# Patient Record
Sex: Female | Born: 1945 | Race: Black or African American | Hispanic: No | Smoking: Former smoker
Health system: Southern US, Community
[De-identification: ages and names within clinical notes are randomized; demographics above are authoritative.]

## PROBLEM LIST (undated history)

## (undated) DIAGNOSIS — I10 Essential (primary) hypertension: Secondary | ICD-10-CM

## (undated) DIAGNOSIS — N189 Chronic kidney disease, unspecified: Secondary | ICD-10-CM

## (undated) DIAGNOSIS — D374 Neoplasm of uncertain behavior of colon: Secondary | ICD-10-CM

## (undated) DIAGNOSIS — E876 Hypokalemia: Secondary | ICD-10-CM

## (undated) DIAGNOSIS — M25512 Pain in left shoulder: Secondary | ICD-10-CM

## (undated) DIAGNOSIS — R239 Unspecified skin changes: Secondary | ICD-10-CM

## (undated) DIAGNOSIS — J302 Other seasonal allergic rhinitis: Secondary | ICD-10-CM

## (undated) DIAGNOSIS — K069 Disorder of gingiva and edentulous alveolar ridge, unspecified: Secondary | ICD-10-CM

## (undated) DIAGNOSIS — E785 Hyperlipidemia, unspecified: Secondary | ICD-10-CM

## (undated) DIAGNOSIS — I517 Cardiomegaly: Secondary | ICD-10-CM

## (undated) DIAGNOSIS — K439 Ventral hernia without obstruction or gangrene: Secondary | ICD-10-CM

## (undated) HISTORY — DX: Ventral hernia without obstruction or gangrene: K43.9

## (undated) HISTORY — DX: Other seasonal allergic rhinitis: J30.2

## (undated) HISTORY — DX: Cardiomegaly: I51.7

## (undated) HISTORY — DX: Hyperlipidemia, unspecified: E78.5

## (undated) HISTORY — PX: HEMORRHOID SURGERY: SHX153

## (undated) HISTORY — DX: Hypokalemia: E87.6

## (undated) HISTORY — PX: TONSILLECTOMY: SUR1361

## (undated) HISTORY — DX: Unspecified skin changes: R23.9

## (undated) HISTORY — PX: APPENDECTOMY: SHX54

## (undated) HISTORY — PX: TUBAL LIGATION: SHX77

## (undated) HISTORY — DX: Chronic kidney disease, unspecified: N18.9

## (undated) HISTORY — DX: Neoplasm of uncertain behavior of colon: D37.4

## (undated) HISTORY — DX: Essential (primary) hypertension: I10

---
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
## 1999-09-08 ENCOUNTER — Other Ambulatory Visit: Admission: RE | Admit: 1999-09-08 | Discharge: 1999-09-08 | Payer: Self-pay | Admitting: Internal Medicine

## 1999-10-27 ENCOUNTER — Ambulatory Visit (HOSPITAL_COMMUNITY): Admission: RE | Admit: 1999-10-27 | Discharge: 1999-10-27 | Payer: Self-pay | Admitting: Internal Medicine

## 1999-10-27 ENCOUNTER — Encounter: Payer: Self-pay | Admitting: Internal Medicine

## 2000-05-27 ENCOUNTER — Encounter: Payer: Self-pay | Admitting: Emergency Medicine

## 2000-05-27 ENCOUNTER — Emergency Department (HOSPITAL_COMMUNITY): Admission: EM | Admit: 2000-05-27 | Discharge: 2000-05-27 | Payer: Self-pay | Admitting: Emergency Medicine

## 2001-04-26 ENCOUNTER — Ambulatory Visit (HOSPITAL_COMMUNITY): Admission: RE | Admit: 2001-04-26 | Discharge: 2001-04-26 | Payer: Self-pay | Admitting: Internal Medicine

## 2001-04-26 ENCOUNTER — Encounter: Payer: Self-pay | Admitting: Internal Medicine

## 2001-05-03 ENCOUNTER — Encounter: Payer: Self-pay | Admitting: Internal Medicine

## 2001-05-03 ENCOUNTER — Encounter: Admission: RE | Admit: 2001-05-03 | Discharge: 2001-05-03 | Payer: Self-pay | Admitting: Internal Medicine

## 2001-06-01 ENCOUNTER — Ambulatory Visit (HOSPITAL_COMMUNITY): Admission: RE | Admit: 2001-06-01 | Discharge: 2001-06-01 | Payer: Self-pay | Admitting: Gastroenterology

## 2001-11-02 ENCOUNTER — Encounter: Admission: RE | Admit: 2001-11-02 | Discharge: 2001-11-02 | Payer: Self-pay | Admitting: Internal Medicine

## 2001-11-02 ENCOUNTER — Encounter: Payer: Self-pay | Admitting: Internal Medicine

## 2002-05-16 ENCOUNTER — Encounter: Admission: RE | Admit: 2002-05-16 | Discharge: 2002-05-16 | Payer: Self-pay | Admitting: Internal Medicine

## 2002-05-16 ENCOUNTER — Encounter: Payer: Self-pay | Admitting: Internal Medicine

## 2003-07-03 ENCOUNTER — Encounter: Admission: RE | Admit: 2003-07-03 | Discharge: 2003-07-03 | Payer: Self-pay | Admitting: Internal Medicine

## 2006-05-20 DIAGNOSIS — I517 Cardiomegaly: Secondary | ICD-10-CM | POA: Insufficient documentation

## 2006-05-20 HISTORY — DX: Cardiomegaly: I51.7

## 2006-05-28 ENCOUNTER — Ambulatory Visit: Payer: Self-pay | Admitting: Internal Medicine

## 2006-05-28 ENCOUNTER — Encounter (INDEPENDENT_AMBULATORY_CARE_PROVIDER_SITE_OTHER): Payer: Self-pay | Admitting: *Deleted

## 2006-05-28 ENCOUNTER — Ambulatory Visit (HOSPITAL_COMMUNITY): Admission: RE | Admit: 2006-05-28 | Discharge: 2006-05-28 | Payer: Self-pay | Admitting: Internal Medicine

## 2006-05-28 LAB — CONVERTED CEMR LAB
ALT: 15 units/L (ref 0–35)
BUN: 10 mg/dL (ref 6–23)
Free T4: 1.18 ng/dL (ref 0.89–1.80)
Glucose, Bld: 99 mg/dL (ref 70–99)
Hgb urine dipstick: NEGATIVE
Ketones, ur: 15 mg/dL — AB
Leukocyte count, blood: 4.7 10*9/L (ref 3.7–10.0)
MCV: 85.9 fL (ref 78.8–100.0)
Nitrite: NEGATIVE
Potassium: 3.7 meq/L (ref 3.5–5.3)
RBC: 4.76 M/uL (ref 3.79–4.96)
RDW: 14.4 % (ref 11.5–15.3)
TSH: 0.831 microintl units/mL (ref 0.350–5.50)
Total Protein: 6.9 g/dL (ref 6.0–8.3)
Urine Glucose: NEGATIVE mg/dL
Urobilinogen, UA: 0.2 (ref 0.0–1.0)

## 2006-06-02 ENCOUNTER — Encounter (INDEPENDENT_AMBULATORY_CARE_PROVIDER_SITE_OTHER): Payer: Self-pay | Admitting: *Deleted

## 2006-06-02 ENCOUNTER — Ambulatory Visit: Payer: Self-pay | Admitting: Hospitalist

## 2006-06-02 LAB — CONVERTED CEMR LAB
Total CHOL/HDL Ratio: 3.2
VLDL: 11 mg/dL (ref 0–40)

## 2006-06-04 DIAGNOSIS — I517 Cardiomegaly: Secondary | ICD-10-CM | POA: Insufficient documentation

## 2006-06-04 DIAGNOSIS — J301 Allergic rhinitis due to pollen: Secondary | ICD-10-CM | POA: Insufficient documentation

## 2006-06-04 DIAGNOSIS — I10 Essential (primary) hypertension: Secondary | ICD-10-CM | POA: Insufficient documentation

## 2006-06-14 ENCOUNTER — Ambulatory Visit: Payer: Self-pay | Admitting: Hospitalist

## 2006-06-14 ENCOUNTER — Encounter (INDEPENDENT_AMBULATORY_CARE_PROVIDER_SITE_OTHER): Payer: Self-pay | Admitting: *Deleted

## 2006-06-14 LAB — CONVERTED CEMR LAB
BUN: 12 mg/dL (ref 6–23)
Calcium: 9 mg/dL (ref 8.4–10.5)
Chloride: 103 meq/L (ref 96–112)
Creatinine, Ser: 1.1 mg/dL (ref 0.40–1.20)
Glucose, Bld: 112 mg/dL — ABNORMAL HIGH (ref 70–99)

## 2006-06-21 ENCOUNTER — Encounter (INDEPENDENT_AMBULATORY_CARE_PROVIDER_SITE_OTHER): Payer: Self-pay | Admitting: *Deleted

## 2006-06-21 ENCOUNTER — Ambulatory Visit: Payer: Self-pay | Admitting: Internal Medicine

## 2006-06-21 LAB — CONVERTED CEMR LAB
BUN: 16 mg/dL (ref 6–23)
Calcium: 8.8 mg/dL (ref 8.4–10.5)
Creatinine, Ser: 1.1 mg/dL (ref 0.40–1.20)
Glucose, Bld: 102 mg/dL — ABNORMAL HIGH (ref 70–99)
Sodium: 142 meq/L (ref 135–145)

## 2006-06-24 ENCOUNTER — Encounter: Admission: RE | Admit: 2006-06-24 | Discharge: 2006-06-24 | Payer: Self-pay | Admitting: *Deleted

## 2006-07-07 ENCOUNTER — Encounter (INDEPENDENT_AMBULATORY_CARE_PROVIDER_SITE_OTHER): Payer: Self-pay | Admitting: *Deleted

## 2006-07-07 ENCOUNTER — Ambulatory Visit: Payer: Self-pay | Admitting: *Deleted

## 2006-07-07 LAB — CONVERTED CEMR LAB: CO2: 32 meq/L (ref 19–32)

## 2006-07-26 ENCOUNTER — Encounter: Admission: RE | Admit: 2006-07-26 | Discharge: 2006-07-26 | Payer: Self-pay | Admitting: Gastroenterology

## 2006-08-03 ENCOUNTER — Telehealth: Payer: Self-pay | Admitting: *Deleted

## 2006-08-09 ENCOUNTER — Encounter: Payer: Self-pay | Admitting: *Deleted

## 2006-08-09 ENCOUNTER — Encounter (INDEPENDENT_AMBULATORY_CARE_PROVIDER_SITE_OTHER): Payer: Self-pay | Admitting: *Deleted

## 2006-08-09 ENCOUNTER — Ambulatory Visit: Payer: Self-pay | Admitting: Internal Medicine

## 2006-08-09 LAB — CONVERTED CEMR LAB
BUN: 11 mg/dL (ref 6–23)
Calcium: 9.1 mg/dL (ref 8.4–10.5)
Creatinine, Ser: 0.94 mg/dL (ref 0.40–1.20)
Glucose, Bld: 84 mg/dL (ref 70–99)
Potassium: 3.8 meq/L (ref 3.5–5.3)

## 2006-08-10 ENCOUNTER — Telehealth (INDEPENDENT_AMBULATORY_CARE_PROVIDER_SITE_OTHER): Payer: Self-pay | Admitting: *Deleted

## 2006-08-20 HISTORY — PX: RIGHT COLECTOMY: SHX853

## 2006-08-20 HISTORY — PX: COLON SURGERY: SHX602

## 2006-09-08 ENCOUNTER — Ambulatory Visit: Payer: Self-pay | Admitting: Internal Medicine

## 2006-09-09 ENCOUNTER — Encounter (INDEPENDENT_AMBULATORY_CARE_PROVIDER_SITE_OTHER): Payer: Self-pay | Admitting: Specialist

## 2006-09-09 ENCOUNTER — Inpatient Hospital Stay (HOSPITAL_COMMUNITY): Admission: RE | Admit: 2006-09-09 | Discharge: 2006-09-14 | Payer: Self-pay | Admitting: General Surgery

## 2006-09-09 DIAGNOSIS — Z860100 Personal history of colon polyps, unspecified: Secondary | ICD-10-CM | POA: Insufficient documentation

## 2006-09-09 DIAGNOSIS — Z9889 Other specified postprocedural states: Secondary | ICD-10-CM | POA: Insufficient documentation

## 2006-09-17 ENCOUNTER — Telehealth: Payer: Self-pay | Admitting: *Deleted

## 2006-09-27 ENCOUNTER — Encounter (INDEPENDENT_AMBULATORY_CARE_PROVIDER_SITE_OTHER): Payer: Self-pay | Admitting: *Deleted

## 2006-10-25 ENCOUNTER — Ambulatory Visit: Payer: Self-pay | Admitting: Internal Medicine

## 2006-10-25 ENCOUNTER — Encounter (INDEPENDENT_AMBULATORY_CARE_PROVIDER_SITE_OTHER): Payer: Self-pay | Admitting: *Deleted

## 2007-01-24 ENCOUNTER — Encounter (INDEPENDENT_AMBULATORY_CARE_PROVIDER_SITE_OTHER): Payer: Self-pay | Admitting: *Deleted

## 2007-01-24 ENCOUNTER — Ambulatory Visit: Payer: Self-pay | Admitting: Hospitalist

## 2007-01-24 LAB — CONVERTED CEMR LAB
CO2: 31 meq/L (ref 19–32)
Chloride: 99 meq/L (ref 96–112)
Creatinine, Ser: 0.95 mg/dL (ref 0.40–1.20)
Glucose, Bld: 107 mg/dL — ABNORMAL HIGH (ref 70–99)
Potassium: 3.5 meq/L (ref 3.5–5.3)

## 2007-06-24 ENCOUNTER — Encounter (INDEPENDENT_AMBULATORY_CARE_PROVIDER_SITE_OTHER): Payer: Self-pay | Admitting: *Deleted

## 2007-06-24 ENCOUNTER — Ambulatory Visit: Payer: Self-pay | Admitting: Hospitalist

## 2007-06-24 DIAGNOSIS — E785 Hyperlipidemia, unspecified: Secondary | ICD-10-CM

## 2007-06-24 DIAGNOSIS — E1169 Type 2 diabetes mellitus with other specified complication: Secondary | ICD-10-CM | POA: Insufficient documentation

## 2007-06-24 LAB — CONVERTED CEMR LAB
BUN: 22 mg/dL
CO2: 28 meq/L
Calcium: 9.2 mg/dL
Chloride: 103 meq/L
Creatinine, Ser: 1.08 mg/dL
Glucose, Bld: 89 mg/dL
Potassium: 3.8 meq/L
Sodium: 143 meq/L

## 2007-07-15 ENCOUNTER — Encounter: Admission: RE | Admit: 2007-07-15 | Discharge: 2007-07-15 | Payer: Self-pay | Admitting: Internal Medicine

## 2008-02-02 ENCOUNTER — Telehealth (INDEPENDENT_AMBULATORY_CARE_PROVIDER_SITE_OTHER): Payer: Self-pay | Admitting: *Deleted

## 2008-06-07 ENCOUNTER — Encounter (INDEPENDENT_AMBULATORY_CARE_PROVIDER_SITE_OTHER): Payer: Self-pay | Admitting: *Deleted

## 2008-06-07 ENCOUNTER — Ambulatory Visit: Payer: Self-pay | Admitting: Internal Medicine

## 2008-06-08 LAB — CONVERTED CEMR LAB
ALT: 12 units/L (ref 0–35)
Albumin: 4.4 g/dL (ref 3.5–5.2)
Alkaline Phosphatase: 70 units/L (ref 39–117)
CO2: 29 meq/L (ref 19–32)
Cholesterol: 204 mg/dL — ABNORMAL HIGH (ref 0–200)
HDL: 58 mg/dL (ref >39)
LDL Cholesterol: 127 mg/dL — ABNORMAL HIGH (ref 0–99)
Potassium: 4 meq/L (ref 3.5–5.3)
Triglycerides: 95 mg/dL (ref <150)

## 2008-06-28 ENCOUNTER — Encounter (INDEPENDENT_AMBULATORY_CARE_PROVIDER_SITE_OTHER): Payer: Self-pay | Admitting: *Deleted

## 2008-07-09 ENCOUNTER — Telehealth: Payer: Self-pay | Admitting: Infectious Diseases

## 2008-07-16 ENCOUNTER — Encounter: Admission: RE | Admit: 2008-07-16 | Discharge: 2008-07-16 | Payer: Self-pay | Admitting: *Deleted

## 2008-07-18 ENCOUNTER — Telehealth (INDEPENDENT_AMBULATORY_CARE_PROVIDER_SITE_OTHER): Payer: Self-pay | Admitting: *Deleted

## 2008-08-20 HISTORY — PX: VENTRAL HERNIA REPAIR: SHX424

## 2008-08-31 ENCOUNTER — Inpatient Hospital Stay (HOSPITAL_COMMUNITY): Admission: RE | Admit: 2008-08-31 | Discharge: 2008-09-03 | Payer: Self-pay | Admitting: General Surgery

## 2008-09-18 ENCOUNTER — Encounter (INDEPENDENT_AMBULATORY_CARE_PROVIDER_SITE_OTHER): Payer: Self-pay | Admitting: *Deleted

## 2008-09-26 DIAGNOSIS — K432 Incisional hernia without obstruction or gangrene: Secondary | ICD-10-CM | POA: Insufficient documentation

## 2008-10-16 ENCOUNTER — Encounter (INDEPENDENT_AMBULATORY_CARE_PROVIDER_SITE_OTHER): Payer: Self-pay | Admitting: *Deleted

## 2009-02-21 ENCOUNTER — Telehealth: Payer: Self-pay | Admitting: *Deleted

## 2009-03-27 ENCOUNTER — Telehealth (INDEPENDENT_AMBULATORY_CARE_PROVIDER_SITE_OTHER): Payer: Self-pay | Admitting: *Deleted

## 2009-04-10 ENCOUNTER — Ambulatory Visit: Payer: Self-pay | Admitting: Internal Medicine

## 2009-04-13 DIAGNOSIS — R944 Abnormal results of kidney function studies: Secondary | ICD-10-CM | POA: Insufficient documentation

## 2009-04-13 LAB — CONVERTED CEMR LAB
BUN: 20 mg/dL (ref 6–23)
CO2: 30 meq/L (ref 19–32)
Calcium: 10 mg/dL (ref 8.4–10.5)
Chloride: 101 meq/L (ref 96–112)
Cholesterol: 205 mg/dL — ABNORMAL HIGH (ref 0–200)
Creatinine, Ser: 1.34 mg/dL — ABNORMAL HIGH (ref 0.40–1.20)
Glucose, Bld: 167 mg/dL — ABNORMAL HIGH (ref 70–99)
Sodium: 143 meq/L (ref 135–145)
Triglycerides: 114 mg/dL (ref <150)
VLDL: 23 mg/dL (ref 0–40)

## 2009-04-18 ENCOUNTER — Ambulatory Visit: Payer: Self-pay | Admitting: Internal Medicine

## 2009-04-18 DIAGNOSIS — E119 Type 2 diabetes mellitus without complications: Secondary | ICD-10-CM | POA: Insufficient documentation

## 2009-04-18 LAB — CONVERTED CEMR LAB
CO2: 28 meq/L (ref 19–32)
Calcium: 8.9 mg/dL (ref 8.4–10.5)
Chloride: 101 meq/L (ref 96–112)
Creatinine, Ser: 1.13 mg/dL (ref 0.40–1.20)
Glucose, Bld: 201 mg/dL — ABNORMAL HIGH (ref 70–99)

## 2009-04-19 ENCOUNTER — Ambulatory Visit: Payer: Self-pay | Admitting: Internal Medicine

## 2009-04-19 LAB — CONVERTED CEMR LAB
Creatinine, Urine: 162.4 mg/dL
Microalb Creat Ratio: 58.7 mg/g — ABNORMAL HIGH (ref 0.0–30.0)
Microalb, Ur: 9.54 mg/dL — ABNORMAL HIGH (ref 0.00–1.89)

## 2009-05-10 ENCOUNTER — Ambulatory Visit: Payer: Self-pay | Admitting: Internal Medicine

## 2009-06-11 ENCOUNTER — Ambulatory Visit: Payer: Self-pay | Admitting: Infectious Disease

## 2009-06-11 LAB — CONVERTED CEMR LAB: Hgb A1c MFr Bld: 7.8 %

## 2009-06-12 LAB — CONVERTED CEMR LAB
AST: 15 units/L (ref 0–37)
Alkaline Phosphatase: 62 units/L (ref 39–117)
BUN: 18 mg/dL (ref 6–23)
CO2: 27 meq/L (ref 19–32)
Calcium: 9.4 mg/dL (ref 8.4–10.5)
Creatinine, Ser: 1.17 mg/dL (ref 0.40–1.20)
Glucose, Bld: 148 mg/dL — ABNORMAL HIGH (ref 70–99)
Potassium: 3.5 meq/L (ref 3.5–5.3)
Total Bilirubin: 0.3 mg/dL (ref 0.3–1.2)

## 2009-06-18 ENCOUNTER — Encounter (INDEPENDENT_AMBULATORY_CARE_PROVIDER_SITE_OTHER): Payer: Self-pay | Admitting: Dermatology

## 2009-06-19 ENCOUNTER — Encounter (INDEPENDENT_AMBULATORY_CARE_PROVIDER_SITE_OTHER): Payer: Self-pay | Admitting: Dermatology

## 2009-07-17 ENCOUNTER — Encounter: Admission: RE | Admit: 2009-07-17 | Discharge: 2009-07-17 | Payer: Self-pay | Admitting: Dermatology

## 2009-07-24 ENCOUNTER — Encounter: Admission: RE | Admit: 2009-07-24 | Discharge: 2009-07-24 | Payer: Self-pay | Admitting: Dermatology

## 2009-07-24 ENCOUNTER — Encounter (INDEPENDENT_AMBULATORY_CARE_PROVIDER_SITE_OTHER): Payer: Self-pay | Admitting: Dermatology

## 2009-08-01 ENCOUNTER — Encounter (INDEPENDENT_AMBULATORY_CARE_PROVIDER_SITE_OTHER): Payer: Self-pay | Admitting: Dermatology

## 2009-08-15 LAB — HM DIABETES EYE EXAM

## 2009-08-16 ENCOUNTER — Ambulatory Visit: Payer: Self-pay | Admitting: Internal Medicine

## 2009-08-16 LAB — CONVERTED CEMR LAB
ALT: 13 units/L (ref 0–35)
Alkaline Phosphatase: 76 units/L (ref 39–117)
Blood Glucose, Fingerstick: 115
CO2: 27 meq/L (ref 19–32)
Chloride: 97 meq/L (ref 96–112)
Creatinine, Ser: 1.14 mg/dL (ref 0.40–1.20)
Glucose, Bld: 127 mg/dL — ABNORMAL HIGH (ref 70–99)
Total Protein: 8 g/dL (ref 6.0–8.3)

## 2009-08-26 ENCOUNTER — Encounter (INDEPENDENT_AMBULATORY_CARE_PROVIDER_SITE_OTHER): Payer: Self-pay | Admitting: Dermatology

## 2009-08-26 ENCOUNTER — Ambulatory Visit: Payer: Self-pay | Admitting: Internal Medicine

## 2009-08-26 LAB — CONVERTED CEMR LAB: Blood Glucose, Home Monitor: 1 mg/dL

## 2009-08-27 LAB — CONVERTED CEMR LAB
Calcium: 9.7 mg/dL (ref 8.4–10.5)
Chloride: 100 meq/L (ref 96–112)
Sodium: 143 meq/L (ref 135–145)

## 2009-08-29 ENCOUNTER — Encounter (INDEPENDENT_AMBULATORY_CARE_PROVIDER_SITE_OTHER): Payer: Self-pay | Admitting: Dermatology

## 2009-09-02 ENCOUNTER — Encounter (INDEPENDENT_AMBULATORY_CARE_PROVIDER_SITE_OTHER): Payer: Self-pay | Admitting: Dermatology

## 2009-09-02 ENCOUNTER — Ambulatory Visit: Payer: Self-pay | Admitting: Internal Medicine

## 2009-09-02 LAB — CONVERTED CEMR LAB
CO2: 27 meq/L (ref 19–32)
Glucose, Bld: 153 mg/dL — ABNORMAL HIGH (ref 70–99)
Potassium: 3.9 meq/L (ref 3.5–5.3)

## 2009-09-11 ENCOUNTER — Ambulatory Visit: Payer: Self-pay | Admitting: Internal Medicine

## 2009-09-11 ENCOUNTER — Encounter (INDEPENDENT_AMBULATORY_CARE_PROVIDER_SITE_OTHER): Payer: Self-pay | Admitting: Dermatology

## 2009-09-13 LAB — CONVERTED CEMR LAB
Chloride: 102 meq/L (ref 96–112)
Creatinine, Ser: 1.15 mg/dL (ref 0.40–1.20)
Sodium: 143 meq/L (ref 135–145)

## 2009-09-24 ENCOUNTER — Encounter (INDEPENDENT_AMBULATORY_CARE_PROVIDER_SITE_OTHER): Payer: Self-pay | Admitting: Dermatology

## 2009-09-25 ENCOUNTER — Ambulatory Visit: Payer: Self-pay | Admitting: Internal Medicine

## 2009-10-16 ENCOUNTER — Encounter (INDEPENDENT_AMBULATORY_CARE_PROVIDER_SITE_OTHER): Payer: Self-pay | Admitting: Dermatology

## 2009-10-16 ENCOUNTER — Telehealth (INDEPENDENT_AMBULATORY_CARE_PROVIDER_SITE_OTHER): Payer: Self-pay | Admitting: *Deleted

## 2009-10-17 ENCOUNTER — Encounter (INDEPENDENT_AMBULATORY_CARE_PROVIDER_SITE_OTHER): Payer: Self-pay | Admitting: Dermatology

## 2009-11-04 ENCOUNTER — Encounter (INDEPENDENT_AMBULATORY_CARE_PROVIDER_SITE_OTHER): Payer: Self-pay | Admitting: Dermatology

## 2009-11-15 ENCOUNTER — Ambulatory Visit: Payer: Self-pay | Admitting: Internal Medicine

## 2009-11-15 LAB — CONVERTED CEMR LAB
Blood Glucose, Fingerstick: 78
Hgb A1c MFr Bld: 6.6 %

## 2009-12-24 ENCOUNTER — Ambulatory Visit: Payer: Self-pay | Admitting: Internal Medicine

## 2010-01-21 ENCOUNTER — Ambulatory Visit: Payer: Self-pay | Admitting: Internal Medicine

## 2010-01-23 ENCOUNTER — Ambulatory Visit: Payer: Self-pay | Admitting: Internal Medicine

## 2010-01-23 ENCOUNTER — Encounter: Payer: Self-pay | Admitting: Internal Medicine

## 2010-01-23 LAB — CONVERTED CEMR LAB
ALT: 15 units/L (ref 0–35)
CO2: 29 meq/L (ref 19–32)
Cholesterol: 134 mg/dL (ref 0–200)
Creatinine, Ser: 1.11 mg/dL (ref 0.40–1.20)
Glucose, Bld: 125 mg/dL — ABNORMAL HIGH (ref 70–99)
Potassium: 3.7 meq/L (ref 3.5–5.3)
Total CHOL/HDL Ratio: 2.3
Total Protein: 7.1 g/dL (ref 6.0–8.3)

## 2010-02-10 ENCOUNTER — Encounter: Payer: Self-pay | Admitting: Internal Medicine

## 2010-05-01 ENCOUNTER — Telehealth: Payer: Self-pay | Admitting: Internal Medicine

## 2010-05-01 ENCOUNTER — Ambulatory Visit: Payer: Self-pay | Admitting: Internal Medicine

## 2010-06-03 ENCOUNTER — Ambulatory Visit: Payer: Self-pay | Admitting: Internal Medicine

## 2010-06-03 LAB — HM DIABETES FOOT EXAM

## 2010-06-03 LAB — CONVERTED CEMR LAB: Blood Glucose, Fingerstick: 96

## 2010-06-06 ENCOUNTER — Encounter: Payer: Self-pay | Admitting: Internal Medicine

## 2010-07-18 ENCOUNTER — Encounter
Admission: RE | Admit: 2010-07-18 | Discharge: 2010-07-18 | Payer: Self-pay | Source: Home / Self Care | Attending: Internal Medicine | Admitting: Internal Medicine

## 2010-07-18 LAB — HM MAMMOGRAPHY

## 2010-08-06 LAB — HM DIABETES EYE EXAM

## 2010-08-21 NOTE — Letter (Signed)
Summary: BLOOD SUGAR   BLOOD SUGAR   Imported By: Garlan Fillers 12/25/2009 11:47:48  _____________________________________________________________________  External Attachment:    Type:   Image     Comment:   External Document

## 2010-08-21 NOTE — Miscellaneous (Signed)
Summary: [facility]: Diabetes Testing Supplies  [facility]: Diabetes Testing Supplies   Imported By: Bonner Puna 08/29/2009 16:03:20  _____________________________________________________________________  External Attachment:    Type:   Image     Comment:   External Document

## 2010-08-21 NOTE — Consult Note (Signed)
Summary: [facility]: Diabetic Eye Exam  [facility]: Diabetic Eye Exam   Imported By: Bonner Puna 08/15/2009 12:03:29  _____________________________________________________________________  External Attachment:    Type:   Image     Comment:   External Document  Appended Document: [facility]: Diabetic Eye Exam    Clinical Lists Changes  Observations: Added new observation of DIAB EYE EX: No diabetic retinopathy.   Visual acuity OD (best corrected):     20/25 Visual acuity OS (best corrected):     20/20 Intraocular pressure OD:     16 Intraocular pressure OS:     14  (08/15/2009 13:31)    Diabetic Eye Exam  Procedure date:  08/15/2009  Findings:      No diabetic retinopathy.   Visual acuity OD (best corrected):     20/25 Visual acuity OS (best corrected):     20/20 Intraocular pressure OD:     16 Intraocular pressure OS:     14

## 2010-08-21 NOTE — Assessment & Plan Note (Signed)
Summary: RA/NEEDS ROUTINE CHECKUP/CH   Vital Signs:  Patient profile:   65 year old female Height:      64 inches (162.56 cm) Weight:      188.2 pounds (85.55 kg) BMI:     32.42 Temp:     98.1 degrees F (36.72 degrees C) oral Pulse rate:   81 / minute BP sitting:   130 / 90  (right arm)  Vitals Entered By: Mateo Flow Deborra Medina) (January 21, 2010 1:55 PM) CC: 33mth f/u Is Patient Diabetic? Yes Did you bring your meter with you today? Yes Research Study Name: pt no longer checking regularly Pain Assessment Patient in pain? no      Nutritional Status BMI of > 30 = obese  Have you ever been in a relationship where you felt threatened, hurt or afraid?No   Does patient need assistance? Functional Status Self care Ambulation Normal   Diabetic Foot Exam Foot Inspection Is there a history of a foot ulcer?              No Is there a foot ulcer now?              No Can the patient see the bottom of their feet?          Yes Are the shoes appropriate in style and fit?          No Is there swelling or an abnormal foot shape?          No Are the toenails long?                No Are the toenails thick?                Yes Are the toenails ingrown?              No Is there heavy callous build-up?              No  Diabetic Foot Care Education Patient educated on appropriate care of diabetic feet.   High Risk Feet? No   10-g (5.07) Semmes-Weinstein Monofilament Test Performed by: Adline Peals          Right Foot          Left Foot Site 1         normal         normal Site 2         normal         normal Site 3         normal         normal Site 4         normal         normal Site 5         normal         normal Site 6         normal         normal  Impression      normal         normal   Visit Type:  ofc  Primary Care Provider:  Sheral Apley MD  CC:  74mth f/u.  History of Present Illness: Pt is a 66yo AAF with DM, HTN, and HL here for a routine 3 mo f/u. She was last  seen in clinic on 10/26/09 by Dr. Albin Fischer.          Preventive Screening-Counseling & Management  Alcohol-Tobacco     Alcohol drinks/day: 0  Smoking Status: quit     Year Started: 2001     Year Quit: 2001  Allergies: 1)  ! * Pineapple 2)  ! * White Gold  Physical Exam  General:  alert, well-developed, well-nourished, and well-hydrated.   Head:  normocephalic and atraumatic.   Eyes:  vision grossly intact, pupils equal, pupils round, and pupils reactive to light.   Ears:  no external deformities.   Nose:  no external deformity.   Neck:  no cervical lymphadenopathy.   Lungs:  normal respiratory effort, normal breath sounds, no crackles, and no wheezes.   Heart:  normal rate, regular rhythm, no murmur, no gallop, and no rub.   Abdomen:  soft, non-tender, normal bowel sounds, no distention, no guarding, and no rigidity.   Msk:  No deformity or scoliosis noted of thoracic or lumbar spine.   Pulses:  R and L carotid,radial,femoral,dorsalis pedis and posterior tibial pulses are full and equal bilaterally Extremities:  No clubbing, cyanosis, edema, or deformity noted with normal full range of motion of all joints.   Neurologic:  No cranial nerve deficits noted. Station and gait are normal. Plantar reflexes are down-going bilaterally. DTRs are symmetrical throughout. Sensory, motor and coordinative functions appear intact. Psych:  Cognition and judgment appear intact. Alert and cooperative with normal attention span and concentration. No apparent delusions, illusions, hallucinations  Diabetes Management Exam:    Foot Exam (with socks and/or shoes not present):       Sensory-Monofilament:          Left foot: normal          Right foot: normal   Impression & Recommendations:  Problem # 1:  DM (ICD-250.00)  DM: She is not on any medications currently and has opted instead to try lifestyle modifications. She is making significant progress with her diet and exercise regimen.  She  has been checking her blood glucose levels once per week usually in the mornings after exercise and getting values between 100-130.    Her updated medication list for this problem includes:    Lisinopril 40 Mg Tabs (Lisinopril) .Marland Kitchen... Take 1 tablet by mouth once a day    Aspirin 81 Mg Tbec (Aspirin) .Marland Kitchen... Take 1 tablet by mouth once a day  Future Orders: T-Comprehensive Metabolic Panel ([id number]) ... 01/23/2010  Labs Reviewed: Creat: 1.15 (09/11/2009)     Last Eye Exam: No diabetic retinopathy.   Visual acuity OD (best corrected):     20/25 Visual acuity OS (best corrected):     20/20 Intraocular pressure OD:     16 Intraocular pressure OS:     14  (08/15/2009) Reviewed HgBA1c results: 6.6 (11/15/2009)  7.8 (08/16/2009)  Problem # 2:  HYPERTENSION (ICD-401.9)  HTN: This is well controlled on HCTZ. She continues to be compliant with her meds with no complaints of blurry vision or LE swelling.  Her updated medication list for this problem includes:    Hydrochlorothiazide 25 Mg Tabs (Hydrochlorothiazide) .Marland Kitchen... Take 1 tablet by mouth once a day    Lisinopril 40 Mg Tabs (Lisinopril) .Marland Kitchen... Take 1 tablet by mouth once a day    Norvasc 10 Mg Tabs (Amlodipine besylate) .Marland Kitchen... Take 1 tablet by mouth once a day  BP today: 130/90 Prior BP: 128/81 (11/15/2009)  Prior 10 Yr Risk Heart Disease: 9 % (09/08/2006)  Labs Reviewed: K+: 3.6 (09/11/2009) Creat: : 1.15 (09/11/2009)   Chol: 205 (04/10/2009)   HDL: 49 (04/10/2009)   LDL: 133 (04/10/2009)   TG: 114 (  04/10/2009)  Problem # 3:  HYPERLIPIDEMIA (ICD-272.2)  HL: Well controlled on Simvastatin.  Her updated medication list for this problem includes:    Simvastatin 20 Mg Tabs (Simvastatin) .Marland Kitchen... Please take one tablet by mouth each evening.  Future Orders: T-Lipid Profile [phone]) ... 01/29/2010  Labs Reviewed: SGOT: 14 (08/16/2009)   SGPT: 13 (08/16/2009)  Prior 10 Yr Risk Heart Disease: 9 % (09/08/2006)   HDL:49  (04/10/2009), 58 (06/07/2008)  LDL:133 (04/10/2009), 127 (06/07/2008)  Chol:205 (04/10/2009), 204 (06/07/2008)  Trig:114 (04/10/2009), 95 (06/07/2008)  Problem # 4:  NONSPECIFIC ABNORM RESULTS KIDNEY FUNCTION STUDY (ICD-794.4) Renal Insufficiency: per records, patient seemed to have had occassional creatinine elevations from baseline.  Complete Medication List: 1)  Hydrochlorothiazide 25 Mg Tabs (Hydrochlorothiazide) .... Take 1 tablet by mouth once a day 2)  Lisinopril 40 Mg Tabs (Lisinopril) .... Take 1 tablet by mouth once a day 3)  Norvasc 10 Mg Tabs (Amlodipine besylate) .... Take 1 tablet by mouth once a day 4)  Align Caps (Misc intestinal flora regulat) .... Take 1 tablet by mouth once a day 5)  Debrox 6.5 % Soln (Carbamide peroxide) .... Use in ears as needed for earwax buildup. 6)  Fexofenadine Hcl 180 Mg Tabs (Fexofenadine hcl) .... Take 1 tablet by mouth once a day 7)  Simvastatin 20 Mg Tabs (Simvastatin) .... Please take one tablet by mouth each evening. 8)  Estate manager/land agent Strp (Glucose blood) .... Use to check blood sugar as needed to learn how foods and activity affect blood sugar. 9)  Lancets 30g Misc (Lancets) .... Use to check blood sugar as needed. 10)  Aspirin 81 Mg Tbec (Aspirin) .... Take 1 tablet by mouth once a day  Patient Instructions: 1)  Please continue with your diet and exercise regimen. Pls make an appointment to check your fasting labs. Also pls make a followup clinic appointment in 3 months. 2)  Call with any questions. Process Orders Check Orders Results:     [facility]: Check successful Tests Sent for requisitioning (January 28, 2010 10:20 AM):     01/23/2010: [facility] -- T-Comprehensive Metabolic Panel [id number] (signed)     01/29/2010: [facility] -- T-Lipid Profile [phone] (signed)    Prevention & Chronic Care Immunizations   Influenza vaccine: Fluvax Non-MCR  (04/10/2009)    Tetanus  booster: Not documented   Td booster deferral: Deferred  (08/16/2009)    Pneumococcal vaccine: Pneumovax  (06/07/2008)    H. zoster vaccine: Not documented   H. zoster vaccine deferral: Deferred  (04/18/2009)  Colorectal Screening   Hemoccult: Not documented   Hemoccult action/deferral: Deferred  (04/18/2009)    Colonoscopy: Not documented   Colonoscopy action/deferral: Deferred  (08/16/2009)  Other Screening   Pap smear: Not documented   Pap smear action/deferral: Deferred  (08/16/2009)    Mammogram: Repeat of Right breast: The mammographic nodule corresponds to a simple cyst.  No     mammographic or sonographic evidence of malignancy.  Yearly     screening mammography is suggested.  (07/24/2009)   Mammogram action/deferral: Ordered  (04/10/2009)   Mammogram due: 07/16/2009    DXA bone density scan: Not documented   DXA bone density action/deferral: Deferred  (04/18/2009)   Smoking status: quit  (01/21/2010)  Diabetes Mellitus   HgbA1C: 6.6  (11/15/2009)    Eye exam: No diabetic retinopathy.   Visual acuity OD (best corrected):     20/25 Visual acuity OS (best corrected):     20/20 Intraocular  pressure OD:     16 Intraocular pressure OS:     14   (08/15/2009)   Eye exam due: 08/15/2010    Foot exam: yes  (01/21/2010)   Foot exam action/deferral: Do today   High risk foot: No  (01/21/2010)   Foot care education: Done  (01/21/2010)    Urine microalbumin/creatinine ratio: 58.7  (04/18/2009)   Urine microalbumin action/deferral: Ordered    Diabetes flowsheet reviewed?: Yes   Progress toward A1C goal: At goal  Lipids   Total Cholesterol: 205  (04/10/2009)   Lipid panel action/deferral: Deferred   LDL: 133  (04/10/2009)   LDL Direct: Not documented   HDL: 49  (04/10/2009)   Triglycerides: 114  (04/10/2009)    SGOT (AST): 14  (08/16/2009)   SGPT (ALT): 13  (08/16/2009) CMP ordered    Alkaline phosphatase: 76  (08/16/2009)   Total bilirubin: 0.4   (08/16/2009)  Hypertension   Last Blood Pressure: 130 / 90  (01/21/2010)   Serum creatinine: 1.15  (09/11/2009)   Serum potassium 3.6  (09/11/2009) CMP ordered   Self-Management Support :   Personal Goals (by the next clinic visit) :     Personal A1C goal: 7  (04/18/2009)     Personal blood pressure goal: 130/80  (04/18/2009)     Personal LDL goal: 100  (04/18/2009)    Patient will work on the following items until the next clinic visit to reach self-care goals:     Medications and monitoring: take my medicines every day  (01/21/2010)     Eating: eat foods that are low in salt, eat baked foods instead of fried foods  (01/21/2010)     Activity: take a 30 minute walk every day  (11/15/2009)     Other: continue with your walking program  (01/21/2010)    Diabetes self-management support: Copy of home glucose meter record, Written self-care plan, Pre-printed educational material, Resources for patients handout  (01/21/2010)   Diabetes care plan printed   Last diabetes self-management training by diabetes educator: 12/24/2009    Hypertension self-management support: Pre-printed educational material, Resources for patients handout, Written self-care plan  (01/21/2010)   Hypertension self-care plan printed.    Lipid self-management support: Pre-printed educational material, Resources for patients handout, Written self-care plan  (01/21/2010)   Lipid self-care plan printed.      Resource handout printed.   Nursing Instructions: Diabetic foot exam today

## 2010-08-21 NOTE — Progress Notes (Signed)
Summary: med refill/gp  Phone Note Refill Request Message from:  Patient on May 01, 2010 1:51 PM  Refills Requested: Medication #1:  HYDROCHLOROTHIAZIDE 25 MG TABS Take 1 tablet by mouth once a day  Medication #2:  LISINOPRIL 40 MG TABS Take 1 tablet by mouth once a day  Medication #3:  NORVASC 10 MG  TABS Take 1 tablet by mouth once a day Last appt 7/5  w/labs 7/7.  Next appt. 11/15.   Method Requested: Telephone to Pharmacy Initial call taken by: Morrison Old RN,  May 01, 2010 1:51 PM  Follow-up for Phone Call        Has F/U appt scheduled. BP mod control Follow-up by: Larey Dresser MD,  May 01, 2010 1:59 PM    Prescriptions: NORVASC 10 MG  TABS (AMLODIPINE BESYLATE) Take 1 tablet by mouth once a day  #30 x 5   Entered and Authorized by:   Larey Dresser MD   Signed by:   Larey Dresser MD on 05/01/2010   Method used:   Faxed to ...       [facility] (mail-order)             , [place]         Ph:        Fax: [phone]   [id number] LISINOPRIL 40 MG TABS (LISINOPRIL) Take 1 tablet by mouth once a day  #30 x 5   Entered and Authorized by:   Larey Dresser MD   Signed by:   Larey Dresser MD on 05/01/2010   Method used:   Faxed to ...       [facility] (mail-order)             , [place]         Ph:        Fax: [phone]   [id number]:[id number] HYDROCHLOROTHIAZIDE 25 MG TABS (HYDROCHLOROTHIAZIDE) Take 1 tablet by mouth once a day  #31 x 5   Entered and Authorized by:   Larey Dresser MD   Signed by:   Larey Dresser MD on 05/01/2010   Method used:   Faxed to ...       [facility] (mail-order)             , [place]         Ph:        Fax: [phone]   RxID:   [id number]

## 2010-08-21 NOTE — Letter (Signed)
Summary: [facility] BLOOD GLU[facility]SE LOG  [facility]   Imported By: Garlan Fillers 11/27/2009 15:28:46  _____________________________________________________________________  External Attachment:    Type:   Image     Comment:   External Document

## 2010-08-21 NOTE — Assessment & Plan Note (Signed)
Summary: DM TRAINING/VS   Allergies: 1)  ! * Pineapple 2)  ! * White Gold   Complete Medication List: 1)  Hydrochlorothiazide 25 Mg Tabs (Hydrochlorothiazide) .... Take 1 tablet by mouth once a day 2)  Lisinopril 40 Mg Tabs (Lisinopril) .... Take 1 tablet by mouth once a day 3)  Norvasc 10 Mg Tabs (Amlodipine besylate) .... Take 1 tablet by mouth once a day 4)  Align Caps (Misc intestinal flora regulat) .... Take 1 tablet by mouth once a day 5)  Debrox 6.5 % Soln (Carbamide peroxide) .... Use in ears as needed for earwax buildup. 6)  Fexofenadine Hcl 180 Mg Tabs (Fexofenadine hcl) .... Take 1 tablet by mouth once a day 7)  Simvastatin 20 Mg Tabs (Simvastatin) .... Please take one tablet by mouth each evening. 8)  Estate manager/land agent Strp (Glucose blood) .... Use to check blood sugar as needed to learn how foods and activity affect blood sugar. 9)  Lancets 30g Misc (Lancets) .... Use to check blood sugar as needed. 10)  Aspirin 81 Mg Tbec (Aspirin) .... Take 1 tablet by mouth once a day  Other Orders: MNT/Reassessment and Intervention, Individual, 15 Minutes [phone])  Patient Instructions: 1)  you said your goal weight is 140-145# Long term 2)  5 pounds  every 3 months= 20 pounds a year!!! 3)  You are doing a great job caring for yourself nad your diabetes! 4)  Next A1C is due late -after July 29th, 2011. 5)  Please make a follow up for your diabetes self management training for September 2011.   Diabetes Self Management Training  PCP: Harriett Sine MD Referring MD: Cathren Laine Date diagnosed with diabetes: 04/18/2009 Diabetes Type: Type 2 non-insulin Other persons present: yes Current smoking Status: quit  Diabetes Medications:  Comments: meter downloaded- see scanned report. CBGs continued to be in target range off medication. CBG 100 today in office.     Monitoring Self monitoring blood glucose 1 time a day Name of Meter  usb conbtour self test  Other Assessment Had an  amputation due to diabetes? No Ever had a foot ulcer or infection?           No Performs daily self-foot exams: Yes   Carrys Food for Low Blood sugar Yes Can you tell if your blood sugar is low? Yes   Diabetes Disease Process  Discussed today Define diabetes in simple terms: Demonstrates competency    Medications  Nutritional Management Identify what foods most often affect blood glucose: Demonstrates competency Monitoring State purpose and frequency of monitoring BG-ketones-HgbA1C  : Demonstrates competencyPerform glucose monitoring/ketone testing and record results correctly: Demonstrates competencyState target blood glucose and HgbA1C goals: Demonstrates competency Complications State the causes-signs and symptoms and prevention of Hyperglycemia: Demonstrates competency   Explain proper treatment of hyperglycemia: Demonstrates competency   State the causes- signs and symptoms and prevention of hypoglycemia: Not applicable   Explain proper treatment of hypoglycemia: Not applicable   Glucose control and the development/prevention of long-term complications: Demonstrates competencyState benefits-risks-and options for improving blood sugar control: Demonstrates competencyB/P and lipid control in the prevention/control of cardiovascular disease: Demonstrates competencyState the principles of skin-dental and foot care: Demonstrates competencyDescribe symptoms of skin and foot problems and describe foot exam: Demonstrates competencyState when to seek medical advice and treatment: Demonstrates competency Exercise  Lifestyle changes:Goal setting and Problem solving State benefits of making appropriate lifestyle changes: Demonstrates competencyIdentify lifestyle behaviors that need to change: Demonstrates competencyVerbalize need for and frequency of health care  follow-up: Demonstrates competency Psychosocial Adjustment Name two ways of obtaining support from family/friends: Demonstrates  competencyDiabetes Management Education Done: 12/24/2009        MEDICAL NUTRITION THERAPY  Assessment: patient target weight is 140-145#. she verbalizes knowledge and confidence in her ability to continue weight loss.Thinking about joining an exercise club/program for group classes.   [id number] is continued downward  Medications:aspirin, simvastatin Exercise: 5-6 times a week for 30 minutes or more Labs:A1C 6.6%   Diagnosis:  NB 2.1- physical inactivity resolved. NB 53.3 - inappropriate intake of types of carbohydrate resolved NB 1.4 self monitoring deifict significanlty improved as evidenced by meter download- see scanned report. Duran 2.2- altered nutrition related laboratory values as related to increased risk of heart diseas and stroke as evidenced by 03/2009 LDL value of 133.    Intervention:  1- Discussed using weight and A1C with periodic self monitoring as needed for symptons as means of self monitoring.  2- Counseled on causes, sign and symptoms of hyperglycemia and how to prevent and treat. 3- meter download, food records and goals reveiwed. 4- Information on Silver Sneakers provided 5- follow-up on repeat lipids in fall.  Monitoring:verbalizing good understanding of how to eat low fat and lower carb, weight improved Evaluation:A1C at goal, weight continues to fall at good rate: 1/2-1 pound/week  Diabetes Self Management Support: family ,CDE and clinic appointments Follow-up:3 months for yearly, then eveyr 6 -12 months   Appended Document: DM TRAINING/VS     Vital Signs:  Patient profile:   65 year old female Weight:      182.9 pounds BMI:     31.51  Allergies: 1)  ! * Pineapple 2)  ! * White Gold

## 2010-08-21 NOTE — Progress Notes (Signed)
Summary: diabetes support & testing /dmr  Phone Note Outgoing Call   Call placed by: Barnabas Harries RD,CDE,  October 16, 2009 11:30 AM Summary of Call: called patient to confirm number of times she is testing to fax mail order form for her to get supplies. she has been out of strips due to increased testing frequency. says she test 2x/day to see how her food affects her blood sugar. requests list of carb content of begetables and meats- Tired of [place] and chicken and eating a lot of vegetables. Also reveiwed number of carbs./day she should aim for  ~150grams- 50 grams per meaL. information mailed ot her about carbs.    New/Updated Medications: BAYER CONTOUR TEST  STRP (GLUCOSE BLOOD) use to check blood sugar 2  times daily before and 2 hours after meals LANCETS 30G  MISC (LANCETS) use to check blood sugar 2  times daily

## 2010-08-21 NOTE — Letter (Signed)
Summary: CONTOUR METER DOWNLOAD  CONTOUR METER DOWNLOAD   Imported By: Enedina Finner 02/10/2010 15:41:18  _____________________________________________________________________  External Attachment:    Type:   Image     Comment:   External Document

## 2010-08-21 NOTE — Assessment & Plan Note (Signed)
Summary: lab visit   Process Orders Check Orders Results:     [facility]: Check successful Tests Sent for requisitioning (September 03, 2009 11:30 AM):     09/02/2009: [facility] -- T-Basic Metabolic Panel 0000000 (signed)     Impression & Recommendations:  Problem # 1:  NONSPECIFIC ABNORM RESULTS KIDNEY FUNCTION STUDY (ICD-794.4) Renal function returned to baseline. Will have her restart HCTZ and Lisinopril and return for Bmet in 1-2 weeks to make sure Creatinine still at baseline.   Orders: T-Basic Metabolic Panel ([id number])  Problem # 2:  DM (ICD-250.00) At last visit I had discussed starting this patient on metformin. Now that her Cr has hit 1.3 twice now in the past few months, I am reluctant to use metformin and will start low dose glipizide instead.   The following medications were removed from the medication list:    Metformin Hcl 500 Mg Tabs (Metformin hcl) .Marland Kitchen... Take 1 tablet by mouth twice a day Her updated medication list for this problem includes:    Lisinopril 40 Mg Tabs (Lisinopril) .Marland Kitchen... Take 1 tablet by mouth once a day    Glipizide 5 Mg Tabs (Glipizide) .Marland Kitchen... Take 1 tablet by mouth once a day with a meal. make sure you take it with a meal.  Complete Medication List: 1)  Hydrochlorothiazide 25 Mg Tabs (Hydrochlorothiazide) .... Take 1 tablet by mouth once a day 2)  Lisinopril 40 Mg Tabs (Lisinopril) .... Take 1 tablet by mouth once a day 3)  Norvasc 10 Mg Tabs (Amlodipine besylate) .... Take 1 tablet by mouth once a day 4)  Align Caps (Misc intestinal flora regulat) .... Take 1 tablet by mouth once a day 5)  Debrox 6.5 % Soln (Carbamide peroxide) .... Use in ears as needed for earwax buildup. 6)  Fexofenadine Hcl 180 Mg Tabs (Fexofenadine hcl) .... Take 1 tablet by mouth once a day 7)  Simvastatin 20 Mg Tabs (Simvastatin) .... Please take one tablet by mouth each evening. 8)  Glipizide 5 Mg Tabs (Glipizide) .... Take 1  tablet by mouth once a day with a meal. make sure you take it with a meal.  Allergies: 1)  ! * Pineapple 2)  ! * White Gold  Prescriptions: GLIPIZIDE 5 MG TABS (GLIPIZIDE) Take 1 tablet by mouth once a day with a meal. Make sure you take it with a meal.  #31 x 3   Entered by:   Harriett Sine MD   Authorized by:   Gevena Cotton RN   Signed by:   Harriett Sine MD on 09/03/2009   Method used:   Faxed to ...       [facility] (mail-order)             , [place]         Ph:        Fax: [phone]   [id number]   Appended Document: lab visit    Clinical Lists Changes  Medications: Rx of GLIPIZIDE 5 MG TABS (GLIPIZIDE) Take 1 tablet by mouth once a day with a meal. Make sure you take it with a meal.;  #31 x 3;  Signed;  Entered by: Harriett Sine MD;  Authorized by: Harriett Sine MD;  Method used: Faxed to [place], , , [place]  , Ph: , Fax: [phone]    Prescriptions: GLIPIZIDE 5 MG TABS (GLIPIZIDE) Take 1 tablet by mouth once a day with a meal. Make sure you take it with a  meal.  #31 x 3   Entered and Authorized by:   Harriett Sine MD   Signed by:   Harriett Sine MD on 09/03/2009   Method used:   Faxed to ...       [facility] (mail-order)             , [place]         Ph:        Fax: [phone]   RxID:   [id number]

## 2010-08-21 NOTE — Assessment & Plan Note (Signed)
Summary: diabetes teaching/cfb   Allergies: 1)  ! * Pineapple 2)  ! * White Gold   Complete Medication List: 1)  Hydrochlorothiazide 25 Mg Tabs (Hydrochlorothiazide) .... Take 1 tablet by mouth once a day 2)  Lisinopril 40 Mg Tabs (Lisinopril) .... Take 1 tablet by mouth once a day 3)  Norvasc 10 Mg Tabs (Amlodipine besylate) .... Take 1 tablet by mouth once a day 4)  Align Caps (Misc intestinal flora regulat) .... Take 1 tablet by mouth once a day 5)  Debrox 6.5 % Soln (Carbamide peroxide) .... Use in ears as needed for earwax buildup. 6)  Fexofenadine Hcl 180 Mg Tabs (Fexofenadine hcl) .... Take 1 tablet by mouth once a day 7)  Simvastatin 20 Mg Tabs (Simvastatin) .... Please take one tablet by mouth each evening.  Other Orders: DSMT(Medicare) Individual, 30 Minutes (G0108)  Diabetes Self Management Training  PCP: Harriett Sine MD Referring MD: Cathren Laine Date diagnosed with diabetes: 04/18/2009 Diabetes Type: Type 2 non-insulin Other persons present: no Current smoking Status: quit  Diabetes Medications:  Comments: discussed self monitoirng with patient. she was hesitant. disucssed that supplies should be coverred at 100% and she could collect infomration that might help her make decisions about what foods to eat, see how exercise affects CBgs. patietn chose Contour USB meter adn will get supplies through [facility]. referral faxed today. asked her to test 6-7 times for 3 days then call me with results    Monitoring Self monitoring blood glucose 1 time a day Name of Meter  usb conbtour self test  Time of Testing  Before meals  After meals  Recent Episodes of: Requiring Help from another person  Hyperglycemia : Yes Hypoglycemia: No Severe Hypoglycemia : No     Diabetes Disease Process  Discussed today  Medications  Nutritional Management  Monitoring State purpose and frequency of monitoring BG-ketones-HgbA1C  : Needs review/assistance   Perform glucose  monitoring/ketone testing and record results correctly: Needs review/assistance    State target blood glucose and HgbA1C goals: Needs review/assistance   Diabetes Management Education Done: 08/26/2009    BEHAVIORAL GOALS INITIAL Monitoring blood glucose levels daily: test CBg before and 2 hours afte meals for 3 days and then call office        MEDICAL NUTRITION THERAPY  Assessment: has decreased to 1% milk, met her goal 100% of not eating poultry skin, 25% onusing tub margarine instead of stick  butter, weight has decreased another pound in the past 2 months. Not waking every day.  [id number] is continued downward  Medications:none for diabetes, simvastatin Blood sugars:A1C still hihg despite additional dietary and continued modest weight change 24 hour recall: cheerios with 1% milk @ 5:30 am, 1 egg nad wheat toast @ 9:30 am, 1 Pm- sadwich or baked fish, brown rice, cabbage, honey in her tea x 2 tsp, dinner 5:30 Pm- roast beef, broccoli,mashed potatoes and tea again with honey as sweetner. Exercise:says because of snow and ice didn;t get out as much as she'd like- 3- 5 times a week for 30 minutes Labs:note continued high A1C   Diagnosis:  NB 2.1- physical activity improving. need to reassess patient desire to increase her physical activty to meet her goal of not using medications to control her blood sugars  NB 53.3 - improvong, but may need further detailed records to determine if further change neededor desired  NB1.4 self monitroing deifict as related to lack of awareness of problem that is preventing her from reachin gher goals as  evidenced by her high A1C today and continueing to state she wants improved control and not to take medications.    Intervention:  1-Recommended self monitoring of blood sugars at home to detect when sugar is higher than target 2- Counseled on self monitoring 3- Counseled on sweetners to assit wiht lowering carbs  Monitoring:understanding of how to  eat low fat and lower carb, weight Evaluation:A1C, weight trend  Diabetes Self Management Support: family ,CDE and clinic appointments Follow-up:had recommended 3 months for monitoirng of MNT however in view of continued elevated A1C receommend return sooner for DSMT for self monitoring.

## 2010-08-21 NOTE — Assessment & Plan Note (Signed)
Summary: DM TRAINING/VS   Vital Signs:  Patient profile:   65 year old female Weight:      188.3 pounds BMI:     32.44 Is Patient Diabetic? Yes Did you bring your meter with you today? Yes   Allergies: 1)  ! * Pineapple 2)  ! * White Gold   Complete Medication List: 1)  Hydrochlorothiazide 25 Mg Tabs (Hydrochlorothiazide) .... Take 1 tablet by mouth once a day 2)  Lisinopril 40 Mg Tabs (Lisinopril) .... Take 1 tablet by mouth once a day 3)  Norvasc 10 Mg Tabs (Amlodipine besylate) .... Take 1 tablet by mouth once a day 4)  Align Caps (Misc intestinal flora regulat) .... Take 1 tablet by mouth once a day 5)  Debrox 6.5 % Soln (Carbamide peroxide) .... Use in ears as needed for earwax buildup. 6)  Fexofenadine Hcl 180 Mg Tabs (Fexofenadine hcl) .... Take 1 tablet by mouth once a day 7)  Simvastatin 20 Mg Tabs (Simvastatin) .... Please take one tablet by mouth each evening. 8)  Glipizide 5 Mg Tabs (Glipizide) .... Take 1 tablet by mouth once a day with a meal. make sure you take it with a meal. 9)  Bayer Contour Test Strp (Glucose blood) .... Use to check blood sugar 3-4 times daily before and 2 hours after meals 10)  Lancets 30g Misc (Lancets) .... Use to check blood sugar 3-4 times daily  Other Orders: DSMT(Medicare) Individual, 30 Minutes (G0108) MNT/Reassessment and Intervention, Individual, 15 Minutes [phone])  Patient Instructions: 1)  follow up in 3 months with [name] 2)  Watch for symptoms of low blood sugar 3)  If more than 2-3 low blood sugar ( < 0) in a week and you don't know what caused them, please give [place] a call- your diabetes medication dose may need to be adjusted 4)  [id number]  Diabetes Self Management Training  PCP: Harriett Sine MD Referring MD: Cathren Laine Date diagnosed with diabetes: 04/18/2009 Diabetes Type: Type 2 non-insulin Other persons present: no Current smoking Status: quit  Vital Signs Todays Weight: 188.3lb  in BMI 32.44in-lbs    Assessment Daily activities: active with five grandchildren since retired from [employer] in [place] for 30 years) last spring after abdominal surgery. Stopped all other activitiesd because doctors told her to start back slow.  Sources of Support: family  Diabetes Medications:  Comments: brought meter and food and blood sugar records- is testing more often now to see how food and actiivty affect blood sugar, desires post meal bloodsugar to be less than 140 mg/dl.over the past month  minimum blood sugar was 67- maximun has been 228testing 3.3 times a daybefore meal average is 104 and after meal, 90-95% in target range. weight continues to fall.      Diabetes Management Education Done: 09/25/2009    BEHAVIORAL GOAL FOLLOW UP  Goal attained  Goal attained  Goal attained Preventing,detecting and treating complications: 0000000 the time  Goal attained      MEDICAL NUTRITION THERAPY  Assessment:   [id number] is continued downward  Medications:glipizide 5 mg qam starte don 09/16/09, simvastatin Blood sugars:excellent with improvemtn after increase in self monitoirng and prior to her starting Glipizide 24 hour recall:  tea  with splenda or equal as sweetner. Exercise: 3- 5 times a week for 30 minutes Labs:none new since last visit   Diagnosis:  NB 2.1- physical activity improved as evidenced by patient report and being maintained. NB 53.3 - improved as evidenced by food  record and self monitoring NB1.4 self monitroing deifict significanlty improved as evidenced by meter download- see scanned report.    Intervention:  1- provided support nad encouragement 2- Counseled on causes, sign and symptoms of hypoglycemia and how to prevent and treat. 3- meter download, food records and goals reveiwed. 4- Recommend either a decrease or change in medication ot prevent hypoglycemia and impedence of her excellent progress with lifestyle change.    Monitoring:verbalizing good understanding of how to eat low fat and lower carb, weight improved Evaluation:A1C, weight trend is continued downward- will need to monitor need for decrease in medication ot contue support of healthier lifestyle could consider Januvia since no side effect of hypoglycemia or weight gain.  Diabetes Self Management Support: family ,CDE and clinic appointments Follow-up:had recommended 3 months for monitoirng of MNT however in view of continued elevated A1C receommend return sooner for DSMT for self monitoring.  Appended Document: DM TRAINING/VS Based on the recommendations above and the patient's progress with lifestyle modification, I will call and have this patient hold glipizide for now to see if she can reach goal without medication as the patient desires to do this without meds and has made progress prior to even starting the meds based on her glucose log data.   Appended Document: DM TRAINING/VS

## 2010-08-21 NOTE — Letter (Signed)
Summary: [facility]  [facility]   Imported By: Garlan Fillers 10/22/2009 11:38:40  _____________________________________________________________________  External Attachment:    Type:   Image     Comment:   External Document

## 2010-08-21 NOTE — Letter (Signed)
Summary: [facility]   Imported By: Bonner Puna 10/01/2009 09:49:43  _____________________________________________________________________  External Attachment:    Type:   Image     Comment:   External Document

## 2010-08-21 NOTE — Letter (Signed)
Summary: [facility]  [facility]   Imported By: Garlan Fillers 10/18/2009 10:41:29  _____________________________________________________________________  External Attachment:    Type:   Image     Comment:   External Document

## 2010-08-21 NOTE — Assessment & Plan Note (Signed)
Summary: 3 month appt/cfb   Vital Signs:  Patient profile:   65 year old female Weight:      195.7 pounds BMI:     33.71 CBG Result 115 CBG Device ID usb conbtour self test Comments 5 hours after eating breakfast this am at 5:30 am  Allergies: 1)  ! * Pineapple 2)  ! * White Gold   Complete Medication List: 1)  Hydrochlorothiazide 25 Mg Tabs (Hydrochlorothiazide) .... Take 1 tablet by mouth once a day 2)  Lisinopril 40 Mg Tabs (Lisinopril) .... Take 1 tablet by mouth once a day 3)  Norvasc 10 Mg Tabs (Amlodipine besylate) .... Take 1 tablet by mouth once a day 4)  Align Caps (Misc intestinal flora regulat) .... Take 1 tablet by mouth once a day 5)  Debrox 6.5 % Soln (Carbamide peroxide) .... Use in ears as needed for earwax buildup. 6)  Fexofenadine Hcl 180 Mg Tabs (Fexofenadine hcl) .... Take 1 tablet by mouth once a day 7)  Simvastatin 20 Mg Tabs (Simvastatin) .... Please take one tablet by mouth each evening.  Other Orders: DSMT(Medicare) Individual, 30 Minutes (G0108)  Diabetes Self Management Training  PCP: Harriett Sine MD Referring MD: Cathren Laine Date diagnosed with diabetes: 04/18/2009 Diabetes Type: Type 2 non-insulin Other persons present: no Current smoking Status: quit  Vital Signs Todays Weight: 195.7lb  in BMI 33.71in-lbs   Diabetes Medications:  Comments: no meds not monitoring- self tested again today with Contour meter to "practice"    Monitoring Self monitoring blood glucose Never Name of Meter  usb conbtour self test     Diabetes Disease Process  Discussed today  Medications  Nutritional Management State changes planned for home meals/snacks: Demonstrates competency    Monitoring State purpose and frequency of monitoring BG-ketones-HgbA1C  : Needs review/assistance   Perform glucose monitoring/ketone testing and record results correctly: Needs review/assistance    State target blood glucose and HgbA1C goals: Needs review/assistance      Complications Explain proper treatment of hyperglycemia: Needs review/assistance    Exercise States effect of exercise on blood glucose: Demonstrates competency   Diabetes Management Education Done: 08/16/2009    BEHAVIORAL GOAL FOLLOW UP Incorporating appropriate nutritional management: Most of the time      MEDICAL NUTRITION THERAPY  Assessment: has decreased to 1% milk, met her goal 100% of not eating poultry skin, 25% onusing tub margarine instead of stick  butter, weight has decreased another pound in the past 2 months. Not waking every day.  [id number] is continued downward  Medications:none for diabetes, simvastatin Blood sugars:A1C still hihg despite additional dietary and continued modest weight change 24 hour recall: cheerios with 1% milk @ 5:30 am, 1 egg nad wheat toast @ 9:30 am, 1 Pm- sadwich or baked fish, brown rice, cabbage, honey in her tea x 2 tsp, dinner 5:30 Pm- roast beef, broccoli,mashed potatoes and tea again with honey as sweetner. Exercise:says because of snow and ice didn;t get out as much as she'd like- 3- 5 times a week for 30 minutes Labs:note continued high A1C   Diagnosis:  NB 2.1- physical activity improving. need to reassess patient desire to increase her physical activty to meet her goal of not using medications to control her blood sugars  NB 53.3 - improvong, but may need further detailed records to determine if further change neededor desired  NB1.4 self monitroing deifict as related to lack of awareness of problem that is preventing her from reachin gher goals as evidenced by her  high A1C today and continueing to state she wants improved control and not to take medications.    Intervention:  1-Recommended self monitoring of blood sugars at home to detect when sugar is higher than target 2- Counseled on self monitoring 3- Counseled on sweetners to assit wiht lowering carbs  Monitoring:understanding of how to eat low fat and lower carb,  weight Evaluation:A1C, weight trend  Diabetes Self Management Support: family ,CDE and clinic appointments Follow-up:had recommended 3 months for monitoirng of MNT however in view of continued elevated A1C receommend return sooner for DSMT for self monitoring.

## 2010-08-21 NOTE — Assessment & Plan Note (Signed)
Summary: DM TEACHING/CH   Vital Signs:  Patient profile:   65 year old female Weight:      187.7 pounds BMI:     32.34 Is Patient Diabetic? Yes Did you bring your meter with you today? Yes   Allergies: 1)  ! * Pineapple 2)  ! * White Gold   Complete Medication List: 1)  Hydrochlorothiazide 25 Mg Tabs (Hydrochlorothiazide) .... Take 1 tablet by mouth once a day 2)  Lisinopril 40 Mg Tabs (Lisinopril) .... Take 1 tablet by mouth once a day 3)  Norvasc 10 Mg Tabs (Amlodipine besylate) .... Take 1 tablet by mouth once a day 4)  Align Caps (Misc intestinal flora regulat) .... Take 1 tablet by mouth once a day 5)  Debrox 6.5 % Soln (Carbamide peroxide) .... Use in ears as needed for earwax buildup. 6)  Fexofenadine Hcl 180 Mg Tabs (Fexofenadine hcl) .... Take 1 tablet by mouth once a day 7)  Simvastatin 20 Mg Tabs (Simvastatin) .... Please take one tablet by mouth each evening. 8)  Estate manager/land agent Strp (Glucose blood) .... Use to check blood sugar as needed up to 1 time a day. 9)  Lancets 30g Misc (Lancets) .... Use to check blood sugar as needed. 10)  Aspirin 81 Mg Tbec (Aspirin) .... Take 1 tablet by mouth once a day  Other Orders: MNT/Reassessment and Intervention, Individual, 15 Minutes [phone])   Orders Added: 1)  MNT/Reassessment and Intervention, Individual, 15 Minutes [id number]    Diabetes Self Management Training  PCP: Sheral Apley MD Referring MD: Cathren Laine Date diagnosed with diabetes: 04/18/2009 Diabetes Type: Type 2 non-insulin Other persons present: no Current smoking Status: quit  Vital Signs Todays Weight: 187.7lb  in BMI 32.34in-lbs   Diabetes Medications:  Comments: no meds, occassional check has been  145mg /     Diabetes Disease Process  Discussed today State diabetes is treated by meal plan-exercise-medication-monitoring-education: Demonstrates competency Medications  Nutritional Management Identify what foods most often affect blood glucose:  Needs review/assistance     Monitoring  Lifestyle changes:Goal setting and Problem solving List at least two appropriate community resources: Demonstrates competency          MEDICAL NUTRITION THERAPY  Assessment: Patient verbalizes haing a rough time with deaths in relatives and friends. Eating about the same, unable to be as active  Medications:aspirin, simvastatin, lisinopril Exercise: 3-5 times a week for 30 minutes or more Labs:A1C 6.9% today   Diagnosis:  NB 53.3 - inappropriate intake of types of carbohydrate resolved Murray 2.2- altered nutrition related laboratory values resolved with LDL at target   Intervention:  1- Reviewed what foods are carbs and why htis is important ot diabetes per patient request  2- Provided socailsupport, encouragemtn for continued weight loss efforts with decreased portions and physical activity  Monitoring:verbalizing good understanding of how to eat low fat and lower carb, weight improved Evaluation:A1C at goal, weight stable  Diabetes Self Management Support: family ,CDE and clinic appointments Follow-up: every 3 months for next year

## 2010-08-21 NOTE — Assessment & Plan Note (Signed)
Summary: FLU SHOT/DS  Nurse Visit   Allergies: 1)  ! * Pineapple 2)  ! * White Gold  Immunizations Administered:  Influenza Vaccine # 1:    Vaccine Type: Fluvax MCR    Site: left deltoid    Mfr: GlaxoSmithKline    Dose: 0.5 ml    Route: IM    Given by: Morrison Old RN    Exp. Date: 01/17/2011    Lot #: [id number]    VIS given: 02/11/10 version given May 01, 2010.  Flu Vaccine Consent Questions:    Do you have a history of severe allergic reactions to this vaccine? no    Any prior history of allergic reactions to egg and/or gelatin? no    Do you have a sensitivity to the preservative Thimersol? no    Do you have a past history of Guillan-Barre Syndrome? no    Do you currently have an acute febrile illness? no    Have you ever had a severe reaction to latex? no    Vaccine information given and explained to patient? yes    Are you currently pregnant? no  Orders Added: 1)  Influenza Vaccine MCR [00025]

## 2010-08-21 NOTE — Miscellaneous (Signed)
Summary: [facility]: Diabetes Supplies  [facility]: Diabetes Supplies   Imported By: Bonner Puna 08/27/2009 14:30:59  _____________________________________________________________________  External Attachment:    Type:   Image     Comment:   External Document

## 2010-08-21 NOTE — Assessment & Plan Note (Signed)
Summary: F/U/APPT/EST/VS   Vital Signs:  Patient profile:   65 year old female Height:      64 inches (162.56 cm) Weight:      188.3 pounds (85.59 kg) BMI:     32.44 Temp:     98.8 degrees F (37.11 degrees C) oral Pulse rate:   81 / minute BP sitting:   128 / 81  (right arm) Cuff size:   large  Vitals Entered By: Mateo Flow Deborra Medina) (November 15, 2009 2:09 PM) CC: routine f/u Is Patient Diabetic? Yes Did you bring your meter with you today? Yes-unable to download Research Study Name: computer download not in service Pain Assessment Patient in pain? no      Nutritional Status BMI of > 30 = obese CBG Result 78  Have you ever been in a relationship where you felt threatened, hurt or afraid?No   Does patient need assistance? Functional Status Self care Ambulation Normal   Primary Care Provider:  Harriett Sine MD  CC:  routine f/u.  History of Present Illness: 65 yo woman with PMH as listed below who presents for fu. I last saw her 08-26-2009.    DM: This patient had a new diagnosis of DMII in 03-2009 and elected to try lifestyle modification. At 05-2009 visit her A1c had decreased a small amount from 8 to 7.8, and we agreed that she could try lifestyle modification for a few more months to see if she could get at goal without adding medications. She did not quite meet goal with lifestyle modications, and I had her take glipizide for a short period of time. However, Barnabas Harries and the patient felt that the patient was making such good progress with lifestyle modication and self-monitoring of blood glucose that she was instructed to hold her glipizide until this appointment to see if she was at goal A1c.  She says that she has been walking a lot and feels that she is going down in clothes sizes and thinks that she is doing quite well.   Renal Insuff: This patient's baseline Cr seems to be  ~1.15 based on several measurements, but it has gone as high as 1.34 in the last  year.  Hyperlipidemia: LDL 133 in 03/2009 and simvastatin started 05/2009. LFTs WNL 05/2009 and 07/2009.  Preventive Health: Mammogram up to date. Uptodate on flu, pneumovax. Colonoscopies managed by Dr. Collene Mares and is uptodate per patient.   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 Take 1 Tablet By Mouth Once A Day 2)  Lisinopril 40 Mg Tabs (Lisinopril) .... Take 1 Tablet By Mouth Once A Day 3)  Norvasc 10 Mg  Tabs (Amlodipine Besylate) .... Take 1 Tablet By Mouth Once A Day 4)  Align  Caps (Misc Intestinal Flora Regulat) .... Take 1 Tablet By  Mouth Once A Day 5)  Debrox 6.5 % Soln (Carbamide Peroxide) .... Use in Ears As Needed For Earwax Buildup. 6)  Fexofenadine Hcl 180 Mg Tabs (Fexofenadine Hcl) .... Take 1 Tablet By Mouth Once A Day 7)  Simvastatin 20 Mg Tabs (Simvastatin) .... Please Take One Tablet By Mouth Each Evening. 8)  Bayer Contour Test  Strp (Glucose Blood) .... Use To Check Blood Sugar 2  Times Daily Before and 2 Hours After Meals 9)  Lancets 30g  Misc (Lancets) .... Use To Check Blood Sugar 2  Times Daily  Allergies: 1)  ! * Pineapple 2)  ! * White Gold  Past History:  Past Medical History: Last updated: 06/24/2007 Hypertension Left ventricular hypertrophy dx'd by ECG in  05/2006 Seasonal allergies Hypokalemia, recurrent  phone # at work: [employer])  Review of Systems  The patient denies anorexia, fever, weight gain, vision loss, decreased hearing, hoarseness, chest pain, syncope, dyspnea on exertion, peripheral edema, prolonged cough, headaches, hemoptysis, abdominal pain, melena, hematochezia, severe indigestion/heartburn, hematuria, incontinence, genital sores, muscle weakness, suspicious skin lesions, transient blindness, difficulty walking, depression, unusual weight change, abnormal bleeding, enlarged lymph nodes, angioedema, and breast masses.    Physical Exam  General:  alert, well-developed, well-nourished, and well-hydrated.   Head:  normocephalic and atraumatic.   Eyes:  vision grossly intact, pupils equal, pupils round, and pupils reactive to light.   Ears:  no external deformities.   Nose:  no external deformity.   Lungs:  normal respiratory effort, normal breath sounds, no crackles, and no wheezes.   Heart:  normal rate, regular rhythm, no murmur, no gallop, and no rub.   Abdomen:  soft, non-tender, normal bowel sounds, no distention, no guarding, and no rigidity.   Neurologic:  alert & oriented X3, cranial nerves II-XII intact, and strength normal in all extremities.   Psych:  Oriented X3, memory intact for recent and remote, normally interactive, good eye contact, not anxious appearing, not depressed appearing, not agitated, not suicidal, and not homicidal.     Impression & Recommendations:  Problem # 1:  DM (ICD-250.00) A1c decreased to 6.6 with lifestyle changes. This is an amazing result, and I have congratulated her on this. She will keep up her lifestyle changes/weight reduction program and will RTC in 3 months. I have informed her that she can stop checking her blood glucose at home.  She is up to date on eye exam, foot check, microalb/Cr.  I have added an 81MG  aspirin to her regimen as this was overlooked  before.   Her updated medication list for this problem includes:    Lisinopril 40 Mg Tabs (Lisinopril) .Marland Kitchen... Take 1 tablet by mouth once a day    Aspirin 81 Mg Tbec (Aspirin) .Marland Kitchen... Take 1 tablet by mouth once a day  Orders: T-Hgb A1C (in-house) [id number]) T- Capillary Blood Glucose [id number])  Problem # 2:  HYPERTENSION (ICD-401.9) At goal. Continue current regimen.  Her updated medication list for this problem includes:    Hydrochlorothiazide 25 Mg Tabs (Hydrochlorothiazide) .Marland Kitchen... Take 1 tablet by mouth once a day    Lisinopril 40 Mg Tabs (Lisinopril) .Marland Kitchen... Take 1 tablet by mouth once a day    Norvasc 10 Mg Tabs (Amlodipine besylate) .Marland Kitchen... Take 1 tablet by mouth once a day  Problem # 3:  Preventive Health Care (ICD-V70.0) Mammogram up to date. Uptodate on flu, pneumovax. Colonoscopies managed by Dr. Collene Mares and is uptodate per patient.  Complete Medication List: 1)  Hydrochlorothiazide 25  Mg Tabs (Hydrochlorothiazide) .... Take 1 tablet by mouth once a day 2)  Lisinopril 40 Mg Tabs (Lisinopril) .... Take 1 tablet by mouth once a day 3)  Norvasc 10 Mg Tabs (Amlodipine besylate) .... Take 1 tablet by mouth once a day 4)  Align Caps (Misc intestinal flora regulat) .... Take 1 tablet by mouth once a day 5)  Debrox 6.5 % Soln (Carbamide peroxide) .... Use in ears as needed for earwax buildup. 6)  Fexofenadine Hcl 180 Mg Tabs (Fexofenadine hcl) .... Take 1 tablet by mouth once a day 7)  Simvastatin 20 Mg Tabs (Simvastatin) .... Please take one tablet by mouth each evening. 8)  Estate manager/land agent Strp (Glucose blood) .... Use to check blood sugar 2  times daily before and 2 hours after meals 9)  Lancets 30g Misc (Lancets) .... Use to check blood sugar 2  times daily 10)  Aspirin 81 Mg Tbec (Aspirin) .... Take 1 tablet by mouth once a day  Patient Instructions: 1)  Please return to clinic in 3 months. 2)  You can stop checking your blood glucose at home. 3)  Notice that I have added an 81MG   aspirin to your medication regimen.  Prevention & Chronic Care Immunizations   Influenza vaccine: Fluvax Non-MCR  (04/10/2009)    Tetanus booster: Not documented   Td booster deferral: Deferred  (08/16/2009)    Pneumococcal vaccine: Pneumovax  (06/07/2008)    H. zoster vaccine: Not documented   H. zoster vaccine deferral: Deferred  (04/18/2009)  Colorectal Screening   Hemoccult: Not documented   Hemoccult action/deferral: Deferred  (04/18/2009)    Colonoscopy: Not documented   Colonoscopy action/deferral: Deferred  (08/16/2009)  Other Screening   Pap smear: Not documented   Pap smear action/deferral: Deferred  (08/16/2009)    Mammogram: Repeat of Right breast: The mammographic nodule corresponds to a simple cyst.  No     mammographic or sonographic evidence of malignancy.  Yearly     screening mammography is suggested.  (07/24/2009)   Mammogram action/deferral: Ordered  (04/10/2009)   Mammogram due: 07/16/2009    DXA bone density scan: Not documented   DXA bone density action/deferral: Deferred  (04/18/2009)   Smoking status: quit  (11/15/2009)  Diabetes Mellitus   HgbA1C: 6.6  (11/15/2009)    Eye exam: No diabetic retinopathy.   Visual acuity OD (best corrected):     20/25 Visual acuity OS (best corrected):     20/20 Intraocular pressure OD:     16 Intraocular pressure OS:     14   (08/15/2009)    Foot exam: yes  (06/11/2009)   Foot exam action/deferral: Do today   High risk foot: Not documented   Foot care education: Not documented    Urine microalbumin/creatinine ratio: 58.7  (04/18/2009)   Urine microalbumin action/deferral: Ordered    Diabetes flowsheet reviewed?: Yes   Progress toward A1C goal: At goal  Lipids   Total Cholesterol: 205  (04/10/2009)   Lipid panel action/deferral: Deferred   LDL: 133  (04/10/2009)   LDL Direct: Not documented   HDL: 49  (04/10/2009)   Triglycerides: 114  (04/10/2009)    SGOT (AST): 14  (08/16/2009)   SGPT (ALT):  13  (08/16/2009)   Alkaline phosphatase: 76  (08/16/2009)   Total bilirubin: 0.4  (08/16/2009)    Lipid flowsheet reviewed?: Yes   Progress toward LDL goal: Unchanged  Hypertension   Last Blood Pressure: 128 / 81  (11/15/2009)   Serum creatinine: 1.15  (  09/11/2009)   Serum potassium 3.6  (09/11/2009)    Hypertension flowsheet reviewed?: Yes   Progress toward BP goal: At goal  Self-Management Support :   Personal Goals (by the next clinic visit) :     Personal A1C goal: 7  (04/18/2009)     Personal blood pressure goal: 130/80  (04/18/2009)     Personal LDL goal: 100  (04/18/2009)    Patient will work on the following items until the next clinic visit to reach self-care goals:     Medications and monitoring: take my medicines every day, check my blood sugar  (11/15/2009)     Eating: drink diet soda or water instead of juice or soda, eat foods that are low in salt, eat baked foods instead of fried foods  (11/15/2009)     Activity: take a 30 minute walk every day  (11/15/2009)    Diabetes self-management support: Pre-printed educational material, Resources for patients handout  (11/15/2009)   Last diabetes self-management training by diabetes educator: 09/25/2009    Hypertension self-management support: Pre-printed educational material, Resources for patients handout  (11/15/2009)    Lipid self-management support: Pre-printed educational material, Resources for patients handout  (11/15/2009)        Resource handout printed.   Laboratory Results   Blood Tests   Date/Time Received: November 15, 2009 2:17 PM Date/Time Reported: Maryan Rued  November 15, 2009 2:17 PM   HGBA1C: 6.6%   (Normal Range: Non-Diabetic - 3-6%   Control Diabetic - 6-8%) CBG Random:: 78mg /dL

## 2010-08-21 NOTE — Assessment & Plan Note (Signed)
Summary: EST-CK/FU/MEDS/CFB   Vital Signs:  Patient profile:   65 year old female Height:      64 inches Weight:      195.7 pounds BMI:     33.71 Temp:     97.2 degrees F oral Pulse rate:   84 / minute BP sitting:   138 / 94  (right arm)  Vitals Entered By: Silverio Decamp NT II (August 16, 2009 10:54 AM) CC: FOLLOW-UP APPOINTMENT Is Patient Diabetic? Yes Did you bring your meter with you today? No Pain Assessment Patient in pain? no      Nutritional Status BMI of > 30 = obese CBG Result 115  Does patient need assistance? Functional Status Self care Ambulation Normal   Primary Care Provider:  Harriett Sine MD  CC:  FOLLOW-UP APPOINTMENT.  History of Present Illness: 65 yo woman with PMH as listed below who presents for regular fu. I last saw her 06-11-2009.  Hyperlipidemia: Started statin last visit. No problems.  DM: This patient had a new diagnosis of DMII in 03-2009 and elected to try lifestyle modification. At her last visit her A1c had decreased a small amount from 8  to 7.8, and we agreed that she could try lifestyle modification for a few more months to see if she could get at goal without adding medications. Today she returns for a recheck of her A1c. She is still walking, although less because of the weather. She is eating healthier and even keeps a food diary. She met with Barnabas Harries today. She is still motivated to try lifestyle changes rather than starting any medications for diabetes.   Preventive Health: Mammogram up to date. Uptodate on flu, pneumovax. Colonoscopies managed by Dr. Collene Mares and is uptodate per patient.  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 Take 1 Tablet By Mouth Once A Day 2)  Lisinopril 40 Mg Tabs (Lisinopril) .... Take 1 Tablet By Mouth Once A Day 3)  Norvasc 10 Mg  Tabs (Amlodipine Besylate) .... Take 1 Tablet By Mouth Once A Day 4)  Align  Caps (Misc Intestinal Flora Regulat) .... Take 1 Tablet By Mouth Once A Day 5)  Debrox 6.5 % Soln (Carbamide Peroxide) .... Use in Ears As Needed For Earwax Buildup. 6)  Fexofenadine Hcl 180 Mg Tabs (Fexofenadine Hcl) .... Take 1 Tablet By Mouth Once A Day 7)  Simvastatin 20 Mg Tabs (Simvastatin) .... Please Take One Tablet By Mouth Each Evening.  Allergies (verified): 1)  ! * Pineapple 2)  ! * White Gold  Review of Systems  The patient denies anorexia, fever, weight gain, vision loss, decreased hearing, hoarseness, chest pain, syncope, dyspnea on exertion, peripheral edema, prolonged cough, headaches, hemoptysis, abdominal pain, melena,  hematochezia, severe indigestion/heartburn, hematuria, incontinence, genital sores, muscle weakness, suspicious skin lesions, transient blindness, difficulty walking, depression, unusual weight change, abnormal bleeding, enlarged lymph nodes, and angioedema.    Physical Exam  General:  alert, well-developed, well-nourished, and well-hydrated.   Head:  normocephalic and atraumatic.   Eyes:  vision grossly intact, pupils equal, pupils round, and pupils reactive to light.   Ears:  no external deformities.   Nose:  no external deformity.   Lungs:  normal respiratory effort, no accessory muscle use, normal breath sounds, no  crackles, and no wheezes.   Heart:  normal rate, regular rhythm, no murmur, no gallop, and no rub.   Abdomen:  soft, non-tender, and normal bowel sounds.   Neurologic:  alert & oriented X3, cranial nerves II-XII intact, strength normal in all extremities, and sensation intact to light touch.   Psych:  Oriented X3, memory intact for recent and remote, normally interactive, good eye contact, not anxious appearing, and not depressed appearing.   Additional Exam:  Manual BP in room left upper arm: 120/85.    Impression & Recommendations:  Problem # 1:  DM (ICD-250.00) Patient has lost another pound since last visit. She is walking less due to the weather but seems very motivated. She is eating healthier and even keeps a food diary. She is doing all the right things in an effort to avoid taking medications for her diabetes.  A1c today is 7.8. This means that she is still not at goal after  ~4 months. This could be due in part to the fact that the weather has slowed down her walking routine some, but I would still elect to start the patient on metformin.   **I spoke with the patient on the phone and she is reluctant to start metformin at this time. I have asked that she come in  ~1 week to meet with Barnabas Harries and to have another regular clinic visit to discuss her options as she was not ready to start metformin based on our phone discussion but did agree to come into clinic to discuss this further.   Her updated medication list for this problem includes:    Lisinopril 40 Mg Tabs (Lisinopril) .Marland Kitchen... Take 1 tablet by mouth once a day  Orders: T-Hgb A1C (in-house) [id number]) T-Capillary Blood Glucose [id number]) T-Comprehensive Metabolic Panel ([id number])  Problem # 2:  HYPERLIPIDEMIA (ICD-272.2) Started statin last visit. Checking LFTs today. Patient should have lipid panel at a future visit in a few months.   Her updated medication list for this problem includes:    Simvastatin 20 Mg Tabs  (Simvastatin) .Marland Kitchen... Please take one tablet by mouth each evening.  Orders: T-Comprehensive Metabolic Panel ([id number])  Problem # 3:  HYPERTENSION (ICD-401.9) My recheck in room 120/85. At goal. Continue current therapy.  Her updated medication list for this problem includes:    Hydrochlorothiazide 25 Mg Tabs (Hydrochlorothiazide) .Marland Kitchen... Take 1 tablet by mouth once a day    Lisinopril 40 Mg Tabs (Lisinopril) .Marland Kitchen... Take 1 tablet by mouth once a day    Norvasc 10 Mg Tabs (Amlodipine besylate) .Marland Kitchen... Take 1 tablet by mouth once a day  Orders: T-Comprehensive Metabolic Panel ([id number])  Problem # 4:  HYPOKALEMIA (ICD-276.8) Labs from this office visit show K+ 3.4. Labs over last few checks have been borderline low. I am asking that she come in for an appt in  ~1 week to further discuss starting metformin, and  a Bmet can be checked at that time. If  she is still low K+ supplementation can be started at that time.   Complete Medication List: 1)  Hydrochlorothiazide 25 Mg Tabs (Hydrochlorothiazide) .... Take 1 tablet by mouth once a day 2)  Lisinopril 40 Mg Tabs (Lisinopril) .... Take 1 tablet by mouth once a day 3)  Norvasc 10 Mg Tabs (Amlodipine besylate) .... Take 1 tablet by mouth once a day 4)  Align Caps (Misc intestinal flora regulat) .... Take 1 tablet by mouth once a day 5)  Debrox 6.5 % Soln (Carbamide peroxide) .... Use in ears as needed for earwax buildup. 6)  Fexofenadine Hcl 180 Mg Tabs (Fexofenadine hcl) .... Take 1 tablet by mouth once a day 7)  Simvastatin 20 Mg Tabs (Simvastatin) .... Please take one tablet by mouth each evening.  Patient Instructions: 1)  Please schedule a regular followup appointment in 3 months. 2)  Please also make an appointment to see Barnabas Harries in 3 months. 3)  I will call you with your lab results.  Prescriptions: SIMVASTATIN 20 MG TABS (SIMVASTATIN) Please take one tablet by mouth each evening.  #31 x 5   Entered and Authorized by:   Harriett Sine MD   Signed by:   Harriett Sine MD on 08/16/2009   Method used:   Faxed to ...       [facility] (mail-order)             , [place]         Ph:        Fax: [phone]   [id number] FEXOFENADINE HCL 180 MG TABS (FEXOFENADINE HCL) Take 1 tablet by mouth once a day  #31 x 5   Entered and Authorized by:   Harriett Sine MD   Signed by:   Harriett Sine MD on 08/16/2009   Method used:   Faxed to ...       [employer])             , [place]         Ph:        Fax: [phone]   RxID:   [id number]   Prevention & Chronic Care Immunizations   Influenza vaccine: Fluvax Non-MCR  (04/10/2009)    Tetanus booster: Not documented   Td booster deferral: Deferred  (08/16/2009)    Pneumococcal vaccine: Pneumovax  (06/07/2008)    H. zoster vaccine: Not documented   H. zoster vaccine deferral: Deferred  (04/18/2009)  Colorectal Screening   Hemoccult: Not documented   Hemoccult action/deferral: Deferred  (04/18/2009)    Colonoscopy: Not documented   Colonoscopy action/deferral: Deferred  (08/16/2009)  Other Screening   Pap smear: Not documented   Pap smear action/deferral: Deferred  (08/16/2009)    Mammogram: Repeat of Right breast: The mammographic nodule corresponds to a simple cyst.  No     mammographic or sonographic evidence of malignancy.  Yearly     screening mammography is suggested.  (07/24/2009)   Mammogram action/deferral: Ordered  (04/10/2009)   Mammogram due: 07/16/2009    DXA bone density scan: Not documented   DXA bone density action/deferral: Deferred  (04/18/2009)   Smoking status: quit  (08/16/2009)  Diabetes Mellitus   HgbA1C: 7.8  (08/16/2009)    Eye exam: No diabetic retinopathy.   Visual acuity OD (best corrected):     20/25 Visual acuity OS (best corrected):     20/20 Intraocular pressure OD:     16 Intraocular pressure OS:     14   (  08/15/2009)    Foot exam: yes  (06/11/2009)   Foot exam action/deferral: Do  today   High risk foot: Not documented   Foot care education: Not documented    Urine microalbumin/creatinine ratio: 58.7  (04/18/2009)   Urine microalbumin action/deferral: Ordered    Diabetes flowsheet reviewed?: Yes  Lipids   Total Cholesterol: 205  (04/10/2009)   Lipid panel action/deferral: Deferred   LDL: 133  (04/10/2009)   LDL Direct: Not documented   HDL: 49  (04/10/2009)   Triglycerides: 114  (04/10/2009)    SGOT (AST): 15  (06/11/2009)   SGPT (ALT): 13  (06/11/2009) CMP ordered    Alkaline phosphatase: 62  (06/11/2009)   Total bilirubin: 0.3  (06/11/2009)    Lipid flowsheet reviewed?: Yes   Progress toward LDL goal: Unchanged  Hypertension   Last Blood Pressure: 138 / 94  (08/16/2009)   Serum creatinine: 1.17  (06/11/2009)   Serum potassium 3.5  (06/11/2009) CMP ordered     Hypertension flowsheet reviewed?: Yes   Progress toward BP goal: At goal   Hypertension comments: manual recheck in room 120/85  Self-Management Support :   Personal Goals (by the next clinic visit) :     Personal A1C goal: 7  (04/18/2009)     Personal blood pressure goal: 130/80  (04/18/2009)     Personal LDL goal: 100  (04/18/2009)    Patient will work on the following items until the next clinic visit to reach self-care goals:     Medications and monitoring: take my medicines every day, check my blood sugar, examine my feet every day  (08/16/2009)     Eating: drink diet soda or water instead of juice or soda, use fresh or frozen vegetables, eat foods that are low in salt, eat baked foods instead of fried foods, eat fruit for snacks and desserts  (08/16/2009)     Activity: take a 30 minute walk every day  (08/16/2009)    Diabetes self-management support: Copy of home glucose meter record, Written self-care plan  (08/16/2009)   Diabetes care plan printed   Last diabetes self-management training by diabetes educator: 06/11/2009    Hypertension self-management support: Written self-care  plan  (08/16/2009)   Hypertension self-care plan printed.    Lipid self-management support: Written self-care plan  (08/16/2009)   Lipid self-care plan printed.  Process Orders Check Orders Results:     [facility]: Order checked:      -- T-Capillary Blood Glucose -- No CPT codes found (CPT: )      -- T-Capillary Blood Glucose --  NO TEST CODE (CPT: ) Tests Sent for requisitioning (August 19, 2009 9:45 AM):     08/16/2009: [facility] -- T-Capillary Blood Glucose [[id number]] (signed)     08/16/2009: [facility] -- T-Comprehensive Metabolic Panel [id number] (signed)   Laboratory Results   Blood Tests   Date/Time Received: August 16, 2009 11:41 AM Date/Time Reported: Lenoria Farrier  August 16, 2009 11:41 AM  HGBA1C: 7.8%   (Normal Range: Non-Diabetic - 3-6%   Control Diabetic - 6-8%) CBG Random:: 115mg /dL  Comments: CBG repeated during lab vistit.  [place] [facility] [place]  August 16, 2009 11:41 AM

## 2010-08-21 NOTE — Assessment & Plan Note (Signed)
Summary: EST-CK/FU/MEDS/CFB   Vital Signs:  Patient profile:   65 year old female Height:      64 inches (162.56 cm) Weight:      191.7 pounds (87.14 kg) BMI:     33.02 Temp:     97.4 degrees F (36.33 degrees C) oral Pulse rate:   64 / minute BP sitting:   120 / 77  (right arm) Cuff size:   regular  Vitals Entered By: Lucky Rathke NT II (August 26, 2009 2:50 PM) CC: FOLLOW UP APPT  / SEEN D. RILEY    Is Patient Diabetic? No Pain Assessment Patient in pain? no      Nutritional Status BMI of > 30 = obese  Have you ever been in a relationship where you felt threatened, hurt or afraid?No   Does patient need assistance? Functional Status Self care Ambulation Normal Comments FOLLOW UP APPT /    Primary Care Provider:  Harriett Sine MD  CC:  FOLLOW UP APPT  / SEEN D. RILEY   .  History of Present Illness: 65 yo woman with PMH as listed below who presents for fu of her DM and hypokalemia. I last saw her 08-16-2009.     DM: This patient had a new diagnosis of DMII in 03-2009 and elected to try lifestyle modification. At 05-2009 visit her A1c had decreased a small amount from 8 to 7.8, and we agreed that she could try lifestyle modification for a few more months to see if she could get at goal without adding medications. The patient returned to clinic 08-16-2009. She was still walking, although less because of the weather. She is eating healthier and even keeps a food diary. She met with Barnabas Harries last visit. At last visit she was still motivated to try lifestyle changes rather than taking any medications. However, her A1c remained a 7.8, which is the same as it was at her 05-2009 visit. She was reluctant to start metformin after that visit, so I gave her some time to think about it and brought her back today to discuss starting metformin.         Hypokalemia: The patient had K+ 3.4 last visit. Planned to recheck today,  Preventive Health: Mammogram up to date. Uptodate  on flu, pneumovax. Colonoscopies managed by Dr. Collene Mares and is uptodate per patient.   Depression History:      The patient denies a depressed mood most of the day and a diminished interest in her usual daily activities.          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 * Pineapple 2)  ! * White Gold  Review of Systems  The patient denies anorexia, fever, weight loss, weight  gain, vision loss, decreased hearing, hoarseness, chest pain, syncope, dyspnea on exertion, peripheral edema, prolonged cough, headaches, hemoptysis, abdominal pain, melena, hematochezia, severe indigestion/heartburn, hematuria, incontinence, genital sores, muscle weakness, suspicious skin lesions, transient blindness, difficulty walking, depression, unusual weight change, and abnormal bleeding.    Physical Exam  General:  alert, well-developed, well-nourished, and well-hydrated.   Head:  normocephalic and atraumatic.   Eyes:  vision grossly intact, pupils equal, pupils round, and pupils reactive to light.   Ears:  no external deformities.   Nose:  no external deformity.   Neck:  no cervical lymphadenopathy.   Lungs:  normal respiratory effort, no accessory muscle use, normal breath sounds, no crackles, and no wheezes.   Heart:   normal rate, regular rhythm, no murmur, no gallop, and no rub.   Abdomen:  soft, non-tender, and normal bowel sounds.   Extremities:    Neurologic:  alert & oriented X3, cranial nerves II-XII intact, strength normal in all extremities, and sensation intact to light touch.   Psych:  Oriented X3, memory intact for recent and remote, normally interactive, good eye contact, not anxious appearing, and not depressed appearing.     Impression & Recommendations:  Problem # 1:  DM (ICD-250.00) Patient agrees to start metformin today. I have counseled about the possibilities for GI side effects. I have also asked that she keep up the good lifestyle changes with walking, good diet, etc., and have emphasized that the lifestyle changes are just as important even though we are adding a medication.  Her updated medication list for this problem includes:    Lisinopril 40 Mg Tabs (Lisinopril) .Marland Kitchen... Take 1 tablet by mouth once a day    Metformin Hcl 500 Mg Tabs (Metformin hcl) .Marland Kitchen... Take 1 tablet by mouth twice a day  Problem # 2:  HYPOKALEMIA (ICD-276.8) Checking Bmet.  Orders: T-Basic Metabolic Panel ([id number])  Problem # 3:  Preventive Health Care (ICD-V70.0) Preventive Health: Mammogram up to date. Uptodate on flu, pneumovax. Colonoscopies managed by Dr. Collene Mares and is uptodate per patient.  Complete Medication List: 1)  Hydrochlorothiazide 25 Mg Tabs (Hydrochlorothiazide) .... Take 1 tablet by mouth once a day 2)  Lisinopril 40 Mg Tabs (Lisinopril) .... Take 1 tablet by mouth once a day 3)  Norvasc 10 Mg Tabs (Amlodipine besylate) .... Take 1 tablet by mouth once a day 4)  Align Caps (Misc intestinal flora regulat) .... Take 1 tablet by mouth once a day 5)  Debrox 6.5 % Soln (Carbamide peroxide) .... Use in ears as needed for earwax buildup. 6)  Fexofenadine Hcl 180 Mg Tabs (Fexofenadine hcl) .... Take 1 tablet by mouth once a day 7)  Simvastatin 20 Mg Tabs (Simvastatin) .... Please take one  tablet by mouth each evening. 8)  Metformin Hcl 500 Mg Tabs (Metformin hcl) .... Take 1 tablet by mouth twice a day  Patient Instructions: 1)  Please make a followup appointment in 3 months or sooner as needed to resume your healthcare.  Prescriptions: METFORMIN HCL 500 MG TABS (METFORMIN HCL) Take 1 tablet by mouth twice a day  #62 x 2   Entered and Authorized by:   Harriett Sine MD   Signed by:   Harriett Sine MD on 08/26/2009   Method used:   Print then Give to Patient   RxID:   [id number]  Process Orders Check Orders Results:     [facility]: Check successful Tests Sent for requisitioning (August 27, 2009 10:31 AM):  08/26/2009: [facility] -- T-Basic Metabolic Panel 0000000 (signed)    Prevention & Chronic Care Immunizations   Influenza vaccine: Fluvax Non-MCR  (04/10/2009)    Tetanus booster: Not documented   Td booster deferral: Deferred  (08/16/2009)    Pneumococcal vaccine: Pneumovax  (06/07/2008)    H. zoster vaccine: Not documented   H. zoster vaccine deferral: Deferred  (04/18/2009)  Colorectal Screening   Hemoccult: Not documented   Hemoccult action/deferral: Deferred  (04/18/2009)    Colonoscopy: Not documented   Colonoscopy action/deferral: Deferred  (08/16/2009)  Other Screening   Pap smear: Not documented   Pap smear action/deferral: Deferred  (08/16/2009)    Mammogram: Repeat of Right breast: The mammographic nodule corresponds to a simple cyst.  No     mammographic or sonographic evidence of malignancy.  Yearly     screening mammography is suggested.  (07/24/2009)   Mammogram action/deferral: Ordered  (04/10/2009)   Mammogram due: 07/16/2009    DXA bone density scan: Not documented   DXA bone density action/deferral: Deferred  (04/18/2009)   Smoking status: quit  (08/26/2009)  Diabetes Mellitus   HgbA1C: 7.8  (08/16/2009)    Eye exam: No diabetic retinopathy.   Visual acuity OD (best  corrected):     20/25 Visual acuity OS (best corrected):     20/20 Intraocular pressure OD:     16 Intraocular pressure OS:     14   (08/15/2009)    Foot exam: yes  (06/11/2009)   Foot exam action/deferral: Do today   High risk foot: Not documented   Foot care education: Not documented    Urine microalbumin/creatinine ratio: 58.7  (04/18/2009)   Urine microalbumin action/deferral: Ordered  Lipids   Total Cholesterol: 205  (04/10/2009)   Lipid panel action/deferral: Deferred   LDL: 133  (04/10/2009)   LDL Direct: Not documented   HDL: 49  (04/10/2009)   Triglycerides: 114  (04/10/2009)    SGOT (AST): 14  (08/16/2009)   SGPT (ALT): 13  (08/16/2009)   Alkaline phosphatase: 76  (08/16/2009)   Total bilirubin: 0.4  (08/16/2009)  Hypertension   Last Blood Pressure: 120 / 77  (08/26/2009)   Serum creatinine: 1.14  (08/16/2009)   Serum potassium 3.4  (08/16/2009)  Self-Management Support :   Personal Goals (by the next clinic visit) :     Personal A1C goal: 7  (04/18/2009)     Personal blood pressure goal: 130/80  (04/18/2009)     Personal LDL goal: 100  (04/18/2009)    Patient will work on the following items until the next clinic visit to reach self-care goals:     Medications and monitoring: take my medicines every day, check my blood sugar, bring all of my medications to every visit, examine my feet every day  (08/26/2009)     Eating: drink diet soda or water instead of juice or soda, eat more vegetables, use fresh or frozen vegetables, eat foods that are low in salt, eat baked foods instead of fried foods, eat fruit for snacks and desserts, limit or avoid alcohol  (08/26/2009)     Activity: take a 30 minute walk every day  (08/26/2009)    Diabetes self-management support: Copy of home glucose meter record, Written self-care plan  (08/16/2009)   Last diabetes self-management training by diabetes educator: 08/16/2009    Hypertension self-management support: Written self-care  plan  (08/16/2009)    Lipid self-management support: Written self-care plan  (08/16/2009)

## 2010-08-21 NOTE — Assessment & Plan Note (Signed)
Summary: referral/dmr  needs referral for Diabetes Self managment and Medical Nutrtion Therpay  Diabetes Self Management Training Referral Patient Name: Gina Bailey Date Of Birth: [birth year] MRN: [id number] Current Diagnosis:  NEED PROPH VAC W/COMB DIPHTH-TETANUS-PERTUSS VAC (ICD-V06.1) NONSPECIFIC ABNORM RESULTS KIDNEY FUNCTION STUDY (ICD-794.4) DM (ICD-250.00) VENTRAL HERNIA, INCISIONAL (ICD-553.21) HYPERLIPIDEMIA (ICD-272.2) VILLOUS ADENOMA, COLON, HX OF (ICD-V12.72) Hosp for COLECTOMY, PARTIAL, WITH ANASTOMOSIS, HX OF (ICD-V15.2) ALLERGIC RHINITIS, SEASONAL (ICD-477.0) VENTRICULAR HYPERTROPHY, LEFT (ICD-429.3) HYPERTENSION (ICD-401.9)     Management Training Needs:   Follow-up DSMT(2 hours/year)  Annual Follow-up MNT(2 hours/year)  Complicating Conditions:

## 2010-08-21 NOTE — Letter (Signed)
[facility]   Imported By: Garlan Fillers 11/12/2009 12:23:53  _____________________________________________________________________  External Attachment:    Type:   Image     Comment:   External Document

## 2010-08-21 NOTE — Assessment & Plan Note (Signed)
Summary: FU VISIT/DS   Vital Signs:  Patient profile:   65 year old female Height:      64 inches Weight:      187.0 pounds BMI:     32.21 Temp:     98.2 degrees F oral Pulse rate:   89 / minute BP sitting:   128 / 86  (right arm)  Vitals Entered By: Silverio Decamp NT II (June 03, 2010 2:18 PM) CC: FOLLOWUP VISIT  Is Patient Diabetic? Yes Did you bring your meter with you today? Yes Pain Assessment Patient in pain? no      Nutritional Status BMI of > 30 = obese CBG Result 96  Have you ever been in a relationship where you felt threatened, hurt or afraid?No   Does patient need assistance? Functional Status Self care Ambulation Normal   Primary Care Provider:  Sheral Apley MD  CC:  FOLLOWUP VISIT .  History of Present Illness: Pt is a very pleasant 65 y/o woman with pmh outlined below here for a followup visit. No complaints today, no acute issues and has been doing great with lifestyle modification. She exercises 5 times per week by walking 30-37min at a time. She's also been strict with her diet. Her weight is also stable at around 188ibs.   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 Take 1 Tablet By Mouth Once  A Day 2)  Lisinopril 40 Mg Tabs (Lisinopril) .... Take 1 Tablet By Mouth Once A Day 3)  Norvasc 10 Mg  Tabs (Amlodipine Besylate) .... Take 1 Tablet By Mouth Once A Day 4)  Align  Caps (Misc Intestinal Flora Regulat) .... Take 1 Tablet By Mouth Once A Day 5)  Debrox 6.5 % Soln (Carbamide Peroxide) .... Use in Ears As Needed For Earwax Buildup. 6)  Fexofenadine Hcl 180 Mg Tabs (Fexofenadine Hcl) .... Take 1 Tablet By Mouth Once A Day 7)  Simvastatin 20 Mg Tabs (Simvastatin) .... Please Take One Tablet By Mouth Each Evening. 8)  Bayer Contour Test  Strp (Glucose Blood) .... Use To Check Blood Sugar As Needed To Learn How Foods and Activity Affect Blood Sugar. 9)  Lancets 30g  Misc (Lancets) .... Use To Check Blood Sugar As Needed. 10)  Aspirin 81 Mg Tbec (Aspirin) .... Take 1 Tablet By Mouth Once A Day  Current Medications (verified): 1)  Hydrochlorothiazide 25 Mg Tabs (Hydrochlorothiazide) .... Take 1 Tablet By Mouth Once A Day 2)  Lisinopril 40 Mg Tabs (Lisinopril) .... Take 1 Tablet By Mouth Once A Day 3)  Norvasc 10 Mg  Tabs (Amlodipine Besylate) .... Take 1 Tablet By Mouth Once A Day 4)  Align  Caps (Misc Intestinal Flora Regulat) .Marland KitchenMarland KitchenMarland Kitchen  Take 1 Tablet By Mouth Once A Day 5)  Debrox 6.5 % Soln (Carbamide Peroxide) .... Use in Ears As Needed For Earwax Buildup. 6)  Fexofenadine Hcl 180 Mg Tabs (Fexofenadine Hcl) .... Take 1 Tablet By Mouth Once A Day 7)  Simvastatin 20 Mg Tabs (Simvastatin) .... Please Take One Tablet By Mouth Each Evening. 8)  Bayer Contour Test  Strp (Glucose Blood) .... Use To Check Blood Sugar As Needed Up To 1 Time A Day. 9)  Lancets 30g  Misc (Lancets) .... Use To Check Blood Sugar As Needed. 10)  Aspirin 81 Mg Tbec (Aspirin) .... Take 1 Tablet By Mouth Once A Day  Allergies (verified): 1)  ! * Pineapple 2)  ! * White Gold  Review of Systems  The patient denies fever, weight gain, vision loss, chest pain, dyspnea on exertion, hematochezia, and depression.     Physical Exam  General:  Well-developed,well-nourished,in no acute distress; alert,appropriate and cooperative throughout examination Head:  normocephalic and atraumatic.  normocephalic and atraumatic.   Eyes:  vision grossly intact, pupils equal, pupils round, and pupils reactive to light.  vision grossly intact, pupils equal, pupils round, and pupils reactive to light.   Neck:  supple and full ROM.  supple and full ROM.   Lungs:  Normal respiratory effort, chest expands symmetrically. Lungs are clear to auscultation, no crackles or wheezes. Heart:  Normal rate and regular rhythm. S1 and S2 normal without gallop, murmur, click, rub or other extra sounds. Abdomen:  Bowel sounds positive,abdomen soft and non-tender without masses, organomegaly or hernias noted.  Diabetes Management Exam:    Foot Exam (with socks and/or shoes not present):       Sensory-Pinprick/Light touch:          Left medial foot (L-4): normal          Left dorsal foot (L-5): normal          Left lateral foot (S-1): normal          Right medial foot (L-4): normal          Right dorsal foot (L-5): normal          Right lateral foot (S-1): normal       Sensory-Monofilament:          Left foot: normal          Right foot: normal       Inspection:          Left foot: normal          Right foot: normal       Nails:          Left foot: normal          Right foot: normal    Eye Exam:       Eye Exam not due   Impression & Recommendations:  Problem # 1:  HYPERTENSION (ICD-401.9)  Stable today, on current regimen as listed below. CMP wnl per below. Encouraged to continue with her life style modifications as well. No med changes today.  Her updated medication list for this problem includes:    Hydrochlorothiazide 25 Mg Tabs (Hydrochlorothiazide) .Marland Kitchen... Take 1 tablet by mouth once a day    Lisinopril 40 Mg Tabs (Lisinopril) .Marland Kitchen... Take 1 tablet by mouth once a day    Norvasc 10 Mg Tabs (Amlodipine besylate) .Marland Kitchen...  Take 1 tablet by mouth once a day  BP today: 128/86 Prior BP: 130/90 (01/21/2010)  Prior 10 Yr Risk Heart  Disease: 9 % (09/08/2006)  Labs Reviewed: K+: 3.7 (01/23/2010) Creat: : 1.11 (01/23/2010)   Chol: 134 (01/23/2010)   HDL: 58 (01/23/2010)   LDL: 65 (01/23/2010)   TG: 54 (01/23/2010)  Her updated medication list for this problem includes:    Hydrochlorothiazide 25 Mg Tabs (Hydrochlorothiazide) .Marland Kitchen... Take 1 tablet by mouth once a day    Lisinopril 40 Mg Tabs (Lisinopril) .Marland Kitchen... Take 1 tablet by mouth once a day    Norvasc 10 Mg Tabs (Amlodipine besylate) .Marland Kitchen... Take 1 tablet by mouth once a day  Problem # 2:  DM (ICD-250.00)  Well controlled without any antiglycemics. Again, encouraged to continue life style modifications. Random blood glucose is 96 today and her AIC is 6.9. Although her last microalbumin was elevated at last check, she's already on an ACEI. Foot exam and eye exam are also wnl.   Her updated medication list for this problem includes:    Lisinopril 40 Mg Tabs (Lisinopril) .Marland Kitchen... Take 1 tablet by mouth once a day    Aspirin 81 Mg Tbec (Aspirin) .Marland Kitchen... Take 1 tablet by mouth once a day  Labs Reviewed: Creat: 1.11 (01/23/2010)     Last Eye Exam: No diabetic retinopathy.   Visual acuity OD (best corrected):     20/25 Visual acuity OS (best corrected):     20/20 Intraocular pressure OD:     16 Intraocular pressure OS:     14  (08/15/2009) Reviewed HgBA1c results: 6.9 (06/03/2010)  6.6 (11/15/2009)  Her updated medication list for this problem includes:    Lisinopril 40 Mg Tabs (Lisinopril) .Marland Kitchen... Take 1 tablet by mouth once a day    Aspirin 81 Mg Tbec (Aspirin) .Marland Kitchen... Take 1 tablet by mouth once a day  Orders: T- Capillary Blood Glucose [id number]) T-Hgb A1C (in-house) [id number])  Problem # 3:  Preventive Health Care (ICD-V70.0) Tetanus today.  Had a partial right colectomy in Feb 2008 for a 4cm villious adenoma. Now stable. Will obtain notes from Dr. Lorie Apley office,  re: colonoscopy. Next mammogram due Jan 2012.  Problem # 4:  HYPERLIPIDEMIA ([id number])  Stable on current regimen. No changes today.   Her updated medication list for this problem includes:    Simvastatin 20 Mg Tabs (Simvastatin) .Marland Kitchen... Please take one tablet by mouth each evening.  Labs Reviewed: SGOT: 15 (01/23/2010)   SGPT: 15 (01/23/2010)  Prior 10 Yr Risk Heart Disease: 9 % (09/08/2006)   HDL:58 (01/23/2010), 49 (04/10/2009)  LDL:65 (01/23/2010), 133 (04/10/2009)  Chol:134 (01/23/2010), 205 (04/10/2009)  Trig:54 (01/23/2010), 114 (04/10/2009)  Her updated medication list for this problem includes:    Simvastatin 20 Mg Tabs (Simvastatin) .Marland Kitchen... Please take one tablet by mouth each evening.  Complete Medication List: 1)  Hydrochlorothiazide 25 Mg Tabs (Hydrochlorothiazide) .... Take 1 tablet by mouth once a day 2)  Lisinopril 40 Mg Tabs (Lisinopril) .... Take 1 tablet by mouth once a day 3)  Norvasc 10 Mg Tabs (Amlodipine besylate) .... Take 1 tablet by mouth once a day 4)  Align Caps (Misc intestinal flora regulat) .... Take 1 tablet by mouth once a day 5)  Debrox 6.5 % Soln (Carbamide peroxide) .... Use in ears as needed for earwax buildup. 6)  Fexofenadine Hcl 180 Mg Tabs (Fexofenadine hcl) .... Take 1 tablet by mouth once a day 7)  Simvastatin 20 Mg Tabs (Simvastatin) .... Please take one tablet by mouth each evening. 8)  Estate manager/land agent Strp (Glucose blood) .... Use to check blood sugar as needed up  to 1 time a day. 9)  Lancets 30g Misc (Lancets) .... Use to check blood sugar as needed. 10)  Aspirin 81 Mg Tbec (Aspirin) .... Take 1 tablet by mouth once a day  Patient Instructions: 1)  Keep up with your diet and exercise regimen. You are doing a great job. 2)  Pls call the clinic if you have any questions or concerns. 3)  Please schedule a follow-up appointment in 3 months.   Orders Added: 1)  T- Capillary Blood Glucose [82948] 2)  T-Hgb A1C (in-house) [[id number]] 3)   Est. Patient Level III [id number]    Laboratory Results   Blood Tests   Date/Time Received: June 03, 2010 1:43 PM  Date/Time Reported: Lenoria Farrier  June 03, 2010 1:43 PM   HGBA1C: 6.9%   (Normal Range: Non-Diabetic - 3-6%   Control Diabetic - 6-8%) CBG Random:: 96mg /dL     Prevention & Chronic Care Immunizations   Influenza vaccine: Fluvax MCR  (05/01/2010)   Influenza vaccine due: 03/21/2011    Tetanus booster: Not documented   Td booster deferral: Deferred  (08/16/2009)    Pneumococcal vaccine: Pneumovax  (06/07/2008)    H. zoster vaccine: Not documented   H. zoster vaccine deferral: Deferred  (04/18/2009)  Colorectal Screening   Hemoccult: Not documented   Hemoccult action/deferral: Deferred  (04/18/2009)    Colonoscopy: Not documented   Colonoscopy action/deferral: Deferred  (08/16/2009)  Other Screening   Pap smear: Not documented   Pap smear action/deferral: Deferred  (08/16/2009)    Mammogram: Repeat of Right breast: The mammographic nodule corresponds to a simple cyst.  No     mammographic or sonographic evidence of malignancy.  Yearly     screening mammography is suggested.  (07/24/2009)   Mammogram action/deferral: Ordered  (04/10/2009)   Mammogram due: 07/24/2010    DXA bone density scan: Not documented   DXA bone density action/deferral: Deferred  (04/18/2009)  Reports requested:   Last colonoscopy report requested.  Smoking status: quit  (06/03/2010)  Diabetes Mellitus   HgbA1C: 6.9  (06/03/2010)    Eye exam: No diabetic retinopathy.   Visual acuity OD (best corrected):     20/25 Visual acuity OS (best corrected):     20/20 Intraocular pressure OD:     16 Intraocular pressure OS:     14   (08/15/2009)   Eye exam due: 08/15/2010    Foot exam: yes  (06/03/2010)   Foot exam action/deferral: Do today   High risk foot: Yes  (06/03/2010)   Foot care education: Done  (01/21/2010)    Urine microalbumin/creatinine ratio: 58.7   (04/18/2009)   Urine microalbumin action/deferral: Ordered    Diabetes flowsheet reviewed?: Yes   Progress toward A1C goal: Unchanged  Lipids   Total Cholesterol: 134  (01/23/2010)   Lipid panel action/deferral: Deferred   LDL: 65  (01/23/2010)   LDL Direct: Not documented   HDL: 58  (01/23/2010)   Triglycerides: 54  (01/23/2010)    SGOT (AST): 15  (01/23/2010)   SGPT (ALT): 15  (01/23/2010)   Alkaline phosphatase: 59  (01/23/2010)   Total bilirubin: 0.5  (01/23/2010)    Lipid flowsheet reviewed?: Yes   Progress toward LDL goal: At goal  Hypertension   Last Blood Pressure: 128 / 86  (06/03/2010)   Serum creatinine: 1.11  (01/23/2010)   Serum potassium 3.7  (01/23/2010)    Hypertension flowsheet reviewed?: Yes   Progress toward BP goal: At goal  Self-Management Support :  Personal Goals (by the next clinic visit) :     Personal A1C goal: 7  (04/18/2009)     Personal blood pressure goal: 130/80  (04/18/2009)     Personal LDL goal: 100  (04/18/2009)    Patient will work on the following items until the next clinic visit to reach self-care goals:     Medications and monitoring: take my medicines every day, check my blood sugar, examine my feet every day  (06/03/2010)     Eating: drink diet soda or water instead of juice or soda, eat more vegetables, use fresh or frozen vegetables, eat foods that are low in salt, eat baked foods instead of fried foods, eat fruit for snacks and desserts  (06/03/2010)     Activity: take a 30 minute walk every day  (11/15/2009)     Other: continue with your walking program  (01/21/2010)    Diabetes self-management support: Education handout, Resources for patients handout  (06/03/2010)   Diabetes education handout printed   Last diabetes self-management training by diabetes educator: 12/24/2009    Hypertension self-management support: Education handout, Resources for patients handout  (06/03/2010)   Hypertension education handout printed     Lipid self-management support: Education handout, Resources for patients handout  (06/03/2010)     Lipid education handout printed      Resource handout printed.   Nursing Instructions: Diabetic foot exam today Give tetanus booster today Request report of last colonoscopy    Diabetic Foot Exam Comments: TOENAILS THICK, ON BOTH FEET LOOKS LIKE  A CYST ON TOE High Risk Feet? Yes   10-g (5.07) Semmes-Weinstein Monofilament Test Performed by: Silverio Decamp NT II          Right Foot          Left Foot Visual Inspection     normal           normal Site 1         normal         normal Site 2         normal         normal Site 3         normal         normal Site 4         normal         normal Site 5         normal         normal Site 6         normal         normal Site 7         normal         normal Site 8         normal         normal Site 9         normal         normal Site 10         normal         normal  Impression      normal         normal   Appended Document: FU VISIT/DS   Immunizations Administered:  Tetanus Vaccine:    Vaccine Type: Tdap    Site: right deltoid    Mfr: Gl[employer]oSmithK[employer]    Dose: 0.5 ml    Route: IM    Given by: Sander Nephew RN    Exp. Date: 05/16/2012  Lot #: [id number]    VIS given: 06/06/08 version given June 03, 2010.

## 2010-09-04 ENCOUNTER — Ambulatory Visit (INDEPENDENT_AMBULATORY_CARE_PROVIDER_SITE_OTHER): Payer: Medicare Other | Admitting: Dietician

## 2010-09-04 ENCOUNTER — Encounter: Payer: Self-pay | Admitting: Internal Medicine

## 2010-09-04 ENCOUNTER — Ambulatory Visit (INDEPENDENT_AMBULATORY_CARE_PROVIDER_SITE_OTHER): Payer: Medicare Other | Admitting: Internal Medicine

## 2010-09-04 VITALS — BP 158/89 | HR 82 | Temp 97.3°F | Ht 64.0 in | Wt 191.8 lb

## 2010-09-04 DIAGNOSIS — E119 Type 2 diabetes mellitus without complications: Secondary | ICD-10-CM

## 2010-09-04 DIAGNOSIS — E782 Mixed hyperlipidemia: Secondary | ICD-10-CM

## 2010-09-04 DIAGNOSIS — Z0000 Encounter for general adult medical examination without abnormal findings: Secondary | ICD-10-CM

## 2010-09-04 DIAGNOSIS — I10 Essential (primary) hypertension: Secondary | ICD-10-CM

## 2010-09-04 LAB — POCT GLYCOSYLATED HEMOGLOBIN (HGB A1C): Hemoglobin A1C: 7

## 2010-09-04 LAB — GLUCOSE, CAPILLARY: Glucose-Capillary: 156 mg/dL — ABNORMAL HIGH (ref 70–99)

## 2010-09-04 NOTE — Patient Instructions (Addendum)
Great job on staying healthy by regularly exercising and watching your diet.  Take your blood pressure medicines as prescribed. Please let us know if you have any questions or concerns.

## 2010-09-04 NOTE — Progress Notes (Signed)
  Subjective:    Patient ID: Gina Bailey, female    DOB: 1946/02/20, 65 y.o.   MRN: [id number]  HPI  Patient is a very pleasant  65 year old  woman with past medical history of hypertension, hyperlipidemia and type 2 diabetes diet-controlled who is here on regular follow up.  Since her last visit in November she has no complaints today and she's been doing great at home with lifestyle modification. She continues to exercise 5 times per week by walking 30-45 minutes at a time.   Since her last visit in June 03, 2010 appears she's gained 2 ibs.  She is also been watching her diet and her diet includes low salt low sodium diet.   She's been checking her daily CBGs and have ranged in the mid 100s. She denies any interval history of hospital admissions fever or chills chest pain trouble breathing abdominal pain abnormal weight changes dysuria hematuria or any other systemic symptoms.  She is up-to-date on her eye exam,  on colonoscopy, on exam and her mammogram.  Will need to request theses study reports.   Eye last January [facility] [id number] Colonoscopy -09 dr. Collene Mares Foot exam - up to date mammo - December 2011 Pap - today   Review of Systems  per history of present illness above.    Objective:   Physical Exam  Constitutional: She is oriented to person, place, and time. She appears well-developed and well-nourished. No distress.  Cardiovascular: Normal rate, regular rhythm and normal heart sounds.  Exam reveals no gallop and no friction rub.   No murmur heard. Pulmonary/Chest: Effort normal and breath sounds normal. No respiratory distress. She has no wheezes. She has no rales.  Abdominal: Soft. Bowel sounds are normal. There is no tenderness.  Genitourinary: Vagina normal. No vaginal discharge found.  Musculoskeletal: Normal range of motion.  Neurological: She is alert and oriented to person, place, and time.  Psychiatric: She has a normal mood and affect.           Assessment & Plan:

## 2010-09-04 NOTE — Assessment & Plan Note (Addendum)
Relatively well controlled on lifestyle changes only. Daily CBGs have been ranging in the mid 100s. Hemoglobin A1c stable at 7. Urine microalbumin checked over a year ago was abnormal however patient is currently on an ACE inhibitor.  Reports  having done her diabetic eye exam earlier in January of this year, so will have to require request the report from [facility].  Foot exam up to date and was within normal limits.  For today encouraged continued lifestyle modification and has appointment to followup with Barnabas Harries for diabetes education today. If AIC is persistently elevated and not at goal, or if she is failing life style modification, then may have to consider tighter control to prevent diabetic related complications of retinopathy, neuropathy,or nephropathy.

## 2010-09-04 NOTE — Assessment & Plan Note (Signed)
Cholesterol levels very well controlled on lifestyle modifications. The patient has a prescription for Zocor on her medication list although she states that she has not been taking her statin. Again she was encouraged to continue her lifestyle modification changes and her lipid panel will be rechecked at her next appointment to determine whether or not she needs to be restarted on her statin.

## 2010-09-04 NOTE — Assessment & Plan Note (Signed)
Systolic blood pressure a bit elevated today in the 150s. Patient notes that she did not take her medication earlier this morning and had a bit of trouble in the parking lot and so thinks she's a little bit anxious currently. She is on a regimen of Norvasc, hydrochlorothiazide and lisinopril.  Her blood pressures have been very well controlled on this regimen and so will not make any changes to her meds today. Again she was encouraged to take all her medicines exactly as prescribed  especially given her comorbidities.

## 2010-09-04 NOTE — Progress Notes (Signed)
Medical Nutrition Therapy:  Appt start time: 1000 end time:  1030.  Assessment:  Primary concerns today: Weight management and Blood sugar control  Usual eating pattern includes Meal 3 and 0+ snacks per day.      Avoided foods include: cookies and candy, soda, sweetened cereal and ice cream and desserts.     Usual physical activity includes walking 30 minutes a day 2 days per week  Diagnosis and Intervention:  Progress Towards Goal(s):  Some progress   Nutritional Diagnosis:  NB-2.1 Physical inactivity As related to needing more activity to acheive stated goals.  As evidenced by increased weight and blood sugars.  Interventions: 1- Discussed current A1C and weight in relation to previous values 2- Used Goal setting to assist patient to increase her physical activty back to the level where it was when she was acheiving her stated goals. 3-Education re using a pedometer to measure activity on non exercise days vs exercise days.  4- will follow up by phone in 2 months    Monitoring/Evaluation:  Dietary intake/ blood sugars/ exercise history  in 6 month(s)

## 2010-09-04 NOTE — Assessment & Plan Note (Signed)
Pap smear today. Colonoscopy, mammogram, and eye exam up-to-date per patient and will request the reports from respective physicians' offices.

## 2010-09-26 ENCOUNTER — Telehealth: Payer: Self-pay | Admitting: *Deleted

## 2010-09-26 ENCOUNTER — Emergency Department (HOSPITAL_COMMUNITY): Payer: Medicare Other

## 2010-09-26 ENCOUNTER — Emergency Department (HOSPITAL_COMMUNITY)
Admission: EM | Admit: 2010-09-26 | Discharge: 2010-09-26 | Disposition: A | Discharge status: Home / Self Care | Payer: Medicare Other | Attending: Emergency Medicine | Admitting: Emergency Medicine

## 2010-09-26 DIAGNOSIS — E119 Type 2 diabetes mellitus without complications: Secondary | ICD-10-CM | POA: Insufficient documentation

## 2010-09-26 DIAGNOSIS — I10 Essential (primary) hypertension: Secondary | ICD-10-CM | POA: Insufficient documentation

## 2010-09-26 DIAGNOSIS — Z79899 Other long term (current) drug therapy: Secondary | ICD-10-CM | POA: Insufficient documentation

## 2010-09-26 DIAGNOSIS — M25519 Pain in unspecified shoulder: Secondary | ICD-10-CM | POA: Insufficient documentation

## 2010-09-26 DIAGNOSIS — Z7982 Long term (current) use of aspirin: Secondary | ICD-10-CM | POA: Insufficient documentation

## 2010-09-26 DIAGNOSIS — E785 Hyperlipidemia, unspecified: Secondary | ICD-10-CM | POA: Insufficient documentation

## 2010-09-26 LAB — POCT CARDIAC MARKERS
Myoglobin, poc: 99.5 ng/mL (ref 12–200)
Troponin i, poc: 0.07 ng/mL (ref 0.00–0.09)

## 2010-09-26 NOTE — Telephone Encounter (Signed)
Call from pt states she had pain under her right shoulder blade x 1 week.  Has now moved toward the middle of her right shoulder.  Problems moving her shoulder without pain.  Michela Pitcher it is about an 8 or a 67.  Has not taken anything other then Tylenol.  Ibuprofen is not working.  Very uncomfortable at present.  Has a lot of burping.  Feels better after burping.  Dull pain in her arm at present.  At first went to elbow and then up.  When moving she will burp and it eases off.  Thinks it may be cold.

## 2010-09-26 NOTE — Telephone Encounter (Signed)
Consulted with Dr. Hilma Favors.  Pt to go to the ER for evaluation of her shoulder pain.  Pt was informed and said that she will come over as soon as possible.

## 2010-09-26 NOTE — Telephone Encounter (Signed)
Patient has significant risk factors for CAD. Needs ED evaluation.

## 2010-09-29 ENCOUNTER — Telehealth: Payer: Self-pay | Admitting: *Deleted

## 2010-09-29 NOTE — Telephone Encounter (Signed)
Pt called stating she was seen in ED on Friday. She was seen for rt shoulder pain  Needs f/u Will see tomorrow

## 2010-09-30 ENCOUNTER — Ambulatory Visit (INDEPENDENT_AMBULATORY_CARE_PROVIDER_SITE_OTHER): Payer: Medicare Other | Admitting: Internal Medicine

## 2010-09-30 ENCOUNTER — Encounter: Payer: Self-pay | Admitting: Internal Medicine

## 2010-09-30 ENCOUNTER — Other Ambulatory Visit: Payer: Self-pay | Admitting: Internal Medicine

## 2010-09-30 VITALS — BP 137/88 | HR 88 | Temp 98.2°F | Ht 66.0 in | Wt 187.3 lb

## 2010-09-30 DIAGNOSIS — Z0000 Encounter for general adult medical examination without abnormal findings: Secondary | ICD-10-CM

## 2010-09-30 DIAGNOSIS — D374 Neoplasm of uncertain behavior of colon: Secondary | ICD-10-CM | POA: Insufficient documentation

## 2010-09-30 DIAGNOSIS — E782 Mixed hyperlipidemia: Secondary | ICD-10-CM

## 2010-09-30 DIAGNOSIS — E119 Type 2 diabetes mellitus without complications: Secondary | ICD-10-CM

## 2010-09-30 DIAGNOSIS — K439 Ventral hernia without obstruction or gangrene: Secondary | ICD-10-CM | POA: Insufficient documentation

## 2010-09-30 DIAGNOSIS — I10 Essential (primary) hypertension: Secondary | ICD-10-CM

## 2010-09-30 DIAGNOSIS — R1011 Right upper quadrant pain: Secondary | ICD-10-CM

## 2010-09-30 LAB — COMPREHENSIVE METABOLIC PANEL WITH GFR
ALT: 13 U/L (ref 0–35)
AST: 15 U/L (ref 0–37)
Albumin: 4.3 g/dL (ref 3.5–5.2)
Alkaline Phosphatase: 62 U/L (ref 39–117)
BUN: 13 mg/dL (ref 6–23)
CO2: 29 meq/L (ref 19–32)
Calcium: 9.4 mg/dL (ref 8.4–10.5)
Chloride: 102 meq/L (ref 96–112)
Creat: 1.18 mg/dL (ref 0.40–1.20)
Glucose, Bld: 122 mg/dL — ABNORMAL HIGH (ref 70–99)
Potassium: 3.3 meq/L — ABNORMAL LOW (ref 3.5–5.3)
Sodium: 144 meq/L (ref 135–145)
Total Bilirubin: 0.4 mg/dL (ref 0.3–1.2)
Total Protein: 7.5 g/dL (ref 6.0–8.3)

## 2010-09-30 LAB — GLUCOSE, CAPILLARY
Glucose-Capillary: 119 mg/dL — ABNORMAL HIGH (ref 70–99)
Glucose-Capillary: 96 mg/dL (ref 70–99)

## 2010-09-30 MED ORDER — AMLODIPINE BESYLATE 10 MG PO TABS: 10.0000 mg | ORAL_TABLET | Freq: Every day | ORAL | Status: DC

## 2010-09-30 MED ORDER — LISINOPRIL 40 MG PO TABS: 40.0000 mg | ORAL_TABLET | Freq: Every day | ORAL | Status: DC

## 2010-09-30 MED ORDER — SIMVASTATIN 20 MG PO TABS: 20.0000 mg | ORAL_TABLET | Freq: Every evening | ORAL | Status: DC

## 2010-09-30 MED ORDER — HYDROCHLOROTHIAZIDE 25 MG PO TABS: 25.0000 mg | ORAL_TABLET | Freq: Every day | ORAL | Status: DC

## 2010-09-30 NOTE — Assessment & Plan Note (Addendum)
Patient is currently off of her simvastatin therapy since her last office visit in November 2011. Patient misunderstood instructions given at that visit, specifically she thought that she was supposed to discontinue her simvastatin.  However during is no indication for the clinic note that the patient was instructed to do so. - Refill simvastatin. Prescription sent to [facility]. - Check comprehensive metabolic panel today.

## 2010-09-30 NOTE — Assessment & Plan Note (Addendum)
Cause of right shoulder pain is unclear, particularly in respect to alleviation of pain with burping. Pain was also difficult to localize. No known trauma to shoulder or increase of activity recently. Differential diagnosis includes musculoskeletal pain versus gallbladder disease versus gas pain. Patient remains without chest pain, palpitations, diaphoresis. - Will check Abd [place] to evaluate gallbladder, biliary system - Will check CMET - Will have close follow-up following [place].

## 2010-09-30 NOTE — Patient Instructions (Signed)
1) Please follow-up at the clinic in 3-4 weeks, at which time we will reevaluate your shoulder pain, diabetes, blood pressure. 2) Please bring all of your medications in a bag to your next visit. 3) Your medication refills have been sent to [facility].

## 2010-09-30 NOTE — Assessment & Plan Note (Signed)
Well controlled. Continue current regimen. - Will check microalb/creatinine ratio today - Will check CMET today. - UTD on eye exam, foot exam. - Continue with diet and exercise.

## 2010-09-30 NOTE — Assessment & Plan Note (Signed)
Well controlled. Continue current regimen. - Refills faxed to [facility] - Check CMET.

## 2010-09-30 NOTE — Assessment & Plan Note (Signed)
-   Will request colonoscopy report from Dr. Lorie Apley office, multiple references to this colonoscopy report, but no documentation found in centricity, echart, epic.

## 2010-09-30 NOTE — Progress Notes (Signed)
Subjective:    Patient ID: Gina Bailey, female    DOB: 1946-07-13, 65 y.o.   MRN: [id number]  HPI  patient is a 65 year old female with a past medical history of diabetes mellitus type 2, hyperlipidemia, hypertension , who presents to the clinic today for the following:  1) ER follow-up for right shoulder pain : Patient was seen at [facility] ER on 09/26/10 for evaluation of right shoulder pain, which was determined to be likely musculoskeletal in nature. CXR was negative for acute abnormalities. With history of DM, HTN, HLD, cardiac enzymes were cycled, first set showed mild increase in troponin to 0.11 with other CE negative, repeat 90 markers were entirely wnl. Therefore, pt was treated with Percocet, discharged home, and recommended close clinic follow-up.  Since hospital discharge, patient notes initial temporary improvement of pain. However, over last 3 days pain has recurred. In total pain has been ongoing x 1.5 weeks. Described as a sharp pain that started under her right shoulder blade, then moved superiorly.  Pain is intermittent since that time, occuring daily, unable to quantify # daily episodes. Episodes last approximately 15-20 minutes at a time. Pain continuous until patient is able to burp and or cough, then the pain subsides. Rated 8/10 in severity at worst, after burps will return to a 5/10, then it feels dull. No specific aggravating factors able to be identified.  Symptoms are not aggravated by specific foods including spicy foods, caffeine, or fatty foods.  Denies nausea, vomiting, diarrhea, fevers, chills, changes in stool character, blood in stools, changes in urinary character, chest pain, palpitations, tachycardia.  2) DM - Patient checking blood sugars 1 times daily, before breakfast. Reports blood sugars of 110 -140s mg/dL. No current medications, pt is diet controlled and exercise controlled. 0 hypoglycemic episodes since last visit. No chest pain, difficulty breathing, polydipsia,  polyuria, vision changes. No refills of testing supplies required today.  3) HTN - Patient does not check blood pressure regularly at home. Currently taking Amlodipine, Lisinopril, HCTZ without adverse reactions. Denies headaches, dizziness, lightheadedness, chest pain, shortness of breath. Requesting refills today.  4) HLD - Patient off of simvastatin since last clinic visit in 05/2011, because she thought she was instructed to stop taking this medication, however, no indication of this instruction in last clinic note. Supposed to be taking Simvastatin 20mg  daily.  Denies chest pain, difficulty breathing, palpitations, tachycardia, and muscle pains.    Review of Systems Per HPI.  Current Outpatient Prescriptions Medication Sig  . amLODipine (NORVASC) 10 MG tablet Take 1 tablet (10 mg total) by mouth daily.  Marland Kitchen aspirin 81 MG EC tablet Take 81 mg by mouth daily.    . bifidobacterium infantis (ALIGN) capsule Take 1 capsule by mouth daily.    . fexofenadine (ALLEGRA) 180 MG tablet Take 180 mg by mouth daily.    Marland Kitchen glucose blood (BAYER CONTOUR TEST) test strip Use to check blood sugar as needed up to 1 time a day   . hydrochlorothiazide 25 MG tablet Take 1 tablet (25 mg total) by mouth daily.  . Lancets 30G MISC Use to check blood sugar as needed   . lisinopril (PRINIVIL,ZESTRIL) 40 MG tablet Take 1 tablet (40 mg total) by mouth daily.   Allergies Pineapple  Past Medical History  Diagnosis Date  . Hypertension   . Ventricular hypertrophy 05/2006    LVH on ECG (05/2006) - Likely 2/2 HTN.  Marland Kitchen Hypokalemia     recurrent  . Seasonal allergies   .  Ventral hernia     Incisional ventral hernia. S/P repair by Dr. Hulen Skains  (08/2008)  . HLD (hyperlipidemia)   . Villous adenoma of colon     Hx of. S/P resection and partial colectomy with anastomosis by Dr. Hulen Skains. (08/2006)  . Diabetes mellitus     Diet-controlled. Not on antiglycemic medications.   Past Surgical History  Procedure Date  . Tubal  ligation   . Appendectomy   . Hemorrhoid surgery     hemorrhoidectomy  . Tonsillectomy   . Ventral hernia repair 08/2008    By Dr. Hulen Skains.  . Colon surgery 08/2006    Partial right colectomy and anastomosis with resection of villous adenoma.       Objective:   Physical Exam General: Vital signs reviewed and noted. Well-developed,well-nourished,in no acute distress; alert,appropriate and cooperative throughout examination. Head: normocephalic, atraumatic. Neck: No deformities, masses, or tenderness noted. Lungs: Normal respiratory effort. Clear to auscultation BL without crackles or wheezes.  Heart: RRR. S1 and S2 normal without gallop, murmur, or rubs.  Abdomen: BS normoactive. Soft, Nondistended, non-tender.  No masses or organomegaly. Extremities: No pretibial edema.  Right Upper Extremity: Tenderness of palpation over right rhomboids, although pain was difficult to localize. ROM equal and unrestricted bilateral UE. No joint tenderness. No inflammation, skin changes, or warmth of skin over right shoulder or rhomboid musculature.     Assessment & Plan:  Case and plan of care discussed with Dr. Lane Hacker.

## 2010-10-01 LAB — MICROALBUMIN / CREATININE URINE RATIO
Creatinine, Urine: 181.7 mg/dL
Microalb, Ur: 16.77 mg/dL — ABNORMAL HIGH (ref 0.00–1.89)

## 2010-10-02 ENCOUNTER — Ambulatory Visit (HOSPITAL_COMMUNITY)
Admission: RE | Admit: 2010-10-02 | Discharge: 2010-10-02 | Disposition: A | Discharge status: Home / Self Care | Payer: Medicare Other | Source: Ambulatory Visit | Attending: Internal Medicine | Admitting: Internal Medicine

## 2010-10-02 ENCOUNTER — Other Ambulatory Visit: Payer: Self-pay | Admitting: Internal Medicine

## 2010-10-02 DIAGNOSIS — Q619 Cystic kidney disease, unspecified: Secondary | ICD-10-CM | POA: Insufficient documentation

## 2010-10-02 DIAGNOSIS — R1011 Right upper quadrant pain: Secondary | ICD-10-CM | POA: Insufficient documentation

## 2010-10-05 LAB — GLUCOSE, CAPILLARY: Glucose-Capillary: 134 mg/dL — ABNORMAL HIGH (ref 70–99)

## 2010-10-22 ENCOUNTER — Encounter: Payer: Self-pay | Admitting: Internal Medicine

## 2010-10-22 ENCOUNTER — Ambulatory Visit (INDEPENDENT_AMBULATORY_CARE_PROVIDER_SITE_OTHER): Payer: Medicare Other | Admitting: Internal Medicine

## 2010-10-22 VITALS — BP 124/78 | HR 88 | Temp 98.5°F | Ht 66.0 in | Wt 189.8 lb

## 2010-10-22 DIAGNOSIS — E119 Type 2 diabetes mellitus without complications: Secondary | ICD-10-CM

## 2010-10-22 DIAGNOSIS — E782 Mixed hyperlipidemia: Secondary | ICD-10-CM

## 2010-10-22 DIAGNOSIS — I10 Essential (primary) hypertension: Secondary | ICD-10-CM

## 2010-10-22 LAB — GLUCOSE, CAPILLARY: Glucose-Capillary: 164 mg/dL — ABNORMAL HIGH (ref 70–99)

## 2010-10-22 MED ORDER — GLUCOSE BLOOD VI STRP: ORAL_STRIP | Status: DC

## 2010-10-22 NOTE — Assessment & Plan Note (Addendum)
Review her glucometer so for fasting sugars are between 120-140 however she does not appear to have severe hyperglycemia. Patient is clearing we will control with diet management for her diabetes. Her she might benefit from metformin as she appears to be prediabetic. I would defer this to her primary care provider with whom she has appointment scheduled coming May. She does have a microalbumin to creatinine pressure which is elevated but she is already on lisinopril.

## 2010-10-22 NOTE — Progress Notes (Signed)
  Subjective:    Patient ID: Gina Bailey, female    DOB: 13-Jun-1946, 65 y.o.   MRN: [id number]  HPI Gina Bailey is 25 rolled female with past medical history of diabetes, hypertension, hyperlipidemia, status post partial colectomy and an anastomosis.  Here for a three-week followup appreciated bowel shoulder pain. Patient does not appear to be having any difficulty with her shoulder at this time. She isn't sure what was ailing her. She had extensive workup including ultrasound which was normal except some renal cysts. At this time she does not have any significant complaints.   Review of Systems  All other systems reviewed and are negative.       Objective:   Physical Exam BP 124/78  Pulse 88  Temp(Src) 98.5 F (36.9 C) (Oral)  Ht 5\' 6"  (1.676 m)  Wt 189 lb 12.8 oz (86.093 kg)  BMI 30.63 kg/m2  LMP 10/22/1978  General Appearance:    Alert, cooperative, no distress, appears stated age  Head:    Normocephalic, without obvious abnormality, atraumatic  Eyes:    PERRL, conjunctiva/corneas clear, EOM's intact, fundi    benign, both eyes  Ears:    Normal TM's and external ear canals, both ears  Nose:   Nares normal, septum midline, mucosa normal, no drainage    or sinus tenderness  Throat:   Lips, mucosa, and tongue normal; teeth and gums normal  Neck:   Supple, symmetrical, trachea midline, no adenopathy;    thyroid:  no enlargement/tenderness/nodules; no carotid   bruit or JVD  Back:     Symmetric, no curvature, ROM normal, no CVA tenderness  Lungs:     Clear to auscultation bilaterally, respirations unlabored  Chest Wall:    No tenderness or deformity   Heart:    Regular rate and rhythm, S1 and S2 normal, no murmur, rub   or gallop     Abdomen:     Soft, non-tender, bowel sounds active all four quadrants,    no masses, no organomegaly        Extremities:   Extremities normal, atraumatic, no cyanosis or edema  Pulses:   2+ and symmetric all extremities  Skin:   Skin  color, texture, turgor normal, no rashes or lesions  Lymph nodes:   Cervical, supraclavicular, and axillary nodes normal  Neurologic:   CNII-XII intact, normal strength, sensation and reflexes    throughout          Assessment & Plan:

## 2010-10-22 NOTE — Assessment & Plan Note (Signed)
Harbor pressure is controlled. I will not change her medical management at this time. She is on the  Lisinopril.

## 2010-10-22 NOTE — Assessment & Plan Note (Signed)
She restarted taking her simvastatin. She does not report any muscular skeletal pain at this time. She might need repeat measurement of liver enzymes in 6 months.

## 2010-11-04 LAB — CBC
HCT: 30.1 % — ABNORMAL LOW (ref 36.0–46.0)
Hemoglobin: 10.3 g/dL — ABNORMAL LOW (ref 12.0–15.0)
Hemoglobin: 12.4 g/dL (ref 12.0–15.0)
MCHC: 33.7 g/dL (ref 30.0–36.0)
MCV: 85.2 fL (ref 78.0–100.0)
Platelets: 212 10*3/uL (ref 150–400)
RBC: 4.29 MIL/uL (ref 3.87–5.11)
RDW: 14.3 % (ref 11.5–15.5)
WBC: 8 10*3/uL (ref 4.0–10.5)

## 2010-11-04 LAB — DIFFERENTIAL
Basophils Absolute: 0 10*3/uL (ref 0.0–0.1)
Eosinophils Absolute: 0.2 10*3/uL (ref 0.0–0.7)
Eosinophils Relative: 3 % (ref 0–5)
Lymphocytes Relative: 21 % (ref 12–46)
Lymphocytes Relative: 22 % (ref 12–46)
Lymphs Abs: 1.7 10*3/uL (ref 0.7–4.0)
Monocytes Absolute: 0.7 10*3/uL (ref 0.1–1.0)
Monocytes Absolute: 0.7 10*3/uL (ref 0.1–1.0)
Monocytes Relative: 9 % (ref 3–12)
Neutro Abs: 5.4 10*3/uL (ref 1.7–7.7)
Neutrophils Relative %: 67 % (ref 43–77)

## 2010-11-04 LAB — BASIC METABOLIC PANEL
BUN: 18 mg/dL (ref 6–23)
CO2: 27 mEq/L (ref 19–32)
CO2: 28 mEq/L (ref 19–32)
Calcium: 9.5 mg/dL (ref 8.4–10.5)
Chloride: 101 mEq/L (ref 96–112)
Creatinine, Ser: 1.15 mg/dL (ref 0.4–1.2)
GFR calc Af Amer: 57 mL/min — ABNORMAL LOW (ref >60)
GFR calc non Af Amer: 50 mL/min — ABNORMAL LOW (ref >60)
Glucose, Bld: 118 mg/dL — ABNORMAL HIGH (ref 70–99)
Potassium: 3.5 mEq/L (ref 3.5–5.1)
Sodium: 135 mEq/L (ref 135–145)
Sodium: 142 mEq/L (ref 135–145)

## 2010-12-02 NOTE — Op Note (Signed)
Gina Bailey, Gina Bailey               ACCOUNT NO.:  [id number]   MEDICAL RECORD NO.:  [id number]          PATIENT TYPE:  AMB   LOCATION:  SDS                          FACILITY:  [facility]   PHYSICIAN:  Gwenyth Ober, M.D.    DATE OF BIRTH:  08/08/1945   DATE OF PROCEDURE:  DATE OF DISCHARGE:                               OPERATIVE REPORT   PREOPERATIVE DIAGNOSES:  Incisional ventral hernia.   POSTOPERATIVE DIAGNOSES:  Incisional ventral hernia with an 18 x 25 cm  defect.   PROCEDURE:  Laparoscopic ventral hernia repair with Proceed mesh.   SURGEON:  Kathryne Eriksson. Hulen Skains, M.D.   ASSISTANT:  Dr. Durward Fortes.   ANESTHESIA:  General endotracheal.   ESTIMATED BLOOD LOSS:  Less than a 100 mL.   COMPLICATIONS:  None.   CONDITION:  Stable.   FINDINGS:  A 20 x 30 cm peice of Proceed mesh was implanted.   INDICATIONS FOR OPERATION:  The patient is an 65 year old with a upper  incisional ventral hernia who comes in now for a repair.   OPERATION:  The patient was taken to the operating room and placed on  the table in the supine position.  After an adequate general  endotracheal anesthetic was administered, she was prepped and draped in  the usual sterile manner exposing the entire abdomen.   A Hasson cannula was placed in the right mid-to-lower abdominal wall  area by making a transverse incision dissecting down to the anterior  rectus sheath.  We isolated the sheath and made an incision into the  sheath transversely, then opened down to the posterior sheath which we  grabbed with a Kocher clamp.  We incised in the posterior sheath and  exposed the peritoneal cavity.  There was some bowel stuck up to the  wall where the Kocher clamp had pulled it up and we released that  grabbing the edges of the posterior sheath.  A pursestring suture of 0  Vicryl was passed around the anterior and the posterior sheath securing  an Hasson cannula which was then passed into the left lower quadrant.   We  insufflated carbon dioxide gas up to a maximal intraabdominal  pressure of 15 mmHg.  We then passed the left lower quadrant 5 mm  cannula directly into the peritoneal cavity.  It was through that a  dissector was used to remove the omentum which was actually incarcerated  and the ventral hernia defect which measured subsequently at 18 x 25 cm  in size.   We dissected free most of the omentum from the hernia defect to pass a  right lower quadrant 5 mm cannula and subsequently placed a right upper  quadrant 10 mm OptiView cannula under direct vision.  Once the entire  omentum was removed from the sac we saw that the defect did extend up to  the falciform ligament.  We dissected this down and on to the upper  surface of liver allowing adequate exposure for placement of a mesh.   After we completely dissected down the omentum and the falciform  ligament, we measured the size of  the defect and found that a 20 x 30 cm  piece of Proceed mesh was most appropriate.  We cut it to the proper  contour.  We marked it A, B, and X for the upper right lateral, and  inferior aspect.  The side on the left was close to the cannula on that  side and had to be placed after the cannula was removed.   Novafil 0 sutures were placed at the U stitch around the edges of the  mesh at the A, B, and X and the C side which was over on the  on the  left side but was not marked.  We also placed intervening stitches for  total of 8.  We then rolled up the mesh which had been soaked in  antibiotic solution and poured it down in to the peritoneal cavity  through the cannula site in the left mid lateral aspect of the abdominal  wall.  We got all sutures in place and replaced the cannula in place.  We then unfolded the mesh inside the peritoneal cavity leaving the rough  side of the mesh up towards the anterior abdominal wall.  We retrieved  the sutures using a suture retriever at 5 cm from the marked edges of  the hernia  defect.  These were pulled up towards anterior abdominal wall  and then tied down with 3 of the 8 stitches actually breaking in the  hole and having to be replaced subsequent to tacking down the mesh.   Once we had the mesh unfolded and tacked down with the sutures that we  placed, we used an spiral tacker, tacked down the mesh circumferentially  noting that some of the sutures that had been used to tack down before  were bundling the mesh too much and had to be replaced.  We actually did  replace several of these and tacked them down with tackers.  We used a  suture passer in order to go through the mesh and then bring it out to  the abdominal wall at the upper mid in the right and lateral left side.  Three such sutures were placed under direct vision.  There was no injury  to the bowel during this process.   We tacked the mesh circumferentially taking out the redundancy while  using a tacker.  Once we had adequately tacked it down and also replaced  the sutures this time with 0 Prolene suture.  We aspirated all fluid and  gas from the peritoneal cavity and allowed the gas to escape.  We  removed all cannulas.   The two 10-mm cannula sites were closed using running subcuticular  stitch of 4-0 Monocryl.  Marcaine was used at all sites. The other sites  were closed with Dermabond, Steri-Strips, and Tegaderm.  All needle  counts, sponges counts, and instruments counts were correct.      Gwenyth Ober, M.D.  Electronically Signed     JOW/MEDQ  D:  08/31/2008  T:  09/01/2008  Job:  [id number]

## 2010-12-05 NOTE — Op Note (Signed)
NAMEMILLEE, RYSAVY               ACCOUNT NO.:  [id number]   MEDICAL RECORD NO.:  [id number]          PATIENT TYPE:  INP   LOCATION:  2550                         FACILITY:  [facility]   PHYSICIAN:  Gwenyth Ober, M.D.    DATE OF BIRTH:  January 16, 1946   DATE OF PROCEDURE:  09/09/2006  DATE OF DISCHARGE:                               OPERATIVE REPORT   PREOPERATIVE DIAGNOSIS:  Villous adenoma of the right colon.   POSTOPERATIVE DIAGNOSIS:  Villous adenoma of the right colon.   PROCEDURE:  Partial right colectomy.   SURGEON:  Gwenyth Ober, M.D.   ASSISTANT:  Haywood Lasso, M.D.   ANESTHESIA:  General endotracheal.   ESTIMATED BLOOD LOSS:  Less than 50 mL.   COMPLICATIONS:  None.   CONDITION:  Stable.   FINDINGS:  The patient had a pedunculated polyp of the ascending colon  on a stalk without evidence of any spread.  There was no liver or  gallbladder involvement.  The small bowel was normal.   INDICATIONS FOR OPERATION:  The patient is a 65 year old female who had  a polyp noted on colonoscopy.  Biopsy was benign, and she comes in now  for colectomy.   OPERATION:  The patient was taken to the operating room and placed on  the table in the supine position.  After an endotracheal anesthetic was  administered, she was prepped and draped in the usual sterile manner,  exposing the midline of the abdomen.   A midline incision was made using #10 blade and taken down to the  midline fascia.  This was taken above and below the umbilicus and down  through the midline fascia.  With primarily a Richardson retractor in  place and some left-sided tilt, we took down the right colon from the  line of Toldt.  We could palpate the large polyp through the colonic  wall.  We dissected down the right colon up to the hepatic flexure and  taking down also the reflections tethering the cecum.  The patient had  had a previous appendectomy.   With the tumor being easily palpable through the  wall, we were able to  do a limited colectomy, sparing the ileocecal valve.  We placed a GIA 75  stapler proximal and distal to the tumor and then transected the colon  in those two places.  The mesentery between was taken down with a  LigaSure device.  This freed up the specimen which was sent out, and  then subsequently a functional end-to-end and side-to-side anastomosis  was made using a GIA 75 stapler, with the resulting enterotomy being  closed with a TA 60 stapler with blue 3.5-mm closure staples.  The  mesentery was reapproximated using interrupted 3-0 silk sutures.  Once  this was done, we irrigated with a small amount of saline.  We then  closed the abdomen using running #1 PDS suture.  All counts were  correct.  The skin was closed using stainless steel staples.      Gwenyth Ober, M.D.  Electronically Signed     JOW/MEDQ  D:  09/09/2006  T:  09/09/2006  Job:  [id number]

## 2010-12-05 NOTE — Discharge Summary (Signed)
[name], Gina Bailey               ACCOUNT NO.:  [id number]   MEDICAL RECORD NO.:  [id number]          PATIENT TYPE:  INP   LOCATION:  5702                         FACILITY:  [facility]   PHYSICIAN:  Gwenyth Ober, M.D.    DATE OF BIRTH:  08-09-1945   DATE OF ADMISSION:  09/09/2006  DATE OF DISCHARGE:  09/14/2006                               DISCHARGE SUMMARY   DISCHARGE DIAGNOSIS:  Right colon polyp 4 x 3 x 3 cm submucosal lipoma  with overlying hyperplastic adenomatous changes with some focal  ulceration.   PRINCIPAL PROCEDURE DURING THIS HOSPITALIZATION:  Partial right  colectomy by Dr. Hulen Skains; Dr. Margot Chimes was the assistant.   DISCHARGE MEDICATIONS:  Included all her preoperative medications  including and in addition to Vicodin take as needed for pain.   DIET ON DISCHARGE:  Regular.   CONDITION:  Stable.   She is to follow up to see Dr. Hulen Skains in 1 week.   BRIEF SUMMARY OF HOSPITAL COURSE:  The patient was admitted the day of  her surgery which was on September 09, 2006, at which time she underwent  a partial right colectomy.  The incision was a midline incision.  The  patient tolerated the procedure well, postoperatively did very well.  Postoperative day #1 her abdomen was distended, we decreased her IV  fluids.  She did not have an NG tube in place.  Postoperative day #2 she  started to take a small amount of clear liquids which was increased on  postoperative day #3.  By postoperative day #4 we were able to advance  her to a soft diet and she was able to be discharged on postoperative  day #5.  Her wound was healing well with no evidence of infection.  She  was to follow up see Dr. Hulen Skains in 1 week.      Gwenyth Ober, M.D.  Electronically Signed     JOW/MEDQ  D:  10/20/2006  T:  10/20/2006  Job:  [id number]

## 2010-12-05 NOTE — Procedures (Signed)
[place]  Patient:    Gina Bailey, Gina Bailey Visit Number: [id number] MRN: [id number]          Service Type: END Location: ENDO Attending Physician:  Juanita Craver Dictated by:   Nelwyn Salisbury, M.D. Proc. Date: 06/01/01 Admit Date:  06/01/2001   CC:         Hurshel Party, M.D.   Procedure Report  DATE OF BIRTH:  02/17/1946  PROCEDURE PERFORMED:  Screening flexible sigmoidoscopy up to 100 cm.  ENDOSCOPIST:  Nelwyn Salisbury, M.D.  INSTRUMENT USED:  Olympus video colonoscope.  INDICATION FOR PROCEDURE:  Screening flexible sigmoidoscopy being performed in a 65 year old African-American female.  Rule out colonic polyps, etc.  PREPROCEDURE PREPARATION:  Informed consent was procured from the patient. The patient was fasted for 8 hours prior to the procedure and prepped with Fleets Phospho-Soda the night prior to the procedure.  PREPROCEDURE PHYSICAL:  Patient has stable vital signs.  NECK: Supple.  CHEST:  Clear to auscultation. S1, S2 regular.  ABDOMEN:  Soft with normal bowel sounds.  DESCRIPTION OF PROCEDURE:  The patient was placed in the left lateral decubitus position and sedated with 50 mg of Demerol and 5 mg of Versed intravenously.  Once the patient was adequately sedated and maintained on low-flow oxygen and continuous cardiac monitoring, the Olympus video colonoscope was advanced from the rectum to 100 cm without difficulty.  The patient had a fairly good prep.  Except for an isolated diverticulum in the left colon, no other abnormalities were seen.  IMPRESSION:  Healthy-appearing colon up to 100 cm, except for an isolated diverticulum in the left colon.  RECOMMENDATIONS:  High-fiber diet has been recommended for the patient and repeat colorectal cancer screening was recommended in the next five years unless the patient were develop any abnormal symptoms in the interim. Dictated by:   Nelwyn Salisbury, M.D. Attending  Physician:  Juanita Craver DD:  06/01/01 TD:  06/01/01 Job: [id number]

## 2010-12-05 NOTE — Discharge Summary (Signed)
NAME:  [name], [name] NO.:  [id number]   MEDICAL RECORD NO.:  [id number]          PATIENT TYPE:  INP   LOCATION:  [id number]                         FACILITY:  [facility]   PHYSICIAN:  Gwenyth Ober, M.D.    DATE OF BIRTH:  30-May-1946   DATE OF ADMISSION:  08/31/2008  DATE OF DISCHARGE:  09/03/2008                               DISCHARGE SUMMARY   CHIEF COMPLAINT/REASON FOR ADMISSION:  [name] is a 65 year old  female patient who was admitted to the hospital for elective repair of  incisional ventral hernia.   On the date of admission, the patient was taken to the OR where she  underwent laparoscopic ventral hernia repair with Proceed mesh for an 18  x 25 cm defect.  The patient was subsequently sent to the PACU, then to  the General Floor for recover.   By postoperative day #2, the patient was passing flatus, tolerating a  solid diet but has had difficulty transitioning to appropriate p.o. pain  medications for adequate pain control.  By postop day #2, there was  consideration to send the patient home if we could transition her from  IV to p.o. pain medications.  In addition to IV PCA, she was on Toradol  to help with transition of pain, so these medications were changed to  Percocet and ibuprofen.  The patient did improve enough by postoperative  day #3 to discharge home.  On Dr. Richarda Blade exam, her abdomen was clean,  dry, and intact from an incisional standpoint.  She was tolerating a  solid diet and had bowel sounds appropriately and was passing flatus and  stools.   DISCHARGE DIAGNOSES:  Elective repair of ventral incisional hernia with  mesh.   DISCHARGE MEDICATIONS:  The patient will resume the following home  medications:  1. Lisinopril 40 mg daily.  2. Hydrochlorothiazide 25 mg daily.  3. Amlodipine 10 mg daily.  4. Align probiotic supplement daily.   New medications include:  1. Percocet 5/325 1-2 tabs every 4 hours as needed for pain.  2. Ibuprofen  600 mg 1 tab every 8 hours as needed for pain, may take      in addition of Percocet, always take with food or snack.   OTHER INSTRUCTIONS:  No restriction on her diet.   ACTIVITY:  Increase activity slowly.  May walk up steps.  No lifting  greater than 10 pounds for 6 weeks.  No driving for 1 week.   WOUND CARE:  Wear abdominal binder except for bath until seen by Dr.  Hulen Skains.   FOLLOWUP APPOINTMENTS:  She needs to call Dr. Richarda Blade office at [phone]  to be seen in 2 weeks for hospital followup.      [place] Lissa Merlin, N.P.      Gwenyth Ober, M.D.  Electronically Signed    ALE/MEDQ  D:  10/08/2008  T:  10/09/2008  Job:  [id number]

## 2011-02-05 ENCOUNTER — Telehealth: Payer: Self-pay | Admitting: Dietician

## 2011-02-05 NOTE — Telephone Encounter (Signed)
[employer] making her change to [employer] chek meter for them to pay for her supplies. Needs strips to get by until her 02/24/11 appointment. Will leave bottle of strips at front desk. Provide training on new accu-chek meter at 8/7 appointment.

## 2011-02-24 ENCOUNTER — Ambulatory Visit (INDEPENDENT_AMBULATORY_CARE_PROVIDER_SITE_OTHER): Payer: Medicare Other | Admitting: Dietician

## 2011-02-24 ENCOUNTER — Ambulatory Visit (INDEPENDENT_AMBULATORY_CARE_PROVIDER_SITE_OTHER): Payer: Medicare Other | Admitting: Internal Medicine

## 2011-02-24 ENCOUNTER — Encounter: Payer: Self-pay | Admitting: Internal Medicine

## 2011-02-24 VITALS — BP 143/93 | HR 91 | Temp 98.7°F | Ht 66.0 in | Wt 190.5 lb

## 2011-02-24 DIAGNOSIS — I10 Essential (primary) hypertension: Secondary | ICD-10-CM

## 2011-02-24 DIAGNOSIS — C189 Malignant neoplasm of colon, unspecified: Secondary | ICD-10-CM

## 2011-02-24 DIAGNOSIS — E782 Mixed hyperlipidemia: Secondary | ICD-10-CM

## 2011-02-24 DIAGNOSIS — D374 Neoplasm of uncertain behavior of colon: Secondary | ICD-10-CM

## 2011-02-24 DIAGNOSIS — E119 Type 2 diabetes mellitus without complications: Secondary | ICD-10-CM

## 2011-02-24 DIAGNOSIS — E785 Hyperlipidemia, unspecified: Secondary | ICD-10-CM

## 2011-02-24 LAB — POCT GLYCOSYLATED HEMOGLOBIN (HGB A1C): Hemoglobin A1C: 7.3

## 2011-02-24 MED ORDER — SIMVASTATIN 20 MG PO TABS: 20.0000 mg | ORAL_TABLET | Freq: Every evening | ORAL | Status: DC

## 2011-02-24 NOTE — Progress Notes (Signed)
  Subjective:    Patient ID: Gina Bailey, female    DOB: 1945/12/18, 65 y.o.   MRN: [id number]  HPI Patient is 65 yo female with PMH of diabetes, hypertension, hyperlipidemia, s/p of partial colectomy with anastomosis, who presents for a follow-up visit. She does not have any complaints. She has been taking her medications regularly and monitors her blood glucose level daily. She did not notice any side effects from her current medications. She denies any changes in her vision, any tingling sensations and numbness in her legs, any fluid retention in her ankles or any rashes in her body. She wants to have some medication be refilled   Review of Systems ROS:  General: no fevers, chills, changes in weight, changes in appetite Skin: no rash HEENT: no blurry vision, hearing changes, sore throat Pulm: no dyspnea, coughing, wheezing CV: no chest pain, palpitations, shortness of breath Abd: no abdominal pain, nausea/vomiting, diarrhea/constipation GU: no dysuria, hematuria, polyuria Ext: no arthralgias, myalgias Neuro: no weakness, numbness, or tingling     Objective:   Physical Exam  General: alert, well-developed, and cooperative to examination.  Head: normocephalic and atraumatic.  Eyes: vision grossly intact, pupils equal, pupils round, pupils reactive to light, no injection and anicteric.  Mouth: pharynx pink and moist, no erythema, and no exudates.  Neck: supple, full ROM, no thyromegaly, no JVD, and no carotid bruits.  Lungs: normal respiratory effort, no accessory muscle use, normal breath sounds, no crackles, and no wheezes. Heart: normal rate, regular rhythm, no murmur, no gallop, and no rub.  Abdomen: soft, non-tender, normal bowel sounds, no distention, no guarding, no rebound tenderness, no hepatomegaly, and no splenomegaly.  Msk: no joint swelling, no joint warmth, and no redness over joints.  Pulses: 2+ DP/PT pulses bilaterally Extremities: No cyanosis, clubbing,  edema Neurologic: alert & oriented X3, cranial nerves II-XII intact, strength normal in all extremities, sensation intact to light touch, and gait normal.  Skin: turgor normal and no rashes.  Psych: Oriented X3, memory intact for recent and remote, normally interactive, good eye contact, not anxious appearing, and not depressed appearing.        Assessment & Plan:

## 2011-02-24 NOTE — Assessment & Plan Note (Signed)
Patient is not on any medication. 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 Today her A1c is 7.3 and glucose is 132. Patient is encouraged to continue exercising. Will follow up.

## 2011-02-24 NOTE — Patient Instructions (Signed)
1. Please come back to do your lipid test tomorrow, do not eat food in the morning. 2. Please take your medications regularly and keep doing exercise.

## 2011-02-24 NOTE — Assessment & Plan Note (Signed)
Patient had partial colectomy with anastomosis due to a large adenomatour polyp on year 2008. Since then, she did not have colonoscopy. Patient does not have any symptoms, no blood in her stool. I would like to ask her surgeon's opinion whether she needs to repeat colonoscopy.

## 2011-02-24 NOTE — Assessment & Plan Note (Signed)
Patient is on Zocor. She did not notice any side effect from this medication. Lipid panel was ordered today. Will follow up.

## 2011-02-24 NOTE — Assessment & Plan Note (Addendum)
Patient is on amlodpin, HCTZ and lisinopril. Today, her first measurement of Bp was 143/93 mmHg. The second measurement after 20 min was 120/70 mmHg. Will continue her current regimen and follow up. BMP was ordered today.

## 2011-02-25 ENCOUNTER — Other Ambulatory Visit: Payer: Medicare Other

## 2011-02-25 DIAGNOSIS — I10 Essential (primary) hypertension: Secondary | ICD-10-CM

## 2011-02-25 DIAGNOSIS — E119 Type 2 diabetes mellitus without complications: Secondary | ICD-10-CM

## 2011-02-25 NOTE — Progress Notes (Signed)
Interviewed patient with Dr. Blaine Hamper.  Agree with plans and notes.

## 2011-02-26 LAB — LIPID PANEL
HDL: 53 mg/dL (ref >39)
LDL Cholesterol: 85 mg/dL (ref 0–99)
Total CHOL/HDL Ratio: 2.8 Ratio

## 2011-02-26 LAB — BASIC METABOLIC PANEL WITH GFR
BUN: 15 mg/dL (ref 6–23)
Chloride: 102 mEq/L (ref 96–112)
Creat: 1.09 mg/dL (ref 0.50–1.10)
GFR, Est African American: >60 mL/min (ref >60)
Glucose, Bld: 126 mg/dL — ABNORMAL HIGH (ref 70–99)
Potassium: 3.4 mEq/L — ABNORMAL LOW (ref 3.5–5.3)

## 2011-03-16 ENCOUNTER — Telehealth: Payer: Self-pay | Admitting: Dietician

## 2011-03-16 NOTE — Telephone Encounter (Signed)
Patient called to say she only got 10 complimentary strips from [employer]. Suggested she call them via the toll free number on the back of her meter. Patient said she had enough strips to get her through until next visit. She has refills at her pharmacy on test strips through 10/2011.

## 2011-03-26 NOTE — Progress Notes (Signed)
Medical Nutrition Therapy:  Appt start time: 1430 end time:  1500.  Assessment:  Primary concerns today: Weight management and Blood sugar control A1C increased. Weight stable ~ 188-190#. Patient reports her activity level has declined due to new grand baby.  Usual eating pattern without change: includes Meal 3 and 0+ snacks per day.    Avoided foods include: cookies and candy, soda, sweetened cereal and ice cream and desserts  Usual physical activity includes walking 30 minutes a day 2 - 5 days per week    Progress Towards Goal(s):  Some progress   Nutritional Diagnosis:  NB-2.1 Physical inactivity As related to needing more activity to acheive stated goals.  As evidenced by increased weight and blood sugars.  Interventions: 1- Discussed current A1C and weight in relation to previous values 2- Used Goal setting to assist patient to increase her physical activity back to the level where it was when she was achieving her stated goals. 3-Assisted patient with information about testing supplies.  4- will follow up by phone in 2 months    Monitoring/Evaluation:  Dietary intake/ blood sugars/ exercise history  in 6 month(s)

## 2011-03-30 ENCOUNTER — Ambulatory Visit (INDEPENDENT_AMBULATORY_CARE_PROVIDER_SITE_OTHER): Payer: Medicare Other | Admitting: Internal Medicine

## 2011-03-30 ENCOUNTER — Encounter: Payer: Self-pay | Admitting: Internal Medicine

## 2011-03-30 DIAGNOSIS — Z23 Encounter for immunization: Secondary | ICD-10-CM

## 2011-03-30 NOTE — Progress Notes (Signed)
Appointment scheduled in error.  Pt states she was here to get a flu vaccine only.  Will f/u chronic health issues in with pcp in 3mths. No active complaints today.Regenia Skeeter, Darlene Cassady9/10/20129:25 AM

## 2011-05-26 ENCOUNTER — Other Ambulatory Visit: Payer: Self-pay | Admitting: *Deleted

## 2011-05-26 DIAGNOSIS — E782 Mixed hyperlipidemia: Secondary | ICD-10-CM

## 2011-05-26 DIAGNOSIS — I10 Essential (primary) hypertension: Secondary | ICD-10-CM

## 2011-05-26 DIAGNOSIS — E119 Type 2 diabetes mellitus without complications: Secondary | ICD-10-CM

## 2011-05-26 MED ORDER — AMLODIPINE BESYLATE 10 MG PO TABS: 10.0000 mg | ORAL_TABLET | Freq: Every day | ORAL | Status: DC

## 2011-05-26 MED ORDER — HYDROCHLOROTHIAZIDE 25 MG PO TABS: 25.0000 mg | ORAL_TABLET | Freq: Every day | ORAL | Status: DC

## 2011-05-26 MED ORDER — LISINOPRIL 40 MG PO TABS: 40.0000 mg | ORAL_TABLET | Freq: Every day | ORAL | Status: DC

## 2011-05-26 MED ORDER — SIMVASTATIN 20 MG PO TABS: 20.0000 mg | ORAL_TABLET | Freq: Every evening | ORAL | Status: DC

## 2011-05-26 NOTE — Telephone Encounter (Signed)
?   pharmacy

## 2011-05-27 ENCOUNTER — Encounter (HOSPITAL_COMMUNITY): Payer: Self-pay | Admitting: *Deleted

## 2011-05-27 ENCOUNTER — Emergency Department (HOSPITAL_COMMUNITY): Payer: Medicare Other

## 2011-05-27 ENCOUNTER — Emergency Department (HOSPITAL_COMMUNITY)
Admission: EM | Admit: 2011-05-27 | Discharge: 2011-05-27 | Disposition: A | Discharge status: Home / Self Care | Payer: Medicare Other | Attending: Emergency Medicine | Admitting: Emergency Medicine

## 2011-05-27 DIAGNOSIS — E119 Type 2 diabetes mellitus without complications: Secondary | ICD-10-CM | POA: Insufficient documentation

## 2011-05-27 DIAGNOSIS — M546 Pain in thoracic spine: Secondary | ICD-10-CM | POA: Insufficient documentation

## 2011-05-27 DIAGNOSIS — Z9889 Other specified postprocedural states: Secondary | ICD-10-CM | POA: Insufficient documentation

## 2011-05-27 DIAGNOSIS — I10 Essential (primary) hypertension: Secondary | ICD-10-CM | POA: Insufficient documentation

## 2011-05-27 DIAGNOSIS — M25559 Pain in unspecified hip: Secondary | ICD-10-CM | POA: Insufficient documentation

## 2011-05-27 LAB — URINE MICROSCOPIC-ADD ON

## 2011-05-27 LAB — URINALYSIS, ROUTINE W REFLEX MICROSCOPIC
Hgb urine dipstick: NEGATIVE
Nitrite: NEGATIVE
Specific Gravity, Urine: 1.011 (ref 1.005–1.030)
Urobilinogen, UA: 0.2 mg/dL (ref 0.0–1.0)
pH: 7.5 (ref 5.0–8.0)

## 2011-05-27 MED ORDER — HYDROCODONE-ACETAMINOPHEN 5-325 MG PO TABS
1.0000 | ORAL_TABLET | Freq: Once | ORAL | Status: AC
  Administered 2011-05-27: 1 via ORAL
  Filled 2011-05-27: qty 1

## 2011-05-27 MED ORDER — HYDROCODONE-ACETAMINOPHEN 5-500 MG PO TABS: 1.0000 | ORAL_TABLET | Freq: Four times a day (QID) | ORAL | Status: DC | PRN

## 2011-05-27 MED ORDER — IBUPROFEN 800 MG PO TABS: 800.0000 mg | ORAL_TABLET | Freq: Three times a day (TID) | ORAL | Status: DC

## 2011-05-27 NOTE — Telephone Encounter (Signed)
Rxs faxed to [facility] per pt's request [phone]).
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
## 2011-05-27 NOTE — ED Notes (Signed)
Patient back from x-ray.  Patient currently resting quietly in bed; no respiratory or acute distress noted.  Family present at bedside.  Patient given warm blanket and updated on plan of care; informed patient that we are waiting for x-ray results to come back and be read by the physician.  Patient has no other questions, concerns, or requests at this time. Will continue to monitor.

## 2011-05-27 NOTE — ED Notes (Signed)
Pt discharged home. Had no further questions regarding discharge/prescriptions.

## 2011-05-27 NOTE — ED Notes (Signed)
Pt resting quietly at the time. Vital signs stable. Pt remains alert and oriented x4.
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
## 2011-05-28 NOTE — ED Provider Notes (Signed)
  I performed a history and physical examination of Gina Bailey and discussed her management with Dr.  Louie Bun.  I agree with the history, physical, assessment, and plan of care, with the following exceptions: None  I was present for the following procedures: None Time Spent in Critical Care of the patient: None Time spent in discussions with the patient and family:10  Joanny Dupree, Gaetano Hawthorne, MD 05/28/11 2333

## 2011-06-05 ENCOUNTER — Encounter: Payer: Self-pay | Admitting: Internal Medicine

## 2011-06-05 ENCOUNTER — Encounter: Payer: Medicare Other | Admitting: Internal Medicine

## 2011-06-05 ENCOUNTER — Ambulatory Visit (INDEPENDENT_AMBULATORY_CARE_PROVIDER_SITE_OTHER): Payer: Medicare Other | Admitting: Internal Medicine

## 2011-06-05 DIAGNOSIS — Z79899 Other long term (current) drug therapy: Secondary | ICD-10-CM

## 2011-06-05 DIAGNOSIS — E119 Type 2 diabetes mellitus without complications: Secondary | ICD-10-CM

## 2011-06-05 LAB — GLUCOSE, CAPILLARY: Glucose-Capillary: 101 mg/dL — ABNORMAL HIGH (ref 70–99)

## 2011-06-05 MED ORDER — HYDROCODONE-ACETAMINOPHEN 5-500 MG PO TABS: 1.0000 | ORAL_TABLET | Freq: Four times a day (QID) | ORAL | Status: AC | PRN

## 2011-06-05 MED ORDER — BACLOFEN 10 MG PO TABS: 10.0000 mg | ORAL_TABLET | Freq: Two times a day (BID) | ORAL | Status: DC
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
## 2011-06-05 NOTE — Assessment & Plan Note (Signed)
I will prescribe her hydrocodone-tylenol and baclofen for pain relief and muscle spasm respectively. Patient was also offered physical therapy sessions since she lives by herself and may go downhill if she does not continue to be physically active.  Patient is agreeable with the plan and will followup with the clinic as needed.

## 2011-06-22 ENCOUNTER — Other Ambulatory Visit: Payer: Self-pay | Admitting: Internal Medicine

## 2011-06-22 DIAGNOSIS — Z1231 Encounter for screening mammogram for malignant neoplasm of breast: Secondary | ICD-10-CM

## 2011-06-29 ENCOUNTER — Ambulatory Visit (INDEPENDENT_AMBULATORY_CARE_PROVIDER_SITE_OTHER): Payer: Medicare Other | Admitting: Internal Medicine

## 2011-06-29 ENCOUNTER — Encounter: Payer: Self-pay | Admitting: Internal Medicine

## 2011-06-29 DIAGNOSIS — M549 Dorsalgia, unspecified: Secondary | ICD-10-CM

## 2011-06-29 MED ORDER — IBUPROFEN 600 MG PO TABS: 600.0000 mg | ORAL_TABLET | Freq: Four times a day (QID) | ORAL | Status: AC | PRN

## 2011-06-29 MED ORDER — BACLOFEN 10 MG PO TABS: 10.0000 mg | ORAL_TABLET | Freq: Two times a day (BID) | ORAL | Status: DC
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
## 2011-06-29 NOTE — Assessment & Plan Note (Signed)
Patient is still complaining of aches and pains at multiple locations although they are better then when I saw her at last office visit. I will stop vicodin at this time. Give her ibuprofen 600 mg #30 as needed for pain control. Baclofen 10 mg twice a day for muscle pain. Drink a lot of fluids and follow up as necessary. She will start PT on 07/07/2011.
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
## 2011-07-07 ENCOUNTER — Ambulatory Visit: Payer: Medicare Other | Attending: Internal Medicine

## 2011-07-07 ENCOUNTER — Ambulatory Visit: Payer: Medicare Other

## 2011-07-07 DIAGNOSIS — R262 Difficulty in walking, not elsewhere classified: Secondary | ICD-10-CM | POA: Insufficient documentation

## 2011-07-07 DIAGNOSIS — IMO0001 Reserved for inherently not codable concepts without codable children: Secondary | ICD-10-CM | POA: Insufficient documentation

## 2011-07-07 DIAGNOSIS — M545 Low back pain, unspecified: Secondary | ICD-10-CM | POA: Insufficient documentation

## 2011-07-08 ENCOUNTER — Ambulatory Visit: Payer: Medicare Other | Admitting: Rehabilitation

## 2011-07-20 ENCOUNTER — Ambulatory Visit
Admission: RE | Admit: 2011-07-20 | Discharge: 2011-07-20 | Disposition: A | Discharge status: Home / Self Care | Payer: Medicare Other | Source: Ambulatory Visit | Attending: Family Medicine | Admitting: Family Medicine

## 2011-07-20 DIAGNOSIS — Z1231 Encounter for screening mammogram for malignant neoplasm of breast: Secondary | ICD-10-CM

## 2011-07-22 ENCOUNTER — Ambulatory Visit: Payer: Medicare Other | Attending: Internal Medicine | Admitting: Rehabilitation

## 2011-07-22 ENCOUNTER — Other Ambulatory Visit: Payer: Self-pay | Admitting: *Deleted

## 2011-07-22 DIAGNOSIS — M545 Low back pain, unspecified: Secondary | ICD-10-CM | POA: Insufficient documentation

## 2011-07-22 DIAGNOSIS — R262 Difficulty in walking, not elsewhere classified: Secondary | ICD-10-CM | POA: Insufficient documentation

## 2011-07-22 DIAGNOSIS — IMO0001 Reserved for inherently not codable concepts without codable children: Secondary | ICD-10-CM | POA: Insufficient documentation

## 2011-07-22 MED ORDER — LANCETS 30G MISC: 1.0000 | Freq: Three times a day (TID) | Status: DC

## 2011-07-22 MED ORDER — GLUCOSE BLOOD VI STRP: ORAL_STRIP | Status: DC

## 2011-07-22 NOTE — Telephone Encounter (Signed)
Pt needs test strips for the accu chek nano, please complete

## 2011-07-24 ENCOUNTER — Ambulatory Visit: Payer: Medicare Other

## 2011-07-27 ENCOUNTER — Ambulatory Visit: Payer: Medicare Other

## 2011-07-28 ENCOUNTER — Encounter: Payer: Self-pay | Admitting: Internal Medicine

## 2011-07-28 ENCOUNTER — Ambulatory Visit (INDEPENDENT_AMBULATORY_CARE_PROVIDER_SITE_OTHER): Payer: Medicare Other | Admitting: Internal Medicine

## 2011-07-28 VITALS — BP 144/92 | HR 85 | Temp 98.7°F | Ht 66.0 in | Wt 181.6 lb

## 2011-07-28 DIAGNOSIS — E119 Type 2 diabetes mellitus without complications: Secondary | ICD-10-CM

## 2011-07-28 DIAGNOSIS — I10 Essential (primary) hypertension: Secondary | ICD-10-CM

## 2011-07-28 MED ORDER — HYDROCHLOROTHIAZIDE 25 MG PO TABS: 25.0000 mg | ORAL_TABLET | Freq: Every day | ORAL | Status: DC

## 2011-07-28 NOTE — Patient Instructions (Addendum)
1. You did very good job in taking your health. I appreciate your effort. Please continue doing the same. I do not need to adjust your medications today. 2. Please take all medications as prescribed.  3. If you have worsening of your symptoms or new symptoms arise, please call the clinic [phone]), or go to the ER immediately if symptoms are severe.
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
## 2011-07-29 NOTE — Progress Notes (Signed)
I agree with Dr. Niu's assessment and plan. 

## 2011-08-03 ENCOUNTER — Ambulatory Visit: Payer: Medicare Other

## 2011-08-05 ENCOUNTER — Ambulatory Visit: Payer: Medicare Other | Admitting: Rehabilitation

## 2011-08-06 ENCOUNTER — Encounter: Payer: Medicare Other | Admitting: Rehabilitation

## 2011-08-11 ENCOUNTER — Ambulatory Visit: Payer: Medicare Other

## 2011-08-17 ENCOUNTER — Ambulatory Visit: Payer: Medicare Other

## 2011-08-18 ENCOUNTER — Encounter: Payer: Medicare Other | Admitting: Rehabilitation

## 2011-08-21 ENCOUNTER — Ambulatory Visit: Payer: Medicare Other | Attending: Internal Medicine

## 2011-08-21 DIAGNOSIS — M545 Low back pain, unspecified: Secondary | ICD-10-CM | POA: Insufficient documentation

## 2011-08-21 DIAGNOSIS — R262 Difficulty in walking, not elsewhere classified: Secondary | ICD-10-CM | POA: Insufficient documentation

## 2011-08-21 DIAGNOSIS — IMO0001 Reserved for inherently not codable concepts without codable children: Secondary | ICD-10-CM | POA: Insufficient documentation

## 2011-08-25 ENCOUNTER — Ambulatory Visit: Payer: Medicare Other

## 2011-08-27 ENCOUNTER — Ambulatory Visit: Payer: Medicare Other

## 2011-09-08 ENCOUNTER — Ambulatory Visit (HOSPITAL_COMMUNITY)
Admission: RE | Admit: 2011-09-08 | Discharge: 2011-09-08 | Disposition: A | Discharge status: Home / Self Care | Payer: Medicare Other | Source: Ambulatory Visit | Attending: Internal Medicine | Admitting: Internal Medicine

## 2011-09-08 ENCOUNTER — Encounter: Payer: Self-pay | Admitting: Internal Medicine

## 2011-09-08 ENCOUNTER — Ambulatory Visit (INDEPENDENT_AMBULATORY_CARE_PROVIDER_SITE_OTHER): Payer: Medicare Other | Admitting: Internal Medicine

## 2011-09-08 VITALS — BP 124/82 | HR 85 | Temp 97.9°F | Ht 66.0 in | Wt 182.1 lb

## 2011-09-08 DIAGNOSIS — M25552 Pain in left hip: Secondary | ICD-10-CM

## 2011-09-08 DIAGNOSIS — M25559 Pain in unspecified hip: Secondary | ICD-10-CM | POA: Insufficient documentation

## 2011-09-08 DIAGNOSIS — I10 Essential (primary) hypertension: Secondary | ICD-10-CM

## 2011-09-08 DIAGNOSIS — E119 Type 2 diabetes mellitus without complications: Secondary | ICD-10-CM

## 2011-09-08 DIAGNOSIS — G8929 Other chronic pain: Secondary | ICD-10-CM | POA: Insufficient documentation

## 2011-09-08 MED ORDER — ACETAMINOPHEN 325 MG PO TABS: 650.0000 mg | ORAL_TABLET | Freq: Three times a day (TID) | ORAL | Status: AC | PRN

## 2011-09-08 MED ORDER — BACLOFEN 10 MG PO TABS: 10.0000 mg | ORAL_TABLET | Freq: Two times a day (BID) | ORAL | Status: AC

## 2011-09-08 NOTE — Patient Instructions (Addendum)
1. Please take tylenol for your hip pain as prescribed. I will let you know the X-ray results and make next plan for your hip pain depending on the X-ray results. 2. Please take all other medications as prescribed.  3. If you have worsening of your symptoms or new symptoms arise, please call the clinic [phone]), or go to the ER immediately if symptoms are severe.
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
## 2011-09-15 ENCOUNTER — Other Ambulatory Visit: Payer: Self-pay | Admitting: *Deleted

## 2011-09-15 DIAGNOSIS — E782 Mixed hyperlipidemia: Secondary | ICD-10-CM

## 2011-09-15 MED ORDER — SIMVASTATIN 20 MG PO TABS: 20.0000 mg | ORAL_TABLET | Freq: Every evening | ORAL | Status: DC

## 2011-09-15 NOTE — Telephone Encounter (Signed)
Simvastatin refilled - rx request form faxed to [facility].

## 2011-09-15 NOTE — Telephone Encounter (Signed)
Refill approved - nurse to complete. 

## 2011-10-06 ENCOUNTER — Ambulatory Visit (INDEPENDENT_AMBULATORY_CARE_PROVIDER_SITE_OTHER): Payer: Medicare Other | Admitting: Internal Medicine

## 2011-10-06 ENCOUNTER — Encounter: Payer: Self-pay | Admitting: Internal Medicine

## 2011-10-06 VITALS — BP 117/79 | HR 89 | Temp 99.1°F | Ht 66.0 in | Wt 180.9 lb

## 2011-10-06 DIAGNOSIS — E119 Type 2 diabetes mellitus without complications: Secondary | ICD-10-CM

## 2011-10-06 DIAGNOSIS — E1142 Type 2 diabetes mellitus with diabetic polyneuropathy: Secondary | ICD-10-CM

## 2011-10-06 DIAGNOSIS — M25559 Pain in unspecified hip: Secondary | ICD-10-CM

## 2011-10-06 DIAGNOSIS — I10 Essential (primary) hypertension: Secondary | ICD-10-CM

## 2011-10-06 DIAGNOSIS — Z79899 Other long term (current) drug therapy: Secondary | ICD-10-CM

## 2011-10-06 DIAGNOSIS — G629 Polyneuropathy, unspecified: Secondary | ICD-10-CM | POA: Insufficient documentation

## 2011-10-06 DIAGNOSIS — E1149 Type 2 diabetes mellitus with other diabetic neurological complication: Secondary | ICD-10-CM
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
## 2011-10-06 NOTE — Patient Instructions (Addendum)
1. Your numbness and tingling sensation in your arms and legs are likely due to peripheral neuropathy. I will check your blood vitamin B12 level to make sure that you do no have Vitamin B12 deficiency. 2. You did good job in taking all your medications. Please continue to take all medications as prescribed.  3. You diabetes is well controlled. From now you can measure your blood sugar weekly.  4. If you have worsening of your symptoms or new symptoms arise, please call the clinic [phone]), or go to the ER immediately if symptoms are severe.

## 2011-10-07 LAB — VITAMIN B12: Vitamin B-12: 458 pg/mL (ref 211–911)

## 2011-11-03 ENCOUNTER — Other Ambulatory Visit: Payer: Self-pay | Admitting: *Deleted

## 2011-11-03 DIAGNOSIS — I10 Essential (primary) hypertension: Secondary | ICD-10-CM

## 2011-11-03 DIAGNOSIS — E119 Type 2 diabetes mellitus without complications: Secondary | ICD-10-CM

## 2011-11-03 MED ORDER — HYDROCHLOROTHIAZIDE 25 MG PO TABS: 25.0000 mg | ORAL_TABLET | Freq: Every day | ORAL | Status: DC

## 2011-11-03 MED ORDER — AMLODIPINE BESYLATE 10 MG PO TABS: 10.0000 mg | ORAL_TABLET | Freq: Every day | ORAL | Status: DC

## 2011-11-03 MED ORDER — LISINOPRIL 40 MG PO TABS: 40.0000 mg | ORAL_TABLET | Freq: Every day | ORAL | Status: DC

## 2011-11-03 NOTE — Telephone Encounter (Signed)
Rx faxed in.

## 2011-12-08 ENCOUNTER — Encounter: Payer: Self-pay | Admitting: Internal Medicine

## 2011-12-08 ENCOUNTER — Ambulatory Visit (INDEPENDENT_AMBULATORY_CARE_PROVIDER_SITE_OTHER): Payer: Medicare Other | Admitting: Dietician

## 2011-12-08 ENCOUNTER — Ambulatory Visit (INDEPENDENT_AMBULATORY_CARE_PROVIDER_SITE_OTHER): Payer: Medicare Other | Admitting: Internal Medicine

## 2011-12-08 VITALS — BP 122/86 | HR 99 | Temp 98.8°F | Ht 66.0 in | Wt 185.0 lb

## 2011-12-08 DIAGNOSIS — E119 Type 2 diabetes mellitus without complications: Secondary | ICD-10-CM

## 2011-12-08 DIAGNOSIS — M25559 Pain in unspecified hip: Secondary | ICD-10-CM

## 2011-12-08 MED ORDER — TRAMADOL HCL 50 MG PO TABS: 50.0000 mg | ORAL_TABLET | Freq: Four times a day (QID) | ORAL | Status: DC | PRN

## 2011-12-08 NOTE — Progress Notes (Signed)
Medical Nutrition Therapy:  Appt start time: 1430 end time:  1500.  Assessment:  Primary concerns today: Weight management and: Blood sugar control.  Patient forgot meter- described home blood sugars as at target. A1C stable off medicine. Weight stable. Patient reports her activity level has declined due to hip pain.  Usual eating pattern without change: healthy and includes dairy, fruit, vegetables and nuts. 3 meals and 2-3 snacks per day.    Avoided foods include: cookies and candy, soda, sweetened cereal and ice cream and desserts  Usual physical activity includes walking around house as  30 minutes a day 2 - 5 days per week    Progress Towards Goal(s):  Some progress   Nutritional Diagnosis:  NB-2.1 Physical inactivity As related to sedentary lifestyle/needing more activity  As evidenced by report of decreased activity without change and increased weight.  Interventions: 1- Discussed current A1C and weight in relation to previous values 2- Discussed ways to assist patient to increase her physical activity. - educated about Silver Sneakers classes. 4- Reviewed food intake which is reportedly at goal.     Monitoring/Evaluation:  Dietary intake/ blood sugars/ exercise history  in 3 month(s)

## 2011-12-08 NOTE — Patient Instructions (Signed)
1. You have done great job in taking all your medications. I appreciate it very much. Please follow-up in the clinic in about 2 months. 2. Please take all medications as prescribed. Please take the Tramadol when you have severe pain in the night. 3. If you have worsening of your symptoms or new symptoms arise, please call the clinic [phone]), or go to the ER immediately if symptoms are sever 4.
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
## 2011-12-16 ENCOUNTER — Other Ambulatory Visit: Payer: Self-pay | Admitting: *Deleted

## 2011-12-16 DIAGNOSIS — E782 Mixed hyperlipidemia: Secondary | ICD-10-CM

## 2011-12-16 MED ORDER — SIMVASTATIN 20 MG PO TABS: 20.0000 mg | ORAL_TABLET | Freq: Every evening | ORAL | Status: DC

## 2011-12-16 NOTE — Telephone Encounter (Signed)
Rx  Called to pharmacy.

## 2012-02-10 ENCOUNTER — Ambulatory Visit (INDEPENDENT_AMBULATORY_CARE_PROVIDER_SITE_OTHER): Payer: Medicare Other | Admitting: Internal Medicine

## 2012-02-10 ENCOUNTER — Encounter: Payer: Self-pay | Admitting: Internal Medicine

## 2012-02-10 VITALS — BP 134/89 | HR 98 | Temp 97.7°F | Resp 20 | Wt 183.6 lb

## 2012-02-10 DIAGNOSIS — M25559 Pain in unspecified hip: Secondary | ICD-10-CM

## 2012-02-10 DIAGNOSIS — I10 Essential (primary) hypertension: Secondary | ICD-10-CM

## 2012-02-10 DIAGNOSIS — E119 Type 2 diabetes mellitus without complications: Secondary | ICD-10-CM

## 2012-02-10 DIAGNOSIS — M25552 Pain in left hip: Secondary | ICD-10-CM

## 2012-02-10 LAB — LIPID PANEL
Cholesterol: 168 mg/dL (ref 0–200)
HDL: 61 mg/dL (ref >39)
Total CHOL/HDL Ratio: 2.8 Ratio
Triglycerides: 49 mg/dL (ref <150)

## 2012-02-10 LAB — POCT GLYCOSYLATED HEMOGLOBIN (HGB A1C): Hemoglobin A1C: 6.7

## 2012-02-10 MED ORDER — TRAMADOL HCL 50 MG PO TABS: 50.0000 mg | ORAL_TABLET | Freq: Four times a day (QID) | ORAL | Status: AC | PRN

## 2012-02-10 NOTE — Patient Instructions (Signed)
1. Please continue to take current medications. When you get MRI done, we will make further adjustment of your treatment.  2. Please take all medications as prescribed.  3. If you have worsening of your symptoms or new symptoms arise, please call the clinic [phone]), or go to the ER immediately if symptoms are severe.
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
## 2012-02-10 NOTE — Assessment & Plan Note (Signed)
It is diet controlled. Today A1c is 6.7. We'll continue diet control.

## 2012-02-10 NOTE — Assessment & Plan Note (Signed)
Blood pressure is well controlled. Today blood pressure is 148/89, repeated blood pressure is 134/89. We'll continue current regimen.

## 2012-02-10 NOTE — Progress Notes (Signed)
Patient ID: Gina Bailey, female   DOB: [birth year], 66 y.o.   MRN: [id number]  Subjective:   [name]   DOB: 05/22/1946 66 y.o.   MRN: [id number]  HPI: [name] is a 66 y.o.  Patient is a 66 year old lady with a past medical history as outlined below, who presents for a follow up visit.  Patient reports that she has been taking all her medications regularly. Her left hip pain is still bothering her. It is not getting any better. She is currently taking Tylenol, baclofen and tramadol. She said the medication helps her partially. Her hip pain disturbs her sleep. Currently her pain is 3-7/10 in severity and non-raditating. It is aggravated by walking.  Denies fever, chills, fatigue, headaches, cough, chest pain, SOB, abdominal pain,diarrhea, constipation, dysuria, urgency, frequency.       Past Medical History  Diagnosis Date  . Hypertension   . Ventricular hypertrophy 05/2006    LVH on ECG (05/2006) - Likely 2/2 HTN.  Marland Kitchen Hypokalemia     recurrent  . Seasonal allergies   . Ventral hernia     Incisional ventral hernia. S/P repair by Dr. Hulen Skains  (08/2008)  . HLD (hyperlipidemia)   . Villous adenoma of colon     Hx of. S/P resection and partial colectomy with anastomosis by Dr. Hulen Skains. (08/2006)  . Diabetes mellitus     Diet-controlled. Not on antiglycemic medications.   Current Outpatient Prescriptions  Medication Sig Dispense Refill  . acetaminophen (TYLENOL) 325 MG tablet Take 2 tablets (650 mg total) by mouth 3 (three) times daily as needed for pain.  100 tablet  2  . amLODipine (NORVASC) 10 MG tablet Take 1 tablet (10 mg total) by mouth daily.  30 tablet  5  . aspirin 81 MG EC tablet Take 81 mg by mouth daily.        . baclofen (LIORESAL) 10 MG tablet Take 1 tablet (10 mg total) by mouth 2 (two) times daily.  60 tablet  3  . bifidobacterium infantis (ALIGN) capsule Take 1 capsule by mouth daily.        . Blood Glucose Monitoring Suppl (ACCU-CHEK NANO  SMARTVIEW) W/DEVICE KIT by Does not apply route 1 day or 1 dose. Uses to check blood sugar once daily in the morning.       . fexofenadine (ALLEGRA) 180 MG tablet Take 180 mg by mouth daily.        Marland Kitchen glucose blood (ACCU-CHEK SMARTVIEW) test strip Use as instructed  100 each  12  . hydrochlorothiazide (HYDRODIURIL) 25 MG tablet Take 1 tablet (25 mg total) by mouth daily.  30 tablet  5  . Lancets 30G MISC 1 each by Other route 3 (three) times daily. Use to check blood sugar as needed  100 each  6  . lisinopril (PRINIVIL,ZESTRIL) 40 MG tablet Take 1 tablet (40 mg total) by mouth daily.  30 tablet  5  . simvastatin (ZOCOR) 20 MG tablet Take 1 tablet (20 mg total) by mouth every evening.  90 tablet  0  . traMADol (ULTRAM) 50 MG tablet Take 1 tablet (50 mg total) by mouth every 6 (six) hours as needed for pain.  30 tablet  0   Family History  Problem Relation Age of Onset  . Diabetes      1st degree reative in 3 of her siblings   History   Social History  . Marital Status: Widowed    Spouse Name:  N/A    Number of Children: N/A  . Years of Education: N/A   Occupational History  . [employer]  . Merchant [employer] officer   Social History Main Topics  . Smoking status: Former Smoker -- 0.0 packs/day for 6 years    Types: Cigarettes    Quit date: 09/05/1999  . Smokeless tobacco: Never Used  . Alcohol Use: No  . Drug Use: No  . Sexually Active: Not Currently   Other Topics Concern  . None   Social History Narrative   Retired from working as a [employer].Widowed. (since 1997). Not sexually active since.Volunteers at [employer].   Review of Systems:  General: no fevers, chills, no changes in body weight, no changes in appetite Skin: no rash HEENT: no blurry vision, hearing changes or sore throat Pulm: no dyspnea, coughing, wheezing CV: no chest pain, palpitations, shortness of breath Abd: no nausea/vomiting, abdominal pain,  diarrhea/constipation GU: no dysuria, hematuria, polyuria Ext: has left hip pain. Neuro: no weakness, numbness, or tingling   Objective:  Physical Exam: Filed Vitals:   02/10/12 0920 02/10/12 0930  BP: 148/98 134/89  Pulse: 98   Temp: 97.7 F (36.5 C)   TempSrc: Oral   Resp: 20   Weight: 183 lb 9.6 oz (83.28 kg)    General: resting in bed, not in acute distress   HEENT: PERRL, EOMI, no scleral icterus   Cardiac: S1/S2, RRR, No murmurs, gallops or rubs   Pulm: Good air movement bilaterally, Clear to auscultation bilaterally, No rales, wheezing, rhonchi or rubs.   Abd: Soft, nondistended, nontender, no rebound pain, no organomegaly, BS present   Ext: No rashes or edema, 2+DP/PT pulse bilaterally. Tender over left hip and lateral upper thigh, there is no motion limitation to all directions. There is no local swelling or redness.   Neuro: alert and oriented X3, cranial nerves II-XII grossly intact, muscle strength 5/5 in all extremeties, sensation to light touch intact.       Assessment & Plan:

## 2012-02-17 ENCOUNTER — Other Ambulatory Visit: Payer: Self-pay | Admitting: Internal Medicine

## 2012-02-17 ENCOUNTER — Telehealth: Payer: Self-pay | Admitting: *Deleted

## 2012-02-17 DIAGNOSIS — I10 Essential (primary) hypertension: Secondary | ICD-10-CM

## 2012-02-17 NOTE — Telephone Encounter (Signed)
Call from [facility] in MRI [phone]  ( or fax order to [phone] ) Pt scheduled for MRI tomorrow and they will need an order for labs, Creatinine.  Last one in chart is 2012 Please place the order.

## 2012-02-17 NOTE — Telephone Encounter (Signed)
Orders placed and MRI informed

## 2012-02-18 ENCOUNTER — Other Ambulatory Visit: Payer: Self-pay | Admitting: Internal Medicine

## 2012-02-18 ENCOUNTER — Ambulatory Visit (HOSPITAL_COMMUNITY)
Admission: RE | Admit: 2012-02-18 | Discharge: 2012-02-18 | Disposition: A | Discharge status: Home / Self Care | Payer: Medicare Other | Source: Ambulatory Visit | Attending: Internal Medicine | Admitting: Internal Medicine

## 2012-02-18 DIAGNOSIS — M899 Disorder of bone, unspecified: Secondary | ICD-10-CM | POA: Insufficient documentation

## 2012-02-18 DIAGNOSIS — M25559 Pain in unspecified hip: Secondary | ICD-10-CM | POA: Insufficient documentation

## 2012-02-18 DIAGNOSIS — D259 Leiomyoma of uterus, unspecified: Secondary | ICD-10-CM | POA: Insufficient documentation

## 2012-02-18 DIAGNOSIS — N3289 Other specified disorders of bladder: Secondary | ICD-10-CM | POA: Insufficient documentation

## 2012-02-18 DIAGNOSIS — M25552 Pain in left hip: Secondary | ICD-10-CM

## 2012-02-18 DIAGNOSIS — E876 Hypokalemia: Secondary | ICD-10-CM

## 2012-02-18 LAB — BASIC METABOLIC PANEL
Calcium: 9.3 mg/dL (ref 8.4–10.5)
Creatinine, Ser: 1.07 mg/dL (ref 0.50–1.10)
GFR calc non Af Amer: 52 mL/min — ABNORMAL LOW (ref >90)
Sodium: 143 mEq/L (ref 135–145)

## 2012-02-18 MED ORDER — POTASSIUM CHLORIDE ER 10 MEQ PO TBCR: 40.0000 meq | EXTENDED_RELEASE_TABLET | Freq: Two times a day (BID) | ORAL | Status: DC

## 2012-02-18 MED ORDER — GADOBENATE DIMEGLUMINE 529 MG/ML IV SOLN
17.0000 mL | Freq: Once | INTRAVENOUS | Status: AC
  Administered 2012-02-18: 17 mL via INTRAVENOUS

## 2012-02-19 ENCOUNTER — Other Ambulatory Visit: Payer: Self-pay | Admitting: Internal Medicine

## 2012-02-19 DIAGNOSIS — E876 Hypokalemia: Secondary | ICD-10-CM

## 2012-02-19 MED ORDER — POTASSIUM CHLORIDE ER 10 MEQ PO TBCR: 40.0000 meq | EXTENDED_RELEASE_TABLET | Freq: Once | ORAL | Status: DC

## 2012-02-19 NOTE — Progress Notes (Signed)
Kcl 40 meq called into local pharmacy.  Pt informed. Order to Mail order cancelled

## 2012-03-20 ENCOUNTER — Other Ambulatory Visit: Payer: Self-pay | Admitting: Internal Medicine

## 2012-03-20 DIAGNOSIS — E785 Hyperlipidemia, unspecified: Secondary | ICD-10-CM

## 2012-04-12 ENCOUNTER — Other Ambulatory Visit: Payer: Self-pay | Admitting: Internal Medicine

## 2012-04-19 ENCOUNTER — Ambulatory Visit (INDEPENDENT_AMBULATORY_CARE_PROVIDER_SITE_OTHER): Payer: Medicare Other | Admitting: Internal Medicine

## 2012-04-19 ENCOUNTER — Encounter: Payer: Self-pay | Admitting: Internal Medicine

## 2012-04-19 VITALS — BP 134/82 | HR 75 | Temp 98.4°F | Ht 66.0 in | Wt 185.3 lb

## 2012-04-19 DIAGNOSIS — Z0000 Encounter for general adult medical examination without abnormal findings: Secondary | ICD-10-CM

## 2012-04-19 DIAGNOSIS — E782 Mixed hyperlipidemia: Secondary | ICD-10-CM

## 2012-04-19 DIAGNOSIS — Z23 Encounter for immunization: Secondary | ICD-10-CM

## 2012-04-19 DIAGNOSIS — I10 Essential (primary) hypertension: Secondary | ICD-10-CM

## 2012-04-19 DIAGNOSIS — E119 Type 2 diabetes mellitus without complications: Secondary | ICD-10-CM

## 2012-04-19 NOTE — Assessment & Plan Note (Signed)
Patient is taking simvastatin 20 mg daily. Her last LDL was 97 at 02/10/12. Patient's AST and ALT were normal at 09/30/11. She does not have side effects, such as muscle pain. We continue current regimen

## 2012-04-19 NOTE — Assessment & Plan Note (Signed)
She is not taking any medications for her diabetes. Her previous A1c was 6.7 at 02/10/12. Patient is currently doing exercise and walks at least 30 minutes each day. We continued to followup.

## 2012-04-19 NOTE — Patient Instructions (Signed)
1. You have done great job in taking all your medications. I appreciate it very much. Please continue doing that. 2. Please take all medications as prescribed.  3. If you have worsening of your symptoms or new symptoms arise, please call the clinic ([phone]), or go to the ER immediately if symptoms are severe.     

## 2012-04-19 NOTE — Assessment & Plan Note (Signed)
Blood pressure is well controlled. Her blood pressure is 134/82 today. We'll continue current regimen.

## 2012-04-19 NOTE — Progress Notes (Signed)
Patient ID: Gina Bailey, female   DOB: 13-Jan-1946, 66 y.o.   MRN: [id number]  Subjective:   [name]   DOB: Feb 15, 1946 66 y.o.   MRN: [id number]  HPI:  Patient is a 66 year old lady with a past medical history as outlined below, who presents for a follow up visit.  Patient reports that she has been taking all her medications regularly. Patient does not have any new complaints today. She reports that her left hip pain is getting better and does not brother her anymore. She is currently taking Tylenol, baclofen and occasionally she takes the tramadol for hip pain. She reports that she is a doing exercise and walks at least 30 minutes each day.    Denies fever, chills, fatigue, headaches, cough, chest pain, SOB, abdominal pain,diarrhea, constipation, dysuria, urgency, frequency.      Past Medical History  Diagnosis Date  . Hypertension   . Ventricular hypertrophy 05/2006    LVH on ECG (05/2006) - Likely 2/2 HTN.  Marland Kitchen Hypokalemia     recurrent  . Seasonal allergies   . Ventral hernia     Incisional ventral hernia. S/P repair by Dr. Hulen Skains  (08/2008)  . HLD (hyperlipidemia)   . Villous adenoma of colon     Hx of. S/P resection and partial colectomy with anastomosis by Dr. Hulen Skains. (08/2006)  . Diabetes mellitus     Diet-controlled. Not on antiglycemic medications.   Current Outpatient Prescriptions  Medication Sig Dispense Refill  . acetaminophen (TYLENOL) 325 MG tablet Take 2 tablets (650 mg total) by mouth 3 (three) times daily as needed for pain.  100 tablet  2  . amLODipine (NORVASC) 10 MG tablet TAKE 1 TABLET DAILY  30 tablet  4  . aspirin 81 MG EC tablet Take 81 mg by mouth daily.        . baclofen (LIORESAL) 10 MG tablet Take 1 tablet (10 mg total) by mouth 2 (two) times daily.  60 tablet  3  . bifidobacterium infantis (ALIGN) capsule Take 1 capsule by mouth daily.        . fexofenadine (ALLEGRA) 180 MG tablet Take 180 mg by mouth daily.        Marland Kitchen glucose  blood (ACCU-CHEK SMARTVIEW) test strip Use as instructed  100 each  12  . hydrochlorothiazide (HYDRODIURIL) 25 MG tablet TAKE 1 TABLET DAILY  30 tablet  4  . Lancets 30G MISC 1 each by Other route 3 (three) times daily. Use to check blood sugar as needed  100 each  6  . lisinopril (PRINIVIL,ZESTRIL) 40 MG tablet TAKE 1 TABLET DAILY  30 tablet  4  . simvastatin (ZOCOR) 20 MG tablet TAKE 1 TABLET EVERY EVENING  90 tablet  3  . traMADol (ULTRAM) 50 MG tablet Take 1 tablet (50 mg total) by mouth every 6 (six) hours as needed for pain.  30 tablet  0  . Blood Glucose Monitoring Suppl (ACCU-CHEK NANO SMARTVIEW) W/DEVICE KIT by Does not apply route 1 day or 1 dose. Uses to check blood sugar once daily in the morning.       . potassium chloride (K-DUR) 10 MEQ tablet Take 4 tablets (40 mEq total) by mouth once.  4 tablet  0   Family History  Problem Relation Age of Onset  . Diabetes      1st degree reative in 3 of her siblings   History   Social History  . Marital Status: Widowed  Spouse Name: N/A    Number of Children: N/A  . Years of Education: N/A   Occupational History  . [employer]  . Merchant [employer] officer   Social History Main Topics  . Smoking status: Former Smoker -- 0.0 packs/day for 6 years    Types: Cigarettes    Quit date: 09/05/1999  . Smokeless tobacco: Never Used  . Alcohol Use: No  . Drug Use: No  . Sexually Active: Not Currently   Other Topics Concern  . None   Social History Narrative   Retired from working as a [employer].Widowed. (since 1997). Not sexually active since.Volunteers at [employer].   Review of Systems:  General: no fevers, chills, no changes in body weight, no changes in appetite Skin: no rash HEENT: no blurry vision, hearing changes or sore throat Pulm: no dyspnea, coughing, wheezing CV: no chest pain, palpitations, shortness of breath Abd: no nausea/vomiting, abdominal pain,  diarrhea/constipation GU: no dysuria, hematuria, polyuria Ext: mild hip pain. Neuro: no weakness, numbness, or tingling  Objective:  Physical Exam: Filed Vitals:   04/19/12 1324  BP: 134/82  Pulse: 75  Temp: 98.4 F (36.9 C)  TempSrc: Oral  Height: 5\' 6"  (1.676 m)  Weight: 185 lb 4.8 oz (84.052 kg)  SpO2: 97%   General: resting in bed, not in acute distress   HEENT: PERRL, EOMI, no scleral icterus   Cardiac: S1/S2, RRR, No murmurs, gallops or rubs   Pulm: Good air movement bilaterally, Clear to auscultation bilaterally, No rales, wheezing, rhonchi or rubs.   Abd: Soft, nondistended, nontender, no rebound pain, no organomegaly, BS present   Ext: No rashes or edema, 2+DP/PT pulse bilaterally. No tender over left hip and lateral upper thigh, there is no motion limitation to all directions. There is no local swelling or redness.   Neuro: alert and oriented X3, cranial nerves II-XII grossly intact, muscle strength 5/5 in all extremeties, sensation to light touch intact.  .   Assessment & Plan:

## 2012-04-19 NOTE — Assessment & Plan Note (Signed)
-  Will give her flu shot today -Will give her prescription for Zostavax today.

## 2012-04-22 ENCOUNTER — Other Ambulatory Visit: Payer: Self-pay | Admitting: Internal Medicine

## 2012-06-06 ENCOUNTER — Other Ambulatory Visit: Payer: Self-pay | Admitting: Family Medicine

## 2012-06-06 DIAGNOSIS — Z1231 Encounter for screening mammogram for malignant neoplasm of breast: Secondary | ICD-10-CM

## 2012-06-21 ENCOUNTER — Other Ambulatory Visit: Payer: Self-pay | Admitting: Internal Medicine

## 2012-07-21 ENCOUNTER — Ambulatory Visit
Admission: RE | Admit: 2012-07-21 | Discharge: 2012-07-21 | Disposition: A | Discharge status: Home / Self Care | Payer: Medicare PPO | Source: Ambulatory Visit | Attending: Family Medicine | Admitting: Family Medicine

## 2012-07-21 DIAGNOSIS — Z1231 Encounter for screening mammogram for malignant neoplasm of breast: Secondary | ICD-10-CM

## 2012-07-26 ENCOUNTER — Ambulatory Visit (INDEPENDENT_AMBULATORY_CARE_PROVIDER_SITE_OTHER): Payer: Medicare PPO | Admitting: Internal Medicine

## 2012-07-26 ENCOUNTER — Encounter: Payer: Self-pay | Admitting: Internal Medicine

## 2012-07-26 VITALS — BP 126/82 | HR 90 | Temp 98.8°F | Ht 66.0 in | Wt 183.4 lb

## 2012-07-26 DIAGNOSIS — Z0000 Encounter for general adult medical examination without abnormal findings: Secondary | ICD-10-CM

## 2012-07-26 DIAGNOSIS — Z79899 Other long term (current) drug therapy: Secondary | ICD-10-CM

## 2012-07-26 DIAGNOSIS — I10 Essential (primary) hypertension: Secondary | ICD-10-CM

## 2012-07-26 DIAGNOSIS — E119 Type 2 diabetes mellitus without complications: Secondary | ICD-10-CM

## 2012-07-26 DIAGNOSIS — E782 Mixed hyperlipidemia: Secondary | ICD-10-CM

## 2012-07-26 DIAGNOSIS — E785 Hyperlipidemia, unspecified: Secondary | ICD-10-CM

## 2012-07-26 NOTE — Patient Instructions (Signed)
1. You have done great job in taking all your medications. I appreciate it very much. Please continue doing that. 2. your diabetes is well controlled. From now you do not need to measure your blood sugar every day, instead you can measure your blood sugar every other days, or 2 times a week. 3. If you have worsening of your symptoms or new symptoms arise, please call the clinic [phone]), or go to the ER immediately if symptoms are severe.

## 2012-07-26 NOTE — Assessment & Plan Note (Signed)
Patient was given prescription for Zotavax in previous visit. Patient has not received his vaccination it. She reports that [facility] does not provide his vaccination. She would like to go to another pharmacy to get this  vaccination shot soon.

## 2012-07-26 NOTE — Progress Notes (Signed)
Patient ID: Gina Bailey, female   DOB: 1945/08/24, 67 y.o.   MRN: [id number]  Subjective:   [name]   DOB: Jun 17, 1946 67 y.o.   MRN: [id number]  HPI:  Patient is a 67 year old lady with a past medical history as outlined below, who presents for a follow up visit.  Patient reports that she has been taking all her medications regularly. Patient does not have any new complaints today. She pays attention to her diet and eats healthy diet all the times. She sits in regular exercise. She walks 30-60 minutes every day. Her body weight has been stable. Regarding her diabetes, she does not have symptoms for hypoglycemia. Currently her diabetes type 2 controlled, not on any medications. Her A1c is 6.8 today.  Denies fever, chills, fatigue, headaches, cough, chest pain, SOB, abdominal pain,diarrhea, constipation, dysuria, urgency, frequency.      Past Medical History  Diagnosis Date  . Hypertension   . Ventricular hypertrophy 05/2006    LVH on ECG (05/2006) - Likely 2/2 HTN.  Marland Kitchen Hypokalemia     recurrent  . Seasonal allergies   . Ventral hernia     Incisional ventral hernia. S/P repair by Dr. Hulen Skains  (08/2008)  . HLD (hyperlipidemia)   . Villous adenoma of colon     Hx of. S/P resection and partial colectomy with anastomosis by Dr. Hulen Skains. (08/2006)  . Diabetes mellitus     Diet-controlled. Not on antiglycemic medications.   Current Outpatient Prescriptions  Medication Sig Dispense Refill  . acetaminophen (TYLENOL) 325 MG tablet Take 2 tablets (650 mg total) by mouth 3 (three) times daily as needed for pain.  100 tablet  2  . amLODipine (NORVASC) 10 MG tablet TAKE 1 TABLET DAILY  30 tablet  4  . aspirin 81 MG EC tablet Take 81 mg by mouth daily.        . baclofen (LIORESAL) 10 MG tablet Take 1 tablet (10 mg total) by mouth 2 (two) times daily.  60 tablet  3  . bifidobacterium infantis (ALIGN) capsule Take 1 capsule by mouth daily.        . Blood Glucose Monitoring  Suppl (ACCU-CHEK NANO SMARTVIEW) W/DEVICE KIT by Does not apply route 1 day or 1 dose. Uses to check blood sugar once daily in the morning.       . fexofenadine (ALLEGRA) 180 MG tablet Take 180 mg by mouth daily.        . hydrochlorothiazide (HYDRODIURIL) 25 MG tablet TAKE 1 TABLET DAILY  30 tablet  4  . Lancets 30G MISC 1 each by Other route 3 (three) times daily. Use to check blood sugar as needed  100 each  6  . lisinopril (PRINIVIL,ZESTRIL) 40 MG tablet TAKE 1 TABLET DAILY  30 tablet  4  . simvastatin (ZOCOR) 20 MG tablet TAKE 1 TABLET EVERY EVENING  90 tablet  3  . traMADol (ULTRAM) 50 MG tablet Take 1 tablet (50 mg total) by mouth every 6 (six) hours as needed for pain.  30 tablet  0  . potassium chloride (K-DUR) 10 MEQ tablet Take 4 tablets (40 mEq total) by mouth once.  4 tablet  0   Family History  Problem Relation Age of Onset  . Diabetes      1st degree reative in 3 of her siblings   History   Social History  . Marital Status: Widowed    Spouse Name: N/A    Number of Children: N/A  .  Years of Education: N/A   Occupational History  . [place]  . Merchant [employer] officer   Social History Main Topics  . Smoking status: Former Smoker -- 0.0 packs/day for 6 years    Types: Cigarettes    Quit date: 09/05/1999  . Smokeless tobacco: Never Used  . Alcohol Use: No  . Drug Use: No  . Sexually Active: Not Currently   Other Topics Concern  . None   Social History Narrative   Retired from working as a [employer].Widowed. (since 1997). Not sexually active since.Volunteers at [employer].   Review of Systems: General: no fevers, chills, no changes in body weight, no changes in appetite Skin: no rash HEENT: no blurry vision, hearing changes or sore throat Pulm: no dyspnea, coughing, wheezing CV: no chest pain, palpitations, shortness of breath Abd: no nausea/vomiting, abdominal pain, diarrhea/constipation GU: no dysuria,  hematuria, polyuria Ext: mild hip pain. Neuro: no weakness, numbness, or tingling  Objective:  Physical Exam: Filed Vitals:   07/26/12 1405  BP: 126/82  Pulse: 90  Temp: 98.8 F (37.1 C)  TempSrc: Oral  Height: 5\' 6"  (1.676 m)  Weight: 183 lb 6.4 oz (83.19 kg)  SpO2: 99%   General: resting in bed, not in acute distress   HEENT: PERRL, EOMI, no scleral icterus   Cardiac: S1/S2, RRR, No murmurs, gallops or rubs   Pulm: Good air movement bilaterally, Clear to auscultation bilaterally, No rales, wheezing, rhonchi or rubs.   Abd: Soft, nondistended, nontender, no rebound pain, no organomegaly, BS present   Ext: No rashes or edema, 2+DP/PT pulse bilaterally. No tender over left hip and lateral upper thigh, there is no motion limitation to all directions. There is no local swelling or redness.   Neuro: alert and oriented X3, cranial nerves II-XII grossly intact, muscle strength 5/5 in all extremeties, sensation to light touch intact.  .   Assessment & Plan:

## 2012-07-26 NOTE — Assessment & Plan Note (Addendum)
  Lab Results  Component Value Date   HGBA1C 6.8 07/26/2012   HGBA1C 6.7 02/10/2012   HGBA1C 6.8 10/06/2011     Assessment:  Diabetes control: good control (HgbA1C at goal)  Progress toward A1C goal:  at goal  Comments:  Plan:  Medications:  Diet controlled.  Home glucose monitoring:   Frequency: once a day   Timing:   each AM  Instruction/counseling given: discussed diet  Educational resources provided: brochure  Self management tools provided:    Other plans:  Her diabetes is a diet controlled. Patient is a healthy lifestyle. She is doing regular exercise every day. Her body weight has been stable. She is currently measure her blood sugar every day. Her CBG is 100% at target ranges. Will let patient decrease the frequency for measurement of her CBG, proximately every other days, or 2-3 times a week from now.

## 2012-07-26 NOTE — Assessment & Plan Note (Signed)
Her LDL was 97 on 02/10/12. Currently patient is taking Zocor 20 mg daily. She did not noticed any side effects, such as muscle pain. Her liver function was normal at 09/30/10. We'll continue current regimen.

## 2012-07-26 NOTE — Assessment & Plan Note (Signed)
BP Readings from Last 3 Encounters:  07/26/12 126/82  04/19/12 134/82  02/10/12 134/89    Lab Results  Component Value Date   NA 143 02/18/2012   K 3.2* 02/18/2012   CREATININE 1.07 02/18/2012    Assessment:  Blood pressure control: controlled  Progress toward BP goal:  at goal  Comments:   Plan:  Medications:  continue current medications  Educational resources provided: brochure  Self management tools provided:    Other plans: Her blood pressure is well controlled. We'll continue current regimen (amlodipine 10 mg daily, hydrochlorothiazide 25 mg daily and lisinopril 40 mg daily).

## 2012-10-25 ENCOUNTER — Encounter: Payer: Self-pay | Admitting: Internal Medicine

## 2012-10-25 ENCOUNTER — Ambulatory Visit (INDEPENDENT_AMBULATORY_CARE_PROVIDER_SITE_OTHER): Payer: Medicare PPO | Admitting: Internal Medicine

## 2012-10-25 ENCOUNTER — Ambulatory Visit (INDEPENDENT_AMBULATORY_CARE_PROVIDER_SITE_OTHER): Payer: Medicare PPO | Admitting: Dietician

## 2012-10-25 VITALS — BP 138/91 | HR 90 | Temp 99.5°F | Ht 66.0 in | Wt 192.1 lb

## 2012-10-25 DIAGNOSIS — E119 Type 2 diabetes mellitus without complications: Secondary | ICD-10-CM

## 2012-10-25 DIAGNOSIS — I10 Essential (primary) hypertension: Secondary | ICD-10-CM

## 2012-10-25 DIAGNOSIS — E785 Hyperlipidemia, unspecified: Secondary | ICD-10-CM

## 2012-10-25 DIAGNOSIS — E782 Mixed hyperlipidemia: Secondary | ICD-10-CM

## 2012-10-25 MED ORDER — SIMVASTATIN 20 MG PO TABS: 20.0000 mg | ORAL_TABLET | Freq: Every day | ORAL | Status: DC

## 2012-10-25 MED ORDER — LISINOPRIL 40 MG PO TABS: 40.0000 mg | ORAL_TABLET | Freq: Every day | ORAL | Status: DC

## 2012-10-25 MED ORDER — AMLODIPINE BESYLATE 10 MG PO TABS: 10.0000 mg | ORAL_TABLET | Freq: Every day | ORAL | Status: DC

## 2012-10-25 MED ORDER — HYDROCHLOROTHIAZIDE 25 MG PO TABS: 25.0000 mg | ORAL_TABLET | Freq: Every day | ORAL | Status: DC

## 2012-10-25 MED ORDER — ASPIRIN EC 81 MG PO TBEC: 81.0000 mg | DELAYED_RELEASE_TABLET | Freq: Every day | ORAL | Status: DC

## 2012-10-25 MED ORDER — ACCU-CHEK NANO SMARTVIEW W/DEVICE KIT: 1.0000 | PACK | Status: DC

## 2012-10-25 NOTE — Progress Notes (Signed)
Patient ID: MYAMI LUGARDO, female   DOB: 1946/06/07, 67 y.o.   MRN: [id number] Subjective:   Patient ID: LOURI JURGENSON female   DOB: 1946-02-16 67 y.o.   MRN: [id number]  CC:   Follow up visit for DM-II, HTN and HLD  HPI:  Ms.Latasha J Giliberto is a 67 y.o. lady with past medical history as outlined below, who presents for a followup visit today.  Patient reports that she has been taking all her medications regularly. Patient does not have any new complaints today.  1. HTN:  She is currently taking lisinopril 40 mg daily, hydrochlorothiazide 25 mg daily, amlodipine 10 mg daily and aspirin. She does not have chest pain, shortness of breath, leg edema or palpitation. Her blood pressure is 138/91 mmHg.  2 DM-II: Her diabetes was well controlled by diet. Her previous A1c was less than 7 in previous visit, but today A1c is 8.1. Her meter record shows 100% CBG are within target (100 to 180 mg/dl). She does not have symptoms of hyperglycemia, such as an increased urination, feeling thirsty all the time. She used to do regular exercise every day. She used to work 30-60 minutes every day. But recently she did not do regular exercises because of bad weather. Her body weight increased by nearly 10 pounds. I talked with patient about adding another oral medication, such as metformin to her regimen, she refused it. She would like to resume her exercise first. If she continues to have elevated blood sugar or A1c, she would like to consider oral medication.  3. HLD: Her LDL was 97 on 02/10/12. She is currently taking Zocor 20 mg daily. She did not have any side effects, such as muscle pain.  Denies fever, chills, fatigue, headaches, cough, chest pain, SOB, abdominal pain,diarrhea, constipation, dysuria, urgency, frequency.    Past Medical History  Diagnosis Date  . Hypertension   . Ventricular hypertrophy 05/2006    LVH on ECG (05/2006) - Likely 2/2 HTN.  Marland Kitchen Hypokalemia     recurrent  . Seasonal allergies   .  Ventral hernia     Incisional ventral hernia. S/P repair by Dr. Hulen Skains  (08/2008)  . HLD (hyperlipidemia)   . Villous adenoma of colon     Hx of. S/P resection and partial colectomy with anastomosis by Dr. Hulen Skains. (08/2006)  . Diabetes mellitus     Diet-controlled. Not on antiglycemic medications.   Current Outpatient Prescriptions  Medication Sig Dispense Refill  . amLODipine (NORVASC) 10 MG tablet Take 1 tablet (10 mg total) by mouth daily.  60 tablet  3  . aspirin 81 MG EC tablet Take 81 mg by mouth daily.        Marland Kitchen aspirin EC 81 MG tablet Take 1 tablet (81 mg total) by mouth daily.  150 tablet  3  . bifidobacterium infantis (ALIGN) capsule Take 1 capsule by mouth daily.        . Blood Glucose Monitoring Suppl (ACCU-CHEK NANO SMARTVIEW) W/DEVICE KIT 1 each by Does not apply route 1 day or 1 dose. Uses to check blood sugar once daily in the morning.  3 kit  3  . fexofenadine (ALLEGRA) 180 MG tablet Take 180 mg by mouth daily.        . hydrochlorothiazide (HYDRODIURIL) 25 MG tablet Take 1 tablet (25 mg total) by mouth daily.  60 tablet  3  . Lancets 30G MISC 1 each by Other route 3 (three) times daily. Use to check blood sugar as  needed  100 each  6  . lisinopril (PRINIVIL,ZESTRIL) 40 MG tablet Take 1 tablet (40 mg total) by mouth daily.  60 tablet  3  . potassium chloride (K-DUR) 10 MEQ tablet Take 4 tablets (40 mEq total) by mouth once.  4 tablet  0  . simvastatin (ZOCOR) 20 MG tablet Take 1 tablet (20 mg total) by mouth daily.  90 tablet  3  . traMADol (ULTRAM) 50 MG tablet Take 1 tablet (50 mg total) by mouth every 6 (six) hours as needed for pain.  30 tablet  0   No current facility-administered medications for this visit.   Family History  Problem Relation Age of Onset  . Diabetes      1st degree reative in 3 of her siblings   History   Social History  . Marital Status: Widowed    Spouse Name: N/A    Number of Children: N/A  . Years of Education: N/A   Occupational History   . [employer]  . Merchant [employer] officer   Social History Main Topics  . Smoking status: Former Smoker -- 0.05 packs/day for 6 years    Types: Cigarettes    Quit date: 09/05/1999  . Smokeless tobacco: Never Used  . Alcohol Use: No  . Drug Use: No  . Sexually Active: Not Currently   Other Topics Concern  . Not on file   Social History Narrative   Retired from working as a [employer].   Widowed. (since 1997). Not sexually active since.   Volunteers at [employer].    Review of Systems: as per HPI  Objective:  Physical Exam: Filed Vitals:   10/25/12 1352  BP: 138/91  Pulse: 90  Temp: 99.5 F (37.5 C)  TempSrc: Oral  Height: 5\' 6"  (1.676 m)  Weight: 192 lb 1.6 oz (87.136 kg)  SpO2: 99%   General: resting in bed, not in acute distress   HEENT: PERRL, EOMI, no scleral icterus   Cardiac: S1/S2, RRR, No murmurs, gallops or rubs   Pulm: Good air movement bilaterally, Clear to auscultation bilaterally, No rales, wheezing, rhonchi or rubs.   Abd: Soft, nondistended, nontender, no rebound pain, no organomegaly, BS present   Ext: No rashes or edema, 2+DP/PT pulse bilaterally. No tender over left hip and lateral upper thigh, there is no motion limitation to all directions. There is no local swelling or redness.   Neuro: alert and oriented X3, cranial nerves II-XII grossly intact, muscle strength 5/5 in all extremeties, sensation to light touch intact.     Assessment & Plan:

## 2012-10-25 NOTE — Assessment & Plan Note (Signed)
BP Readings from Last 3 Encounters:  10/25/12 138/91  07/26/12 126/82  04/19/12 134/82    Lab Results  Component Value Date   NA 143 02/18/2012   K 3.2* 02/18/2012   CREATININE 1.07 02/18/2012    Assessment: Blood pressure control: controlled Progress toward BP goal:  at goal Comments:   Plan: Medications:  continue current medications Educational resources provided: brochure Self management tools provided:   Other plans: will continue current regimen

## 2012-10-25 NOTE — Progress Notes (Signed)
agree

## 2012-10-25 NOTE — Assessment & Plan Note (Signed)
Patient's diabetes use to be well controlled with A1c less than 7 in her previous visit . Today her A1c increased to 8.1, which she contributes to having stopped exercise recently due to bad weather. Patient would like to resume her exercise first before considering to add oral medication. We'll followup.

## 2012-10-25 NOTE — Patient Instructions (Addendum)
Plan to lower your A1C-   1-  check blood sugar in evening before dinner before bed - skip the before breakfast check on that day.   2 - Walking more in the mornings for at least 45 minutes for 4 days a week for the next 4 weeks  3- make a follow up and we'll review how the above plan is working...by checking your weight and looking at blood sugars.

## 2012-10-25 NOTE — Assessment & Plan Note (Signed)
LDL is at goal. Patient is currently taking Zocor 20 mg daily. She did not notice any side effects, such as muscle pain. We'll continue current regimen.  Lipid Panel     Component Value Date/Time   CHOL 168 02/10/2012 1033   TRIG 49 02/10/2012 1033   HDL 61 02/10/2012 1033   CHOLHDL 2.8 02/10/2012 1033   VLDL 10 02/10/2012 1033   LDLCALC 97 02/10/2012 1033

## 2012-10-25 NOTE — Progress Notes (Signed)
Medical Nutrition Therapy:  Appt start time: 1500 end time:  1510.  Assessment:  Primary concerns today: Weight management and: Blood sugar control.  Weight and a1C increased. Meter download with 100% in target.   Usual eating pattern without change: healthy and includes dairy, fruit, vegetables and nuts. 3 meals and 2-3 snacks per day.    Usual physical activity includes walking around house and ADLS- has been too busy to exercise including going to [employer] sne[employer].   Progress Towards Goal(s):     Nutritional Diagnosis:  NB-2.1 Physical inactivity As related to sedentary lifestyle/needing more activity for weight and blood sugar control and competing values As evidenced by report her report.   Interventions: 1- Nutrition education: Discussed current A1C and discrepancy in meter readings and how to change self monitoring to "investigate" cause  2- Goal setting- assisted with planning  to increase her physical activity.      Monitoring/Evaluation:  Blood sugars/ exercise history  in 1 month(s)

## 2012-10-25 NOTE — Patient Instructions (Addendum)
1. Please start doing regular exercise as you did before. If your blood sugar still elevated, we may need to add an oral medication in you next visit 2. Please take all medications as prescribed.  3. If you have worsening of your symptoms or new symptoms arise, please call the clinic [phone]), or go to the ER immediately if symptoms are severe.  You have done great job in taking all your medications. I appreciate it very much. Please continue doing that.

## 2012-10-26 ENCOUNTER — Other Ambulatory Visit: Payer: Self-pay | Admitting: Dietician

## 2012-10-26 DIAGNOSIS — E119 Type 2 diabetes mellitus without complications: Secondary | ICD-10-CM

## 2012-10-28 ENCOUNTER — Telehealth: Payer: Self-pay | Admitting: *Deleted

## 2012-10-28 DIAGNOSIS — E119 Type 2 diabetes mellitus without complications: Secondary | ICD-10-CM

## 2012-10-28 NOTE — Telephone Encounter (Signed)
Call from [facility] - needs clarification of direction for Accu-chek kit, test strips, and lancets.  Does pt check her blood sugars 1 time a day or 3 times daily?  Also include dx code w/new rxs.  Thanks

## 2012-10-31 ENCOUNTER — Other Ambulatory Visit: Payer: Self-pay | Admitting: Internal Medicine

## 2012-10-31 DIAGNOSIS — E119 Type 2 diabetes mellitus without complications: Secondary | ICD-10-CM

## 2012-10-31 MED ORDER — ACCU-CHEK NANO SMARTVIEW W/DEVICE KIT: 1.0000 | PACK | Status: DC

## 2012-10-31 NOTE — Telephone Encounter (Signed)
   Accu-Chek kit,  test strips and lancets, use for checking blood sugar, 1 time per day. Diagnosis Code is 250.00  [name], MD PGY2, Internal Medicine Teaching Service Pager: [phone]

## 2012-11-01 NOTE — Telephone Encounter (Signed)
Need new rxs sent to [facility].

## 2012-11-02 ENCOUNTER — Other Ambulatory Visit: Payer: Self-pay | Admitting: Internal Medicine

## 2012-11-02 NOTE — Telephone Encounter (Signed)
I already sent new prescription to her pharmacy on 10/31/12. Thanks.  [name], MD PGY2, Internal Medicine Teaching Service Pager: [phone]

## 2012-11-02 NOTE — Telephone Encounter (Signed)
Diabetic supplies cannot be call in to the pharmacy. Thanks

## 2012-11-03 MED ORDER — LANCETS 30G MISC: Status: DC

## 2012-11-03 MED ORDER — GLUCOSE BLOOD VI STRP: ORAL_STRIP | Status: DC

## 2012-11-03 NOTE — Telephone Encounter (Signed)
Pt request rxs sent to [facility] not [facility].

## 2013-01-24 ENCOUNTER — Encounter: Payer: Self-pay | Admitting: Dietician

## 2013-01-24 ENCOUNTER — Encounter: Payer: Self-pay | Admitting: Internal Medicine

## 2013-01-24 ENCOUNTER — Ambulatory Visit (INDEPENDENT_AMBULATORY_CARE_PROVIDER_SITE_OTHER): Payer: Medicare PPO | Admitting: Dietician

## 2013-01-24 ENCOUNTER — Telehealth: Payer: Self-pay | Admitting: Internal Medicine

## 2013-01-24 ENCOUNTER — Ambulatory Visit (INDEPENDENT_AMBULATORY_CARE_PROVIDER_SITE_OTHER): Payer: Medicare PPO | Admitting: Internal Medicine

## 2013-01-24 VITALS — BP 157/96 | HR 85 | Temp 98.4°F | Ht 66.0 in | Wt 192.0 lb

## 2013-01-24 DIAGNOSIS — R600 Localized edema: Secondary | ICD-10-CM

## 2013-01-24 DIAGNOSIS — S91109A Unspecified open wound of unspecified toe(s) without damage to nail, initial encounter: Secondary | ICD-10-CM

## 2013-01-24 DIAGNOSIS — R609 Edema, unspecified: Secondary | ICD-10-CM

## 2013-01-24 DIAGNOSIS — E119 Type 2 diabetes mellitus without complications: Secondary | ICD-10-CM

## 2013-01-24 DIAGNOSIS — S90221A Contusion of right lesser toe(s) with damage to nail, initial encounter: Secondary | ICD-10-CM

## 2013-01-24 DIAGNOSIS — S91219A Laceration without foreign body of unspecified toe(s) with damage to nail, initial encounter: Secondary | ICD-10-CM

## 2013-01-24 DIAGNOSIS — S90129A Contusion of unspecified lesser toe(s) without damage to nail, initial encounter: Secondary | ICD-10-CM

## 2013-01-24 DIAGNOSIS — I10 Essential (primary) hypertension: Secondary | ICD-10-CM

## 2013-01-24 LAB — COMPLETE METABOLIC PANEL WITH GFR
ALT: 17 U/L (ref 0–35)
AST: 18 U/L (ref 0–37)
Albumin: 4.3 g/dL (ref 3.5–5.2)
CO2: 33 mEq/L — ABNORMAL HIGH (ref 19–32)
Calcium: 10.2 mg/dL (ref 8.4–10.5)
Chloride: 101 mEq/L (ref 96–112)
GFR, Est African American: 65 mL/min
GFR, Est Non African American: 56 mL/min — ABNORMAL LOW
Potassium: 3.1 mEq/L — ABNORMAL LOW (ref 3.5–5.3)
Total Protein: 9 g/dL — ABNORMAL HIGH (ref 6.0–8.3)

## 2013-01-24 LAB — PRO B NATRIURETIC PEPTIDE: Pro B Natriuretic peptide (BNP): 37.53 pg/mL (ref <126)

## 2013-01-24 LAB — GLUCOSE, CAPILLARY: Glucose-Capillary: 100 mg/dL — ABNORMAL HIGH (ref 70–99)

## 2013-01-24 MED ORDER — POTASSIUM CHLORIDE ER 20 MEQ PO TBCR: 20.0000 meq | EXTENDED_RELEASE_TABLET | Freq: Two times a day (BID) | ORAL | Status: DC

## 2013-01-24 MED ORDER — FUROSEMIDE 40 MG PO TABS: 40.0000 mg | ORAL_TABLET | Freq: Every day | ORAL | Status: DC

## 2013-01-24 NOTE — Patient Instructions (Signed)
1. Please stop taking hydrochlorothiazide (HCTZ), and start taking Lasix 40 mg daily. Please also starting taking potassium chloride pill, 20 mEq daily from now.  2. Please come back in next week for re-evalulation.  3. If you have worsening of your symptoms or new symptoms arise, please call the clinic [phone]), or go to the ER immediately if symptoms are severe.

## 2013-01-24 NOTE — Assessment & Plan Note (Signed)
Etiology for leg edema is not clear currently. But the patient had normal creatinine on 02/18/12 and normal liver function on 09/30/10, indicating less likely due to renal or liver etiology. Patient has a long-standing hypertension which has not been well controlled recently, making CHF likely the diagnosis. Patient did not have 2-D echo recorded in the Epic in the past.  -Will check pro-BNP and 2D echo -Will check TSH -Will repeat CMP and Micro/albumin ratio -will switch HCTZ to low dose lasix 40 mg daily.  - will add KCl 20 mEq daily. -will follow up in one week.

## 2013-01-24 NOTE — Assessment & Plan Note (Signed)
Lab Results  Component Value Date   HGBA1C 7.5 01/24/2013   HGBA1C 8.1 10/25/2012   HGBA1C 6.8 07/26/2012     Assessment: Diabetes control: fair control Progress toward A1C goal:    Comments:   Plan: Medications:  continue diet control Home glucose monitoring: Frequency:   Timing:   Instruction/counseling given: reminded to bring blood glucose meter & log to each visit Educational resources provided: brochure Self management tools provided:   Other plans: A1C improved from 8.1 to 7.5.  Will continue diet control and follow up

## 2013-01-24 NOTE — Progress Notes (Addendum)
Patient ID: Gina Bailey, female   DOB: Jan 10, 1946, 67 y.o.   MRN: [id number] Subjective:   [name]   DOB: 1946/02/28 67 y.o.   MRN: [id number]  CC:   3 month follow up visit.   HPI:  Gina Bailey is a 67 y.o.    with past medical history as outlined below, who presents for a followup visit today  1. HTN:  She is currently taking lisinopril 40 mg daily, hydrochlorothiazide 25 mg daily, amlodipine 10 mg daily and aspirin. She does not have chest pain, shortness of breath, or palpitation, but has leg edema. Her blood pressure is 157/96 mmHg.   2 DM-II: Her diabetes was well controlled by diet. Her previous A1c was less than 7 in the past, but A1c was 8.1 on previous visit (10/25/12). Her meter record showed 100% CBG are within target (100 to 180 mg/dl). She attributed her worsening of diabetes control to stopping exercise. She used to do regular exercise every day. She used to work 30-60 minutes every day. But she did not do regular exercises because of bad weather. Her body weight increased by nearly 10 pounds. I talked with patient about adding another oral medication, such as metformin to her regimen, she refused it. She would like to resume her exercise first. We decided that If she continues to have elevated blood sugar or A1c, would consider to add an oral medication.Today her A1c is better 8/1-->7.5. She does have symptoms of hyperglycemia, such as an increased urination, feeling thirsty all the time.   3. HLD: Her LDL was 97 on 02/10/12. She is currently taking Zocor 20 mg daily. She did not have any side effects, such as muscle pain.  4. Leg edema: Patient noticed bilateral leg edema for more than 2 weeks. Right is worse than the left. She does not have pain in the calf areas. She does not have any chest pain or shortness of breath. She does not have tachycardia.  ROS: Dnies fever, chills, fatigue, headaches, cough, chest pain, SOB, abdominal pain,diarrhea,  constipation, dysuria, urgency, frequency.      Past Medical History  Diagnosis Date  . Hypertension   . Ventricular hypertrophy 05/2006    LVH on ECG (05/2006) - Likely 2/2 HTN.  Marland Kitchen Hypokalemia     recurrent  . Seasonal allergies   . Ventral hernia     Incisional ventral hernia. S/P repair by Dr. Hulen Skains  (08/2008)  . HLD (hyperlipidemia)   . Villous adenoma of colon     Hx of. S/P resection and partial colectomy with anastomosis by Dr. Hulen Skains. (08/2006)  . Diabetes mellitus     Diet-controlled. Not on antiglycemic medications.   Current Outpatient Prescriptions  Medication Sig Dispense Refill  . amLODipine (NORVASC) 10 MG tablet Take 1 tablet (10 mg total) by mouth daily.  60 tablet  3  . aspirin 81 MG EC tablet Take 81 mg by mouth daily.        Marland Kitchen aspirin EC 81 MG tablet Take 1 tablet (81 mg total) by mouth daily.  150 tablet  3  . bifidobacterium infantis (ALIGN) capsule Take 1 capsule by mouth daily.        . Blood Glucose Monitoring Suppl (ACCU-CHEK NANO SMARTVIEW) W/DEVICE KIT 1 each by Does not apply route 1 day or 1 dose. Uses to check blood sugar once daily in the morning.  3 kit  3  . glucose blood (ACCU-CHEK SMARTVIEW) test strip Use  to check blood sugars 1 time daily. Dx Code: 250.00.  100 each  12  . Lancets 30G MISC Use to check blood sugar 1 time daily. Dx code: 250.00.  100 each  6  . lisinopril (PRINIVIL,ZESTRIL) 40 MG tablet Take 1 tablet (40 mg total) by mouth daily.  60 tablet  3  . simvastatin (ZOCOR) 20 MG tablet Take 1 tablet (20 mg total) by mouth daily.  90 tablet  3  . traMADol (ULTRAM) 50 MG tablet Take 1 tablet (50 mg total) by mouth every 6 (six) hours as needed for pain.  30 tablet  0  . fexofenadine (ALLEGRA) 180 MG tablet Take 180 mg by mouth daily.        . furosemide (LASIX) 40 MG tablet Take 1 tablet (40 mg total) by mouth daily.  30 tablet  11  . potassium chloride 20 MEQ TBCR Take 20 mEq by mouth 2 (two) times daily.  30 tablet  3   No current  facility-administered medications for this visit.   Family History  Problem Relation Age of Onset  . Diabetes      1st degree reative in 3 of her siblings   History   Social History  . Marital Status: Widowed    Spouse Name: N/A    Number of Children: N/A  . Years of Education: N/A   Occupational History  . [employer]  . Merchant [employer] officer   Social History Main Topics  . Smoking status: Former Smoker -- 0.05 packs/day for 6 years    Types: Cigarettes    Quit date: 09/05/1999  . Smokeless tobacco: Never Used  . Alcohol Use: No  . Drug Use: No  . Sexually Active: Not Currently   Other Topics Concern  . None   Social History Narrative   Retired from working as a [employer].   Widowed. (since 1997). Not sexually active since.   Volunteers at [employer].    Review of Systems: Full 14-point review of systems otherwise negative. See HPI.   Objective:   Physical Exam: Filed Vitals:   01/24/13 1328  BP: 157/96  Pulse: 85  Temp: 98.4 F (36.9 C)  TempSrc: Oral  Height: 5\' 6"  (1.676 m)  Weight: 192 lb (87.091 kg)  SpO2: 99%   General: resting in bed, not in acute distress   HEENT: PERRL, EOMI, no scleral icterus   Cardiac: S1/S2, RRR, No murmurs, gallops or rubs   Pulm: Good air movement bilaterally, Clear to auscultation bilaterally, No rales, wheezing, rhonchi or rubs.   Abd: Soft, nondistended, nontender, no rebound pain, no organomegaly, BS present   Ext: Has 1+ pitting edema in the right lower leg and trace edema in the left lower leg which are new. 2+DP/PT pulse bilaterally. There is no local redness or tenderness. Neuro: alert and oriented X3, cranial nerves II-XII grossly intact, muscle strength 5/5 in all extremeties, sensation to light touch intact.      Assessment & Plan:   Addendum:  1. Patient's BMP showed that her K level was 3.1. I called patient and asked her to take 60 mEq of KCl on  01/25/13 and then go back to take 20 mEq of KCl daily from 01/26/13. She will be on lasix 40 mg daily. She verbally understood and agreed to do so.   2. Pro-BNP is normal which is not consistent with CHF, but her clinic presentation is  consistent with CHF. Will continue lasix 40 mg daily and pending 2D echo.  [name], MD  PGY2, Internal Medicine Teaching Service  Pager: [phone]

## 2013-01-24 NOTE — Assessment & Plan Note (Signed)
BP Readings from Last 3 Encounters:  01/24/13 157/96  10/25/12 138/91  07/26/12 126/82    Lab Results  Component Value Date   NA 145 01/24/2013   K 3.1* 01/24/2013   CREATININE 1.03 01/24/2013    Assessment: Blood pressure control: mildly elevated Progress toward BP goal:  deteriorated Comments:   Plan: Medications:  will switch HCTZ to lasix Educational resources provided: brochure Self management tools provided: home blood pressure logbook Other plans: will switch from HCTZ to lasix 40 mg daily which is also for possible CHF

## 2013-01-24 NOTE — Assessment & Plan Note (Signed)
There is mild toe nail separation from nail bed in the right second toe. No bleeding or signs of infection. Will give referral to podiatrist

## 2013-01-24 NOTE — Progress Notes (Signed)
Medical Nutrition Therapy:  Appt start time: 1430 end time:  [id number].  Assessment:  Primary concerns today: Blood sugar control.  Weight about the same, A1C decreased. Meter download with 100% in target.   Usual eating pattern without change: healthy and includes dairy, fruit, vegetables and nuts. 3 meals and 2-3 snacks per day.    Usual physical activity includes gardening and ADLS.   Progress Towards Goal(s):     Nutritional Diagnosis:  NB-2.1 Physical inactivity As related to sedentary lifestyle/needing more activity for weight and blood sugar control and competing values As evidenced by report with some improvement and not at goal per patient.   Interventions: 1- Nutrition education: Discussed current A1C, meal plan and activity level  To assist patient with  Problem solving barriers to achieving more activity and her A1C goal pf <7%.  2- Goal setting- assisted with planning  to increase her physical activity.      Monitoring/Evaluation:  Blood sugars/ exercise history  in 1 month(s)

## 2013-01-24 NOTE — Telephone Encounter (Signed)
Patient's BMP shows that her K level is 3.1. I called patient and got to know that she will not be able to get her lasix and KCl pills until tomorrow morning. I asked her to take 60 mEq of KCl tomorrow (01/25/13) and then go back to take 20 mEq of KCl daily from 01/26/13. She will be on lasix 40 mg daily. She verbally understood and agreed to do so.   [name], MD PGY2, Internal Medicine Teaching Service Pager: [phone]

## 2013-01-24 NOTE — Patient Instructions (Addendum)
Follow up to monitor blood sugars and walking goal.

## 2013-01-25 NOTE — Progress Notes (Signed)
Case discussed with Dr. Blaine Hamper at the time of the visit.  We reviewed the resident's history and exam and pertinent patient test results.  I agree with the assessment, diagnosis, and plan of care documented in the resident's note.

## 2013-01-26 ENCOUNTER — Telehealth: Payer: Self-pay | Admitting: *Deleted

## 2013-01-26 ENCOUNTER — Other Ambulatory Visit: Payer: Self-pay

## 2013-01-26 NOTE — Telephone Encounter (Signed)
Rx for generic Lasix and potassium chloride were [facility] - insurance no longer uses. Both Rx were called in to [facility]. Shelah Heatley RN 7/10/11AM

## 2013-01-31 ENCOUNTER — Encounter: Payer: Self-pay | Admitting: Internal Medicine

## 2013-01-31 ENCOUNTER — Ambulatory Visit (INDEPENDENT_AMBULATORY_CARE_PROVIDER_SITE_OTHER): Payer: Medicare PPO | Admitting: Internal Medicine

## 2013-01-31 VITALS — BP 128/79 | HR 84 | Temp 98.6°F | Ht 66.0 in | Wt 191.6 lb

## 2013-01-31 DIAGNOSIS — R600 Localized edema: Secondary | ICD-10-CM

## 2013-01-31 DIAGNOSIS — I10 Essential (primary) hypertension: Secondary | ICD-10-CM

## 2013-01-31 DIAGNOSIS — R609 Edema, unspecified: Secondary | ICD-10-CM

## 2013-01-31 LAB — BASIC METABOLIC PANEL WITH GFR
CO2: 28 mEq/L (ref 19–32)
Calcium: 9.4 mg/dL (ref 8.4–10.5)
Creat: 1.08 mg/dL (ref 0.50–1.10)
GFR, Est African American: 61 mL/min
GFR, Est Non African American: 53 mL/min — ABNORMAL LOW

## 2013-01-31 MED ORDER — AMLODIPINE BESYLATE 5 MG PO TABS: 5.0000 mg | ORAL_TABLET | Freq: Every day | ORAL | Status: DC

## 2013-01-31 NOTE — Assessment & Plan Note (Addendum)
Etiology for her leg edema is still not clear. Patient has normal liver function and normal creatinine his GFR higher than 65, indicating unlikely due to the liver dysfunction and renal failure. Patient does not have symptoms of congestive heart failure and her proBNP is normal, making the CHF less likely than we initially thought. Another possibility is the side effect from amlodipine, which we did not think initially because that she has been on amlodipine for a long time without developing leg edema before. Anyway, after starting Lasix, he BW decreased by 1 pound. Her blood pressure is much better controlled. Her blood pressure is normal today. K=3.4 and Cre normal today.  Plan: - Continue Lasix 40 mg daily - Pending 2D echo - Decreased amlodipine from 10 mg to 5 mg daily. - Dollow up in 10 to 14 days. - will let pt to take an extra pill of KCl 20 mEq today.

## 2013-01-31 NOTE — Progress Notes (Signed)
Patient ID: Gina Bailey, female   DOB: 01/24/1946, 67 y.o.   MRN: [id number] Subjective:   [name]   DOB: [birth year] 67 y.o.   MRN: [id number]  CC:  1 week follow up visit due to leg edema.  HPI:  Gina Bailey is a 67 y.o. lady with past medical history as outlined below, who presents for 1 week follow up visit due to leg edema.  Patient was seen in clinic on 01/24/13 because of newly developed bilateral leg edema. Patient noticed bilateral leg edema 3 weeks ago. Right is worse than the left. She did not have any pain in the calf areas. She did not have any chest pain, shortness of breath, PND or orthopnea. Patient has been on 10 mg daily amlodipine for a long time and did not have leg edema before.  In addition, she was on  Hydrochlorothiazide 25 mg daily and lisinopril 40 mg daily for her hypertension. Her blood pressure was poorly controlled In previous visit, her blood pressure was 157/96 mmHg. Patient was suspected to have early stage of congestive heart failure. Her hydrochlorothiazide was switched to low dose of Lasix 40 mg daily. The liver function and TSH were checked which come back normal. ProBNP was 37.53. Today patient does not have any complaints. She still has leg edema which has not improved significantly. She reports that she has good urine output after starting Lasix. She does not have any chest pain, shortness of breath, pain in calf areas, palpitation, PND or orthopnea. 2-D echo was ordered, but was not done yet.  ROS: Dnies fever, chills, fatigue, headaches, cough, chest pain, SOB, abdominal pain,diarrhea, constipation, dysuria, urgency, frequency.    Past Medical History  Diagnosis Date  . Hypertension   . Ventricular hypertrophy 05/2006    LVH on ECG (05/2006) - Likely 2/2 HTN.  Marland Kitchen Hypokalemia     recurrent  . Seasonal allergies   . Ventral hernia     Incisional ventral hernia. S/P repair by Dr. Hulen Skains  (08/2008)  . HLD (hyperlipidemia)   .  Villous adenoma of colon     Hx of. S/P resection and partial colectomy with anastomosis by Dr. Hulen Skains. (08/2006)  . Diabetes mellitus     Diet-controlled. Not on antiglycemic medications.   Current Outpatient Prescriptions  Medication Sig Dispense Refill  . amLODipine (NORVASC) 10 MG tablet Take 1 tablet (10 mg total) by mouth daily.  60 tablet  3  . aspirin 81 MG EC tablet Take 81 mg by mouth daily.        Marland Kitchen aspirin EC 81 MG tablet Take 1 tablet (81 mg total) by mouth daily.  150 tablet  3  . bifidobacterium infantis (ALIGN) capsule Take 1 capsule by mouth daily.        . Blood Glucose Monitoring Suppl (ACCU-CHEK NANO SMARTVIEW) W/DEVICE KIT 1 each by Does not apply route 1 day or 1 dose. Uses to check blood sugar once daily in the morning.  3 kit  3  . fexofenadine (ALLEGRA) 180 MG tablet Take 180 mg by mouth daily.        . furosemide (LASIX) 40 MG tablet Take 1 tablet (40 mg total) by mouth daily.  30 tablet  11  . glucose blood (ACCU-CHEK SMARTVIEW) test strip Use to check blood sugars 1 time daily. Dx Code: 250.00.  100 each  12  . Lancets 30G MISC Use to check blood sugar 1 time daily. Dx code: 250.00.  100 each  6  . lisinopril (PRINIVIL,ZESTRIL) 40 MG tablet Take 1 tablet (40 mg total) by mouth daily.  60 tablet  3  . potassium chloride 20 MEQ TBCR Take 20 mEq by mouth 2 (two) times daily.  30 tablet  3  . simvastatin (ZOCOR) 20 MG tablet Take 1 tablet (20 mg total) by mouth daily.  90 tablet  3  . traMADol (ULTRAM) 50 MG tablet Take 1 tablet (50 mg total) by mouth every 6 (six) hours as needed for pain.  30 tablet  0   No current facility-administered medications for this visit.   Family History  Problem Relation Age of Onset  . Diabetes      1st degree reative in 3 of her siblings   History   Social History  . Marital Status: Widowed    Spouse Name: N/A    Number of Children: N/A  . Years of Education: N/A   Occupational History  . [employer]  . Merchant [employer] officer   Social History Main Topics  . Smoking status: Former Smoker -- 0.05 packs/day for 6 years    Types: Cigarettes    Quit date: 09/05/1999  . Smokeless tobacco: Never Used  . Alcohol Use: No  . Drug Use: No  . Sexually Active: Not Currently   Other Topics Concern  . None   Social History Narrative   Retired from working as a [employer].   Widowed. (since 1997). Not sexually active since.   Volunteers at [employer].    Review of Systems: Full 14-point review of systems otherwise negative. See HPI.   Objective:  Physical Exam: Filed Vitals:   01/31/13 1325  BP: 128/79  Pulse: 84  Temp: 98.6 F (37 C)  TempSrc: Oral  Height: 5\' 6"  (1.676 m)  Weight: 191 lb 9.6 oz (86.909 kg)  SpO2: 99%   General: resting in bed, not in acute distress   HEENT: PERRL, EOMI, no scleral icterus   Cardiac: S1/S2, RRR, No murmurs, gallops or rubs   Pulm: Good air movement bilaterally, Clear to auscultation bilaterally, No rales, wheezing, rhonchi or rubs.   Abd: Soft, nondistended, nontender, no rebound pain, no organomegaly, BS present   Ext: Has 1+ pitting edema in the right lower leg and trace edema in the left lower leg. 2+DP/PT pulse bilaterally. There is no local redness or tenderness. Neuro: alert and oriented X3, cranial nerves II-XII grossly intact, muscle strength 5/5 in all extremeties, sensation to light touch intact.    Assessment & Plan:

## 2013-01-31 NOTE — Progress Notes (Signed)
Case discussed with Dr. Blaine Hamper at the time of the visit.  We reviewed the resident's history and exam and pertinent patient test results.  I agree with the assessment, diagnosis, and plan of care documented in the resident's note.

## 2013-01-31 NOTE — Patient Instructions (Addendum)
1. Please decreased your amlodipine dose from 10 mg to 5 mg daily from now.  2. Please take an extra potassium pill  (20 mEQ of KCl) today. Then just take one potassium pill daily from tomorrow (20 mEq daily of KCl).  3. If you have worsening of your symptoms or new symptoms arise, please call the clinic [phone]), or go to the ER immediately if symptoms are severe.  You have done great job in taking all your medications. I appreciate it very much. Please continue doing that.

## 2013-02-01 NOTE — Progress Notes (Signed)
Case discussed with Dr. Blaine Hamper at the time of the visit.  We reviewed the resident's history and exam and pertinent patient test results.  I agree with the assessment, diagnosis, and plan of care documented in the resident's note.

## 2013-02-16 ENCOUNTER — Ambulatory Visit (INDEPENDENT_AMBULATORY_CARE_PROVIDER_SITE_OTHER): Payer: Medicare PPO | Admitting: Dietician

## 2013-02-16 ENCOUNTER — Encounter: Payer: Self-pay | Admitting: Dietician

## 2013-02-16 ENCOUNTER — Ambulatory Visit (HOSPITAL_COMMUNITY)
Admission: RE | Admit: 2013-02-16 | Discharge: 2013-02-16 | Disposition: A | Discharge status: Home / Self Care | Payer: Medicare PPO | Source: Ambulatory Visit | Attending: Internal Medicine | Admitting: Internal Medicine

## 2013-02-16 DIAGNOSIS — R609 Edema, unspecified: Secondary | ICD-10-CM | POA: Insufficient documentation

## 2013-02-16 DIAGNOSIS — R600 Localized edema: Secondary | ICD-10-CM

## 2013-02-16 DIAGNOSIS — I379 Nonrheumatic pulmonary valve disorder, unspecified: Secondary | ICD-10-CM | POA: Insufficient documentation

## 2013-02-16 DIAGNOSIS — E119 Type 2 diabetes mellitus without complications: Secondary | ICD-10-CM

## 2013-02-16 DIAGNOSIS — I079 Rheumatic tricuspid valve disease, unspecified: Secondary | ICD-10-CM | POA: Insufficient documentation

## 2013-02-16 NOTE — Progress Notes (Signed)
  Echocardiogram 2D Echocardiogram has been performed.  Pratik Dalziel 02/16/2013, 8:10 AM

## 2013-02-16 NOTE — Progress Notes (Signed)
Medical Nutrition Therapy:  Appt start time: 930 end time:  945.  Assessment:  Primary concerns today: Blood sugar control.  Weight increased 2 #. Meter download with 97% in target.   Usual eating pattern without change: healthy and includes dairy, fruit, vegetables and nuts. 3 meals and 2-3 snacks per day.    Trying to increase physical activity by gardening and increasing ADLS until toe surgery on both feet healed. Meter download shops average improveing over last 30 days, 7 day average 107, 14 day 117, 30 days 122  Progress Towards Goal(s):     Nutritional Diagnosis:  NB-2.1 Physical inactivity As related to sedentary lifestyle/needing more activity for weight and blood sugar control and competing values As evidenced by report with some improvement and not at goal per patient.   Interventions: 1- Nutrition education: Discussed current meter download, troubleshooting high blood sugars and plan to increase activity level after toe surgery is healed   her A1C goal is <7%.   2- Support provided to increase activity as able and continue same dietary pattern to achieve goal A1C < 7%.    Monitoring/Evaluation:  Blood sugars/ exercise history  in 1 month by phone, 2 months in office

## 2013-02-21 ENCOUNTER — Encounter: Payer: Self-pay | Admitting: Internal Medicine

## 2013-02-21 ENCOUNTER — Ambulatory Visit (INDEPENDENT_AMBULATORY_CARE_PROVIDER_SITE_OTHER): Payer: Medicare PPO | Admitting: Internal Medicine

## 2013-02-21 VITALS — BP 193/103 | HR 82 | Temp 98.0°F | Ht 64.0 in | Wt 192.6 lb

## 2013-02-21 DIAGNOSIS — R600 Localized edema: Secondary | ICD-10-CM

## 2013-02-21 DIAGNOSIS — I10 Essential (primary) hypertension: Secondary | ICD-10-CM

## 2013-02-21 DIAGNOSIS — R609 Edema, unspecified: Secondary | ICD-10-CM

## 2013-02-21 MED ORDER — HYDROCHLOROTHIAZIDE 12.5 MG PO CAPS: 25.0000 mg | ORAL_CAPSULE | Freq: Every day | ORAL | Status: DC

## 2013-02-21 MED ORDER — AMLODIPINE BESYLATE 5 MG PO TABS: 10.0000 mg | ORAL_TABLET | Freq: Every day | ORAL | Status: DC

## 2013-02-21 NOTE — Patient Instructions (Signed)
1. Please stop taking lasix from now. Please stop taking potassium pill now.   2. Pleases start taking hydrochlorothiazide (HCTZ) 25 mg daily, please increase your amlodipine dose from 5 mg to 10 mg daily.  3. If you have worsening of your symptoms or new symptoms arise, please call the clinic [phone]), or go to the ER immediately if symptoms are severe.  4. I will see you back in 3 to 4 weeks.

## 2013-02-21 NOTE — Progress Notes (Signed)
Case discussed with Dr. Niu soon after the resident saw the patient.  We reviewed the resident's history and exam and pertinent patient test results.  I agree with the assessment, diagnosis, and plan of care documented in the resident's note. 

## 2013-02-21 NOTE — Assessment & Plan Note (Signed)
Patient's leg edema is likely due to venous insufficiency. Her 2-D echo is basically normal ruling out congestive heart failure. We decreased amlodipine dose without any improvement on her leg edema. In addition, she has been taking amlodipine for years without leg edema before. It is less likely that edema is caused by her amlodipine side effects. Today her blood pressure is elevated at 193/103. Repeated measurement showed blood pressure is 160/95 mmHg. however her leg edema is asymmetric, patient does not have any tenderness, swelling over her calf areas, making the DVT unlikely the diagnosis.  -will stop Lasix and potassium pills -will restart her HCHZ 25 mg daily and Amlodipine 10 mg daily. - if leg edema persists, will get doppler to rule out DVT in next visit.

## 2013-02-21 NOTE — Progress Notes (Signed)
Patient ID: Gina Bailey, female   DOB: [birth year], 67 y.o.   MRN: [id number] Subjective:   [name]   DOB: 06/18/1946 67 y.o.   MRN: [id number]  CC:   #   month follow up visit.            Hospital followup visit.            Acute visit due to # HPI:  [name] is a 67 y.o.  Lady with past medical history as outlined below, who presents for a followup visit today.   Patient was seen in clinic on 01/24/13 and 01/31/13 because of newly developed bilateral leg edema. Patient noticed bilateral leg edema recently . Right is worse than the left. She did not have any pain in the calf areas. She did not have any chest pain, shortness of breath, PND or orthopnea. Patient has been on 10 mg daily amlodipine for a long time and did not have leg edema before. In addition, she was on Hydrochlorothiazide 25 mg daily and lisinopril 40 mg daily for her hypertension. Her blood pressure was poorly controlled In previous visit, her blood pressure was 157/96 mmHg. Patient was suspected to have early stage of congestive heart failure. Her hydrochlorothiazide was switched to low dose of Lasix 40 mg daily. The liver function and TSH were checked which come back normal. ProBNP was 37.53. Today patient does not have any complaints. She still has leg edema which has not improved. She reports that she has good urine output after starting Lasix. She does not have any chest pain, shortness of breath, pain in calf areas, palpitation, PND or orthopnea. 2-D echo on 02/16/13  was normal with EF 55 to 60%.   ROS: Dnies fever, chills, fatigue, headaches, cough, chest pain, SOB, abdominal pain,diarrhea, constipation, dysuria, urgency, frequency.     Past Medical History  Diagnosis Date  . Hypertension   . Ventricular hypertrophy 05/2006    LVH on ECG (05/2006) - Likely 2/2 HTN.  Marland Kitchen Hypokalemia     recurrent  . Seasonal allergies   . Ventral hernia     Incisional ventral hernia. S/P repair by Dr.  Hulen Skains  (08/2008)  . HLD (hyperlipidemia)   . Villous adenoma of colon     Hx of. S/P resection and partial colectomy with anastomosis by Dr. Hulen Skains. (08/2006)  . Diabetes mellitus     Diet-controlled. Not on antiglycemic medications.   Current Outpatient Prescriptions  Medication Sig Dispense Refill  . amLODipine (NORVASC) 5 MG tablet Take 1 tablet (5 mg total) by mouth daily.  60 tablet  3  . aspirin EC 81 MG tablet Take 1 tablet (81 mg total) by mouth daily.  150 tablet  3  . bifidobacterium infantis (ALIGN) capsule Take 1 capsule by mouth daily.        . Blood Glucose Monitoring Suppl (ACCU-CHEK NANO SMARTVIEW) W/DEVICE KIT 1 each by Does not apply route 1 day or 1 dose. Uses to check blood sugar once daily in the morning.  3 kit  3  . Dermatological Products, Misc. (GENADUR CO) Apply 1 Film topically every morning.      . fexofenadine (ALLEGRA) 180 MG tablet Take 180 mg by mouth daily.        . furosemide (LASIX) 40 MG tablet Take 1 tablet (40 mg total) by mouth daily.  30 tablet  11  . glucose blood (ACCU-CHEK SMARTVIEW) test strip Use to check blood sugars 1  time daily. Dx Code: 250.00.  100 each  12  . Lancets 30G MISC Use to check blood sugar 1 time daily. Dx code: 250.00.  100 each  6  . lisinopril (PRINIVIL,ZESTRIL) 40 MG tablet Take 1 tablet (40 mg total) by mouth daily.  60 tablet  3  . potassium chloride 20 MEQ TBCR Take 20 mEq by mouth 2 (two) times daily.  30 tablet  3  . simvastatin (ZOCOR) 20 MG tablet Take 1 tablet (20 mg total) by mouth daily.  90 tablet  3  . terbinafine (LAMISIL) 250 MG tablet Take 250 mg by mouth daily.       No current facility-administered medications for this visit.   Family History  Problem Relation Age of Onset  . Diabetes      1st degree reative in 3 of her siblings   History   Social History  . Marital Status: Widowed    Spouse Name: N/A    Number of Children: N/A  . Years of Education: N/A   Occupational History  . [place]  . Merchant [employer] officer   Social History Main Topics  . Smoking status: Former Smoker -- 0.05 packs/day for 6 years    Types: Cigarettes    Quit date: 09/05/1999  . Smokeless tobacco: Never Used  . Alcohol Use: No  . Drug Use: No  . Sexually Active: Not Currently   Other Topics Concern  . None   Social History Narrative   Retired from working as a [employer].   Widowed. (since 1997). Not sexually active since.   Volunteers at [employer].    Review of Systems: Full 14-point review of systems otherwise negative. See HPI.   Objective:  Physical Exam: Filed Vitals:   02/21/13 1317  BP: 193/103  Pulse: 82  Temp: 98 F (36.7 C)  TempSrc: Oral  Height: 5\' 4"  (1.626 m)  Weight: 192 lb 9.6 oz (87.363 kg)  SpO2: 97%   General: resting in bed, not in acute distress  HEENT: PERRL, EOMI, no scleral icterus  Cardiac: S1/S2, RRR, No murmurs, gallops or rubs  Pulm: Good air movement bilaterally, Clear to auscultation bilaterally, No rales, wheezing, rhonchi or rubs.  Abd: Soft, nondistended, nontender, no rebound pain, no organomegaly, BS present  Ext: Has 1+ pitting edema in the right lower leg and trace edema in the left lower leg. 2+DP/PT pulse bilaterally. There is no local redness or tenderness.  Neuro: alert and oriented X3, cranial nerves II-XII grossly intact, muscle strength 5/5 in all extremeties, sensation to light touch intact.    Assessment & Plan:

## 2013-03-14 ENCOUNTER — Ambulatory Visit (INDEPENDENT_AMBULATORY_CARE_PROVIDER_SITE_OTHER): Payer: Medicare PPO | Admitting: Internal Medicine

## 2013-03-14 ENCOUNTER — Encounter: Payer: Self-pay | Admitting: Internal Medicine

## 2013-03-14 VITALS — BP 145/88 | HR 88 | Temp 98.4°F | Ht 64.0 in | Wt 190.8 lb

## 2013-03-14 DIAGNOSIS — R609 Edema, unspecified: Secondary | ICD-10-CM

## 2013-03-14 DIAGNOSIS — E785 Hyperlipidemia, unspecified: Secondary | ICD-10-CM

## 2013-03-14 DIAGNOSIS — R600 Localized edema: Secondary | ICD-10-CM

## 2013-03-14 DIAGNOSIS — E119 Type 2 diabetes mellitus without complications: Secondary | ICD-10-CM

## 2013-03-14 LAB — GLUCOSE, CAPILLARY: Glucose-Capillary: 100 mg/dL — ABNORMAL HIGH (ref 70–99)

## 2013-03-14 NOTE — Patient Instructions (Addendum)
1. Please try to use compression stockings to your right leg and keep your right leg elevated above your heart level when you are at rest.   2. Please take all medications as prescribed.  3. If you have worsening of your symptoms or new symptoms arise, please call the clinic [phone] or go to the ER immediately if symptoms are severe.  You have done great job in taking all your medications. I appreciate it very much. Please continue doing that.  Venous Stasis and Chronic Venous Insufficiency As people age, the veins located in their legs may weaken and stretch. When veins weaken and lose the ability to pump blood effectively, the condition is called chronic venous insufficiency (CVI) or venous stasis. Almost all veins return blood back to the heart. This happens by:  The force of the heart pumping fresh blood pushes blood back to the heart.  Blood flowing to the heart from the force of gravity. In the deep veins of the legs, blood has to fight gravity and flow upstream back to the heart. Here, the leg muscles contract to pump blood back toward the heart. Vein walls are elastic, and many veins have small valves that only allow blood to flow in one direction. When leg muscles contract, they push inward against the elastic vein walls. This squeezes blood upward, opens the valves, and moves blood toward the heart. When leg muscles relax, the vein wall also relaxes and the valves inside the vein close to prevent blood from flowing backward. This method of pumping blood out of the legs is called the venous pump. CAUSES  The venous pump works best while walking and leg muscles are contracting. But when a person sits or stands, blood pressure in leg veins can build. Deep veins are usually able to withstand short periods of inactivity, but long periods of inactivity (and increased pressure) can stretch, weaken, and damage vein walls. High blood pressure can also stretch and damage vein walls. The veins may  no longer be able to pump blood back to the heart. Venous hypertension (high blood pressure inside veins) that lasts over time is a primary cause of CVI. CVI can also be caused by:   Deep vein thrombosis, a condition where a thrombus (blood clot) blocks blood flow in a vein.  Phlebitis, an inflammation of a superficial vein that causes a blood clot to form. Other risk factors for CVI may include:   Heredity.  Obesity.  Pregnancy.  Sedentary lifestyle.  Smoking.  Jobs requiring long periods of standing or sitting in one place.  Age and gender:  Women in their 33's and 37's and men in their 23's are more prone to developing CVI. SYMPTOMS  Symptoms of CVI may include:   Varicose veins.  Ulceration or skin breakdown.  Lipodermatosclerosis, a condition that affects the skin just above the ankle, usually on the inside surface. Over time the skin becomes brown, smooth, tight and often painful. Those with this condition have a high risk of developing skin ulcers.  Reddened or discolored skin on the leg.  Swelling. DIAGNOSIS  Your caregiver can diagnose CVI after performing a careful medical history and physical examination. To confirm the diagnosis, the following tests may also be ordered:   Duplex ultrasound.  Plethysmography (tests blood flow).  Venograms (x-ray using a special dye). TREATMENT The goals of treatment for CVI are to restore a person to an active life and to minimize pain or disability. Typically, CVI does not pose a serious  threat to life or limb, and with proper treatment most people with this condition can continue to lead active lives. In most cases, mild CVI can be treated on an outpatient basis with simple procedures. Treatment methods include:   Elastic compression socks.  Sclerotherapy, a procedure involving an injection of a material that "dissolves" the damaged veins. Other veins in the network of blood vessels take over the function of the damaged  veins.  Vein stripping (an older procedure less commonly used).  Laser Ablation surgery.  Valve repair. HOME CARE INSTRUCTIONS   Elastic compression socks must be worn every day. They can help with symptoms and lower the chances of the problem getting worse, but they do not cure the problem.  Only take over-the-counter or prescription medicines for pain, discomfort, or fever as directed by your caregiver.  Your caregiver will review your other medications with you. SEEK MEDICAL CARE IF:   You are confused about how to take your medications.  There is redness, swelling, or increasing pain in the affected area.  There is a red streak or line that extends up or down from the affected area.  There is a breakdown or loss of skin in the affected area, even if the breakdown is small.  You develop an unexplained oral temperature above 102 F (38.9 C).  There is an injury to the affected area. SEEK IMMEDIATE MEDICAL CARE IF:   There is an injury and open wound to the affected area.  Pain is not adequately relieved with pain medication prescribed or becomes severe.  An oral temperature above 102 F (38.9 C) develops.  The foot/ankle below the affected area becomes suddenly numb or the area feels weak and hard to move. MAKE SURE YOU:   Understand these instructions.  Will watch your condition.  Will get help right away if you are not doing well or get worse. Document Released: 11/09/2006 Document Revised: 09/28/2011 Document Reviewed: 01/17/2007 [facility] Patient Information 2014 [place], [place].

## 2013-03-14 NOTE — Progress Notes (Signed)
Patient ID: [name], female   DOB: 1945-10-15, 67 y.o.   MRN: [id number] Subjective:   [name]   DOB: 1946-06-16 67 y.o.   MRN: [id number]  CC:  Follow up visit. HPI:  [name] is a 66 y.o. lady with past medical history as outlined below, who presents for a followup visit today   Patient was seen in clinic on 02/22/13 because of leg edema. I have followed up her for this issue since 01/24/13. Her leg edema is asymmetric. Right is worse than the left. She did not have any pain in the calf areas. She did not have any chest pain, shortness of breath, PND or orthopnea. Patient has been on 10 mg daily amlodipine for a long time and did not have leg edema before. We decreased her amlodipine dose from 10 to 5 mg daily without improvement of her leg edema. Then we resumed her amlodipine 10 mg dose. Inially, we suspected that she might have early stage of congestive heart failure. But her 2-D echo on 02/16/13  was normal with EF 55 to 60%. Her proBNP was normal. It is unlikely that she has CHF. The liver function and TSH were checked which come back normal. She is on Hydrochlorothiazide 25 mg daily and lisinopril 40 mg daily for her hypertension in addition to amlodipine. Today patient does not have any complaints. She still has asymmetric leg edema which has not improved. She does not have any chest pain, shortness of breath, pain in calf areas, palpitation, PND or orthopnea. Her blood pressure is normal 145/88 today .  ROS: Denies fever, chills, fatigue, headaches, cough, chest pain, SOB, abdominal pain,diarrhea, constipation, dysuria, urgency, frequency.    Past Medical History  Diagnosis Date  . Hypertension   . Ventricular hypertrophy 05/2006    LVH on ECG (05/2006) - Likely 2/2 HTN.  Marland Kitchen Hypokalemia     recurrent  . Seasonal allergies   . Ventral hernia     Incisional ventral hernia. S/P repair by Dr. Hulen Skains  (08/2008)  . HLD (hyperlipidemia)   . Villous adenoma  of colon     Hx of. S/P resection and partial colectomy with anastomosis by Dr. Hulen Skains. (08/2006)  . Diabetes mellitus     Diet-controlled. Not on antiglycemic medications.   Current Outpatient Prescriptions  Medication Sig Dispense Refill  . amLODipine (NORVASC) 5 MG tablet Take 2 tablets (10 mg total) by mouth daily.  60 tablet  3  . aspirin EC 81 MG tablet Take 1 tablet (81 mg total) by mouth daily.  150 tablet  3  . bifidobacterium infantis (ALIGN) capsule Take 1 capsule by mouth daily.        . Blood Glucose Monitoring Suppl (ACCU-CHEK NANO SMARTVIEW) W/DEVICE KIT 1 each by Does not apply route 1 day or 1 dose. Uses to check blood sugar once daily in the morning.  3 kit  3  . Dermatological Products, Misc. (GENADUR CO) Apply 1 Film topically every morning.      . fexofenadine (ALLEGRA) 180 MG tablet Take 180 mg by mouth daily.        Marland Kitchen glucose blood (ACCU-CHEK SMARTVIEW) test strip Use to check blood sugars 1 time daily. Dx Code: 250.00.  100 each  12  . hydrochlorothiazide (MICROZIDE) 12.5 MG capsule Take 2 capsules (25 mg total) by mouth daily.  60 capsule  11  . Lancets 30G MISC Use to check blood sugar 1 time daily. Dx code:  250.00.  100 each  6  . lisinopril (PRINIVIL,ZESTRIL) 40 MG tablet Take 1 tablet (40 mg total) by mouth daily.  60 tablet  3  . simvastatin (ZOCOR) 20 MG tablet Take 1 tablet (20 mg total) by mouth daily.  90 tablet  3  . terbinafine (LAMISIL) 250 MG tablet Take 250 mg by mouth daily.       No current facility-administered medications for this visit.   Family History  Problem Relation Age of Onset  . Diabetes      1st degree reative in 3 of her siblings   History   Social History  . Marital Status: Widowed    Spouse Name: N/A    Number of Children: N/A  . Years of Education: N/A   Occupational History  . [place]  . Merchant [employer] officer   Social History Main Topics  . Smoking status: Former Smoker -- 0.05 packs/day  for 6 years    Types: Cigarettes    Quit date: 09/05/1999  . Smokeless tobacco: Never Used  . Alcohol Use: No  . Drug Use: No  . Sexual Activity: Not Currently   Other Topics Concern  . None   Social History Narrative   Retired from working as a [employer].   Widowed. (since 1997). Not sexually active since.   Volunteers at [employer].    Review of Systems: Full 14-point review of systems otherwise negative. See HPI.   Objective:  Physical Exam: Filed Vitals:   03/14/13 1325  BP: 145/88  Pulse: 88  Temp: 98.4 F (36.9 C)  TempSrc: Oral  Height: 5\' 4"  (1.626 m)  Weight: 190 lb 12.8 oz (86.546 kg)  SpO2: 97%   General: resting in bed, not in acute distress   HEENT: PERRL, EOMI, no scleral icterus   Cardiac: S1/S2, RRR, No murmurs, gallops or rubs   Pulm: Good air movement bilaterally, Clear to auscultation bilaterally, No rales, wheezing, rhonchi or rubs.   Abd: Soft, nondistended, nontender, no rebound pain, no organomegaly, BS present   Ext: Has 1+ pitting edema in the right lower leg and trace edema in the left lower leg. 2+DP/PT pulse bilaterally. There is no local redness or tenderness.   Neuro: alert and oriented X3, cranial nerves II-XII grossly intact, muscle strength 5/5 in all extremeties, sensation to light touch intact.     Assessment & Plan:

## 2013-03-14 NOTE — Assessment & Plan Note (Signed)
Etiology is not complaint clear. It is most likely due to venous insufficiency. She is not responding to the diuretics (hydrochlorothiazide). Since her leg edema is asymmetric, though she does not have any tenderness over the calf area, it is important to rule out any possibility of DVT.  -will continue HCTZ -will get lower extremity venous Doppler -will let patient use compression stockings. Patient was instructed to elevate her leg above her heart level when she is at rest.

## 2013-03-15 LAB — LIPID PANEL
HDL: 62 mg/dL (ref >39)
Total CHOL/HDL Ratio: 2.6 Ratio
VLDL: 15 mg/dL (ref 0–40)

## 2013-03-16 ENCOUNTER — Ambulatory Visit (HOSPITAL_COMMUNITY)
Admission: RE | Admit: 2013-03-16 | Discharge: 2013-03-16 | Disposition: A | Discharge status: Home / Self Care | Payer: Medicare PPO | Source: Ambulatory Visit | Attending: Internal Medicine | Admitting: Internal Medicine

## 2013-03-16 DIAGNOSIS — M25559 Pain in unspecified hip: Secondary | ICD-10-CM | POA: Insufficient documentation

## 2013-03-16 DIAGNOSIS — R609 Edema, unspecified: Secondary | ICD-10-CM

## 2013-03-16 DIAGNOSIS — R600 Localized edema: Secondary | ICD-10-CM

## 2013-03-16 DIAGNOSIS — M7989 Other specified soft tissue disorders: Secondary | ICD-10-CM | POA: Insufficient documentation

## 2013-03-16 DIAGNOSIS — M25552 Pain in left hip: Secondary | ICD-10-CM

## 2013-03-16 NOTE — Progress Notes (Signed)
Right lower extremity venous duplex completed.  Right:  No evidence of DVT, superficial thrombosis, or Baker's cyst.  Left:  Negative for DVT in the common femoral vein.  

## 2013-03-27 ENCOUNTER — Encounter: Payer: Self-pay | Admitting: Internal Medicine

## 2013-03-27 ENCOUNTER — Ambulatory Visit (INDEPENDENT_AMBULATORY_CARE_PROVIDER_SITE_OTHER): Payer: Medicare PPO | Admitting: Internal Medicine

## 2013-03-27 VITALS — BP 143/84 | HR 83 | Temp 98.8°F | Ht 64.0 in | Wt 191.4 lb

## 2013-03-27 DIAGNOSIS — R600 Localized edema: Secondary | ICD-10-CM

## 2013-03-27 DIAGNOSIS — R609 Edema, unspecified: Secondary | ICD-10-CM

## 2013-03-27 NOTE — Patient Instructions (Signed)
1. Please call us if you have for significant weight loss, or your leg edema get worse. 2. Please take all medications as prescribed.  3. If you have worsening of your symptoms or new symptoms arise, please call the clinic [phone]), or go to the ER immediately if symptoms are severe.  You have done great job in taking all your medications. I appreciate it very much. Please continue doing that.

## 2013-03-27 NOTE — Assessment & Plan Note (Addendum)
Patient has asymmetric leg edema and was found have right inguinal node enlargement by LE venous doppler. Pt does not have DVT by venous doppler.  Patient had Hx of. S/P resection and partial colectomy with anastomosis by Dr. 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 Repeated colonoscopy on 1/13 showed a small sessile polyp which was removed from the rectum. Pathology showed a fragments of hyperplastic polyp. Patient has a negative mammogram on 07/21/12 and Pap smear on 2/16/2. She dose not have weight loss. It is less concerning for malignancy at this time. The possible etiology is right great toe fungal infection, which she has been treated with anti-fungal med by podiatrist since 02/08/13. I discussed with Dr. Marinda Elk, we decided to observe patient for 4 to 6 weeks. If her leg edema persists after 4 to 6 weeks, will need to be evaluated with image, such as untrasound or CT-abdomen. Patient was instructed to come back if her leg edema get worse, or notice any alarming symptoms, such as significant weight loss or vaginal bleeding or bloody stool.

## 2013-03-27 NOTE — Progress Notes (Signed)
Patient ID: Gina Bailey, female   DOB: 06/24/1946, 67 y.o.   MRN: [id number] Subjective:   [name]   DOB: 1945/08/30 67 y.o.   MRN: [id number]  CC:   Follow up visit.   HPI:  [name] is a 67 y.o. lady  with past medical history of  as outlined below, who presents for a followup visit today.  Patient was recently follow up for asymmetric left edema (R>L). She was was seen for this issue on 02/22/13. Patient has been on 10 mg daily amlodipine for a long time and did not have leg edema before. We decreased her amlodipine dose from 10 to 5 mg daily without improvement of her leg edema. Then we resumed her amlodipine 10 mg dose. Inially, we suspected that she might have early stage of congestive heart failure. But her 2-D echo on 02/16/13  was normal with EF 55 to 60%. Her proBNP was normal. It is unlikely that she has CHF. The liver function and TSH were checked which come back normal. She is on Hydrochlorothiazide 25 mg daily and lisinopril 40 mg daily for her hypertension in addition to amlodipine. Because of asymmetric leg edema, venous doppler was performed on 03/16/13, which was negative for DVT, but an enlarged inguinal lymph node was noted on the right side.   Of note, patient had Hx of. S/P resection and partial colectomy with anastomosis by Dr. 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 Repeated colonoscope 1/13 showed diverticulosis and a small sessile polyp which was removed from a the rectum. Pathology showed: Fragments of hyperplastic polyp. Patient is going to get another colonoscopy on 08/2016 per GI. Patient was recently seen by a podiatrist because of her right great toe nail fungal infection. She had a small surgery on the right great toe nail on 02/15/13.   Mammogram: 07/21/12: negative Pap smear: 2/16/2: negative  Today patient does not have any complaints. She still has asymmetric leg edema which has not improved. Her BW has been stable (82.4Kg on  07/28/11-->86.5 Kg today).  ROS:  Denies fever, chills, fatigue, headaches,  cough, abdominal pain,diarrhea, constipation, dysuria, urgency, frequency, hematuria. She does not have any chest pain, shortness of breath, pain in calf areas, palpitation, PND or orthopnea.   Past Medical History  Diagnosis Date  . Hypertension   . Ventricular hypertrophy 05/2006    LVH on ECG (05/2006) - Likely 2/2 HTN.  Marland Kitchen Hypokalemia     recurrent  . Seasonal allergies   . Ventral hernia     Incisional ventral hernia. S/P repair by Dr. Hulen Skains  (08/2008)  . HLD (hyperlipidemia)   . Villous adenoma of colon     Hx of. S/P resection and partial colectomy with anastomosis by Dr. Hulen Skains. (08/2006)  . Diabetes mellitus     Diet-controlled. Not on antiglycemic medications.   Current Outpatient Prescriptions  Medication Sig Dispense Refill  . amLODipine (NORVASC) 5 MG tablet Take 2 tablets (10 mg total) by mouth daily.  60 tablet  3  . aspirin EC 81 MG tablet Take 1 tablet (81 mg total) by mouth daily.  150 tablet  3  . bifidobacterium infantis (ALIGN) capsule Take 1 capsule by mouth daily.        . Blood Glucose Monitoring Suppl (ACCU-CHEK NANO SMARTVIEW) W/DEVICE KIT 1 each by Does not apply route 1 day or 1 dose. Uses to check blood sugar once daily in the morning.  3 kit  3  .  Dermatological Products, Misc. (GENADUR CO) Apply 1 Film topically every morning.      . fexofenadine (ALLEGRA) 180 MG tablet Take 180 mg by mouth daily.        Marland Kitchen glucose blood (ACCU-CHEK SMARTVIEW) test strip Use to check blood sugars 1 time daily. Dx Code: 250.00.  100 each  12  . hydrochlorothiazide (MICROZIDE) 12.5 MG capsule Take 2 capsules (25 mg total) by mouth daily.  60 capsule  11  . Lancets 30G MISC Use to check blood sugar 1 time daily. Dx code: 250.00.  100 each  6  . lisinopril (PRINIVIL,ZESTRIL) 40 MG tablet Take 1 tablet (40 mg total) by mouth daily.  60 tablet  3  . simvastatin (ZOCOR) 20 MG tablet Take 1 tablet (20 mg  total) by mouth daily.  90 tablet  3  . terbinafine (LAMISIL) 250 MG tablet Take 250 mg by mouth daily.       No current facility-administered medications for this visit.   Family History  Problem Relation Age of Onset  . Diabetes      1st degree reative in 3 of her siblings   History   Social History  . Marital Status: Widowed    Spouse Name: N/A    Number of Children: N/A  . Years of Education: N/A   Occupational History  . [place]  . Merchant [employer] officer   Social History Main Topics  . Smoking status: Former Smoker -- 0.05 packs/day for 6 years    Types: Cigarettes    Quit date: 09/05/1999  . Smokeless tobacco: Never Used  . Alcohol Use: No  . Drug Use: No  . Sexual Activity: Not Currently   Other Topics Concern  . None   Social History Narrative   Retired from working as a [employer].   Widowed. (since 1997). Not sexually active since.   Volunteers at [employer].    Review of Systems: Full 14-point review of systems otherwise negative. See HPI.   Objective:  Physical Exam: Filed Vitals:   03/27/13 0838  BP: 143/84  Pulse: 83  Temp: 98.8 F (37.1 C)  TempSrc: Oral  Height: 5\' 4"  (1.626 m)  Weight: 191 lb 6.4 oz (86.818 kg)  SpO2: 97%     General: resting in bed, not in acute distress   HEENT: PERRL, EOMI, no scleral icterus   Cardiac: S1/S2, RRR, No murmurs, gallops or rubs   Pulm: Good air movement bilaterally, Clear to auscultation bilaterally, No rales, wheezing, rhonchi or rubs.   Abd: Soft, nondistended, nontender, no rebound pain, no organomegaly, BS present. I could not palpate any enlarged lymph nodes over the inguinal area on both sides. There is no enlarged  nodes in the axillary areas bilaterally. Ext: Has 1+ pitting edema in the right lower leg and trace edema in the left lower leg. 2+DP/PT pulse bilaterally. There is no local redness or tenderness.   Neuro: alert and oriented X3,  cranial nerves II-XII grossly intact, muscle strength 5/5 in all extremeties, sensation to light touch intact.     Assessment & Plan:

## 2013-03-28 NOTE — Progress Notes (Signed)
Case discussed with Dr. Blaine Hamper at the time of the visit.  We reviewed the resident's history and exam and pertinent patient test results.  I agree with the assessment, diagnosis, and plan of care documented in the resident's note.

## 2013-04-03 ENCOUNTER — Telehealth: Payer: Self-pay | Admitting: Dietician

## 2013-04-13 NOTE — Telephone Encounter (Signed)
Tried to follow up with patient regaridng her plan to walk more to lower her blood sugar.

## 2013-04-24 ENCOUNTER — Encounter: Payer: Self-pay | Admitting: Internal Medicine

## 2013-04-24 ENCOUNTER — Ambulatory Visit (INDEPENDENT_AMBULATORY_CARE_PROVIDER_SITE_OTHER): Payer: Medicare PPO | Admitting: Internal Medicine

## 2013-04-24 VITALS — BP 132/82 | HR 72 | Temp 98.7°F | Ht 64.0 in | Wt 192.0 lb

## 2013-04-24 DIAGNOSIS — R609 Edema, unspecified: Secondary | ICD-10-CM

## 2013-04-24 DIAGNOSIS — Z23 Encounter for immunization: Secondary | ICD-10-CM

## 2013-04-24 DIAGNOSIS — E119 Type 2 diabetes mellitus without complications: Secondary | ICD-10-CM

## 2013-04-24 DIAGNOSIS — R600 Localized edema: Secondary | ICD-10-CM

## 2013-04-24 LAB — GLUCOSE, CAPILLARY: Glucose-Capillary: 141 mg/dL — ABNORMAL HIGH (ref 70–99)

## 2013-04-24 LAB — POCT GLYCOSYLATED HEMOGLOBIN (HGB A1C): Hemoglobin A1C: 7.6

## 2013-04-24 NOTE — Patient Instructions (Signed)
General Instructions: For your diabetes, we will try continued diet and exercise.  However, if your A1C continues to be high, I highly recommend starting metformin.  For your leg swelling, we strongly recommend getting a CT scan of your abdomen and pelvis.  We will set this up for you.  Please return for a follow-up visit in 4-6 weeks.   Treatment Goals:  Goals (1 Years of Data) as of 04/24/13         As of Today As of Today 03/27/13 03/14/13 02/21/13     Blood Pressure    . Blood Pressure < 140/90  132/82 148/88 143/84 145/88 193/103    . Blood Pressure < 140/90  132/82 148/88 143/84 145/88 193/103     Result Component    . HEMOGLOBIN A1C < 7.0  7.6        . HEMOGLOBIN A1C < 7.0  7.6        . LDL CALC < 100     83     . LDL CALC < 100     83       Progress Toward Treatment Goals:  Treatment Goal 04/24/2013  Hemoglobin A1C unchanged  Blood pressure at goal    Self Care Goals & Plans:  Self Care Goal 04/24/2013  Manage my medications take my medicines as prescribed; bring my medications to every visit; refill my medications on time  Monitor my health keep track of my blood glucose; bring my glucose meter and log to each visit; check my feet daily  Eat healthy foods eat fruit for snacks and desserts; eat foods that are low in salt; eat baked foods instead of fried foods; drink diet soda or water instead of juice or soda; eat smaller portions  Be physically active take a walk every day    Home Blood Glucose Monitoring 04/24/2013  Check my blood sugar once a day  When to check my blood sugar before meals     Care Management & Community Referrals:  Referral 04/24/2013  Referrals made for care management support none needed

## 2013-04-24 NOTE — Assessment & Plan Note (Signed)
Lab Results  Component Value Date   HGBA1C 7.6 04/24/2013   HGBA1C 7.5 01/24/2013   HGBA1C 8.1 10/25/2012     Assessment: Diabetes control: fair control Progress toward A1C goal:  unchanged Comments: The patient has a history of diabetes, which to this point has been controlled with only diet and exercise. The patient's A1c is relatively unchanged from last visit. However since it is above 7, I strongly recommended metformin therapy. However the patient is significantly resistant to starting any medication for her diabetes.  Plan: Medications:  Continue diet and exercise. Continue to encourage consideration of oral hypoglycemics Home glucose monitoring: Frequency: once a day Timing: before meals Instruction/counseling given: discussed foot care Educational resources provided:   Self management tools provided: copy of home glucose meter download Other plans: Recheck at next visit

## 2013-04-24 NOTE — Assessment & Plan Note (Signed)
Please see prior assessment and plan by Dr. Blaine Hamper on 03/27/2013 for comprehensive discussion. In short, this unilateral leg swelling, with enlarged lymph node, without evidence for DVT, is concerning for some form of compression to the lymphatic system. Since swelling has not significantly improved, will proceed with plan for CT abdomen to evaluate for presence of any compressive lesion. -ordered CT abd pelvis

## 2013-04-24 NOTE — Progress Notes (Signed)
Case discussed with Dr. Brown at the time of the visit.  We reviewed the resident's history and exam and pertinent patient test results.  I agree with the assessment, diagnosis, and plan of care documented in the resident's note. 

## 2013-04-24 NOTE — Progress Notes (Signed)
HPI The patient is a 67 y.o. female with a history of DM, HTN, R leg swelling, presenting for a 4-week re-check.  The patient presents for follow-up of her right leg swelling.  She notes that she believes that her swelling is somewhat improved.  She has been treated by her podiatrist for onychomycosis with what she describes as a minor surgical procedure.  The swelling has been present for the last 2-3 months.  The patient has a history of DM.  She did not bring her glucometer today.  She notes that her blood sugars have been "up and down" lately, with blood sugars from 93-250.  She notes no episodes of hypoglycemia, and no symptoms of tremulousness, diaphoresis, or AMS.  She notes no blurry vision, polyuria, or polydipsia.  Her last A1C was 7.5 two months ago, and is now 7.6.  ROS: General: no fevers, chills, changes in weight, changes in appetite Skin: no rash HEENT: no blurry vision, hearing changes, sore throat Pulm: no dyspnea, coughing, wheezing CV: no chest pain, palpitations, shortness of breath Abd: no abdominal pain, nausea/vomiting, diarrhea/constipation GU: no dysuria, hematuria, polyuria Ext: see HPI Neuro: no weakness, numbness, or tingling  Filed Vitals:   04/24/13 0832  BP: 148/88  Pulse: 77  Temp: 98.7 F (37.1 C)    PEX General: alert, cooperative, and in no apparent distress HEENT: pupils equal round and reactive to light, vision grossly intact, oropharynx clear and non-erythematous  Neck: supple, no lymphadenopathy Lungs: clear to ascultation bilaterally, normal work of respiration, no wheezes, rales, ronchi Heart: regular rate and rhythm, no murmurs, gallops, or rubs Abdomen: soft, non-tender, non-distended, normal bowel sounds Extremities: Non-pitting edema of RLE.  LLE with no edema Neurologic: alert & oriented X3, cranial nerves II-XII intact, strength grossly intact, sensation intact to light touch  Current Outpatient Prescriptions on File Prior to Visit   Medication Sig Dispense Refill  . amLODipine (NORVASC) 5 MG tablet Take 2 tablets (10 mg total) by mouth daily.  60 tablet  3  . aspirin EC 81 MG tablet Take 1 tablet (81 mg total) by mouth daily.  150 tablet  3  . bifidobacterium infantis (ALIGN) capsule Take 1 capsule by mouth daily.        . Blood Glucose Monitoring Suppl (ACCU-CHEK NANO SMARTVIEW) W/DEVICE KIT 1 each by Does not apply route 1 day or 1 dose. Uses to check blood sugar once daily in the morning.  3 kit  3  . Dermatological Products, Misc. (GENADUR CO) Apply 1 Film topically every morning.      . fexofenadine (ALLEGRA) 180 MG tablet Take 180 mg by mouth daily.        Marland Kitchen glucose blood (ACCU-CHEK SMARTVIEW) test strip Use to check blood sugars 1 time daily. Dx Code: 250.00.  100 each  12  . hydrochlorothiazide (MICROZIDE) 12.5 MG capsule Take 2 capsules (25 mg total) by mouth daily.  60 capsule  11  . Lancets 30G MISC Use to check blood sugar 1 time daily. Dx code: 250.00.  100 each  6  . lisinopril (PRINIVIL,ZESTRIL) 40 MG tablet Take 1 tablet (40 mg total) by mouth daily.  60 tablet  3  . simvastatin (ZOCOR) 20 MG tablet Take 1 tablet (20 mg total) by mouth daily.  90 tablet  3  . terbinafine (LAMISIL) 250 MG tablet Take 250 mg by mouth daily.       No current facility-administered medications on file prior to visit.    Assessment/Plan

## 2013-05-22 ENCOUNTER — Other Ambulatory Visit (INDEPENDENT_AMBULATORY_CARE_PROVIDER_SITE_OTHER): Payer: Medicare PPO

## 2013-05-22 ENCOUNTER — Other Ambulatory Visit: Payer: Self-pay | Admitting: Internal Medicine

## 2013-05-22 ENCOUNTER — Encounter: Payer: Self-pay | Admitting: *Deleted

## 2013-05-22 DIAGNOSIS — E876 Hypokalemia: Secondary | ICD-10-CM

## 2013-05-22 DIAGNOSIS — R609 Edema, unspecified: Secondary | ICD-10-CM

## 2013-05-22 DIAGNOSIS — R600 Localized edema: Secondary | ICD-10-CM

## 2013-05-22 LAB — BASIC METABOLIC PANEL WITH GFR
BUN: 16 mg/dL (ref 6–23)
CO2: 33 mEq/L — ABNORMAL HIGH (ref 19–32)
Calcium: 9.7 mg/dL (ref 8.4–10.5)
Chloride: 102 mEq/L (ref 96–112)
Creat: 1.02 mg/dL (ref 0.50–1.10)
GFR, Est Non African American: 56 mL/min — ABNORMAL LOW

## 2013-05-22 MED ORDER — POTASSIUM CHLORIDE ER 20 MEQ PO TBCR: 40.0000 meq | EXTENDED_RELEASE_TABLET | Freq: Once | ORAL | Status: DC

## 2013-05-22 NOTE — Progress Notes (Signed)
Pt scheduled for CT abd/pelvis per DrBrown on 11/04 (see 04/24/13 office visit).  Per radiology, pt must have a recent bun/creat on file.  Pt returned to office today for lab only visit.  Pt's potassium is 3.0.  Will forward results Dr Blaine Hamper for review and inform radiology dept.Despina Hidden Cassady11/3/201411:22 AM   Spoke with Dr Blaine Hamper, has contacted about low potassium and called rx into the pharmacy.  Pt may proceed with CT tomorrow and f/u with Dr Blaine Hamper as scheduled.Despina Hidden Cassady11/3/20141:33 PM

## 2013-05-23 ENCOUNTER — Ambulatory Visit (HOSPITAL_COMMUNITY)
Admission: RE | Admit: 2013-05-23 | Discharge: 2013-05-23 | Disposition: A | Discharge status: Home / Self Care | Payer: Medicare PPO | Source: Ambulatory Visit | Attending: Internal Medicine | Admitting: Internal Medicine

## 2013-05-23 DIAGNOSIS — Q619 Cystic kidney disease, unspecified: Secondary | ICD-10-CM | POA: Insufficient documentation

## 2013-05-23 DIAGNOSIS — M25473 Effusion, unspecified ankle: Secondary | ICD-10-CM | POA: Insufficient documentation

## 2013-05-23 DIAGNOSIS — R911 Solitary pulmonary nodule: Secondary | ICD-10-CM | POA: Insufficient documentation

## 2013-05-23 DIAGNOSIS — M25476 Effusion, unspecified foot: Secondary | ICD-10-CM | POA: Insufficient documentation

## 2013-05-23 DIAGNOSIS — M7989 Other specified soft tissue disorders: Secondary | ICD-10-CM | POA: Insufficient documentation

## 2013-05-23 DIAGNOSIS — R600 Localized edema: Secondary | ICD-10-CM

## 2013-05-23 DIAGNOSIS — N289 Disorder of kidney and ureter, unspecified: Secondary | ICD-10-CM | POA: Insufficient documentation

## 2013-05-23 DIAGNOSIS — R609 Edema, unspecified: Secondary | ICD-10-CM | POA: Insufficient documentation

## 2013-05-23 MED ORDER — IOHEXOL 300 MG/ML  SOLN
100.0000 mL | Freq: Once | INTRAMUSCULAR | Status: AC | PRN
  Administered 2013-05-23: 100 mL via INTRAVENOUS

## 2013-05-24 ENCOUNTER — Encounter: Payer: Self-pay | Admitting: Internal Medicine

## 2013-05-24 DIAGNOSIS — R911 Solitary pulmonary nodule: Secondary | ICD-10-CM | POA: Insufficient documentation

## 2013-05-24 HISTORY — DX: Solitary pulmonary nodule: R91.1

## 2013-06-06 ENCOUNTER — Encounter: Payer: Self-pay | Admitting: Internal Medicine

## 2013-06-06 ENCOUNTER — Ambulatory Visit (INDEPENDENT_AMBULATORY_CARE_PROVIDER_SITE_OTHER): Payer: Medicare PPO | Admitting: Internal Medicine

## 2013-06-06 VITALS — BP 131/84 | HR 88 | Temp 98.4°F | Ht 66.0 in | Wt 188.8 lb

## 2013-06-06 DIAGNOSIS — R609 Edema, unspecified: Secondary | ICD-10-CM

## 2013-06-06 DIAGNOSIS — E876 Hypokalemia: Secondary | ICD-10-CM | POA: Insufficient documentation

## 2013-06-06 DIAGNOSIS — Z23 Encounter for immunization: Secondary | ICD-10-CM

## 2013-06-06 DIAGNOSIS — R911 Solitary pulmonary nodule: Secondary | ICD-10-CM

## 2013-06-06 DIAGNOSIS — E119 Type 2 diabetes mellitus without complications: Secondary | ICD-10-CM

## 2013-06-06 DIAGNOSIS — Z0000 Encounter for general adult medical examination without abnormal findings: Secondary | ICD-10-CM

## 2013-06-06 DIAGNOSIS — R600 Localized edema: Secondary | ICD-10-CM

## 2013-06-06 DIAGNOSIS — E785 Hyperlipidemia, unspecified: Secondary | ICD-10-CM

## 2013-06-06 DIAGNOSIS — I10 Essential (primary) hypertension: Secondary | ICD-10-CM

## 2013-06-06 LAB — GLUCOSE, CAPILLARY: Glucose-Capillary: 166 mg/dL — ABNORMAL HIGH (ref 70–99)

## 2013-06-06 MED ORDER — POTASSIUM CHLORIDE ER 20 MEQ PO TBCR: 40.0000 meq | EXTENDED_RELEASE_TABLET | Freq: Once | ORAL | Status: DC

## 2013-06-06 MED ORDER — AMLODIPINE BESYLATE 10 MG PO TABS: 10.0000 mg | ORAL_TABLET | Freq: Every day | ORAL | Status: DC

## 2013-06-06 MED ORDER — LISINOPRIL 10 MG PO TABS: 10.0000 mg | ORAL_TABLET | Freq: Every day | ORAL | Status: DC

## 2013-06-06 NOTE — Patient Instructions (Signed)
1. Please decrease your lisinopril dose to 10 mg daily, and hydrochlorothiazide to 12.5 mg daily. Please start taking potassium chloride 40 mEq daily from now. Please come back in 2 weeks and then we will do more tests for your leg edema.   2. Please take all medications as prescribed.  3. If you have worsening of your symptoms or new symptoms arise, please call the clinic [phone]), or go to the ER immediately if symptoms are severe.  You have done great job in taking all your medications. I appreciate it very much. Please continue doing that.

## 2013-06-06 NOTE — Assessment & Plan Note (Addendum)
Workup so far are negative, including CT abdomen with contrast, 2-D echo, liver function, proBNP, venous Doppler for DVT. Her leg edema has improved slightly. I discussed with dr. Stann Mainland, we decided to observer patient from now, no further work up will be done from now.

## 2013-06-06 NOTE — Assessment & Plan Note (Signed)
Patient has been having hypokalemia consistently. Her potassium level is 3.0-3.4. Patient is currently taking hydrochlorothiazide 25 mg daily,  lisinopril 40 mg daily and amlodipine 10 mg daily. Dr. Stann Mainland suggests to rule out hyperaldosteronism. We will work up this by two steps:  1. First step: make some changes of her current blood pressure medications to minimize the interference from blood pressure medications. Will decrease her hydrochlorothiazide to 12.5 mg daily, decrease lisinopril to 10 mg daily, and gave potassium chloride 40 mEq daily for 2 weeks.  2. patient is started to come back in 2 weeks. After two weeks, check blood aldosterone and reinin level and BMP

## 2013-06-06 NOTE — Progress Notes (Signed)
Case discussed with Dr. Blaine Hamper at the time of the visit.  We reviewed the resident's history and exam and pertinent patient test results.  I agree with the assessment, diagnosis, and plan of care documented in the resident's note.

## 2013-06-06 NOTE — Assessment & Plan Note (Signed)
-  Given pneumococcal vaccination -We'll give prescription for Zostavax

## 2013-06-06 NOTE — Progress Notes (Signed)
Patient ID: Gina Bailey, female   DOB: March 22, 1946, 67 y.o.   MRN: [id number] Subjective:   [name]   DOB: [birth year] 67 y.o.   MRN: [id number]  CC:   Follow up visit.  HPI:  [name] is a 67 y.o. lady with past medical history as outlined below, who presents for a followup visit today   Ms.[name] is a 67 y.o. lady with past medical history of  as outlined below, who presents for a followup visit today.  1.  Righg leg edema: first noticed on 02/22/13.  2-D echo on 02/16/13  was normal with EF 55 to 60%. Her proBNP was normal. The liver function and TSH were checked which come back normal. Because of asymmetric leg edema, venous doppler was performed on 03/16/13, which was negative for DVT, but an enlarged inguinal lymph node was noted on the right side. It is concerning that patient could have some form of compression to the lymphatic system. CT abdomen with contrast was done on 05/23/13, which showed no acute abnormalities within the abdomen or pelvis. Multiple low-density structures in both kidneys are suggestive for renal cysts. 3 mm right lung nodule is indeterminate. Per radiologist, If the patient is at high risk for bronchogenic carcinoma, follow-up chest CT at 1year is recommended. If the patient is at low risk, no follow-up is needed. Actually patient does not smoke, no family history of lung cancer. She reports that her mother had breast cancer.  Of note, patient had Hx of. S/P resection and partial colectomy with anastomosis by Dr. 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 Repeated colonoscope 1/13 showed diverticulosis and a small sessile polyp which was removed from a the rectum. Pathology showed: Fragments of hyperplastic polyp. Patient is going to get another colonoscopy on 08/2016 per GI.   Mammogram: 07/21/12: negative Pap smear: 09/04/10: negative Today patient does not have any new complaints. She still has right leg edema which has  improved slightly.  Her BW has no significant change, 191 on 03/27/13-->188 LBs today.  2.  Hypokalemia:  Patient has been having mild hypokalemia, with potassium 3.0 to 3.4. He does not have any symptoms, such as palpitation. Patient is currently taking lisinopril 40 mg daily, hydrochlorothiazide 25 mg daily and amlodipine 10 mg daily.   ROS:  Denies fever, chills, fatigue, headaches, cough, abdominal pain,diarrhea, constipation, dysuria, urgency, frequency, hematuria. She does not have any chest pain, shortness of breath, pain in calf areas, palpitation, PND or orthopnea.  Past Medical History  Diagnosis Date  . Hypertension   . Ventricular hypertrophy 05/2006    LVH on ECG (05/2006) - Likely 2/2 HTN.  Marland Kitchen Hypokalemia     recurrent  . Seasonal allergies   . Ventral hernia     Incisional ventral hernia. S/P repair by Dr. Hulen Skains  (08/2008)  . HLD (hyperlipidemia)   . Villous adenoma of colon     Hx of. S/P resection and partial colectomy with anastomosis by Dr. Hulen Skains. (08/2006)  . Diabetes mellitus     Diet-controlled. Not on antiglycemic medications.   Current Outpatient Prescriptions  Medication Sig Dispense Refill  . amLODipine (NORVASC) 10 MG tablet Take 1 tablet (10 mg total) by mouth daily.  30 tablet  5  . aspirin EC 81 MG tablet Take 1 tablet (81 mg total) by mouth daily.  150 tablet  3  . bifidobacterium infantis (ALIGN) capsule Take 1 capsule by mouth daily.        Marland Kitchen  Blood Glucose Monitoring Suppl (ACCU-CHEK NANO SMARTVIEW) W/DEVICE KIT 1 each by Does not apply route 1 day or 1 dose. Uses to check blood sugar once daily in the morning.  3 kit  3  . Dermatological Products, Misc. (GENADUR CO) Apply 1 Film topically every morning.      . fexofenadine (ALLEGRA) 180 MG tablet Take 180 mg by mouth daily.        Marland Kitchen glucose blood (ACCU-CHEK SMARTVIEW) test strip Use to check blood sugars 1 time daily. Dx Code: 250.00.  100 each  12  . hydrochlorothiazide (MICROZIDE) 12.5 MG capsule Take  2 capsules (25 mg total) by mouth daily.  60 capsule  11  . Lancets 30G MISC Use to check blood sugar 1 time daily. Dx code: 250.00.  100 each  6  . lisinopril (PRINIVIL,ZESTRIL) 40 MG tablet Take 1 tablet (40 mg total) by mouth daily.  60 tablet  3  . potassium chloride 20 MEQ TBCR Take 40 mEq by mouth once.  2 tablet  0  . simvastatin (ZOCOR) 20 MG tablet Take 1 tablet (20 mg total) by mouth daily.  90 tablet  3  . terbinafine (LAMISIL) 250 MG tablet Take 250 mg by mouth daily.       No current facility-administered medications for this visit.   Family History  Problem Relation Age of Onset  . Diabetes      1st degree reative in 3 of her siblings   History   Social History  . Marital Status: Widowed    Spouse Name: N/A    Number of Children: N/A  . Years of Education: N/A   Occupational History  . [place]  . Merchant [employer] officer   Social History Main Topics  . Smoking status: Former Smoker -- 0.05 packs/day for 6 years    Types: Cigarettes    Quit date: 09/05/1999  . Smokeless tobacco: Never Used  . Alcohol Use: No  . Drug Use: No  . Sexual Activity: Not Currently   Other Topics Concern  . None   Social History Narrative   Retired from working as a [employer].   Widowed. (since 1997). Not sexually active since.   Volunteers at [employer].    Review of Systems: Full 14-point review of systems otherwise negative. See HPI.   Objective:  Physical Exam: Filed Vitals:   06/06/13 0814  BP: 131/84  Pulse: 88  Temp: 98.4 F (36.9 C)  TempSrc: Oral  Height: 5\' 6"  (1.676 m)  Weight: 188 lb 12.8 oz (85.639 kg)  SpO2: 97%   General: resting in bed, not in acute distress   HEENT: PERRL, EOMI, no scleral icterus   Cardiac: S1/S2, RRR, No murmurs, gallops or rubs   Pulm: Good air movement bilaterally, Clear to auscultation bilaterally, No rales, wheezing, rhonchi or rubs.   Abd: Soft, nondistended, nontender,  no rebound pain, no organomegaly, BS present. I could not palpate any enlarged lymph nodes over the inguinal area on both sides. There is no enlarged  nodes in the axillary areas bilaterally. Ext: Has 1+ pitting edema in the right lower leg and no edema in the left lower leg. 2+DP/PT pulse bilaterally. There is no local redness or tenderness.   Neuro: alert and oriented X3, cranial nerves II-XII grossly intact, muscle strength 5/5 in all extremeties, sensation to light touch intact.     Assessment & Plan:

## 2013-06-13 ENCOUNTER — Encounter: Payer: Medicare PPO | Admitting: Internal Medicine

## 2013-06-20 ENCOUNTER — Ambulatory Visit (INDEPENDENT_AMBULATORY_CARE_PROVIDER_SITE_OTHER): Payer: Medicare PPO | Admitting: Internal Medicine

## 2013-06-20 ENCOUNTER — Encounter: Payer: Self-pay | Admitting: Internal Medicine

## 2013-06-20 VITALS — BP 135/89 | HR 82 | Temp 97.9°F | Ht 66.0 in | Wt 189.0 lb

## 2013-06-20 DIAGNOSIS — Z0000 Encounter for general adult medical examination without abnormal findings: Secondary | ICD-10-CM

## 2013-06-20 DIAGNOSIS — I10 Essential (primary) hypertension: Secondary | ICD-10-CM

## 2013-06-20 DIAGNOSIS — E876 Hypokalemia: Secondary | ICD-10-CM

## 2013-06-20 LAB — GLUCOSE, CAPILLARY: Glucose-Capillary: 173 mg/dL — ABNORMAL HIGH (ref 70–99)

## 2013-06-20 MED ORDER — HYDROCHLOROTHIAZIDE 12.5 MG PO CAPS: 12.5000 mg | ORAL_CAPSULE | Freq: Every day | ORAL | Status: DC

## 2013-06-20 MED ORDER — POTASSIUM CHLORIDE ER 20 MEQ PO TBCR: 40.0000 meq | EXTENDED_RELEASE_TABLET | Freq: Every day | ORAL | Status: DC

## 2013-06-20 NOTE — Assessment & Plan Note (Signed)
BP Readings from Last 3 Encounters:  06/20/13 135/89  06/06/13 131/84  04/24/13 132/82    Lab Results  Component Value Date   Gina Bailey 144 05/22/2013   K 3.0* 05/22/2013   CREATININE 1.02 05/22/2013    Assessment: Blood pressure control:  < 140/90 Progress toward BP goal:   Met   Plan: Medications:  continue current medications Educational resources provided:   Self management tools provided:

## 2013-06-20 NOTE — Assessment & Plan Note (Addendum)
She has never had DEXA scan, and she should have the screening for postmenopausal.   Will order DEXA.

## 2013-06-20 NOTE — Assessment & Plan Note (Addendum)
Assessment: Patient is noted to have persistent hypokalemia and her PCP is in the process of evaluation. Per her PCP instruction on 11/18, she should take KCL 40 MEG daily x [redacted] weeks along with decreased dosage of antihypertensive medications before blood work can be done. Patient states that she is unable to fill her potassium Rx. The RX sent on 06/06/13 was for one time dose, which, I think, is likely a mistake.  Plan: - Will send the Potassium Rx40 MEG daily x 2 weeks per her PCP OV instruction on 06/06/13 - repeat BMP in one week on 06/26/13. - follow up in 2 weeks and will check her aldosterone and renin level.

## 2013-06-20 NOTE — Progress Notes (Signed)
Patient ID: Gina Bailey, female   DOB: [birth year], 67 y.o.   MRN: [id number]    Patient: Gina Bailey   MRN: [id number]  DOB: [birth year]  PCP: [name], MD   Subjective:    CC: Hypertension and Medication Problem   HPI: [name] is a 67 y.o. female with a PMHx of HTN, DM, HLD, and hypokalemia who presented to clinic today for the follow up visit.  Patient states that she was evaluated by her PCP on 06/06/13, and she was instructed to decrease HCTZ to 12.5 mg daily, decrease Lisinopril to 10 mg po daily, and take KCL 40 Meg daily for 2 weeks. The plan is to check aldosterone, renin and BMP level two weeks after above changes.   Patient states that she has had a difficult time to cut her original HCTZ pill to half, and she is unable to get her potassium pills from her pharmacy. She is doing well otherwise.    Review of Systems: Per HPI.   Current Outpatient Medications: Current Outpatient Prescriptions  Medication Sig Dispense Refill  . amLODipine (NORVASC) 10 MG tablet Take 1 tablet (10 mg total) by mouth daily.  30 tablet  5  . aspirin EC 81 MG tablet Take 1 tablet (81 mg total) by mouth daily.  150 tablet  3  . bifidobacterium infantis (ALIGN) capsule Take 1 capsule by mouth daily.        . Blood Glucose Monitoring Suppl (ACCU-CHEK NANO SMARTVIEW) W/DEVICE KIT 1 each by Does not apply route 1 day or 1 dose. Uses to check blood sugar once daily in the morning.  3 kit  3  . Dermatological Products, Misc. (GENADUR CO) Apply 1 Film topically every morning.      . fexofenadine (ALLEGRA) 180 MG tablet Take 180 mg by mouth daily.        Marland Kitchen glucose blood (ACCU-CHEK SMARTVIEW) test strip Use to check blood sugars 1 time daily. Dx Code: 250.00.  100 each  12  . Lancets 30G MISC Use to check blood sugar 1 time daily. Dx code: 250.00.  100 each  6  . lisinopril (PRINIVIL,ZESTRIL) 10 MG tablet Take 1 tablet (10 mg total) by mouth daily.  30 tablet  0  . Potassium Chloride ER 20 MEQ  TBCR Take 40 mEq by mouth once.  30 tablet  0  . simvastatin (ZOCOR) 20 MG tablet Take 1 tablet (20 mg total) by mouth daily.  90 tablet  3  . terbinafine (LAMISIL) 250 MG tablet Take 250 mg by mouth daily.      . hydrochlorothiazide (MICROZIDE) 12.5 MG capsule Take 12.5 mg by mouth daily.       No current facility-administered medications for this visit.    Allergies: Allergies  Allergen Reactions  . Pineapple     Itchy and swelling    Past Medical History  Diagnosis Date  . Hypertension   . Ventricular hypertrophy 05/2006    LVH on ECG (05/2006) - Likely 2/2 HTN.  Marland Kitchen Hypokalemia     recurrent  . Seasonal allergies   . Ventral hernia     Incisional ventral hernia. S/P repair by Dr. Hulen Skains  (08/2008)  . HLD (hyperlipidemia)   . Villous adenoma of colon     Hx of. S/P resection and partial colectomy with anastomosis by Dr. Hulen Skains. (08/2006)  . Diabetes mellitus     Diet-controlled. Not on antiglycemic medications.    Objective:    Physical Exam:  Filed Vitals:   06/20/13 0831  BP: 135/89  Pulse: 82  Temp: 97.9 F (36.6 C)  TempSrc: Oral  Height: 5\' 6"  (1.676 m)  Weight: 189 lb (85.73 kg)  SpO2: 97%    General: Vital signs reviewed and noted. Well-developed, well-nourished, in no acute distress; alert, appropriate and cooperative throughout examination.  Head: Normocephalic, atraumatic.  Lungs:  Normal respiratory effort. Clear to auscultation BL without crackles or wheezes.  Heart: RRR. S1 and S2 normal without gallop, rubs, murmur.  Abdomen:  BS normoactive. Soft, Nondistended, non-tender.  No masses or organomegaly.  Extremities:  B/L 1+  pretibial edema.   Assessment/ Plan:

## 2013-06-20 NOTE — Patient Instructions (Signed)
1. Will send the HCTZ 12.5 mg po daily 2. Will sen your potassium 40 MEG po daily x 2 weeks 3. Please come back to check your blood on 06/26/13. 4. Follow up in 2 weeks  5. Will order DEXA scan.   Hypertension As your heart beats, it forces blood through your arteries. This force is your blood pressure. If the pressure is too high, it is called hypertension (HTN) or high blood pressure. HTN is dangerous because you may have it and not know it. High blood pressure may mean that your heart has to work harder to pump blood. Your arteries may be narrow or stiff. The extra work puts you at risk for heart disease, stroke, and other problems.  Blood pressure consists of two numbers, a higher number over a lower, 110/72, for example. It is stated as "110 over 72." The ideal is below 120 for the top number (systolic) and under 80 for the bottom (diastolic). Write down your blood pressure today. You should pay close attention to your blood pressure if you have certain conditions such as:  Heart failure.  Prior heart attack.  Diabetes  Chronic kidney disease.  Prior stroke.  Multiple risk factors for heart disease. To see if you have HTN, your blood pressure should be measured while you are seated with your arm held at the level of the heart. It should be measured at least twice. A one-time elevated blood pressure reading (especially in the Emergency Department) does not mean that you need treatment. There may be conditions in which the blood pressure is different between your right and left arms. It is important to see your caregiver soon for a recheck. Most people have essential hypertension which means that there is not a specific cause. This type of high blood pressure may be lowered by changing lifestyle factors such as:  Stress.  Smoking.  Lack of exercise.  Excessive weight.  Drug/tobacco/alcohol use.  Eating less salt. Most people do not have symptoms from high blood pressure until it  has caused damage to the body. Effective treatment can often prevent, delay or reduce that damage. TREATMENT  When a cause has been identified, treatment for high blood pressure is directed at the cause. There are a large number of medications to treat HTN. These fall into several categories, and your caregiver will help you select the medicines that are best for you. Medications may have side effects. You should review side effects with your caregiver. If your blood pressure stays high after you have made lifestyle changes or started on medicines,   Your medication(s) may need to be changed.  Other problems may need to be addressed.  Be certain you understand your prescriptions, and know how and when to take your medicine.  Be sure to follow up with your caregiver within the time frame advised (usually within two weeks) to have your blood pressure rechecked and to review your medications.  If you are taking more than one medicine to lower your blood pressure, make sure you know how and at what times they should be taken. Taking two medicines at the same time can result in blood pressure that is too low. SEEK IMMEDIATE MEDICAL CARE IF:  You develop a severe headache, blurred or changing vision, or confusion.  You have unusual weakness or numbness, or a faint feeling.  You have severe chest or abdominal pain, vomiting, or breathing problems. MAKE SURE YOU:   Understand these instructions.  Will watch your condition.  Will get help right away if you are not doing well or get worse. Document Released: 07/06/2005 Document Revised: 09/28/2011 Document Reviewed: 02/24/2008 [facility] Patient Information 2014 [place].

## 2013-06-22 NOTE — Addendum Note (Signed)
Addended by: Gilles Chiquito B on: 06/22/2013 09:12 AM   Modules accepted: Level of Service

## 2013-06-22 NOTE — Progress Notes (Signed)
Case discussed with Dr. Li soon after the resident saw the patient. We reviewed the resident's history and exam and pertinent patient test results. I agree with the assessment, diagnosis, and plan of care documented in the resident's note. 

## 2013-06-26 ENCOUNTER — Other Ambulatory Visit (INDEPENDENT_AMBULATORY_CARE_PROVIDER_SITE_OTHER): Payer: Medicare PPO

## 2013-06-26 DIAGNOSIS — E876 Hypokalemia: Secondary | ICD-10-CM

## 2013-06-26 LAB — BASIC METABOLIC PANEL WITH GFR
BUN: 11 mg/dL (ref 6–23)
Chloride: 103 mEq/L (ref 96–112)
GFR, Est Non African American: 54 mL/min — ABNORMAL LOW
Potassium: 3.5 mEq/L (ref 3.5–5.3)

## 2013-06-27 NOTE — Addendum Note (Signed)
Addended byNicoletta Dress, [place]oylene Splinter on: 06/27/2013 02:15 PM   Modules accepted: Orders

## 2013-06-28 ENCOUNTER — Other Ambulatory Visit: Payer: Self-pay | Admitting: Internal Medicine

## 2013-06-28 DIAGNOSIS — M8580 Other specified disorders of bone density and structure, unspecified site: Secondary | ICD-10-CM | POA: Insufficient documentation

## 2013-06-28 DIAGNOSIS — Z1231 Encounter for screening mammogram for malignant neoplasm of breast: Secondary | ICD-10-CM

## 2013-07-04 ENCOUNTER — Ambulatory Visit (INDEPENDENT_AMBULATORY_CARE_PROVIDER_SITE_OTHER): Payer: Medicare PPO | Admitting: Internal Medicine

## 2013-07-04 VITALS — BP 132/90 | HR 86 | Temp 97.8°F | Ht 65.0 in | Wt 188.1 lb

## 2013-07-04 DIAGNOSIS — E876 Hypokalemia: Secondary | ICD-10-CM

## 2013-07-04 DIAGNOSIS — I10 Essential (primary) hypertension: Secondary | ICD-10-CM

## 2013-07-04 LAB — BASIC METABOLIC PANEL
CO2: 32 mEq/L (ref 19–32)
Calcium: 9.5 mg/dL (ref 8.4–10.5)
Potassium: 3.8 mEq/L (ref 3.5–5.3)
Sodium: 143 mEq/L (ref 135–145)

## 2013-07-04 NOTE — Progress Notes (Signed)
Case discussed with Dr. Li soon after the resident saw the patient. We reviewed the resident's history and exam and pertinent patient test results. I agree with the assessment, diagnosis, and plan of care documented in the resident's note. 

## 2013-07-04 NOTE — Assessment & Plan Note (Signed)
BP Readings from Last 3 Encounters:  07/04/13 132/90  06/20/13 135/89  06/06/13 131/84    Lab Results  Component Value Date   Gina Bailey 142 06/26/2013   K 3.5 06/26/2013   CREATININE 1.06 06/26/2013    Assessment: Blood pressure control:   fair controlled Progress toward BP goal:     Plan: Medications:  continue current medications Educational resources provided:   Self management tools provided:   Other plans:  1. Will continue current therapy for now. Will resume home dose once her hypokalemia evaluation is done.

## 2013-07-04 NOTE — Assessment & Plan Note (Signed)
Assessment: She is in the process of evaluation of hypokalemia. Reports medical compliance with her new changes. She finished potassium supplement.   Plan: - will check blood renin, aldosterone and BMP - continue current medical therapy.

## 2013-07-04 NOTE — Patient Instructions (Signed)
1. Will check your blood today 2. Will contact your PCP about your test results 3. Follow up with your PCP in 1-2 months.

## 2013-07-04 NOTE — Progress Notes (Signed)
Patient: Gina Bailey   MRN: [id number]  DOB: April 06, 1946  PCP: [name], MD   Subjective:    CC: Hypertension and Diabetes   HPI: Gina Bailey is a 67 y.o. female with a PMHx of HTN, DM, HLD, and hypokalemia who presented to clinic today for the follow up visit.  # Hypokalemia  Patient is in the process of evaluation of hypokalemia. Her HCTZ was decreased to 12.5 mg po daily and Lisinopril to 10 daily. She was given KCL tablet 40 daily x 2 weeks. She reports medical compliance. She is here for follow up.   # Hypertension    Her antihypertensive meds were decreased for evaluation of hypokalemia. She has not checked her BP at home. She is noted to be mildly hypertensive initially which was normalized during the repeat check.   Review of Systems: Per HPI.   Current Outpatient Medications: Current Outpatient Prescriptions  Medication Sig Dispense Refill  . amLODipine (NORVASC) 10 MG tablet Take 1 tablet (10 mg total) by mouth daily.  30 tablet  5  . aspirin EC 81 MG tablet Take 1 tablet (81 mg total) by mouth daily.  150 tablet  3  . bifidobacterium infantis (ALIGN) capsule Take 1 capsule by mouth daily.        . Blood Glucose Monitoring Suppl (ACCU-CHEK NANO SMARTVIEW) W/DEVICE KIT 1 each by Does not apply route 1 day or 1 dose. Uses to check blood sugar once daily in the morning.  3 kit  3  . Dermatological Products, Misc. (GENADUR CO) Apply 1 Film topically every morning.      . fexofenadine (ALLEGRA) 180 MG tablet Take 180 mg by mouth daily.        Marland Kitchen glucose blood (ACCU-CHEK SMARTVIEW) test strip Use to check blood sugars 1 time daily. Dx Code: 250.00.  100 each  12  . hydrochlorothiazide (MICROZIDE) 12.5 MG capsule Take 1 capsule (12.5 mg total) by mouth daily.  30 capsule  11  . Lancets 30G MISC Use to check blood sugar 1 time daily. Dx code: 250.00.  100 each  6  . lisinopril (PRINIVIL,ZESTRIL) 10 MG tablet Take 1 tablet (10 mg total) by mouth daily.  30 tablet  0  .  potassium chloride 20 MEQ TBCR Take 40 mEq by mouth daily.  28 tablet  0  . Potassium Chloride ER 20 MEQ TBCR Take 40 mEq by mouth once.  30 tablet  0  . simvastatin (ZOCOR) 20 MG tablet Take 1 tablet (20 mg total) by mouth daily.  90 tablet  3  . terbinafine (LAMISIL) 250 MG tablet Take 250 mg by mouth daily.       No current facility-administered medications for this visit.    Allergies: Allergies  Allergen Reactions  . Pineapple     Itchy and swelling    Past Medical History  Diagnosis Date  . Hypertension   . Ventricular hypertrophy 05/2006    LVH on ECG (05/2006) - Likely 2/2 HTN.  Marland Kitchen Hypokalemia     recurrent  . Seasonal allergies   . Ventral hernia     Incisional ventral hernia. S/P repair by Dr. Hulen Skains  (08/2008)  . HLD (hyperlipidemia)   . Villous adenoma of colon     Hx of. S/P resection and partial colectomy with anastomosis by Dr. Hulen Skains. (08/2006)  . Diabetes mellitus     Diet-controlled. Not on antiglycemic medications.    Objective:    Physical Exam: Filed Vitals:  07/04/13 0816 07/04/13 0846  BP: 143/89 132/90  Pulse: 86   Temp: 97.8 F (36.6 C)   TempSrc: Oral   Height: 5\' 5"  (1.651 m)   Weight: 188 lb 1.6 oz (85.322 kg)   SpO2: 95%     General: Vital signs reviewed and noted. Well-developed, well-nourished, in no acute distress; alert, appropriate and cooperative throughout examination.  Head: Normocephalic, atraumatic.  Lungs:  Normal respiratory effort. Clear to auscultation BL without crackles or wheezes.  Heart: RRR. S1 and S2 normal without gallop, rubs, murmur.  Abdomen:  BS normoactive. Soft, Nondistended, non-tender.  No masses or organomegaly.  Extremities: B/L 1+ pretibial edema.   Assessment/ Plan:

## 2013-07-11 LAB — ALDOSTERONE + RENIN ACTIVITY W/ RATIO
ALDO / PRA Ratio: 12.2 Ratio (ref 0.9–28.9)
Aldosterone: 11 ng/dL
PRA LC/MS/MS: 0.9 ng/mL/h (ref 0.25–5.82)

## 2013-07-24 ENCOUNTER — Other Ambulatory Visit: Payer: Self-pay | Admitting: Internal Medicine

## 2013-07-24 DIAGNOSIS — E876 Hypokalemia: Secondary | ICD-10-CM

## 2013-07-24 DIAGNOSIS — I10 Essential (primary) hypertension: Secondary | ICD-10-CM

## 2013-07-24 MED ORDER — POTASSIUM CHLORIDE ER 20 MEQ PO TBCR: 20.0000 meq | EXTENDED_RELEASE_TABLET | Freq: Every day | ORAL | Status: DC

## 2013-07-24 NOTE — Progress Notes (Signed)
Patient's recent test results for evaluation of possible hyperaldosteronism is negative (aldosterone level<15 and aldo/renin ratio<30). Her BP seems OK on the reduced doses of anti-hypertensive medications. She is currently on amlodipine 10 mg daily, hydrochlorothiazide 12.5 mg daily, and lisinopril 10 mg daily. Her BP on 07/04/13 was 132/90 mmHg. I will continue the current regimen for HTN. Her K level was 3.8 when she was on 40 mEq of KCl daily. I will decrease it to 20 mEq of KCl daily. I will see her on 08/21/13 for re-evaluation.   [name], MD PGY3, Internal Medicine Teaching Service Pager: [phone]

## 2013-08-09 ENCOUNTER — Ambulatory Visit
Admission: RE | Admit: 2013-08-09 | Discharge: 2013-08-09 | Disposition: A | Discharge status: Home / Self Care | Payer: Medicare PPO | Source: Ambulatory Visit | Attending: Internal Medicine | Admitting: Internal Medicine

## 2013-08-09 DIAGNOSIS — M8580 Other specified disorders of bone density and structure, unspecified site: Secondary | ICD-10-CM

## 2013-08-09 DIAGNOSIS — Z1231 Encounter for screening mammogram for malignant neoplasm of breast: Secondary | ICD-10-CM

## 2013-08-21 ENCOUNTER — Encounter: Payer: Self-pay | Admitting: Internal Medicine

## 2013-08-21 ENCOUNTER — Ambulatory Visit (INDEPENDENT_AMBULATORY_CARE_PROVIDER_SITE_OTHER): Payer: Medicare PPO | Admitting: Internal Medicine

## 2013-08-21 VITALS — BP 132/83 | HR 83 | Temp 98.4°F | Ht 65.0 in | Wt 186.4 lb

## 2013-08-21 DIAGNOSIS — E876 Hypokalemia: Secondary | ICD-10-CM

## 2013-08-21 DIAGNOSIS — I10 Essential (primary) hypertension: Secondary | ICD-10-CM

## 2013-08-21 DIAGNOSIS — E119 Type 2 diabetes mellitus without complications: Secondary | ICD-10-CM

## 2013-08-21 DIAGNOSIS — M810 Age-related osteoporosis without current pathological fracture: Secondary | ICD-10-CM

## 2013-08-21 LAB — BASIC METABOLIC PANEL WITH GFR
BUN: 10 mg/dL (ref 6–23)
CHLORIDE: 103 meq/L (ref 96–112)
CO2: 33 mEq/L — ABNORMAL HIGH (ref 19–32)
CREATININE: 1.01 mg/dL (ref 0.50–1.10)
Calcium: 9.2 mg/dL (ref 8.4–10.5)
GFR, Est African American: 66 mL/min
GFR, Est Non African American: 57 mL/min — ABNORMAL LOW
GLUCOSE: 130 mg/dL — AB (ref 70–99)
Potassium: 3.5 mEq/L (ref 3.5–5.3)
Sodium: 145 mEq/L (ref 135–145)

## 2013-08-21 LAB — GLUCOSE, CAPILLARY: Glucose-Capillary: 152 mg/dL — ABNORMAL HIGH (ref 70–99)

## 2013-08-21 LAB — POCT GLYCOSYLATED HEMOGLOBIN (HGB A1C): Hemoglobin A1C: 7.2

## 2013-08-21 MED ORDER — CALCIUM CARBONATE-VITAMIN D 500-200 MG-UNIT PO TABS: 1.0000 | ORAL_TABLET | Freq: Two times a day (BID) | ORAL | Status: DC

## 2013-08-21 NOTE — Patient Instructions (Signed)
1. .Your have done excellent job in doing daily exercise, I really appreciated. Please continue to do exercise.  2. Please take all medications as prescribed.  3. If you have worsening of your symptoms or new symptoms arise, please call the clinic [phone]), or go to the ER immediately if symptoms are severe.  You have done great job in taking all your medications. I appreciate it very much. Please continue doing that.

## 2013-08-21 NOTE — Assessment & Plan Note (Signed)
Chronic hypokalemia. Etiology was not clear. Her Ald/renin ratio was normal. HCTZ use may have partially contributed.  -will continue potassium chloride 20 mEq daily.  -check BMP today.

## 2013-08-21 NOTE — Assessment & Plan Note (Signed)
BP Readings from Last 3 Encounters:  08/21/13 132/83  07/04/13 132/90  06/20/13 135/89    Lab Results  Component Value Date   NA 143 07/04/2013   K 3.8 07/04/2013   CREATININE 1.00 07/04/2013    Assessment: Blood pressure control: controlled Progress toward BP goal:  at goal Comments:   Plan: Medications:  continue current medications Educational resources provided: brochure Self management tools provided:   Other plans: bp is 132/83 today. No chest pain, shortness of breath. Will continue current regimen.

## 2013-08-21 NOTE — Assessment & Plan Note (Addendum)
  Lab Results  Component Value Date   HGBA1C 7.2 08/21/2013   HGBA1C 7.6 04/24/2013   HGBA1C 7.5 01/24/2013     Assessment: Diabetes control: fair control Progress toward A1C goal:    Comments:   Plan: Medications:  continue current medications Home glucose monitoring: Frequency:   Timing:   Instruction/counseling given: discussed diet Educational resources provided: brochure Self management tools provided: copy of home glucose meter download Other plans: Diet-controlled. Patient is doing exercise, she walks at least 40 minutes daily. A1c 7.6-->7.2 today. No symptoms for hypoglycemia. Continue to observe patient.

## 2013-08-21 NOTE — Progress Notes (Addendum)
Patient ID: Gina Bailey, female   DOB: 03-29-1946, 68 y.o.   MRN: [id number] Subjective:   [name]   DOB: 1946/04/02 68 y.o.   MRN: [id number]  CC:   Follow up visit.         HPI:  Gina Bailey is a 68 y.o. lady with past medical history as outlined below, who presents for a followup visit today  1. Hypokalemia:  Patient has chronic hypokalemia. Etiology was not clear. Her Ald/renin ratio was normal. Currently she is taking HCTZ 12.5 mg daily, amlodipin 10 mg daily, and lisinopril 10 mg daily. She is also on potassium chloride 20 mEq daily. She reports medical compliance. bp is 143/89 today.  2. HTN: bp is 132/83 today. No chest pain, shortness of breath.   3.  DM-: Diet-controlled. Patient is doing exercise, she walks at least 40 minutes daily. A1c 7.6-->7.2 today. No symptoms for hypoglycemia  4. Result of DEXA: patient had normal DEXA on 08/09/13. Oscal 600/400 Bid is to be started.   ROS:  Denies fever, chills, fatigue, headaches,  cough, chest pain, SOB,  abdominal pain,diarrhea, constipation, dysuria, urgency, frequency, hematuria.   Past Medical History  Diagnosis Date  . Hypertension   . Ventricular hypertrophy 05/2006    LVH on ECG (05/2006) - Likely 2/2 HTN.  Marland Kitchen Hypokalemia     recurrent  . Seasonal allergies   . Ventral hernia     Incisional ventral hernia. S/P repair by Dr. Hulen Skains  (08/2008)  . HLD (hyperlipidemia)   . Villous adenoma of colon     Hx of. S/P resection and partial colectomy with anastomosis by Dr. Hulen Skains. (08/2006)  . Diabetes mellitus     Diet-controlled. Not on antiglycemic medications.   Current Outpatient Prescriptions  Medication Sig Dispense Refill  . amLODipine (NORVASC) 10 MG tablet Take 1 tablet (10 mg total) by mouth daily.  30 tablet  5  . aspirin EC 81 MG tablet Take 1 tablet (81 mg total) by mouth daily.  150 tablet  3  . bifidobacterium infantis (ALIGN) capsule Take 1 capsule by mouth daily.        . Blood  Glucose Monitoring Suppl (ACCU-CHEK NANO SMARTVIEW) W/DEVICE KIT 1 each by Does not apply route 1 day or 1 dose. Uses to check blood sugar once daily in the morning.  3 kit  3  . Dermatological Products, Misc. (GENADUR CO) Apply 1 Film topically every morning.      . fexofenadine (ALLEGRA) 180 MG tablet Take 180 mg by mouth daily.        Marland Kitchen glucose blood (ACCU-CHEK SMARTVIEW) test strip Use to check blood sugars 1 time daily. Dx Code: 250.00.  100 each  12  . hydrochlorothiazide (MICROZIDE) 12.5 MG capsule Take 1 capsule (12.5 mg total) by mouth daily.  30 capsule  11  . Lancets 30G MISC Use to check blood sugar 1 time daily. Dx code: 250.00.  100 each  6  . lisinopril (PRINIVIL,ZESTRIL) 10 MG tablet TAKE ONE TABLET BY MOUTH ONCE DAILY  30 tablet  5  . Potassium Chloride ER 20 MEQ TBCR Take 20 mEq by mouth daily.  30 tablet  0  . simvastatin (ZOCOR) 20 MG tablet Take 1 tablet (20 mg total) by mouth daily.  90 tablet  3  . terbinafine (LAMISIL) 250 MG tablet Take 250 mg by mouth daily.      . calcium-vitamin D (OSCAL 500/200 D-3) 500-200 MG-UNIT per tablet  Take 1 tablet by mouth 2 (two) times daily.  180 tablet  3   No current facility-administered medications for this visit.   Family History  Problem Relation Age of Onset  . Diabetes      1st degree reative in 3 of her siblings   History   Social History  . Marital Status: Widowed    Spouse Name: N/A    Number of Children: N/A  . Years of Education: N/A   Occupational History  . [employer]  . Merchant [employer] officer   Social History Main Topics  . Smoking status: Former Smoker -- 0.05 packs/day for 6 years    Types: Cigarettes    Quit date: 09/05/1999  . Smokeless tobacco: Never Used  . Alcohol Use: No  . Drug Use: No  . Sexual Activity: Not Currently   Other Topics Concern  . None   Social History Narrative   Retired from working as a [employer].   Widowed. (since  1997). Not sexually active since.   Volunteers at [employer].    Review of Systems: Full 14-point review of systems otherwise negative. See HPI.   Objective:  Physical Exam: Filed Vitals:   08/21/13 0821 08/21/13 0857  BP: 136/86 132/83  Pulse: 89 83  Temp: 98.4 F (36.9 C)   TempSrc: Oral   Height: _0  (1.651 m)   Weight: 186 lb 6.4 oz (84.55 kg)   SpO2: 99%   General: resting in bed, not in acute distress   HEENT: PERRL, EOMI, no scleral icterus   Cardiac: S1/S2, RRR, No murmurs, gallops or rubs   Pulm: Good air movement bilaterally, Clear to auscultation bilaterally, No rales, wheezing, rhonchi or rubs.   Abd: Soft, nondistended, nontender, no rebound pain, no organomegaly, BS present. I could not palpate any enlarged lymph nodes over the inguinal area on both sides. There is no enlarged  nodes in the axillary areas bilaterally. Ext: Has 1+ pitting edema in the right lower leg which has not changed.  2+DP/PT pulse bilaterally. There is no local redness or tenderness.   Neuro: alert and oriented X3, cranial nerves II-XII grossly intact, muscle strength 5/5 in all extremeties, sensation to light touch intact.      Assessment & Plan:

## 2013-08-22 ENCOUNTER — Other Ambulatory Visit: Payer: Self-pay | Admitting: Internal Medicine

## 2013-08-22 DIAGNOSIS — Z780 Asymptomatic menopausal state: Secondary | ICD-10-CM

## 2013-08-22 MED ORDER — CALCIUM CARBONATE-VITAMIN D 600-400 MG-UNIT PO TABS: 1.0000 | ORAL_TABLET | Freq: Two times a day (BID) | ORAL | Status: DC

## 2013-08-22 NOTE — Progress Notes (Signed)
Case discussed with Dr. Niu at time of visit.  We reviewed the resident's history and exam and pertinent patient test results.  I agree with the assessment, diagnosis, and plan of care documented in the resident's note. 

## 2013-08-23 ENCOUNTER — Telehealth: Payer: Self-pay | Admitting: Internal Medicine

## 2013-08-23 NOTE — Telephone Encounter (Signed)
Called Dr. Zenia Resides office.  Patient kept appointment on 08/14/2013.  Records being faxed.

## 2013-11-21 ENCOUNTER — Encounter: Payer: Self-pay | Admitting: Internal Medicine

## 2013-11-21 ENCOUNTER — Ambulatory Visit (INDEPENDENT_AMBULATORY_CARE_PROVIDER_SITE_OTHER): Payer: Medicare PPO | Admitting: Internal Medicine

## 2013-11-21 VITALS — BP 136/80 | HR 90 | Temp 98.0°F | Ht 65.0 in | Wt 179.3 lb

## 2013-11-21 DIAGNOSIS — E782 Mixed hyperlipidemia: Secondary | ICD-10-CM

## 2013-11-21 DIAGNOSIS — E119 Type 2 diabetes mellitus without complications: Secondary | ICD-10-CM

## 2013-11-21 DIAGNOSIS — E876 Hypokalemia: Secondary | ICD-10-CM

## 2013-11-21 DIAGNOSIS — I10 Essential (primary) hypertension: Secondary | ICD-10-CM

## 2013-11-21 LAB — BASIC METABOLIC PANEL WITH GFR
BUN: 16 mg/dL (ref 6–23)
CO2: 33 mEq/L — ABNORMAL HIGH (ref 19–32)
Calcium: 8.9 mg/dL (ref 8.4–10.5)
Chloride: 101 mEq/L (ref 96–112)
Creat: 1.15 mg/dL — ABNORMAL HIGH (ref 0.50–1.10)
GFR, EST NON AFRICAN AMERICAN: 49 mL/min — AB
GFR, Est African American: 56 mL/min — ABNORMAL LOW
GLUCOSE: 84 mg/dL (ref 70–99)
POTASSIUM: 3 meq/L — AB (ref 3.5–5.3)
Sodium: 143 mEq/L (ref 135–145)

## 2013-11-21 LAB — POCT GLYCOSYLATED HEMOGLOBIN (HGB A1C): HEMOGLOBIN A1C: 6.9

## 2013-11-21 LAB — GLUCOSE, CAPILLARY: Glucose-Capillary: 89 mg/dL (ref 70–99)

## 2013-11-21 NOTE — Patient Instructions (Signed)
1. You have done great job in taking all your medications. I appreciate it very much. Please continue doing that. 2. Please take all medications as prescribed.  3. If you have worsening of your symptoms or new symptoms arise, please call the clinic [phone]), or go to the ER immediately if symptoms are severe.  Please bring in all your medication bottles with you in next visit.

## 2013-11-21 NOTE — Assessment & Plan Note (Signed)
The patient is taking Zocor 20 mg daily. She has not noticed any side effects, such as muscle pain. Her AST and ALT were normal on 01/24/13. Will continue current regimen.

## 2013-11-21 NOTE — Progress Notes (Addendum)
Patient ID: Gina Bailey, female   DOB: 1946-04-30, 68 y.o.   MRN: [id number] Subjective:   [name]   DOB: 13-Jun-1946 68 y.o.   MRN: [id number]  CC:   Follow up visit.          HPI:  Gina Bailey is a 68 y.o. man lady with past medical history as outlined below, who presents for a followup visit today   Gina Bailey is a 68 y.o. lady with past medical history as outlined below, who presents for a followup visit today  1. HLD: Patient is taking Zocor 20 mg daily. She has not noticed any side effects, such as muscle pain. Her AST and ALT were normal on 01/24/13.  2. HTN: bp is 136/80 mmHg today. No chest pain, shortness of breath.   3. DM-: Diet-controlled. Patient is doing exercise regularly, she walks at least 30 minutes daily. A1c 7.6-->7.2--> 6.9 today. No symptoms for hypoglycemia  ROS:  Denies fever, chills, fatigue, headaches,  cough, chest pain, SOB,  abdominal pain,diarrhea, constipation, dysuria, urgency, frequency, hematuria.    Past Medical History  Diagnosis Date  . Hypertension   . Ventricular hypertrophy 05/2006    LVH on ECG (05/2006) - Likely 2/2 HTN.  Marland Kitchen Hypokalemia     recurrent  . Seasonal allergies   . Ventral hernia     Incisional ventral hernia. S/P repair by Dr. Hulen Skains  (08/2008)  . HLD (hyperlipidemia)   . Villous adenoma of colon     Hx of. S/P resection and partial colectomy with anastomosis by Dr. Hulen Skains. (08/2006)  . Diabetes mellitus     Diet-controlled. Not on antiglycemic medications.   Current Outpatient Prescriptions  Medication Sig Dispense Refill  . amLODipine (NORVASC) 10 MG tablet Take 1 tablet (10 mg total) by mouth daily.  30 tablet  5  . aspirin EC 81 MG tablet Take 1 tablet (81 mg total) by mouth daily.  150 tablet  3  . bifidobacterium infantis (ALIGN) capsule Take 1 capsule by mouth daily.        . Blood Glucose Monitoring Suppl (ACCU-CHEK NANO SMARTVIEW) W/DEVICE KIT 1 each by Does not apply route 1  day or 1 dose. Uses to check blood sugar once daily in the morning.  3 kit  3  . Calcium Carbonate-Vitamin D 600-400 MG-UNIT per tablet Take 1 tablet by mouth 2 (two) times daily.  60 tablet  5  . Dermatological Products, Misc. (GENADUR CO) Apply 1 Film topically every morning.      . fexofenadine (ALLEGRA) 180 MG tablet Take 180 mg by mouth daily.        Marland Kitchen glucose blood (ACCU-CHEK SMARTVIEW) test strip Use to check blood sugars 1 time daily. Dx Code: 250.00.  100 each  12  . hydrochlorothiazide (MICROZIDE) 12.5 MG capsule Take 1 capsule (12.5 mg total) by mouth daily.  30 capsule  11  . Lancets 30G MISC Use to check blood sugar 1 time daily. Dx code: 250.00.  100 each  6  . lisinopril (PRINIVIL,ZESTRIL) 10 MG tablet TAKE ONE TABLET BY MOUTH ONCE DAILY  30 tablet  5  . Potassium Chloride ER 20 MEQ TBCR Take 20 mEq by mouth daily.  30 tablet  0  . simvastatin (ZOCOR) 20 MG tablet Take 1 tablet (20 mg total) by mouth daily.  90 tablet  3  . terbinafine (LAMISIL) 250 MG tablet Take 250 mg by mouth daily.  No current facility-administered medications for this visit.   Family History  Problem Relation Age of Onset  . Diabetes      1st degree reative in 3 of her siblings   History   Social History  . Marital Status: Widowed    Spouse Name: N/A    Number of Children: N/A  . Years of Education: N/A   Occupational History  . [place]  . Merchant navy officer   Social History Main Topics  . Smoking status: Former Smoker -- 0.05 packs/day for 6 years    Types: Cigarettes    Quit date: 09/05/1999  . Smokeless tobacco: Never Used  . Alcohol Use: No  . Drug Use: No  . Sexual Activity: Not Currently   Other Topics Concern  . Not on file   Social History Narrative   Retired from working as a [employer].   Widowed. (since 1997). Not sexually active since.   Volunteers at [employer].    Review of Systems: Full 14-point  review of systems otherwise negative. See HPI.   Objective:  Physical Exam: There were no vitals filed for this visit.  General: resting in bed, not in acute distress   HEENT: PERRL, EOMI, no scleral icterus   Cardiac: S1/S2, RRR, No murmurs, gallops or rubs   Pulm: Good air movement bilaterally, Clear to auscultation bilaterally, No rales, wheezing, rhonchi or rubs.   Abd: Soft, nondistended, nontender, no rebound pain, no organomegaly, BS present. I could not palpate any enlarged lymph nodes over the inguinal area on both sides. There is no enlarged  nodes in the axillary areas bilaterally. Ext: trace leg edema in the right lower leg.  2+DP/PT pulse bilaterally. There is no local redness or tenderness.  Neuro: alert and oriented X3, cranial nerves II-XII grossly intact, muscle strength 5/5 in all extremeties, sensation to light touch intact.      Assessment & Plan:   Addendum 11/22/13  Patient's K=3.0. She was supposed to take KCl 20 mEq daily, but was not complaint to this medication.  Will restart KCl 20 mEq daily. Prescription is sent to her pharmacy.  Will call patient to let her pick up the prescription.   [name], MD PGY3, Internal Medicine Teaching Service Pager: [phone]

## 2013-11-21 NOTE — Assessment & Plan Note (Signed)
Lab Results  Component Value Date   HGBA1C 6.9 11/21/2013   HGBA1C 7.2 08/21/2013   HGBA1C 7.6 04/24/2013     Assessment: Diabetes control: good control (HgbA1C at goal) Progress toward A1C goal:  at goal Comments:   Plan: Medications:  continue current medications Home glucose monitoring: Frequency:   Timing:   Instruction/counseling given: discussed foot care Educational resources provided: brochure Self management tools provided:   Other plans: Her diabetes mellitus is diet controlled. Patient is doing regular exercise. A1c is 6.9 today. will continue to observe patient on diet

## 2013-11-21 NOTE — Assessment & Plan Note (Signed)
BP Readings from Last 3 Encounters:  11/21/13 136/80  08/21/13 132/83  07/04/13 132/90    Lab Results  Component Value Date   NA 145 08/21/2013   K 3.5 08/21/2013   CREATININE 1.01 08/21/2013    Assessment: Blood pressure control: controlled Progress toward BP goal:  at goal Comments:   Plan: Medications:  continue current medications Educational resources provided: brochure Self management tools provided:   Other plans: Blood pressure is well controlled. Will continue current regimen, including hydrochlorothiazide 12.5 mg daily and lisinopril 10 mg daily. Will check BMP today.

## 2013-11-22 ENCOUNTER — Encounter: Payer: Medicare PPO | Admitting: Internal Medicine

## 2013-11-22 MED ORDER — POTASSIUM CHLORIDE ER 20 MEQ PO TBCR: 20.0000 meq | EXTENDED_RELEASE_TABLET | Freq: Every day | ORAL | Status: DC

## 2013-11-22 NOTE — Addendum Note (Signed)
Addended by: [name] on: 11/22/2013 11:54 AM   Modules accepted: Orders

## 2013-11-23 NOTE — Progress Notes (Signed)
Case discussed with Dr. Blaine Hamper at the time of the visit.  We reviewed the resident's history and exam and pertinent patient test results.  I agree with the assessment, diagnosis, and plan of care documented in the resident's note.

## 2013-12-20 ENCOUNTER — Other Ambulatory Visit: Payer: Self-pay | Admitting: *Deleted

## 2013-12-20 DIAGNOSIS — I10 Essential (primary) hypertension: Secondary | ICD-10-CM

## 2013-12-20 DIAGNOSIS — E785 Hyperlipidemia, unspecified: Secondary | ICD-10-CM

## 2013-12-20 MED ORDER — SIMVASTATIN 20 MG PO TABS: 20.0000 mg | ORAL_TABLET | Freq: Every day | ORAL | Status: DC

## 2013-12-20 MED ORDER — LISINOPRIL 10 MG PO TABS: ORAL_TABLET | ORAL | Status: DC

## 2013-12-20 MED ORDER — AMLODIPINE BESYLATE 10 MG PO TABS: 10.0000 mg | ORAL_TABLET | Freq: Every day | ORAL | Status: DC

## 2014-05-14 ENCOUNTER — Other Ambulatory Visit: Payer: Self-pay | Admitting: Internal Medicine

## 2014-05-14 NOTE — Telephone Encounter (Signed)
Patient needs clinic appointment

## 2014-05-15 ENCOUNTER — Other Ambulatory Visit: Payer: Self-pay | Admitting: *Deleted

## 2014-05-15 MED ORDER — AMLODIPINE BESYLATE 10 MG PO TABS: 10.0000 mg | ORAL_TABLET | Freq: Every day | ORAL | Status: DC

## 2014-05-22 ENCOUNTER — Ambulatory Visit (INDEPENDENT_AMBULATORY_CARE_PROVIDER_SITE_OTHER): Payer: Medicare PPO | Admitting: *Deleted

## 2014-05-22 ENCOUNTER — Encounter: Payer: Self-pay | Admitting: Internal Medicine

## 2014-05-22 ENCOUNTER — Ambulatory Visit (INDEPENDENT_AMBULATORY_CARE_PROVIDER_SITE_OTHER): Payer: Medicare PPO | Admitting: Internal Medicine

## 2014-05-22 VITALS — BP 140/80 | HR 90 | Temp 98.7°F | Ht 65.0 in | Wt 182.2 lb

## 2014-05-22 DIAGNOSIS — E119 Type 2 diabetes mellitus without complications: Secondary | ICD-10-CM

## 2014-05-22 DIAGNOSIS — H9201 Otalgia, right ear: Secondary | ICD-10-CM | POA: Insufficient documentation

## 2014-05-22 DIAGNOSIS — I10 Essential (primary) hypertension: Secondary | ICD-10-CM

## 2014-05-22 DIAGNOSIS — E785 Hyperlipidemia, unspecified: Secondary | ICD-10-CM

## 2014-05-22 DIAGNOSIS — Z23 Encounter for immunization: Secondary | ICD-10-CM

## 2014-05-22 DIAGNOSIS — E782 Mixed hyperlipidemia: Secondary | ICD-10-CM

## 2014-05-22 LAB — GLUCOSE, CAPILLARY: GLUCOSE-CAPILLARY: 90 mg/dL (ref 70–99)

## 2014-05-22 LAB — POCT GLYCOSYLATED HEMOGLOBIN (HGB A1C): Hemoglobin A1C: 7

## 2014-05-22 MED ORDER — ANTIPYRINE-BENZOCAINE 5.4-1.4 % OT SOLN: 3.0000 [drp] | OTIC | Status: DC | PRN

## 2014-05-22 MED ORDER — GLUCOSE BLOOD VI STRP: ORAL_STRIP | Status: DC

## 2014-05-22 NOTE — Patient Instructions (Signed)
It was a pleasure meeting you today, Gina Bailey.  Please take ear drops for your right ear. These drops should help with the pain.  You are getting blood work today. I will call you with your results.  General Instructions:   Thank you for bringing your medicines today. This helps [place] keep you safe from mistakes.   Progress Toward Treatment Goals:  Treatment Goal 11/21/2013  Hemoglobin A1C at goal  Blood pressure at goal    Self Care Goals & Plans:  Self Care Goal 08/21/2013  Manage my medications take my medicines as prescribed; bring my medications to every visit; refill my medications on time  Monitor my health keep track of my blood glucose; bring my glucose meter and log to each visit  Eat healthy foods -  Be physically active -    Home Blood Glucose Monitoring 04/24/2013  Check my blood sugar once a day  When to check my blood sugar before meals     Care Management & Community Referrals:  Referral 04/24/2013  Referrals made for care management support none needed

## 2014-05-22 NOTE — Progress Notes (Signed)
   Subjective:    Patient ID: Gina Bailey, female    DOB: March 18, 1946, 68 y.o.   MRN: [id number]  HPI Gina Bailey is a 68yo woman w/ PMHx of HTN, HLD, and Type 2 DM who presents today for the following:  1. Type 2 DM: Last HbA1c 6.9 on 11/21/13. Patient is diet controlled. She denies polyuria, polydipsia, polyphagia, and blurry vision.   2. HTN: BP today 156/91. Her repeat BP was 140/80. Patient reports good compliance with her Amlodipine, Lisinopril, and HCTZ.   3. HLD: Last lipid profile on 03/14/13 showed Chol 160, Trigly 73, HDL 62, and LDL 83. She takes Simvastatin 20 mg daily.   4. Right Ear Pain: Patient states she has been having right ear pain intermittently for the past few weeks. She reports she has had ear wax build up in the past and attributes her pain to that. She uses a washcloth with warm water to clean her ears. She denies using pointed objects to clean her ears. She denies difficulty or pain with chewing and discharge from her ear.    Review of Systems General: Denies fever, chills, night sweats, changes in weight, changes in appetite HEENT: Denies headaches, changes in vision, rhinorrhea, sore throat CV: Denies CP, palpitations, SOB, orthopnea Pulm: Denies SOB, cough, wheezing GI: Denies abdominal pain, nausea, vomiting, diarrhea, constipation, melena, hematochezia GU: Denies dysuria, hematuria Msk: Denies muscle cramps, joint pains Neuro: Denies weakness, numbness, tingling Skin: Denies rashes, bruising    Objective:   Physical Exam General: alert, sitting up in chair, NAD HEENT: [place]/AT, EOMI, PERRL, sclera anicteric, right ear canal has mild erythema, left ear appears normal, pharynx non-erythematous, mucus membranes moist Neck: supple, no JVD, no lymphadenopathy CV: RRR, normal S1/S2, no m/g/r Pulm: CTA bilaterally, breaths non-labored, no wheezing Abd: BS+, soft, non-distended, non-tender Ext: warm, no edema, moves all Neuro: alert and oriented x 3, CNs II-XII  intact, strength 5/5 in upper and lower extremities bilaterally       Assessment & Plan:

## 2014-05-22 NOTE — Assessment & Plan Note (Signed)
BP Readings from Last 3 Encounters:  05/22/14 140/80  11/21/13 136/80  08/21/13 132/83    Lab Results  Component Value Date   NA 143 11/21/2013   K 3.0* 11/21/2013   CREATININE 1.15* 11/21/2013    Assessment: Blood pressure control: mildly elevated Progress toward BP goal:  deteriorated Comments:   Plan: Medications:  continue current medications Educational resources provided:   Self management tools provided:   Other plans: Continue current regimen of Amlodipine 10 mg daily, HCTZ 12.5 mg daily, and Lisinopril 10 mg daily.

## 2014-05-22 NOTE — Assessment & Plan Note (Addendum)
Lab Results  Component Value Date   HGBA1C 6.9 11/21/2013   HGBA1C 7.2 08/21/2013   HGBA1C 7.6 04/24/2013     Assessment: Diabetes control: good control (HgbA1C at goal) Progress toward A1C goal:  unable to assess Comments:   Plan: Medications:  continue current medications Home glucose monitoring: Frequency:   Timing:   Instruction/counseling given: reminded to bring medications to each visit and discussed foot care Educational resources provided:   Self management tools provided: copy of home glucose meter download, home glucose testing supplies Other plans:  - Check HbA1c today - Foot exam today - If HbA1c still 7 or less will continue diet/exercise management

## 2014-05-22 NOTE — Assessment & Plan Note (Signed)
-   Will check lipid profile today - Continue Simvastatin 20 mg daily

## 2014-05-22 NOTE — Assessment & Plan Note (Signed)
Patient complains of intermittent right ear pain for last few weeks. Right ear canal appeared erythematous.  - Prescribed Auralgain otic solution

## 2014-05-23 LAB — LIPID PANEL
Cholesterol: 144 mg/dL (ref 0–200)
HDL: 62 mg/dL (ref >39)
LDL Cholesterol: 70 mg/dL (ref 0–99)
TRIGLYCERIDES: 59 mg/dL (ref <150)
Total CHOL/HDL Ratio: 2.3 Ratio
VLDL: 12 mg/dL (ref 0–40)

## 2014-05-23 LAB — BASIC METABOLIC PANEL WITH GFR
BUN: 10 mg/dL (ref 6–23)
CALCIUM: 9.6 mg/dL (ref 8.4–10.5)
CO2: 30 meq/L (ref 19–32)
CREATININE: 0.95 mg/dL (ref 0.50–1.10)
Chloride: 101 mEq/L (ref 96–112)
GFR, EST AFRICAN AMERICAN: 70 mL/min
GFR, Est Non African American: 61 mL/min
Glucose, Bld: 90 mg/dL (ref 70–99)
Potassium: 3.2 mEq/L — ABNORMAL LOW (ref 3.5–5.3)
Sodium: 142 mEq/L (ref 135–145)

## 2014-05-24 NOTE — Addendum Note (Signed)
Addended by: Gilles Chiquito B on: 05/24/2014 07:42 PM   Modules accepted: Level of Service

## 2014-05-27 NOTE — Progress Notes (Signed)
Internal Medicine Clinic Attending  I saw and evaluated the patient.  I personally confirmed the key portions of the history and exam documented by Dr. Arcelia Jew and I reviewed pertinent patient test results.  The assessment, diagnosis, and plan were formulated together and I agree with the documentation in the resident's note.

## 2014-05-29 ENCOUNTER — Telehealth: Payer: Self-pay | Admitting: Internal Medicine

## 2014-05-29 DIAGNOSIS — E876 Hypokalemia: Secondary | ICD-10-CM

## 2014-05-29 MED ORDER — POTASSIUM CHLORIDE ER 20 MEQ PO TBCR: 20.0000 meq | EXTENDED_RELEASE_TABLET | Freq: Every day | ORAL | Status: DC

## 2014-05-29 NOTE — Telephone Encounter (Signed)
Patient made aware of lab results. Will put in prescription for potassium chloride to [facility].

## 2014-06-11 ENCOUNTER — Other Ambulatory Visit: Payer: Self-pay | Admitting: *Deleted

## 2014-06-12 MED ORDER — LISINOPRIL 10 MG PO TABS: 10.0000 mg | ORAL_TABLET | Freq: Every day | ORAL | Status: DC

## 2014-06-12 NOTE — Telephone Encounter (Signed)
Needs May appt PCP

## 2014-06-13 NOTE — Telephone Encounter (Signed)
Message sent to front desk pool about appt per Dr Lynnae January.

## 2014-07-09 ENCOUNTER — Other Ambulatory Visit: Payer: Self-pay

## 2014-07-09 DIAGNOSIS — Z1231 Encounter for screening mammogram for malignant neoplasm of breast: Secondary | ICD-10-CM

## 2014-07-23 ENCOUNTER — Telehealth: Payer: Self-pay | Admitting: *Deleted

## 2014-07-23 DIAGNOSIS — I10 Essential (primary) hypertension: Secondary | ICD-10-CM

## 2014-07-23 NOTE — Telephone Encounter (Signed)
Fax from [facility] - need refill on Microzide 12.5 mg  Take 1 cap by mouth once daily. Not on current med list. Last refilled 06/17/14. Qty # 30.

## 2014-07-24 MED ORDER — HYDROCHLOROTHIAZIDE 12.5 MG PO CAPS: 12.5000 mg | ORAL_CAPSULE | Freq: Every day | ORAL | Status: DC

## 2014-07-24 NOTE — Telephone Encounter (Signed)
Patient requesting HCTZ 12.5 mg daily. This has fell off her med list for some reason. Per my last note on 05/22/14 she is still on this medication. Will refill.

## 2014-08-10 ENCOUNTER — Ambulatory Visit: Payer: Medicare PPO

## 2014-08-14 ENCOUNTER — Ambulatory Visit
Admission: RE | Admit: 2014-08-14 | Discharge: 2014-08-14 | Disposition: A | Discharge status: Home / Self Care | Payer: Medicare PPO | Source: Ambulatory Visit

## 2014-08-14 DIAGNOSIS — Z1231 Encounter for screening mammogram for malignant neoplasm of breast: Secondary | ICD-10-CM

## 2014-11-01 DIAGNOSIS — H2513 Age-related nuclear cataract, bilateral: Secondary | ICD-10-CM | POA: Diagnosis not present

## 2014-11-01 DIAGNOSIS — E119 Type 2 diabetes mellitus without complications: Secondary | ICD-10-CM | POA: Diagnosis not present

## 2014-11-20 ENCOUNTER — Encounter: Payer: Self-pay | Admitting: Internal Medicine

## 2014-11-20 ENCOUNTER — Ambulatory Visit (INDEPENDENT_AMBULATORY_CARE_PROVIDER_SITE_OTHER): Payer: Medicare PPO | Admitting: Internal Medicine

## 2014-11-20 VITALS — BP 149/93 | HR 104 | Temp 98.9°F | Ht 65.0 in | Wt 184.1 lb

## 2014-11-20 DIAGNOSIS — R911 Solitary pulmonary nodule: Secondary | ICD-10-CM

## 2014-11-20 DIAGNOSIS — M8580 Other specified disorders of bone density and structure, unspecified site: Secondary | ICD-10-CM | POA: Diagnosis not present

## 2014-11-20 DIAGNOSIS — I10 Essential (primary) hypertension: Secondary | ICD-10-CM | POA: Diagnosis not present

## 2014-11-20 DIAGNOSIS — E119 Type 2 diabetes mellitus without complications: Secondary | ICD-10-CM

## 2014-11-20 DIAGNOSIS — E785 Hyperlipidemia, unspecified: Secondary | ICD-10-CM | POA: Diagnosis not present

## 2014-11-20 DIAGNOSIS — E782 Mixed hyperlipidemia: Secondary | ICD-10-CM

## 2014-11-20 LAB — BASIC METABOLIC PANEL WITH GFR
BUN: 14 mg/dL (ref 6–23)
CO2: 31 meq/L (ref 19–32)
Calcium: 9.3 mg/dL (ref 8.4–10.5)
Chloride: 102 mEq/L (ref 96–112)
Creat: 1.09 mg/dL (ref 0.50–1.10)
GFR, EST AFRICAN AMERICAN: 59 mL/min — AB
GFR, Est Non African American: 52 mL/min — ABNORMAL LOW
Glucose, Bld: 94 mg/dL (ref 70–99)
Potassium: 3.2 mEq/L — ABNORMAL LOW (ref 3.5–5.3)
SODIUM: 143 meq/L (ref 135–145)

## 2014-11-20 LAB — POCT GLYCOSYLATED HEMOGLOBIN (HGB A1C): Hemoglobin A1C: 7.6

## 2014-11-20 LAB — GLUCOSE, CAPILLARY: Glucose-Capillary: 91 mg/dL (ref 70–99)

## 2014-11-20 NOTE — Assessment & Plan Note (Signed)
Patient has not had repeat CT chest to follow up her pulmonary nodule. Denies any upper respiratory symptoms including dyspnea, hemoptysis, cough, and wheezing. She reports she smoked cigarettes briefly when she was younger but this many years ago.  - Repeat CT Chest ordered

## 2014-11-20 NOTE — Progress Notes (Signed)
   Subjective:    Patient ID: Gina Bailey, female    DOB: 1945-10-16, 69 y.o.   MRN: [id number]  HPI Ms. Brinley is a 69yo woman with PMHx of HTN, Type 2 DM, hyperlipidemia, osteopenia, and a solitary pulmonary nodule who presents today for a routine visit.   Please refer to problem based documentation.    Review of Systems General: Denies fever, chills, night sweats, changes in weight, changes in appetite HEENT: Denies headaches, ear pain, changes in vision, rhinorrhea, sore throat CV: Denies CP, palpitations, SOB, orthopnea Pulm: Denies SOB, cough, wheezing GI: Denies abdominal pain, nausea, vomiting, diarrhea, constipation, melena, hematochezia GU: Denies dysuria, hematuria, frequency Msk: Denies muscle cramps, joint pains Neuro: Denies weakness, numbness, tingling Skin: Denies rashes, bruising    Objective:   Physical Exam General: sitting up in chair, NAD HEENT: [place]/AT, EOMI, sclera anicteric, mucus membranes moist CV: RRR, no m/g/r Pulm: CTA bilaterally, breaths non-labored Abd: BS+, soft, non-tender, non-distended Ext: warm, no edema, moves all Neuro: alert and oriented x 3, no focal deficits     Assessment & Plan:  Please refer to problem based documentation.

## 2014-11-20 NOTE — Assessment & Plan Note (Signed)
Continue calcium and vitamin D supplementation.  

## 2014-11-20 NOTE — Progress Notes (Signed)
INTERNAL MEDICINE TEACHING ATTENDING ADDENDUM - Mardelle Pandolfi, MD: I reviewed and discussed at the time of visit with the resident Dr. Rivet, the patient's medical history, physical examination, diagnosis and results of pertinent tests and treatment and I agree with the patient's care as documented.  

## 2014-11-20 NOTE — Patient Instructions (Signed)
It was a pleasure seeing you today, Gina Bailey.  - We will schedule your CT Chest scan for a day that works for you - Follow up appointment in 3-6 months, depending upon HbA1c level  General Instructions:   Thank you for bringing your medicines today. This helps [place] keep you safe from mistakes.   Progress Toward Treatment Goals:  Treatment Goal 05/22/2014  Hemoglobin A1C unable to assess  Blood pressure deteriorated    Self Care Goals & Plans:  Self Care Goal 05/22/2014  Manage my medications take my medicines as prescribed; bring my medications to every visit  Monitor my health -  Eat healthy foods -  Be physically active -    Home Blood Glucose Monitoring 04/24/2013  Check my blood sugar once a day  When to check my blood sugar before meals     Care Management & Community Referrals:  Referral 04/24/2013  Referrals made for care management support none needed

## 2014-11-20 NOTE — Assessment & Plan Note (Signed)
Continue simvastatin 20 mg daily 

## 2014-11-20 NOTE — Assessment & Plan Note (Signed)
Lab Results  Component Value Date   HGBA1C 7.0 05/22/2014   HGBA1C 6.9 11/21/2013   HGBA1C 7.2 08/21/2013     Assessment: Diabetes control: good control (HgbA1C at goal) Progress toward A1C goal:  at goal Comments: Well controlled by diet/exercise.   Plan: Medications:  Diet/exercise controlled Home glucose monitoring: Frequency: 2 times a day Timing: before meals Instruction/counseling given: reminded to bring blood glucose meter & log to each visit, reminded to bring medications to each visit and discussed diet Self management tools provided: copy of home glucose meter download Other plans:  - Patient had ophtho exam earlier this month, f/u report from Dr. Zenia Resides office - HbA1c today

## 2014-11-20 NOTE — Assessment & Plan Note (Signed)
BP Readings from Last 3 Encounters:  11/20/14 149/93  05/22/14 140/80  11/21/13 136/80    Lab Results  Component Value Date   NA 142 05/22/2014   K 3.2* 05/22/2014   CREATININE 0.95 05/22/2014    Assessment: Blood pressure control: controlled Progress toward BP goal:  at goal Comments: BP slightly elevated compared to baseline. Repeat BP 140/80.   Plan: Medications:  Continue Amlodipine 10 mg daily, HCTZ 12.5 mg daily, and Lisinopril 10 mg daily  Other plans:  - BP recheck at next visit - Consider increasing HCTZ or Lisinopril if BP elevated at next visit - bmet today

## 2015-01-07 ENCOUNTER — Other Ambulatory Visit: Payer: Self-pay | Admitting: Internal Medicine

## 2015-02-04 ENCOUNTER — Other Ambulatory Visit: Payer: Self-pay | Admitting: Internal Medicine

## 2015-02-06 ENCOUNTER — Encounter (HOSPITAL_COMMUNITY): Payer: Self-pay | Admitting: Family Medicine

## 2015-02-06 ENCOUNTER — Emergency Department (HOSPITAL_COMMUNITY)
Admission: EM | Admit: 2015-02-06 | Discharge: 2015-02-06 | Disposition: A | Discharge status: Home / Self Care | Payer: Medicare PPO | Attending: Emergency Medicine | Admitting: Emergency Medicine

## 2015-02-06 DIAGNOSIS — Y92481 Parking lot as the place of occurrence of the external cause: Secondary | ICD-10-CM | POA: Diagnosis not present

## 2015-02-06 DIAGNOSIS — I10 Essential (primary) hypertension: Secondary | ICD-10-CM | POA: Diagnosis not present

## 2015-02-06 DIAGNOSIS — E119 Type 2 diabetes mellitus without complications: Secondary | ICD-10-CM | POA: Insufficient documentation

## 2015-02-06 DIAGNOSIS — Y998 Other external cause status: Secondary | ICD-10-CM | POA: Diagnosis not present

## 2015-02-06 DIAGNOSIS — Z7982 Long term (current) use of aspirin: Secondary | ICD-10-CM | POA: Insufficient documentation

## 2015-02-06 DIAGNOSIS — Z79899 Other long term (current) drug therapy: Secondary | ICD-10-CM | POA: Insufficient documentation

## 2015-02-06 DIAGNOSIS — S4992XA Unspecified injury of left shoulder and upper arm, initial encounter: Secondary | ICD-10-CM | POA: Insufficient documentation

## 2015-02-06 DIAGNOSIS — Y9389 Activity, other specified: Secondary | ICD-10-CM | POA: Diagnosis not present

## 2015-02-06 DIAGNOSIS — E876 Hypokalemia: Secondary | ICD-10-CM | POA: Diagnosis not present

## 2015-02-06 DIAGNOSIS — E785 Hyperlipidemia, unspecified: Secondary | ICD-10-CM | POA: Diagnosis not present

## 2015-02-06 DIAGNOSIS — S3991XA Unspecified injury of abdomen, initial encounter: Secondary | ICD-10-CM | POA: Diagnosis not present

## 2015-02-06 DIAGNOSIS — Z85038 Personal history of other malignant neoplasm of large intestine: Secondary | ICD-10-CM | POA: Insufficient documentation

## 2015-02-06 DIAGNOSIS — Z87891 Personal history of nicotine dependence: Secondary | ICD-10-CM | POA: Insufficient documentation

## 2015-02-06 DIAGNOSIS — S29001A Unspecified injury of muscle and tendon of front wall of thorax, initial encounter: Secondary | ICD-10-CM | POA: Diagnosis not present

## 2015-02-06 MED ORDER — TRAMADOL HCL 50 MG PO TABS: 50.0000 mg | ORAL_TABLET | Freq: Four times a day (QID) | ORAL | Status: DC | PRN

## 2015-02-06 MED ORDER — ACETAMINOPHEN 325 MG PO TABS
650.0000 mg | ORAL_TABLET | ORAL | Status: AC
  Administered 2015-02-06: 650 mg via ORAL
  Filled 2015-02-06: qty 2

## 2015-02-06 NOTE — ED Notes (Signed)
Family at bedside. 

## 2015-02-06 NOTE — Discharge Instructions (Signed)
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
## 2015-02-06 NOTE — ED Notes (Signed)
Pt going to received home BP meds per PCP. Pt to be discharged home with family.

## 2015-02-06 NOTE — ED Notes (Signed)
Per Gina Bailey, [place] pt is not going be receiving BP meds before discharge, and that the pt is good to go home.

## 2015-02-06 NOTE — ED Notes (Signed)
EDP at bedside  

## 2015-02-06 NOTE — ED Notes (Signed)
PA at bedside.
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
## 2015-02-12 DIAGNOSIS — M25511 Pain in right shoulder: Secondary | ICD-10-CM | POA: Diagnosis not present

## 2015-02-12 DIAGNOSIS — M545 Low back pain: Secondary | ICD-10-CM | POA: Diagnosis not present

## 2015-02-20 DIAGNOSIS — M79675 Pain in left toe(s): Secondary | ICD-10-CM | POA: Diagnosis not present

## 2015-02-20 DIAGNOSIS — M79674 Pain in right toe(s): Secondary | ICD-10-CM | POA: Diagnosis not present

## 2015-02-26 DIAGNOSIS — R262 Difficulty in walking, not elsewhere classified: Secondary | ICD-10-CM | POA: Diagnosis not present

## 2015-02-26 DIAGNOSIS — M79601 Pain in right arm: Secondary | ICD-10-CM | POA: Diagnosis not present

## 2015-02-26 DIAGNOSIS — M545 Low back pain: Secondary | ICD-10-CM | POA: Diagnosis not present

## 2015-02-26 DIAGNOSIS — M6281 Muscle weakness (generalized): Secondary | ICD-10-CM | POA: Diagnosis not present

## 2015-02-28 DIAGNOSIS — R262 Difficulty in walking, not elsewhere classified: Secondary | ICD-10-CM | POA: Diagnosis not present

## 2015-02-28 DIAGNOSIS — M545 Low back pain: Secondary | ICD-10-CM | POA: Diagnosis not present

## 2015-02-28 DIAGNOSIS — M79601 Pain in right arm: Secondary | ICD-10-CM | POA: Diagnosis not present

## 2015-02-28 DIAGNOSIS — M6281 Muscle weakness (generalized): Secondary | ICD-10-CM | POA: Diagnosis not present

## 2015-03-04 DIAGNOSIS — R262 Difficulty in walking, not elsewhere classified: Secondary | ICD-10-CM | POA: Diagnosis not present

## 2015-03-04 DIAGNOSIS — M545 Low back pain: Secondary | ICD-10-CM | POA: Diagnosis not present

## 2015-03-04 DIAGNOSIS — M6281 Muscle weakness (generalized): Secondary | ICD-10-CM | POA: Diagnosis not present

## 2015-03-04 DIAGNOSIS — M79601 Pain in right arm: Secondary | ICD-10-CM | POA: Diagnosis not present

## 2015-03-05 ENCOUNTER — Ambulatory Visit (INDEPENDENT_AMBULATORY_CARE_PROVIDER_SITE_OTHER): Payer: Medicare PPO | Admitting: Internal Medicine

## 2015-03-05 ENCOUNTER — Encounter: Payer: Self-pay | Admitting: Internal Medicine

## 2015-03-05 VITALS — BP 174/78 | HR 90 | Temp 98.0°F | Ht 65.0 in | Wt 181.5 lb

## 2015-03-05 DIAGNOSIS — E119 Type 2 diabetes mellitus without complications: Secondary | ICD-10-CM | POA: Diagnosis not present

## 2015-03-05 DIAGNOSIS — I10 Essential (primary) hypertension: Secondary | ICD-10-CM

## 2015-03-05 DIAGNOSIS — M791 Myalgia, unspecified site: Secondary | ICD-10-CM

## 2015-03-05 DIAGNOSIS — R911 Solitary pulmonary nodule: Secondary | ICD-10-CM

## 2015-03-05 LAB — POCT GLYCOSYLATED HEMOGLOBIN (HGB A1C): Hemoglobin A1C: 7.2

## 2015-03-05 LAB — GLUCOSE, CAPILLARY: GLUCOSE-CAPILLARY: 115 mg/dL — AB (ref 65–99)

## 2015-03-05 MED ORDER — ACCU-CHEK FASTCLIX LANCETS MISC: Status: DC

## 2015-03-05 NOTE — Assessment & Plan Note (Addendum)
BP Readings from Last 3 Encounters:  03/05/15 174/78  02/06/15 133/72  11/20/14 149/93    Lab Results  Component Value Date   NA 143 11/20/2014   K 3.2* 11/20/2014   CREATININE 1.09 11/20/2014    Assessment: Blood pressure control: mildly elevated Progress toward BP goal:  deteriorated Comments: Patient states her BP is high secondary to the "brisk walk" to the clinic. She reports she is taking her medications. Denies high salt intake. Repeat BP 150/80.   Plan: Medications:  Continue Amlodipine 10 mg daily, HCTZ 12.5 mg daily, and Lisinopril 10 mg daily Other plans:  - Can consider increasing Lisinopril to 20 mg daily if BP elevated at next visit - Pt recommended to follow low sodium diet  - bmet today
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
## 2015-03-05 NOTE — Progress Notes (Signed)
   Subjective:    Patient ID: AIRA OBENOUR, female    DOB: Aug 10, 1945, 69 y.o.   MRN: [id number]  HPI Ms. Schaumann is a 69yo woman with PMHx of HTN, Type 2 DM, and hyperlipidemia who presents today for a routine visit.  Please refer to A&P documentation.    Review of Systems General: Denies fever, chills, night sweats, changes in weight, changes in appetite HEENT: Denies headaches, ear pain, changes in vision, rhinorrhea, sore throat CV: Denies CP, palpitations, SOB, orthopnea Pulm: Denies SOB, cough, wheezing GI: Denies abdominal pain, nausea, vomiting, diarrhea, constipation, melena, hematochezia GU: Denies dysuria, hematuria, frequency Msk: Denies muscle cramps, joint pains Neuro: Denies weakness, numbness, tingling Skin: Denies rashes, bruising    Objective:   Physical Exam General: sitting up in chair, NAD HEENT: [place]/AT, EOMI, sclera anicteric, mucus membranes moist CV: RRR, no m/g/r Pulm: CTA bilaterally, breaths non-labored Abd: BS+, soft, non-tender Ext: warm, no peripheral edema Neuro: alert and oriented x 3, no focal deficits    Assessment & Plan:  Refer to A&P documentation.

## 2015-03-05 NOTE — Patient Instructions (Signed)
-   Try to eat a lower salt diet to help bring down your blood pressure - Continue taking your medications as you have been doing - We will discuss getting a CT scan of your chest at your next visit  General Instructions:   Thank you for bringing your medicines today. This helps [place] keep you safe from mistakes.   Progress Toward Treatment Goals:  Treatment Goal 11/20/2014  Hemoglobin A1C at goal  Blood pressure at goal    Self Care Goals & Plans:  Self Care Goal 11/20/2014  Manage my medications take my medicines as prescribed; bring my medications to every visit; refill my medications on time  Monitor my health keep track of my blood glucose; bring my glucose meter and log to each visit  Eat healthy foods drink diet soda or water instead of juice or soda; eat more vegetables; eat foods that are low in salt; eat baked foods instead of fried foods; eat smaller portions  Be physically active find an activity I enjoy; take a walk every day    Home Blood Glucose Monitoring 11/20/2014  Check my blood sugar 2 times a day  When to check my blood sugar before meals     Care Management & Community Referrals:  Referral 04/24/2013  Referrals made for care management support none needed

## 2015-03-05 NOTE — Assessment & Plan Note (Signed)
Lab Results  Component Value Date   HGBA1C 7.2 03/05/2015   HGBA1C 7.6 11/20/2014   HGBA1C 7.0 05/22/2014     Assessment: Diabetes control: good control (HgbA1C at goal) Progress toward A1C goal:  at goal Comments: Diet controlled. Blood sugars in good range in 90s-120s.   Plan: Medications:  Continue diet control Home glucose monitoring: Frequency: 2 times a day Timing: before meals Instruction/counseling given: reminded to bring blood glucose meter & log to each visit, reminded to bring medications to each visit and discussed diet Self management tools provided: copy of home glucose meter download Other plans:  - Check HbA1c in 3 months - Remind patient to get eye exam next visit

## 2015-03-06 DIAGNOSIS — R262 Difficulty in walking, not elsewhere classified: Secondary | ICD-10-CM | POA: Diagnosis not present

## 2015-03-06 DIAGNOSIS — M6281 Muscle weakness (generalized): Secondary | ICD-10-CM | POA: Diagnosis not present

## 2015-03-06 DIAGNOSIS — M79601 Pain in right arm: Secondary | ICD-10-CM | POA: Diagnosis not present

## 2015-03-06 DIAGNOSIS — M545 Low back pain: Secondary | ICD-10-CM | POA: Diagnosis not present

## 2015-03-06 LAB — BASIC METABOLIC PANEL
BUN/Creatinine Ratio: 13 (ref 11–26)
BUN: 14 mg/dL (ref 8–27)
CO2: 29 mmol/L (ref 18–29)
CREATININE: 1.04 mg/dL — AB (ref 0.57–1.00)
Calcium: 9.5 mg/dL (ref 8.7–10.3)
Chloride: 99 mmol/L (ref 97–108)
GFR calc Af Amer: 62 mL/min/{1.73_m2} (ref >59)
GFR, EST NON AFRICAN AMERICAN: 54 mL/min/{1.73_m2} — AB (ref >59)
Glucose: 119 mg/dL — ABNORMAL HIGH (ref 65–99)
Potassium: 3.3 mmol/L — ABNORMAL LOW (ref 3.5–5.2)
SODIUM: 145 mmol/L — AB (ref 134–144)

## 2015-03-06 NOTE — Progress Notes (Signed)
Internal Medicine Clinic Attending  Case discussed with Dr. Rivet soon after the resident saw the patient.  We reviewed the resident's history and exam and pertinent patient test results.  I agree with the assessment, diagnosis, and plan of care documented in the resident's note.  

## 2015-03-11 DIAGNOSIS — M6281 Muscle weakness (generalized): Secondary | ICD-10-CM | POA: Diagnosis not present

## 2015-03-11 DIAGNOSIS — M79601 Pain in right arm: Secondary | ICD-10-CM | POA: Diagnosis not present

## 2015-03-11 DIAGNOSIS — M545 Low back pain: Secondary | ICD-10-CM | POA: Diagnosis not present

## 2015-03-11 DIAGNOSIS — R262 Difficulty in walking, not elsewhere classified: Secondary | ICD-10-CM | POA: Diagnosis not present

## 2015-03-13 DIAGNOSIS — M25511 Pain in right shoulder: Secondary | ICD-10-CM | POA: Diagnosis not present

## 2015-03-13 DIAGNOSIS — M545 Low back pain: Secondary | ICD-10-CM | POA: Diagnosis not present

## 2015-03-15 DIAGNOSIS — M6281 Muscle weakness (generalized): Secondary | ICD-10-CM | POA: Diagnosis not present

## 2015-03-15 DIAGNOSIS — M79601 Pain in right arm: Secondary | ICD-10-CM | POA: Diagnosis not present

## 2015-03-15 DIAGNOSIS — R262 Difficulty in walking, not elsewhere classified: Secondary | ICD-10-CM | POA: Diagnosis not present

## 2015-03-15 DIAGNOSIS — M545 Low back pain: Secondary | ICD-10-CM | POA: Diagnosis not present

## 2015-03-18 DIAGNOSIS — M545 Low back pain: Secondary | ICD-10-CM | POA: Diagnosis not present

## 2015-03-18 DIAGNOSIS — R262 Difficulty in walking, not elsewhere classified: Secondary | ICD-10-CM | POA: Diagnosis not present

## 2015-03-18 DIAGNOSIS — M79601 Pain in right arm: Secondary | ICD-10-CM | POA: Diagnosis not present

## 2015-03-18 DIAGNOSIS — M6281 Muscle weakness (generalized): Secondary | ICD-10-CM | POA: Diagnosis not present

## 2015-03-20 DIAGNOSIS — L03032 Cellulitis of left toe: Secondary | ICD-10-CM | POA: Diagnosis not present

## 2015-03-21 DIAGNOSIS — M545 Low back pain: Secondary | ICD-10-CM | POA: Diagnosis not present

## 2015-03-21 DIAGNOSIS — M79601 Pain in right arm: Secondary | ICD-10-CM | POA: Diagnosis not present

## 2015-03-21 DIAGNOSIS — M6281 Muscle weakness (generalized): Secondary | ICD-10-CM | POA: Diagnosis not present

## 2015-03-21 DIAGNOSIS — R262 Difficulty in walking, not elsewhere classified: Secondary | ICD-10-CM | POA: Diagnosis not present

## 2015-03-26 DIAGNOSIS — M79601 Pain in right arm: Secondary | ICD-10-CM | POA: Diagnosis not present

## 2015-03-26 DIAGNOSIS — M6281 Muscle weakness (generalized): Secondary | ICD-10-CM | POA: Diagnosis not present

## 2015-03-26 DIAGNOSIS — R262 Difficulty in walking, not elsewhere classified: Secondary | ICD-10-CM | POA: Diagnosis not present

## 2015-03-26 DIAGNOSIS — M545 Low back pain: Secondary | ICD-10-CM | POA: Diagnosis not present

## 2015-04-01 DIAGNOSIS — M545 Low back pain: Secondary | ICD-10-CM | POA: Diagnosis not present

## 2015-04-01 DIAGNOSIS — M6281 Muscle weakness (generalized): Secondary | ICD-10-CM | POA: Diagnosis not present

## 2015-04-01 DIAGNOSIS — M79601 Pain in right arm: Secondary | ICD-10-CM | POA: Diagnosis not present

## 2015-04-01 DIAGNOSIS — R262 Difficulty in walking, not elsewhere classified: Secondary | ICD-10-CM | POA: Diagnosis not present

## 2015-04-03 DIAGNOSIS — M545 Low back pain: Secondary | ICD-10-CM | POA: Diagnosis not present

## 2015-04-03 DIAGNOSIS — M79601 Pain in right arm: Secondary | ICD-10-CM | POA: Diagnosis not present

## 2015-04-03 DIAGNOSIS — R262 Difficulty in walking, not elsewhere classified: Secondary | ICD-10-CM | POA: Diagnosis not present

## 2015-04-03 DIAGNOSIS — M6281 Muscle weakness (generalized): Secondary | ICD-10-CM | POA: Diagnosis not present

## 2015-04-04 DIAGNOSIS — B351 Tinea unguium: Secondary | ICD-10-CM | POA: Diagnosis not present

## 2015-04-04 DIAGNOSIS — M79675 Pain in left toe(s): Secondary | ICD-10-CM | POA: Diagnosis not present

## 2015-04-04 DIAGNOSIS — M79674 Pain in right toe(s): Secondary | ICD-10-CM | POA: Diagnosis not present

## 2015-04-08 DIAGNOSIS — M545 Low back pain: Secondary | ICD-10-CM | POA: Diagnosis not present

## 2015-04-08 DIAGNOSIS — M6281 Muscle weakness (generalized): Secondary | ICD-10-CM | POA: Diagnosis not present

## 2015-04-08 DIAGNOSIS — M79601 Pain in right arm: Secondary | ICD-10-CM | POA: Diagnosis not present

## 2015-04-08 DIAGNOSIS — R262 Difficulty in walking, not elsewhere classified: Secondary | ICD-10-CM | POA: Diagnosis not present

## 2015-04-10 DIAGNOSIS — R262 Difficulty in walking, not elsewhere classified: Secondary | ICD-10-CM | POA: Diagnosis not present

## 2015-04-10 DIAGNOSIS — M545 Low back pain: Secondary | ICD-10-CM | POA: Diagnosis not present

## 2015-04-10 DIAGNOSIS — M6281 Muscle weakness (generalized): Secondary | ICD-10-CM | POA: Diagnosis not present

## 2015-04-10 DIAGNOSIS — M79601 Pain in right arm: Secondary | ICD-10-CM | POA: Diagnosis not present

## 2015-04-15 DIAGNOSIS — R262 Difficulty in walking, not elsewhere classified: Secondary | ICD-10-CM | POA: Diagnosis not present

## 2015-04-15 DIAGNOSIS — M545 Low back pain: Secondary | ICD-10-CM | POA: Diagnosis not present

## 2015-04-15 DIAGNOSIS — M6281 Muscle weakness (generalized): Secondary | ICD-10-CM | POA: Diagnosis not present

## 2015-04-15 DIAGNOSIS — M79601 Pain in right arm: Secondary | ICD-10-CM | POA: Diagnosis not present

## 2015-04-18 DIAGNOSIS — M79601 Pain in right arm: Secondary | ICD-10-CM | POA: Diagnosis not present

## 2015-04-18 DIAGNOSIS — M6281 Muscle weakness (generalized): Secondary | ICD-10-CM | POA: Diagnosis not present

## 2015-04-18 DIAGNOSIS — R262 Difficulty in walking, not elsewhere classified: Secondary | ICD-10-CM | POA: Diagnosis not present

## 2015-04-18 DIAGNOSIS — M545 Low back pain: Secondary | ICD-10-CM | POA: Diagnosis not present

## 2015-04-22 DIAGNOSIS — M79601 Pain in right arm: Secondary | ICD-10-CM | POA: Diagnosis not present

## 2015-04-22 DIAGNOSIS — M545 Low back pain: Secondary | ICD-10-CM | POA: Diagnosis not present

## 2015-04-22 DIAGNOSIS — M6281 Muscle weakness (generalized): Secondary | ICD-10-CM | POA: Diagnosis not present

## 2015-04-22 DIAGNOSIS — R262 Difficulty in walking, not elsewhere classified: Secondary | ICD-10-CM | POA: Diagnosis not present

## 2015-04-25 DIAGNOSIS — M79601 Pain in right arm: Secondary | ICD-10-CM | POA: Diagnosis not present

## 2015-04-25 DIAGNOSIS — M545 Low back pain: Secondary | ICD-10-CM | POA: Diagnosis not present

## 2015-04-25 DIAGNOSIS — R262 Difficulty in walking, not elsewhere classified: Secondary | ICD-10-CM | POA: Diagnosis not present

## 2015-04-25 DIAGNOSIS — M6281 Muscle weakness (generalized): Secondary | ICD-10-CM | POA: Diagnosis not present

## 2015-04-29 DIAGNOSIS — R262 Difficulty in walking, not elsewhere classified: Secondary | ICD-10-CM | POA: Diagnosis not present

## 2015-04-29 DIAGNOSIS — M6281 Muscle weakness (generalized): Secondary | ICD-10-CM | POA: Diagnosis not present

## 2015-04-29 DIAGNOSIS — M545 Low back pain: Secondary | ICD-10-CM | POA: Diagnosis not present

## 2015-04-29 DIAGNOSIS — M79601 Pain in right arm: Secondary | ICD-10-CM | POA: Diagnosis not present

## 2015-05-02 DIAGNOSIS — M79601 Pain in right arm: Secondary | ICD-10-CM | POA: Diagnosis not present

## 2015-05-02 DIAGNOSIS — M545 Low back pain: Secondary | ICD-10-CM | POA: Diagnosis not present

## 2015-05-02 DIAGNOSIS — M6281 Muscle weakness (generalized): Secondary | ICD-10-CM | POA: Diagnosis not present

## 2015-05-02 DIAGNOSIS — R262 Difficulty in walking, not elsewhere classified: Secondary | ICD-10-CM | POA: Diagnosis not present

## 2015-05-06 DIAGNOSIS — M6281 Muscle weakness (generalized): Secondary | ICD-10-CM | POA: Diagnosis not present

## 2015-05-06 DIAGNOSIS — R262 Difficulty in walking, not elsewhere classified: Secondary | ICD-10-CM | POA: Diagnosis not present

## 2015-05-06 DIAGNOSIS — M79601 Pain in right arm: Secondary | ICD-10-CM | POA: Diagnosis not present

## 2015-05-06 DIAGNOSIS — M545 Low back pain: Secondary | ICD-10-CM | POA: Diagnosis not present

## 2015-05-08 DIAGNOSIS — M545 Low back pain: Secondary | ICD-10-CM | POA: Diagnosis not present

## 2015-05-08 DIAGNOSIS — M25511 Pain in right shoulder: Secondary | ICD-10-CM | POA: Diagnosis not present

## 2015-06-04 ENCOUNTER — Ambulatory Visit (INDEPENDENT_AMBULATORY_CARE_PROVIDER_SITE_OTHER): Payer: Medicare PPO | Admitting: Internal Medicine

## 2015-06-04 ENCOUNTER — Encounter: Payer: Self-pay | Admitting: Internal Medicine

## 2015-06-04 DIAGNOSIS — E1159 Type 2 diabetes mellitus with other circulatory complications: Secondary | ICD-10-CM

## 2015-06-04 DIAGNOSIS — E876 Hypokalemia: Secondary | ICD-10-CM | POA: Diagnosis not present

## 2015-06-04 DIAGNOSIS — Z0000 Encounter for general adult medical examination without abnormal findings: Secondary | ICD-10-CM

## 2015-06-04 DIAGNOSIS — E119 Type 2 diabetes mellitus without complications: Secondary | ICD-10-CM

## 2015-06-04 DIAGNOSIS — R911 Solitary pulmonary nodule: Secondary | ICD-10-CM | POA: Diagnosis not present

## 2015-06-04 DIAGNOSIS — Z23 Encounter for immunization: Secondary | ICD-10-CM | POA: Diagnosis not present

## 2015-06-04 DIAGNOSIS — I10 Essential (primary) hypertension: Secondary | ICD-10-CM

## 2015-06-04 DIAGNOSIS — M8580 Other specified disorders of bone density and structure, unspecified site: Secondary | ICD-10-CM

## 2015-06-04 LAB — POCT GLYCOSYLATED HEMOGLOBIN (HGB A1C): HEMOGLOBIN A1C: 8.1

## 2015-06-04 LAB — GLUCOSE, CAPILLARY: GLUCOSE-CAPILLARY: 136 mg/dL — AB (ref 65–99)

## 2015-06-04 MED ORDER — POTASSIUM CHLORIDE ER 20 MEQ PO TBCR: 20.0000 meq | EXTENDED_RELEASE_TABLET | Freq: Every day | ORAL | Status: DC

## 2015-06-04 MED ORDER — ACCU-CHEK FASTCLIX LANCETS MISC: Status: DC

## 2015-06-04 MED ORDER — LISINOPRIL 10 MG PO TABS: 10.0000 mg | ORAL_TABLET | Freq: Every day | ORAL | Status: DC

## 2015-06-04 MED ORDER — ACCU-CHEK NANO SMARTVIEW W/DEVICE KIT: 1.0000 | PACK | Status: DC

## 2015-06-04 MED ORDER — AMLODIPINE BESYLATE 10 MG PO TABS: 10.0000 mg | ORAL_TABLET | Freq: Every day | ORAL | Status: DC
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
## 2015-06-04 NOTE — Patient Instructions (Signed)
It was a pleasure seeing you today, Gina Bailey.  - Try to continue a low salt diet to keep your blood pressure under good control - Try to eat more vegetables instead of fruits or breads/crackers/rice to prevent your blood sugar from getting too high - We will get CT scan scheduled for you  General Instructions:   Thank you for bringing your medicines today. This helps [place] keep you safe from mistakes.   Progress Toward Treatment Goals:  Treatment Goal 03/05/2015  Hemoglobin A1C at goal  Blood pressure deteriorated    Self Care Goals & Plans:  Self Care Goal 06/04/2015  Manage my medications take my medicines as prescribed; bring my medications to every visit; refill my medications on time  Monitor my health keep track of my blood glucose; bring my glucose meter and log to each visit; keep track of my blood pressure; check my feet daily  Eat healthy foods eat more vegetables; eat foods that are low in salt; eat baked foods instead of fried foods  Be physically active find an activity I enjoy    Home Blood Glucose Monitoring 03/05/2015  Check my blood sugar 2 times a day  When to check my blood sugar before meals     Care Management & Community Referrals:  Referral 04/24/2013  Referrals made for care management support none needed

## 2015-06-05 NOTE — Assessment & Plan Note (Signed)
Lab Results  Component Value Date   HGBA1C 8.1 06/04/2015   HGBA1C 7.2 03/05/2015   HGBA1C 7.6 11/20/2014     Assessment: Diabetes control: fair control Progress toward A1C goal:  deteriorated Comments: HbA1c has increased to 8.1 from 7.2. She reports eating more fruit and carbohydrates lately.   Plan: Medications:  Diet controlled  Home glucose monitoring: Frequency: 2 times a day Timing: before meals Instruction/counseling given: reminded to bring blood glucose meter & log to each visit, reminded to bring medications to each visit, discussed foot care and discussed diet Self management tools provided: copy of home glucose meter download Other plans:  - Recommended to eat more vegetables and to cut down on fruit/carb intake - Pt reports she had eye exam already this year- need to get records - Repeat HbA1c in 3 months

## 2015-06-05 NOTE — Assessment & Plan Note (Addendum)
BP Readings from Last 3 Encounters:  06/04/15 148/88  03/05/15 174/78  02/06/15 133/72    Lab Results  Component Value Date   NA 145* 03/05/2015   K 3.3* 03/05/2015   CREATININE 1.04* 03/05/2015    Assessment: Blood pressure control: mildly elevated Progress toward BP goal:  improved Comments: BP improved from last visit. Patient reports following a low sodium diet which has seemed to help her BP control.   Plan: Medications:  Continue Amlodipine 10 mg daily, HCTZ 12.5 mg daily, and Lisinopril 10 mg daily.  Other plans:  - Recommended to continue a low sodium diet  - Refilled her K-Dur 20 mEq daily

## 2015-06-07 NOTE — Assessment & Plan Note (Signed)
Patient agreeable to getting repeat CT Chest today. Will order and have scheduled for patient.

## 2015-06-07 NOTE — Assessment & Plan Note (Signed)
Continue calcium and vitamin D supplementation.  

## 2015-06-07 NOTE — Assessment & Plan Note (Signed)
Flu and PNA vaccines today

## 2015-06-11 NOTE — Progress Notes (Signed)
Case discussed with Dr. Arcelia Jew soon after the resident saw the patient.  We reviewed the resident's history and exam and pertinent patient test results.  I agree with the assessment, diagnosis and plan of care documented in the resident's note.  If the blood pressure remains elevated at the next visit will escalate pharmacologic therapy.  If there is no change in the 3 mm SPN after > 1 year than no further evaluation will be necessary per the Fleischner criteria.  I agree with giving the patient a chance to control her diabetes better with lifestyle/dietary changes.

## 2015-06-27 ENCOUNTER — Other Ambulatory Visit: Payer: Self-pay | Admitting: Internal Medicine

## 2015-07-01 ENCOUNTER — Telehealth: Payer: Self-pay | Admitting: *Deleted

## 2015-07-01 NOTE — Telephone Encounter (Signed)
Pt called / informed Chest CT scheduled 12/16 @ 1500 PM with 1445PM arrival here @ [facility] radiology dept. Pt voiced understanding.

## 2015-07-05 ENCOUNTER — Ambulatory Visit (HOSPITAL_COMMUNITY)
Admission: RE | Admit: 2015-07-05 | Discharge: 2015-07-05 | Disposition: A | Discharge status: Home / Self Care | Payer: Medicare PPO | Source: Ambulatory Visit | Attending: Internal Medicine | Admitting: Internal Medicine

## 2015-07-05 DIAGNOSIS — R911 Solitary pulmonary nodule: Secondary | ICD-10-CM | POA: Insufficient documentation

## 2015-07-05 DIAGNOSIS — I2510 Atherosclerotic heart disease of native coronary artery without angina pectoris: Secondary | ICD-10-CM | POA: Insufficient documentation

## 2015-07-05 DIAGNOSIS — Z87891 Personal history of nicotine dependence: Secondary | ICD-10-CM | POA: Insufficient documentation

## 2015-07-05 DIAGNOSIS — R918 Other nonspecific abnormal finding of lung field: Secondary | ICD-10-CM | POA: Insufficient documentation

## 2015-07-09 ENCOUNTER — Other Ambulatory Visit: Payer: Self-pay | Admitting: Internal Medicine

## 2015-07-10 ENCOUNTER — Other Ambulatory Visit: Payer: Self-pay

## 2015-07-10 DIAGNOSIS — Z1231 Encounter for screening mammogram for malignant neoplasm of breast: Secondary | ICD-10-CM

## 2015-07-11 ENCOUNTER — Telehealth: Payer: Self-pay | Admitting: Internal Medicine

## 2015-07-11 NOTE — Telephone Encounter (Signed)
Discussed CT Chest results with patient.

## 2015-08-16 ENCOUNTER — Ambulatory Visit
Admission: RE | Admit: 2015-08-16 | Discharge: 2015-08-16 | Disposition: A | Discharge status: Home / Self Care | Payer: 59 | Source: Ambulatory Visit

## 2015-08-16 DIAGNOSIS — Z1231 Encounter for screening mammogram for malignant neoplasm of breast: Secondary | ICD-10-CM

## 2015-08-21 ENCOUNTER — Other Ambulatory Visit: Payer: Self-pay | Admitting: Internal Medicine

## 2015-11-06 LAB — HM DIABETES EYE EXAM

## 2015-11-12 ENCOUNTER — Ambulatory Visit (INDEPENDENT_AMBULATORY_CARE_PROVIDER_SITE_OTHER): Payer: Medicare Other | Admitting: Internal Medicine

## 2015-11-12 ENCOUNTER — Encounter: Payer: Self-pay | Admitting: Internal Medicine

## 2015-11-12 VITALS — BP 153/93 | HR 84 | Temp 98.6°F | Ht 65.0 in | Wt 187.1 lb

## 2015-11-12 DIAGNOSIS — I152 Hypertension secondary to endocrine disorders: Secondary | ICD-10-CM | POA: Diagnosis not present

## 2015-11-12 DIAGNOSIS — R911 Solitary pulmonary nodule: Secondary | ICD-10-CM

## 2015-11-12 DIAGNOSIS — E1169 Type 2 diabetes mellitus with other specified complication: Secondary | ICD-10-CM | POA: Diagnosis not present

## 2015-11-12 DIAGNOSIS — I10 Essential (primary) hypertension: Secondary | ICD-10-CM

## 2015-11-12 DIAGNOSIS — E119 Type 2 diabetes mellitus without complications: Secondary | ICD-10-CM

## 2015-11-12 DIAGNOSIS — M25512 Pain in left shoulder: Secondary | ICD-10-CM

## 2015-11-12 DIAGNOSIS — E784 Other hyperlipidemia: Secondary | ICD-10-CM | POA: Diagnosis not present

## 2015-11-12 DIAGNOSIS — E1159 Type 2 diabetes mellitus with other circulatory complications: Secondary | ICD-10-CM | POA: Diagnosis not present

## 2015-11-12 DIAGNOSIS — M25511 Pain in right shoulder: Secondary | ICD-10-CM

## 2015-11-12 HISTORY — DX: Pain in left shoulder: M25.512

## 2015-11-12 LAB — POCT GLYCOSYLATED HEMOGLOBIN (HGB A1C): HEMOGLOBIN A1C: 8.8

## 2015-11-12 LAB — GLUCOSE, CAPILLARY: Glucose-Capillary: 149 mg/dL — ABNORMAL HIGH (ref 65–99)

## 2015-11-12 MED ORDER — LISINOPRIL 20 MG PO TABS: 10.0000 mg | ORAL_TABLET | Freq: Every day | ORAL | Status: DC

## 2015-11-12 NOTE — Assessment & Plan Note (Signed)
She does not need any further work up for her pulmonary nodule given it's stability. As far as the ground glass findings, she currently does not have any pulmonary symptoms. I will check a repeat CT chest in Dec to see if there are any changes. If she develops changes, then can consider getting CT chest sooner.

## 2015-11-12 NOTE — Assessment & Plan Note (Signed)
BP moderately high today, but recheck was better at 153/93. Her BP has been consistently on higher side the past few visits. Will increase her Lisinopril to 20 mg daily. Continue HCTZ 12.5 mg daily and Norvasc 10 mg daily.  - Checking bmet today
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
## 2015-11-12 NOTE — Assessment & Plan Note (Addendum)
Her right shoulder pain does not seem severe as she had full ROM and minimal tenderness on exam. I advised her to manage pain with Tylenol (up to 3,000 mg a day) as needed. If her pain becomes more severe or she develops limitations in ROM would consider PT consult and x-ray of shoulder. Rotator cuff tendinopathy vs osteoarthritis seems most likely source for her pain.

## 2015-11-12 NOTE — Patient Instructions (Signed)
-   Increase Lisinopril to 20 mg daily - Please start checking your blood sugars after lunch and dinner so we can see how high your blood sugars are getting after meals - Follow up in 2 weeks to reassess blood sugars   General Instructions:   Thank you for bringing your medicines today. This helps [place] keep you safe from mistakes.   Progress Toward Treatment Goals:  Treatment Goal 06/04/2015  Hemoglobin A1C deteriorated  Blood pressure improved    Self Care Goals & Plans:  Self Care Goal 06/04/2015  Manage my medications take my medicines as prescribed; bring my medications to every visit; refill my medications on time  Monitor my health keep track of my blood glucose; bring my glucose meter and log to each visit; keep track of my blood pressure; check my feet daily  Eat healthy foods eat more vegetables; eat foods that are low in salt; eat baked foods instead of fried foods  Be physically active find an activity I enjoy    Home Blood Glucose Monitoring 06/04/2015  Check my blood sugar 2 times a day  When to check my blood sugar before meals     Care Management & Community Referrals:  Referral 04/24/2013  Referrals made for care management support none needed

## 2015-11-12 NOTE — Assessment & Plan Note (Signed)
Her A1c has steadily increased over the last year. She admits to not liking to take medications but we discussed she may need to start one for better diabetes control. We discussed the complications of uncontrolled diabetes. I advised her to stop checking her blood sugars in the morning since we know her blood sugars are fine at that time, but instead to check post-prandially as this is likely where her blood sugars are becoming elevated. I recommended to check post-lunch and post-dinner CBGs for the next 2 weeks and then have her follow up so we can reassess her glycemic control needs. - Follow up in 2 weeks - Likely will need to add on a gliptin for post-prandial hyperglycemia  - Recent eye exam with Dr. Katy Fitch- follow up report

## 2015-11-13 ENCOUNTER — Encounter: Payer: Self-pay | Admitting: *Deleted

## 2015-11-13 LAB — BMP8+ANION GAP
ANION GAP: 17 mmol/L (ref 10.0–18.0)
BUN/Creatinine Ratio: 9 — ABNORMAL LOW (ref 12–28)
BUN: 11 mg/dL (ref 8–27)
CALCIUM: 9.6 mg/dL (ref 8.7–10.3)
CO2: 28 mmol/L (ref 18–29)
Chloride: 100 mmol/L (ref 96–106)
Creatinine, Ser: 1.16 mg/dL — ABNORMAL HIGH (ref 0.57–1.00)
GFR, EST AFRICAN AMERICAN: 55 mL/min/{1.73_m2} — AB (ref >59)
GFR, EST NON AFRICAN AMERICAN: 47 mL/min/{1.73_m2} — AB (ref >59)
Glucose: 127 mg/dL — ABNORMAL HIGH (ref 65–99)
Potassium: 3.7 mmol/L (ref 3.5–5.2)
Sodium: 145 mmol/L — ABNORMAL HIGH (ref 134–144)

## 2015-11-14 NOTE — Progress Notes (Signed)
Internal Medicine Clinic Attending  Case discussed with Dr. Rivet soon after the resident saw the patient.  We reviewed the resident's history and exam and pertinent patient test results.  I agree with the assessment, diagnosis, and plan of care documented in the resident's note.  

## 2015-11-27 ENCOUNTER — Encounter: Payer: Self-pay | Admitting: Internal Medicine

## 2015-11-27 ENCOUNTER — Ambulatory Visit (INDEPENDENT_AMBULATORY_CARE_PROVIDER_SITE_OTHER): Payer: Medicare Other | Admitting: Internal Medicine

## 2015-11-27 VITALS — BP 142/84 | HR 84 | Temp 98.7°F | Ht 65.0 in | Wt 184.9 lb

## 2015-11-27 DIAGNOSIS — I152 Hypertension secondary to endocrine disorders: Secondary | ICD-10-CM | POA: Diagnosis not present

## 2015-11-27 DIAGNOSIS — E1159 Type 2 diabetes mellitus with other circulatory complications: Secondary | ICD-10-CM | POA: Diagnosis not present

## 2015-11-27 DIAGNOSIS — E119 Type 2 diabetes mellitus without complications: Secondary | ICD-10-CM

## 2015-11-27 DIAGNOSIS — I10 Essential (primary) hypertension: Secondary | ICD-10-CM

## 2015-11-27 NOTE — Progress Notes (Signed)
Internal Medicine Clinic Attending  Case discussed with Dr. Rivet at the time of the visit.  We reviewed the resident's history and exam and pertinent patient test results.  I agree with the assessment, diagnosis, and plan of care documented in the resident's note.  

## 2015-11-27 NOTE — Assessment & Plan Note (Signed)
BP still mildly elevated. She did not increase her Lisinopril as instructed as she was waiting to run out of her current bottle before refilling with the higher dose. She will refill Lisinopril 20 mg daily this Saturday. Continue HCTZ 12.5 mg daily and Amlodipine 10 mg daily. Will have her follow up in a few weeks for BP recheck.

## 2015-11-27 NOTE — Progress Notes (Signed)
   Subjective:    Patient ID: Gina Bailey, female    DOB: February 04, 1946, 70 y.o.   MRN: [id number]  HPI Gina Bailey is a 71yo woman with PMHx of HTN, type 2 DM, osteopenia, and hyperlipidemia who presents today for follow up of her hypertension.  HTN: BP today is 151/86. Repeat BP 142/84. Her Lisinopril was increased to 20 mg daily last visit, however she reports she did not increase her dose at home. She is also taking Amlodipine 10 mg daily and HCTZ 12.5 mg daily.   Type 2 DM: Last A1c 8.8. Her A1c has steadily been increasing over the last few months. She did check post-prandial blood sugars and these are excellent, in the 90-120 range. Only one reading in the 170s. She reports she plans on joining the silver sneakers program now that the weather is warming up.   Review of Systems General: Denies fever, chills, night sweats, changes in weight, changes in appetite HEENT: Denies headaches, ear pain, changes in vision, rhinorrhea, sore throat CV: Denies CP, palpitations, SOB, orthopnea Pulm: Denies SOB, cough, wheezing GI: Denies abdominal pain, nausea, vomiting, diarrhea, constipation, melena, hematochezia GU: Denies dysuria, hematuria, frequency Msk: Denies muscle cramps, joint pains Neuro: Denies weakness, numbness, tingling Skin: Denies rashes, bruising Psych: Denies depression, anxiety, hallucinations    Objective:   Physical Exam General: alert, sitting up, NAD, pleasant  HEENT: [place]/AT, EOMI, sclera anicteric, mucus membranes moist CV: RRR, no m/g/r Pulm: CTA bilaterally, breaths non-labored Abd: BS+, soft, non-tender Ext: warm, no peripheral edema Neuro: alert and oriented x 3    Assessment & Plan:  Please refer to A&P documentation.

## 2015-11-27 NOTE — Assessment & Plan Note (Signed)
Her A1c has been trending up, but her A1c does not match up with her blood sugars. Her blood sugars are excellent throughout the day. Causes for falsely high A1c include kidney disease and anemia. Looking back, her last Hgb was 10.3 in 2010 and has not been rechecked since that time. I will check a CBC and ferritin today to see if anemia could be explaining her higher A1c. She denies any blood in her stool or dark stools. She last had a colonoscopy in 2013 which showed pandiverticulosis and one small sessile polyp. Pathology showed this to be a benign hyperplastic polyp. Will have her follow up in 1 month to see how blood sugars are doing as well as her BP. Advised her to switch back to checking CBGs before meals.

## 2015-11-27 NOTE — Patient Instructions (Signed)
General Instructions: - We will check blood work today to see if you are anemic. - Your blood sugars are doing great! Keep up the great work. - You can go back to testing before meals, try to test 2 times daily - Now that the weather is warming up, please try to walk more!   Thank you for bringing your medicines today. This helps [place] keep you safe from mistakes.   Progress Toward Treatment Goals:  Treatment Goal 06/04/2015  Hemoglobin A1C deteriorated  Blood pressure improved    Self Care Goals & Plans:  Self Care Goal 06/04/2015  Manage my medications take my medicines as prescribed; bring my medications to every visit; refill my medications on time  Monitor my health keep track of my blood glucose; bring my glucose meter and log to each visit; keep track of my blood pressure; check my feet daily  Eat healthy foods eat more vegetables; eat foods that are low in salt; eat baked foods instead of fried foods  Be physically active find an activity I enjoy    Home Blood Glucose Monitoring 06/04/2015  Check my blood sugar 2 times a day  When to check my blood sugar before meals     Care Management & Community Referrals:  Referral 04/24/2013  Referrals made for care management support none needed

## 2015-11-28 LAB — CBC
Hematocrit: 40.4 % (ref 34.0–46.6)
Hemoglobin: 13 g/dL (ref 11.1–15.9)
MCH: 27.4 pg (ref 26.6–33.0)
MCHC: 32.2 g/dL (ref 31.5–35.7)
MCV: 85 fL (ref 79–97)
PLATELETS: 254 10*3/uL (ref 150–379)
RBC: 4.75 x10E6/uL (ref 3.77–5.28)
RDW: 14.9 % (ref 12.3–15.4)
WBC: 7 10*3/uL (ref 3.4–10.8)

## 2015-11-28 LAB — FERRITIN: Ferritin: 71 ng/mL (ref 15–150)

## 2015-12-02 ENCOUNTER — Telehealth: Payer: Self-pay | Admitting: Dietician

## 2015-12-02 NOTE — Telephone Encounter (Signed)
Got a new meter exactly the same as the od one. She is afraid to use it until we see it. Encouraged her to begin using it because it is the asnme as her sold one. She can bring it by anytime to have [place] set the clock  For her. She will try to call before she comes to be sure someone is available to help her.

## 2015-12-17 ENCOUNTER — Other Ambulatory Visit: Payer: Self-pay | Admitting: Internal Medicine

## 2015-12-17 MED ORDER — GLUCOSE BLOOD VI STRP: ORAL_STRIP | Status: DC

## 2015-12-17 NOTE — Telephone Encounter (Signed)
Pt needs new rx for test strips. [facility] [place]

## 2016-01-11 ENCOUNTER — Other Ambulatory Visit: Payer: Self-pay | Admitting: Internal Medicine

## 2016-01-28 ENCOUNTER — Ambulatory Visit (INDEPENDENT_AMBULATORY_CARE_PROVIDER_SITE_OTHER): Payer: Medicare Other | Admitting: Internal Medicine

## 2016-01-28 ENCOUNTER — Encounter: Payer: Self-pay | Admitting: Internal Medicine

## 2016-01-28 DIAGNOSIS — I152 Hypertension secondary to endocrine disorders: Secondary | ICD-10-CM | POA: Diagnosis not present

## 2016-01-28 DIAGNOSIS — E119 Type 2 diabetes mellitus without complications: Secondary | ICD-10-CM

## 2016-01-28 DIAGNOSIS — M25511 Pain in right shoulder: Secondary | ICD-10-CM

## 2016-01-28 DIAGNOSIS — E1159 Type 2 diabetes mellitus with other circulatory complications: Secondary | ICD-10-CM | POA: Diagnosis not present

## 2016-01-28 DIAGNOSIS — I10 Essential (primary) hypertension: Principal | ICD-10-CM

## 2016-01-28 LAB — GLUCOSE, CAPILLARY: GLUCOSE-CAPILLARY: 115 mg/dL — AB (ref 65–99)

## 2016-01-28 LAB — POCT GLYCOSYLATED HEMOGLOBIN (HGB A1C): HEMOGLOBIN A1C: 8.3

## 2016-01-28 MED ORDER — LISINOPRIL 20 MG PO TABS: 20.0000 mg | ORAL_TABLET | Freq: Every day | ORAL | Status: DC

## 2016-01-28 NOTE — Progress Notes (Signed)
   Subjective:    Patient ID: Gina Bailey, female    DOB: 22-Jun-1946, 70 y.o.   MRN: [id number]  HPI Gina Bailey is a 69yo woman with PMHx of HTN, type 2 DM, and hyperlipidemia who presents today for follow up of her hypertension.  HTN: BP initially 156/86. On recheck BP was 142/90. She is taking Lisinopril 10 mg daily, HCTZ 12.5 mg daily, and Amlodipine 10 mg daily. Her Lisinopril was increased last visit to 20 mg daily, but she states when she picked up her prescription she was given 10 mg tablets.   Type 2 DM: Last A1c 8.8, today is 8.3. Her A1c had been steadily increasing. Her blood sugars are not reflective of this A1c. I asked her to check post-prandial blood sugars last visit, which she has done. Blood sugars throughout the day are consistently in the 90s-140s after meals. Her pre-meal blood sugars last visit were also in good range. She is not on any diabetes medications. Denies symptoms of polyuria, polyphagia, polydipsia, and blurred vision.   Right Shoulder Pain: She reports her right shoulder pain has not changed since last visit. She reports worse pain at night. She has been using 2 pillows to support her shoulder which helps relieve the pain. She also takes Tylenol 500 mg once daily about 3 times per week "when the pain is really bad." She notes she has extreme pain when she forgets about her shoulder and goes to reach for something high up.    Review of Systems General: Denies fever, chills, night sweats, changes in weight, changes in appetite HEENT: Denies headaches, ear pain, changes in vision, rhinorrhea, sore throat CV: Denies CP, palpitations, SOB, orthopnea Pulm: Denies SOB, cough, wheezing GI: Denies abdominal pain, nausea, vomiting, diarrhea, constipation, melena, hematochezia GU: Denies dysuria, hematuria, frequency Msk: Denies muscle cramps Neuro: Denies weakness, numbness, tingling Skin: Denies rashes, bruising Psych: Denies depression, anxiety, hallucinations   Objective:   Physical Exam General: alert, sitting up, NAD HEENT: [place]/AT, EOMI, sclera anicteric, mucus membranes moist CV: RRR, no m/g/r Pulm: CTA bilaterally, breaths non-labored Abd: BS+, soft, non-tender Ext: warm, no peripheral edema. ROM limited to 90 degrees in R shoulder. Pain elicited with passive abduction of her R shoulder.  Neuro: alert and oriented x 3    Assessment & Plan:  Please refer to A&P documentation.

## 2016-01-28 NOTE — Assessment & Plan Note (Signed)
Her A1c continues to be elevated despite her blood sugars being under fairly good control without any medication. Her hemoglobin was normal last visit so anemia not contributing to elevated A1c. I will discuss with her about starting Metformin next visit to see if we can bring her A1c down. I think with her CKD it would be beneficial to get optimal control of her blood sugars/A1c as she is doing well for her age. I will also advise her to stop checking her blood sugars as this will reduce costs and is not necessary given she is currently not on any medications. Follow up in 3 months.

## 2016-01-28 NOTE — Patient Instructions (Signed)
General Instructions: - Increase Lisinopril to 20 mg daily. I have sent in the new correct prescription. Take double the dose right now with your current prescription. - Referral to sports medicine placed for a steroid injection with your shoulder. - Take Tylenol 500 mg daily for now to help your pain. You can always increase the Tylenol to 2-3 tablets if needed.  Thank you for bringing your medicines today. This helps [place] keep you safe from mistakes.   Progress Toward Treatment Goals:  Treatment Goal 06/04/2015  Hemoglobin A1C deteriorated  Blood pressure improved    Self Care Goals & Plans:  Self Care Goal 01/28/2016  Manage my medications take my medicines as prescribed; bring my medications to every visit; refill my medications on time  Monitor my health keep track of my blood pressure; bring my glucose meter and log to each visit; keep track of my blood glucose  Eat healthy foods eat more vegetables; eat foods that are low in salt; eat baked foods instead of fried foods  Be physically active find an activity I enjoy    Home Blood Glucose Monitoring 06/04/2015  Check my blood sugar 2 times a day  When to check my blood sugar before meals     Care Management & Community Referrals:  Referral 04/24/2013  Referrals made for care management support none needed

## 2016-01-28 NOTE — Assessment & Plan Note (Signed)
Her right shoulder pain symptoms seem most consistent with a rotator cuff tendinopathy vs subacromial bursitis. I advised her to try at least Tylenol 500 mg daily. She is hesitant about "taking too many medications."  Recommended to increase to Tylenol 2-3 tablets daily if her pain is not adequately controlled with a single tablet daily. Will try this conservative measure first and if she continues to have pain can consider a steroid injection. However, she did seem hesitant about this option and physical therapy as well.

## 2016-01-28 NOTE — Assessment & Plan Note (Addendum)
Patient does not have osteopenia as her lumbar spine T score is 0.5 and her left femur T score is -0.8 (both normal). I do not think a repeat DEXA scan is necessary as her initial one was normal. Will discuss with her next visit that she does not need to take calcium and vitamin D supplementation, but can simply eat a well-balanced diet. I will remove this diagnosis from her problem list.

## 2016-01-28 NOTE — Assessment & Plan Note (Signed)
BP mildly elevated today. She did not increase her Lisinopril to 20 mg daily. I advised her to finish up her current Lisinopril 10 mg bottle by taking double the dose and then refill the Lisinopril 20 mg daily prescription. Continue HCTZ 12.5 mg daily and Amlodipine 10 mg daily. Follow up BP at next visit in 3 months.

## 2016-02-03 ENCOUNTER — Telehealth: Payer: Self-pay | Admitting: *Deleted

## 2016-02-03 NOTE — Telephone Encounter (Signed)
Opened in error trying to f/u referral - no phone call made at this time Yvonna Alanis, RN, 02/03/16 - 22:56P

## 2016-02-03 NOTE — Progress Notes (Signed)
Internal Medicine Clinic Attending  Case discussed with Dr. Rivet soon after the resident saw the patient.  We reviewed the resident's history and exam and pertinent patient test results.  I agree with the assessment, diagnosis, and plan of care documented in the resident's note.  

## 2016-02-07 ENCOUNTER — Encounter: Payer: Self-pay | Admitting: Sports Medicine

## 2016-02-07 ENCOUNTER — Ambulatory Visit (INDEPENDENT_AMBULATORY_CARE_PROVIDER_SITE_OTHER): Payer: Medicare Other | Admitting: Sports Medicine

## 2016-02-07 VITALS — BP 156/87 | HR 81 | Ht 64.0 in | Wt 184.0 lb

## 2016-02-07 DIAGNOSIS — M25511 Pain in right shoulder: Secondary | ICD-10-CM

## 2016-02-07 MED ORDER — METHYLPREDNISOLONE ACETATE 40 MG/ML IJ SUSP
40.0000 mg | Freq: Once | INTRAMUSCULAR | Status: AC
  Administered 2016-02-07: 40 mg via INTRA_ARTICULAR
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
## 2016-02-29 ENCOUNTER — Other Ambulatory Visit: Payer: Self-pay | Admitting: Internal Medicine

## 2016-03-09 ENCOUNTER — Ambulatory Visit: Payer: Medicare Other | Admitting: Sports Medicine

## 2016-03-16 ENCOUNTER — Encounter: Payer: Self-pay | Admitting: Sports Medicine

## 2016-03-16 ENCOUNTER — Ambulatory Visit (INDEPENDENT_AMBULATORY_CARE_PROVIDER_SITE_OTHER): Payer: Medicare Other | Admitting: Sports Medicine

## 2016-03-16 VITALS — BP 155/93 | Ht 64.0 in | Wt 184.0 lb

## 2016-03-16 DIAGNOSIS — M25511 Pain in right shoulder: Secondary | ICD-10-CM | POA: Diagnosis not present

## 2016-03-16 NOTE — Progress Notes (Signed)
   Subjective:    Patient ID: Gina Bailey, female    DOB: [birth year], 70 y.o.   MRN: [id number]  HPI   Patient comes in today for follow-up on right shoulder pain. Subacromial cortisone injection has helped her tremendously. She has regained nearly full motion. She still gets occasional twinges of pain in the posterior shoulder with internal rotation but she has noticed this is becoming less and less frequent. She has also noticed increasing strength in her right arm.    Review of Systems     Objective:   Physical Exam  Well-developed, well-nourished. No acute distress  Right shoulder: Full range of motion. 4+5 strength with resisted supraspinatus. 5/5 strength with resisted external rotation and internal rotation. No tenderness to palpation. Neurovascularly intact distally.      Assessment & Plan:   Improved right shoulder pain likely secondary to rotator cuff tear  Given her overall improvement, I will hold on further workup at this time. If her pain returns, I would consider further diagnostic imaging. We could also repeat a cortisone injection if needed. Follow-up as needed.

## 2016-03-26 ENCOUNTER — Ambulatory Visit (INDEPENDENT_AMBULATORY_CARE_PROVIDER_SITE_OTHER): Payer: Medicare Other | Admitting: *Deleted

## 2016-03-26 DIAGNOSIS — Z23 Encounter for immunization: Secondary | ICD-10-CM

## 2016-04-28 ENCOUNTER — Ambulatory Visit (INDEPENDENT_AMBULATORY_CARE_PROVIDER_SITE_OTHER): Payer: Medicare Other | Admitting: Internal Medicine

## 2016-04-28 ENCOUNTER — Encounter: Payer: Self-pay | Admitting: Internal Medicine

## 2016-04-28 VITALS — BP 135/83 | HR 80 | Temp 98.2°F | Ht 64.0 in | Wt 184.0 lb

## 2016-04-28 DIAGNOSIS — I10 Essential (primary) hypertension: Secondary | ICD-10-CM

## 2016-04-28 DIAGNOSIS — E119 Type 2 diabetes mellitus without complications: Secondary | ICD-10-CM

## 2016-04-28 DIAGNOSIS — M25511 Pain in right shoulder: Secondary | ICD-10-CM

## 2016-04-28 DIAGNOSIS — E1159 Type 2 diabetes mellitus with other circulatory complications: Secondary | ICD-10-CM

## 2016-04-28 DIAGNOSIS — Z79899 Other long term (current) drug therapy: Secondary | ICD-10-CM

## 2016-04-28 DIAGNOSIS — I152 Hypertension secondary to endocrine disorders: Secondary | ICD-10-CM

## 2016-04-28 DIAGNOSIS — Z87891 Personal history of nicotine dependence: Secondary | ICD-10-CM

## 2016-04-28 LAB — GLUCOSE, CAPILLARY: Glucose-Capillary: 103 mg/dL — ABNORMAL HIGH (ref 65–99)

## 2016-04-28 LAB — POCT GLYCOSYLATED HEMOGLOBIN (HGB A1C): Hemoglobin A1C: 7.7

## 2016-04-28 MED ORDER — LISINOPRIL 20 MG PO TABS: 20.0000 mg | ORAL_TABLET | Freq: Every day | ORAL | 5 refills | Status: DC

## 2016-04-28 NOTE — Assessment & Plan Note (Signed)
A1c improved from 8.3 to 7.7 today. Patient hesitant about going on Metformin as she does not like taking medications. I think it is appropriate to try diet modifications first. She has agreed to see [name] for nutrition education and make some dietary changes over the next 3 months and if her A1c is still elevated at her next visit she will consider trying Metformin. We discussed the risks of prolonged elevated blood sugars, including retinopathy, nephropathy, neuropathy, etc. She wishes to continue testing her blood sugars despite this not being necessary. I do not think this is causing any harm and allows her to feel involved in her diabetes care. Will have her follow up in 3 months at which point we will assess how her dietary changes have affected her A1c.

## 2016-04-28 NOTE — Assessment & Plan Note (Signed)
BP at goal on repeat measure. Continue Amlodipine 10 mg daily, HCTZ 12.5 mg daily, and Lisinopril 20 mg daily. If BP becomes elevated in future can consider increasing HCTZ.

## 2016-04-28 NOTE — Patient Instructions (Signed)
General Instructions: - You can stop checking your blood sugars if you choose to do so.  - Please work on your diet and cutting down on juices and eating more vegetables. If we can get your blood sugars down by diet alone over the next 3 months then we will not need to start Metformin. However, if your A1c is still elevated in 3 months then we will need to try Metformin - You can stop taking calcium and vitamin D supplements once you run out as your bones were normal on your DEXA scan  Thank you for bringing your medicines today. This helps [place] keep you safe from mistakes.   Progress Toward Treatment Goals:  Treatment Goal 06/04/2015  Hemoglobin A1C deteriorated  Blood pressure improved    Self Care Goals & Plans:  Self Care Goal 04/28/2016  Manage my medications take my medicines as prescribed; bring my medications to every visit; refill my medications on time  Monitor my health keep track of my blood pressure; bring my glucose meter and log to each visit; keep track of my blood glucose; check my feet daily  Eat healthy foods eat more vegetables; eat foods that are low in salt; eat baked foods instead of fried foods  Be physically active find an activity I enjoy    Home Blood Glucose Monitoring 06/04/2015  Check my blood sugar 2 times a day  When to check my blood sugar before meals     Care Management & Community Referrals:  Referral 04/24/2013  Referrals made for care management support none needed

## 2016-04-28 NOTE — Progress Notes (Signed)
   CC: Diabetes  HPI:  [name] is a 70 y.o. woman with PMHx as noted below who presents today for follow up of her diabetes.  Type 2 DM: Last A1c 8.3 and had gradually been trending up overall. Today her A1c is 7.7. She is currently not on any medications for her diabetes as she is diet controlled. We discussed the possibility of starting Metformin but she is hesitant about this and would like to try to work on her diet over the next 3 months. She states she can cut down on the amount of juice she is drinking and incorporate more vegetables into her diet.   HTN: BP 146/86 today. On repeat was 135/83. She is taking Amlodipine 10 mg daily, HCTZ 12.5 mg daily, and Lisinopril 20 mg daily.   Right Shoulder Pain: Reports her pain is significantly better after receiving a steroid injection in July at Dr. Alinda Money. She reports sometimes having "slight twinges" when the weather gets cold but this is relived with Tylenol.   Past Medical History:  Diagnosis Date  . Diabetes mellitus    Diet-controlled. Not on antiglycemic medications.  Marland Kitchen HLD (hyperlipidemia)   . Hypertension   . Hypokalemia    recurrent  . Seasonal allergies   . Ventral hernia    Incisional ventral hernia. S/P repair by Dr. Hulen Skains  (08/2008)  . Ventricular hypertrophy 05/2006   LVH on ECG (05/2006) - Likely 2/2 HTN.  . 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 Hulen Skains. (08/2006)    Review of Systems:  All systems reviewed and negative except per HPI.  Physical Exam:  Vitals:   04/28/16 1325  BP: (!) 146/86  Pulse: 90  Temp: 98.2 F (36.8 C)  TempSrc: Oral  SpO2: 97%  Weight: 184 lb (83.5 kg)  Height: 5\' 4"  (1.626 m)   Repeat BP 135/83  General: elderly woman sitting up, pleasant, NAD HEENT: [place]/AT, EOMI, sclera anicteric, mucus membranes moist CV: RRR, no m/g/r Pulm: CTA bilaterally, breaths non-labored Abd: BS+, soft, non-tender Ext: warm, no  peripheral edema Neuro: alert and oriented x 3  Assessment & Plan:   See Encounters Tab for problem based charting.  Patient discussed with Dr. Dareen Piano

## 2016-04-28 NOTE — Assessment & Plan Note (Signed)
Pain well controlled after steroid injection. She was advised by Dr. Micheline Chapman to return in a few months for repeat injection if the pain returns.

## 2016-04-29 NOTE — Progress Notes (Signed)
Internal Medicine Clinic Attending  Case discussed with Dr. Rivet soon after the resident saw the patient.  We reviewed the resident's history and exam and pertinent patient test results.  I agree with the assessment, diagnosis, and plan of care documented in the resident's note.  

## 2016-05-04 ENCOUNTER — Encounter: Payer: Self-pay | Admitting: Internal Medicine

## 2016-06-03 NOTE — Addendum Note (Signed)
Addended by: Hulan Fray on: 06/03/2016 06:38 PM   Modules accepted: Orders

## 2016-07-05 ENCOUNTER — Other Ambulatory Visit: Payer: Self-pay | Admitting: Internal Medicine

## 2016-07-05 DIAGNOSIS — E1159 Type 2 diabetes mellitus with other circulatory complications: Secondary | ICD-10-CM

## 2016-07-05 DIAGNOSIS — I10 Essential (primary) hypertension: Principal | ICD-10-CM

## 2016-07-06 ENCOUNTER — Other Ambulatory Visit: Payer: Self-pay

## 2016-07-07 ENCOUNTER — Ambulatory Visit
Admission: RE | Admit: 2016-07-07 | Discharge: 2016-07-07 | Disposition: A | Discharge status: Home / Self Care | Payer: Medicare Other | Source: Ambulatory Visit | Attending: Sports Medicine | Admitting: Sports Medicine

## 2016-07-07 ENCOUNTER — Ambulatory Visit (INDEPENDENT_AMBULATORY_CARE_PROVIDER_SITE_OTHER): Payer: Medicare Other | Admitting: Sports Medicine

## 2016-07-07 ENCOUNTER — Other Ambulatory Visit: Payer: Self-pay | Admitting: Internal Medicine

## 2016-07-07 VITALS — BP 160/90 | Ht 64.0 in | Wt 184.0 lb

## 2016-07-07 DIAGNOSIS — M25511 Pain in right shoulder: Secondary | ICD-10-CM

## 2016-07-07 DIAGNOSIS — I152 Hypertension secondary to endocrine disorders: Secondary | ICD-10-CM

## 2016-07-07 DIAGNOSIS — E119 Type 2 diabetes mellitus without complications: Secondary | ICD-10-CM

## 2016-07-07 DIAGNOSIS — I10 Essential (primary) hypertension: Secondary | ICD-10-CM

## 2016-07-07 DIAGNOSIS — E1159 Type 2 diabetes mellitus with other circulatory complications: Secondary | ICD-10-CM

## 2016-07-07 MED ORDER — METHYLPREDNISOLONE ACETATE 40 MG/ML IJ SUSP
40.0000 mg | Freq: Once | INTRAMUSCULAR | Status: AC
  Administered 2016-07-07: 40 mg via INTRA_ARTICULAR

## 2016-07-07 MED ORDER — POTASSIUM CHLORIDE ER 20 MEQ PO TBCR: 1.0000 | EXTENDED_RELEASE_TABLET | Freq: Every day | ORAL | 5 refills | Status: DC

## 2016-07-07 MED ORDER — ACCU-CHEK FASTCLIX LANCETS MISC: 5 refills | Status: DC

## 2016-07-07 MED ORDER — SIMVASTATIN 20 MG PO TABS: 20.0000 mg | ORAL_TABLET | Freq: Every day | ORAL | 1 refills | Status: DC

## 2016-07-07 NOTE — Progress Notes (Signed)
   Subjective:    Patient ID: Gina Bailey, female    DOB: [birth year], 70 y.o.   MRN: [id number]  HPI chief complaint: Right shoulder pain  Patient comes in today with returning right shoulder pain. She was seen in our office back in August. A subacromial cortisone injection administered during that visit resulted in complete resolution of her shoulder pain up until about 3 weeks ago. Her pain has now returned. It is diffuse throughout the shoulder. She also thinks she is getting some swelling in the area. No numbness or tingling. Pain is worse with any type of shoulder motion. She is having difficulty sleeping at night as well.      Review of Systems    as above Objective:   Physical Exam  Well-developed, well-nourished. No acute distress.  Right shoulder: Patient has good passive range of motion but limited active range of motion in all planes. No soft tissue swelling. No tenderness to palpation. Global weakness secondary to pain. Neurovascularly intact distally.      Assessment & Plan:   Returning right shoulder pain likely secondary to rotator cuff tendinopathy versus subacromial bursitis versus osteoarthritis  I recommend a repeat subacromial cortisone injection today. Patient agrees. She tolerates this without difficulty. I would like to get x-rays of her right shoulder and have her follow-up with me in 2 weeks. We may need to consider merits of further diagnostic imaging if she continues to have pain and if the shoulder x-rays are unremarkable.  Consent obtained and verified. Time-out conducted. Noted no overlying erythema, induration, or other signs of local infection. Skin prepped in a sterile fashion. Topical analgesic spray: Ethyl chloride. Joint: right shoulder (subacromial) Needle: 25g 1.5 inches Completed without difficulty. Meds: 3cc 1% xylocaine, 1cc (40mg ) depomedrol  Advised to call if fevers/chills, erythema, induration, drainage, or persistent  bleeding.

## 2016-07-07 NOTE — Telephone Encounter (Signed)
Patient requesting Refills    ACCU-CHEK FASTCLIX LANCETS MISC Use one lancet each time to prick your finger to test your blood sugar., Normal Last Dose: Not Recorded   Pharmacy: [place], [place] - [address]      amLODipine (NORVASC) 10 MG tablet  Potassium Chloride ER 20 MEQ TBCR TAKE ONE TABLET BY MOUTH DAILY, Normal Last Dose: Not Recorded  Pharmacy: [place], [place] - [address]   simvastatin (ZOCOR) 20 MG tablet TAKE ONE TABLET BY MOUTH ONCE DAILY

## 2016-07-21 ENCOUNTER — Other Ambulatory Visit: Payer: Self-pay

## 2016-07-21 ENCOUNTER — Other Ambulatory Visit: Payer: Self-pay | Admitting: Internal Medicine

## 2016-07-21 DIAGNOSIS — Z1231 Encounter for screening mammogram for malignant neoplasm of breast: Secondary | ICD-10-CM

## 2016-07-21 NOTE — Telephone Encounter (Signed)
Informed pt - Simvastatin and Potassium were refilled 0n 07/07/16 and sent to her pharmacy. Stated she will call [facility].

## 2016-07-21 NOTE — Telephone Encounter (Signed)
simvastatin (ZOCOR) 20 MG tablet, Potassium Chloride ER 20 MEQ TBCR, Refill request @ [facility] on [address]

## 2016-07-22 ENCOUNTER — Ambulatory Visit (INDEPENDENT_AMBULATORY_CARE_PROVIDER_SITE_OTHER): Payer: Medicare Other | Admitting: Sports Medicine

## 2016-07-22 ENCOUNTER — Encounter: Payer: Self-pay | Admitting: Sports Medicine

## 2016-07-22 VITALS — BP 146/90 | Ht 64.0 in | Wt 184.0 lb

## 2016-07-22 DIAGNOSIS — M25511 Pain in right shoulder: Secondary | ICD-10-CM | POA: Diagnosis not present

## 2016-07-22 MED ORDER — IBUPROFEN 600 MG PO TABS: ORAL_TABLET | ORAL | 0 refills | Status: DC

## 2016-07-24 NOTE — Progress Notes (Signed)
   Subjective:    Patient ID: Gina Bailey, female    DOB: 01/29/1946, 71 y.o.   MRN: [id number]  HPI   Patient comes in today for follow-up on right shoulder pain. Overall, her pain has improved dramatically after a repeat subacromial cortisone injection. This was her second injection done over the past several months. Still has intermittent pain with reaching directly overhead or around behind her back but it is much better than before. Recent x-ray of her right shoulder showed some acromioclavicular joint arthropathy and mild glenohumeral DJD but nothing acute.    Review of Systems    as above Objective:   Physical Exam  Well-developed, well-nourished. No acute distress  Right shoulder: Full active and passive range of motion. No tenderness to palpation. Mildly positive empty can, mildly positive [place]. Rotator cuff strength is 5/5. Neurovascularly intact distally.  X-rays of the right shoulder are as above      Assessment & Plan:   Improved right shoulder pain secondary to subacromial bursitis/rotator cuff tendinopathy  Patient is doing much better. I've given her a prescription for ibuprofen 600 mg with instructions to take it twice daily as needed for food. If symptoms return, I would consider merits of further diagnostic imaging. Follow-up for ongoing or recalcitrant issues.

## 2016-08-17 ENCOUNTER — Ambulatory Visit
Admission: RE | Admit: 2016-08-17 | Discharge: 2016-08-17 | Disposition: A | Discharge status: Home / Self Care | Payer: Medicare Other | Source: Ambulatory Visit | Attending: *Deleted | Admitting: *Deleted

## 2016-08-17 DIAGNOSIS — Z1231 Encounter for screening mammogram for malignant neoplasm of breast: Secondary | ICD-10-CM

## 2016-08-24 ENCOUNTER — Telehealth: Payer: Self-pay | Admitting: Internal Medicine

## 2016-08-24 ENCOUNTER — Other Ambulatory Visit: Payer: Self-pay | Admitting: Internal Medicine

## 2016-08-24 NOTE — Telephone Encounter (Signed)
APT. REMINDER CALL, LMTCB °

## 2016-08-25 ENCOUNTER — Encounter: Payer: Self-pay | Admitting: Internal Medicine

## 2016-08-25 ENCOUNTER — Ambulatory Visit (INDEPENDENT_AMBULATORY_CARE_PROVIDER_SITE_OTHER): Payer: Medicare Other | Admitting: Internal Medicine

## 2016-08-25 VITALS — BP 147/82 | HR 93 | Temp 98.3°F | Ht 64.0 in | Wt 184.1 lb

## 2016-08-25 DIAGNOSIS — I10 Essential (primary) hypertension: Secondary | ICD-10-CM

## 2016-08-25 DIAGNOSIS — Z79899 Other long term (current) drug therapy: Secondary | ICD-10-CM

## 2016-08-25 DIAGNOSIS — E1159 Type 2 diabetes mellitus with other circulatory complications: Secondary | ICD-10-CM | POA: Diagnosis not present

## 2016-08-25 DIAGNOSIS — E119 Type 2 diabetes mellitus without complications: Secondary | ICD-10-CM

## 2016-08-25 DIAGNOSIS — E1169 Type 2 diabetes mellitus with other specified complication: Secondary | ICD-10-CM | POA: Diagnosis not present

## 2016-08-25 DIAGNOSIS — E1165 Type 2 diabetes mellitus with hyperglycemia: Secondary | ICD-10-CM

## 2016-08-25 DIAGNOSIS — E784 Other hyperlipidemia: Secondary | ICD-10-CM

## 2016-08-25 DIAGNOSIS — E785 Hyperlipidemia, unspecified: Secondary | ICD-10-CM

## 2016-08-25 DIAGNOSIS — I152 Hypertension secondary to endocrine disorders: Secondary | ICD-10-CM

## 2016-08-25 DIAGNOSIS — Z87891 Personal history of nicotine dependence: Secondary | ICD-10-CM

## 2016-08-25 LAB — POCT GLYCOSYLATED HEMOGLOBIN (HGB A1C): Hemoglobin A1C: 8.1

## 2016-08-25 LAB — GLUCOSE, CAPILLARY: GLUCOSE-CAPILLARY: 139 mg/dL — AB (ref 65–99)

## 2016-08-25 MED ORDER — LISINOPRIL 40 MG PO TABS: 40.0000 mg | ORAL_TABLET | Freq: Every day | ORAL | 5 refills | Status: DC

## 2016-08-25 NOTE — Assessment & Plan Note (Addendum)
BP has remained borderline for the last few visits. Will increase her Lisinopril to 40 mg daily. Will have her follow up in 2 weeks for BP recheck and bmet. Continue HCTZ 12.5 mg daily and Amlodipine 10 mg daily.

## 2016-08-25 NOTE — Patient Instructions (Signed)
General Instructions: - Increase Lisinopril to 40 mg daily for your blood pressure - Please contact your dietician to come up with a good eating plan so we can get your blood sugars down - If your blood sugars continue to go up we will need to consider medication to help this - Try walking 20 minutes every day - Cut out juice and carbohydrates (crackers, cereal, bread, biscuits, sweets, etc) - Restart calcium and vitamin D   Thank you for bringing your medicines today. This helps [place] keep you safe from mistakes.   Progress Toward Treatment Goals:  Treatment Goal 06/04/2015  Hemoglobin A1C deteriorated  Blood pressure improved    Self Care Goals & Plans:  Self Care Goal 04/28/2016  Manage my medications take my medicines as prescribed; bring my medications to every visit; refill my medications on time  Monitor my health keep track of my blood pressure; bring my glucose meter and log to each visit; keep track of my blood glucose; check my feet daily  Eat healthy foods eat more vegetables; eat foods that are low in salt; eat baked foods instead of fried foods  Be physically active find an activity I enjoy    Home Blood Glucose Monitoring 06/04/2015  Check my blood sugar 2 times a day  When to check my blood sugar before meals     Care Management & Community Referrals:  Referral 04/24/2013  Referrals made for care management support none needed

## 2016-08-25 NOTE — Assessment & Plan Note (Signed)
Stable. Continue Simvastatin 20 mg daily.

## 2016-08-25 NOTE — Progress Notes (Signed)
   CC: Hypertension   HPI:  Gina Bailey is a 71 y.o. woman with PMHx as noted below who presents today for follow up of her hypertension.  HTN: BP 147/82 today. She is taking Amlodipine 10 mg daily, HCTZ 12.5 mg daily, and Lisinopril 20 mg daily.   Type 2 DM: Last A1c 7.7. Today her A1c is 8.1. She has tried making dietary changes by avoiding canned soup and eating more vegetables. She reports she has worked with a Automotive engineer that is provided by her [facility]. We discussed that she would benefit from Metformin therapy but patient is adamant that she does not want to take medication for her diabetes. She reports hearing that Metformin is "bad for you" from friends/family. She would like to try making additional dietary changes.   Hyperlipidemia:  She is taking Simvastatin 20 mg daily. Denies any muscle cramps or intolerances.  Past Medical History:  Diagnosis Date  . Diabetes mellitus    Diet-controlled. Not on antiglycemic medications.  Marland Kitchen HLD (hyperlipidemia)   . Hypertension   . Hypokalemia    recurrent  . Seasonal allergies   . Ventral hernia    Incisional ventral hernia. S/P repair by Dr. Hulen Skains  (08/2008)  . Ventricular hypertrophy 05/2006   LVH on ECG (05/2006) - Likely 2/2 HTN.  . 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 Hulen Skains. (08/2006)    Review of Systems:   General: Denies fever, chills, night sweats, changes in weight, changes in appetite HEENT: Denies headaches, ear pain, changes in vision,  sore throat CV: Denies CP, palpitations, SOB, orthopnea Pulm: Denies SOB, cough, wheezing GI: Denies abdominal pain, nausea, vomiting, diarrhea, constipation, melena, hematochezia GU: Denies dysuria, hematuria, frequency Msk: Denies muscle cramps, joint pains Neuro: Denies weakness, numbness, tingling Skin: Denies rashes, bruising Psych: Denies depression, anxiety, hallucinations  Physical Exam:  Vitals:   08/25/16 1324  BP: (!) 147/82  Pulse: 93  Temp: 98.3 F (36.8 C)  TempSrc: Oral  SpO2: 97%  Weight: 184 lb 1.6 oz (83.5 kg)  Height: 5\' 4"  (1.626 m)   General: Elderly woman sitting up in chair, NAD HEENT: [place]/AT, EOMI, sclera anicteric, mucus membranes moist CV: RRR, no m/g/r Pulm: CTA bilaterally, breaths non-labored Ext: moves all extremities, no peripheral edema Neuro: alert and oriented x 3  Assessment & Plan:   See Encounters Tab for problem based charting.  Patient discussed with Dr. Dareen Piano

## 2016-08-25 NOTE — Assessment & Plan Note (Signed)
A1c continues to rise (has gone from 7.7 to 8.1). Patient very hesitant about starting antiglycemic agents. We discussed the risks of continued high blood sugars and how dietary changes alone may not be effective enough to get her blood sugars under control. She states she wants to try additional dietary modifications, including cutting out juices, rice, and other simple carbohydrates. I even offered alternative options to Metformin but patient still refused to try any medications for her diabetes. She has agreed to make dietary changes and reconsider medications if her A1c does not improve at her next visit.

## 2016-08-26 NOTE — Progress Notes (Signed)
Internal Medicine Clinic Attending  Case discussed with Dr. Rivet at the time of the visit.  We reviewed the resident's history and exam and pertinent patient test results.  I agree with the assessment, diagnosis, and plan of care documented in the resident's note.  

## 2016-08-31 ENCOUNTER — Telehealth: Payer: Self-pay | Admitting: *Deleted

## 2016-08-31 ENCOUNTER — Ambulatory Visit (INDEPENDENT_AMBULATORY_CARE_PROVIDER_SITE_OTHER): Payer: Medicare Other | Admitting: Pulmonary Disease

## 2016-08-31 ENCOUNTER — Encounter: Payer: Self-pay | Admitting: Pulmonary Disease

## 2016-08-31 VITALS — BP 143/89 | HR 101 | Temp 98.2°F | Wt 184.7 lb

## 2016-08-31 DIAGNOSIS — K0889 Other specified disorders of teeth and supporting structures: Secondary | ICD-10-CM

## 2016-08-31 DIAGNOSIS — Z87891 Personal history of nicotine dependence: Secondary | ICD-10-CM

## 2016-08-31 DIAGNOSIS — M25512 Pain in left shoulder: Secondary | ICD-10-CM

## 2016-08-31 DIAGNOSIS — I10 Essential (primary) hypertension: Secondary | ICD-10-CM

## 2016-08-31 DIAGNOSIS — E1159 Type 2 diabetes mellitus with other circulatory complications: Secondary | ICD-10-CM

## 2016-08-31 DIAGNOSIS — T783XXA Angioneurotic edema, initial encounter: Secondary | ICD-10-CM | POA: Diagnosis not present

## 2016-08-31 MED ORDER — HYDROCHLOROTHIAZIDE 25 MG PO TABS: 25.0000 mg | ORAL_TABLET | Freq: Every day | ORAL | 1 refills | Status: DC

## 2016-08-31 MED ORDER — ATENOLOL 25 MG PO TABS: 25.0000 mg | ORAL_TABLET | Freq: Every day | ORAL | 1 refills | Status: DC

## 2016-08-31 MED ORDER — DIPHENHYDRAMINE HCL 25 MG PO CAPS
25.0000 mg | ORAL_CAPSULE | Freq: Once | ORAL | Status: AC
  Administered 2016-08-31: 25 mg via ORAL

## 2016-08-31 MED ORDER — FAMOTIDINE 20 MG PO TABS: 20.0000 mg | ORAL_TABLET | Freq: Two times a day (BID) | ORAL | 0 refills | Status: DC

## 2016-08-31 NOTE — Assessment & Plan Note (Signed)
Assessment: BP above goal today at 143/89  Plan:  Increase HCTZ to 25mg  daily Continue amlodipine 10mg  daily Add atenolol 25mg  daily Follow up in 4-6 weeks. Repeat BMP at that time.

## 2016-08-31 NOTE — Progress Notes (Signed)
   CC: facial swelling  HPI:  [name] is a 71 y.o. woman with history as noted below presenting with facial swelling.  Her facial swelling started Sunday morning. She noticed the right cheek was very swollen. This spread to her upper lip and lower jaw. No tongue or throat swelling. Some itching of her upper lip. She last took her lisinopril Sunday morning. She has noted some left shoulder pain that started this morning. She has not taken any medication for it. No recent injury or trauma to her left shoulder   Past Medical History:  Diagnosis Date  . Diabetes mellitus    Diet-controlled. Not on antiglycemic medications.  Marland Kitchen HLD (hyperlipidemia)   . Hypertension   . Hypokalemia    recurrent  . Seasonal allergies   . Ventral hernia    Incisional ventral hernia. S/P repair by Dr. Hulen Skains  (08/2008)  . Ventricular hypertrophy 05/2006   LVH on ECG (05/2006) - Likely 2/2 HTN.  . 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 Hulen Skains. (08/2006)    Review of Systems:   No fevers or chills No chest pain  Physical Exam:  Vitals:   08/31/16 1116 08/31/16 1121  BP: (!) 167/93 (!) 143/89  Pulse: (!) 115 (!) 101  Temp: 98.2 F (36.8 C)   TempSrc: Oral   SpO2: 99%   Weight: 184 lb 11.2 oz (83.8 kg)    General Apperance: NAD HEENT: swelling of both lips, upper>lower, some lower facial swelling, oropharynx clear Neck: Supple, trachea midline Lungs: Clear to auscultation bilaterally. No wheezes, rhonchi or rales. Breathing comfortably on room air Heart: Regular rate and rhythm, no murmur/rub/gallop Abdomen: Soft, nontender, nondistended, no rebound/guarding Extremities: Warm and well perfused, no edema. Tender to palpation posterior and lateral left shoulder. +empty can and cross arm Skin: No rashes or lesions Neurologic: Alert and interactive. No gross deficits.  Assessment & Plan:   See Encounters Tab for problem based  charting.  Patient discussed with Dr. Angelia Mould

## 2016-08-31 NOTE — Assessment & Plan Note (Addendum)
Facial swelling consistent with angioedema. No throat or tongue involvement. Has been improving. Observed for 5 hours.   Stop lisinopril  ADDENDUM: Pt with unchanged swelling but overall remains improved from Sunday. She was given Benadryl 25mg  x 1. Will discharge her on famotidine 20mg  BID and can use benadryl OTC as needed. Return precautions discussed. Also asked her to see a dentist as she has poor dentition.

## 2016-08-31 NOTE — Assessment & Plan Note (Signed)
Previously had right shoulder pain that xr demonstrated degenerative changes. Improved with injection and ibuprofen. Acute left shoulder pain today without injury or trauma to it. She has not tried ibuprofen for it.  Plan:  Ibuprofen 600mg  BID prn Can use warm compresses Follow up in 4-6 weeks

## 2016-08-31 NOTE — Patient Instructions (Addendum)
Stop taking lisinopril Take famotidine twice a day until your swelling goes away Please make an appointment to see a dentist You may take benadryl as needed  Take 2 tablets (25mg ) of hydrochlorothiazide daily until you run out of your current prescription. When you have it refilled, you can go back to 1 tablet daily.  Start taking atenolol daily for your blood pressure  Please call if your symptoms worsen or fail to improve. Follow up in 4 weeks   Angioedema Angioedema is sudden swelling in the body. The swelling can happen in any part of the body. It often happens on the skin and causes itchy, bumpy patches (hives) to form. This condition may:  Happen only one time.  Happen more than one time. It may come back at random times.  Keep coming back for a number of years. Someday it may stop coming back. Follow these instructions at home:  Take over-the-counter and prescription medicines only as told by your doctor.  Avoid the things that cause your attacks (triggers). Contact a doctor if:  You have another attack.  Your attacks happen more often, even after you take steps to prevent them.  This condition was passed to you by your parents and you want to have kids. Get help right away if:  Your mouth, tongue, or lips get very swollen.  You have trouble breathing.  You have trouble swallowing.  You pass out (faint). This information is not intended to replace advice given to you by your health care provider. Make sure you discuss any questions you have with your health care provider. Document Released: 06/24/2009 Document Revised: 02/05/2016 Document Reviewed: 01/14/2016 Elsevier Interactive Patient Education  2017 [employer].

## 2016-08-31 NOTE — Telephone Encounter (Signed)
Patient called stating R sided facial swelling that started around 6:15 am then around 8 am, the L side of her face was swollen also. Denies SOB, denies swelling on other body areas. Does not recall taking anything new (meds of foods). Her L shoulder is sore.Appt w/ acc @ 10:45 today 2/12.

## 2016-09-01 NOTE — Progress Notes (Signed)
Internal Medicine Clinic Attending  I saw and evaluated the patient.  I personally confirmed the key portions of the history and exam documented by Dr. Randell Patient and I reviewed pertinent patient test results.  The assessment, diagnosis, and plan were formulated together and I agree with the documentation in the resident's note. On my exam her swelling is limited to her upper lip, she has no evidence of mouth floor or posterior oropharynx swelling, or tongue swelling.  We monitored her in the office for over 5 hours with no worsening of her symptoms.  She was given the option of observation admission if she did not feel that she could come back if worsening, however she did have a family member present who reported they would help monitor her overnight for worsening.  Therefore we had her discontinue lisinopril and allowed her to go home.  She will call EMS if any further swelling, if swelling has not resolved in next 24-36 hours but is not worsened she will call the clinic where we will reevaluate her  ASAP.

## 2016-09-07 ENCOUNTER — Telehealth: Payer: Self-pay | Admitting: *Deleted

## 2016-09-07 NOTE — Telephone Encounter (Signed)
Per Tracey,lab - need to call pt to check on "swelling" per Dr Randell Patient.  Called pt - stated swelling is much better; it not she will be calling us.    Pt informed she does not have to come for lab appt tomorrow. But needs f/u appt per Dr Randell Patient. Appt scheduled 4/3 with Dr Arcelia Jew.

## 2016-09-07 NOTE — Telephone Encounter (Signed)
Thanks

## 2016-09-08 ENCOUNTER — Other Ambulatory Visit: Payer: Medicare Other

## 2016-10-19 ENCOUNTER — Telehealth: Payer: Self-pay | Admitting: Internal Medicine

## 2016-10-19 NOTE — Telephone Encounter (Signed)
APT. REMINDER CALL, LMTCB °

## 2016-10-20 ENCOUNTER — Encounter (INDEPENDENT_AMBULATORY_CARE_PROVIDER_SITE_OTHER): Payer: Self-pay

## 2016-10-20 ENCOUNTER — Encounter: Payer: Self-pay | Admitting: Internal Medicine

## 2016-10-20 ENCOUNTER — Ambulatory Visit (INDEPENDENT_AMBULATORY_CARE_PROVIDER_SITE_OTHER): Payer: Medicare Other | Admitting: Internal Medicine

## 2016-10-20 VITALS — BP 131/86 | HR 76 | Temp 99.1°F | Ht 64.0 in | Wt 179.6 lb

## 2016-10-20 DIAGNOSIS — E1159 Type 2 diabetes mellitus with other circulatory complications: Secondary | ICD-10-CM | POA: Diagnosis not present

## 2016-10-20 DIAGNOSIS — Z87891 Personal history of nicotine dependence: Secondary | ICD-10-CM

## 2016-10-20 DIAGNOSIS — I152 Hypertension secondary to endocrine disorders: Secondary | ICD-10-CM | POA: Diagnosis not present

## 2016-10-20 DIAGNOSIS — I10 Essential (primary) hypertension: Principal | ICD-10-CM

## 2016-10-20 DIAGNOSIS — M25512 Pain in left shoulder: Secondary | ICD-10-CM

## 2016-10-20 DIAGNOSIS — Z79899 Other long term (current) drug therapy: Secondary | ICD-10-CM

## 2016-10-20 DIAGNOSIS — E119 Type 2 diabetes mellitus without complications: Secondary | ICD-10-CM

## 2016-10-20 LAB — GLUCOSE, CAPILLARY: GLUCOSE-CAPILLARY: 107 mg/dL — AB (ref 65–99)

## 2016-10-20 MED ORDER — ATENOLOL 25 MG PO TABS: 25.0000 mg | ORAL_TABLET | Freq: Every day | ORAL | 5 refills | Status: DC

## 2016-10-20 MED ORDER — HYDROCHLOROTHIAZIDE 25 MG PO TABS: 25.0000 mg | ORAL_TABLET | Freq: Every day | ORAL | 5 refills | Status: DC

## 2016-10-20 NOTE — Assessment & Plan Note (Signed)
Pain well controlled with ibuprofen. Can discuss PT and/or steroid injection if pain worsens or movement becomes restricted. Continue to monitor.

## 2016-10-20 NOTE — Assessment & Plan Note (Signed)
BP well controlled on her new regimen of Atenolol 25 mg daily, HCTZ 25 mg daily, and Amlodipine 10 mg daily. Atenolol and HCTZ prescriptions were refilled. Continue to monitor.

## 2016-10-20 NOTE — Patient Instructions (Signed)
General Instructions: - Refills made for blood pressure medications - Will have you follow up next month for diabetes - You are doing a great job with your diet changes and walking! Keep up the good work!  Thank you for bringing your medicines today. This helps [place] keep you safe from mistakes.   Progress Toward Treatment Goals:  Treatment Goal 06/04/2015  Hemoglobin A1C deteriorated  Blood pressure improved    Self Care Goals & Plans:  Self Care Goal 10/20/2016  Manage my medications take my medicines as prescribed; bring my medications to every visit; refill my medications on time  Monitor my health check my feet daily; keep track of my blood glucose; bring my glucose meter and log to each visit  Eat healthy foods eat more vegetables; eat foods that are low in salt; eat baked foods instead of fried foods  Be physically active find an activity I enjoy    Home Blood Glucose Monitoring 06/04/2015  Check my blood sugar 2 times a day  When to check my blood sugar before meals     Care Management & Community Referrals:  Referral 04/24/2013  Referrals made for care management support none needed

## 2016-10-20 NOTE — Progress Notes (Signed)
   CC: Hypertension follow up  HPI:  [name] is a 71 y.o. woman with PMHx as noted below who presents today for follow up of her hypertension.  HTN: BP today is controlled at 131/86. Her Lisinopril was discontinued last visit due to angioedema. She was started on Atenolol 25 mg daily and her HCTZ was increased to 25 mg daily. She is also on Amlodipine 10 mg daily.  Type 2 DM: Last A1c 8.1. We discussed starting oral antihyperglycemics at her last visit but patient has been hesitant. She reports eating more vegetables and cutting down on her carbohydrate intake (less rice and grits). She also has been walking for 1 hour at least 4 times a week. She is motivated to adjust her lifestyle instead of going on medication. Her blood sugars are well controlled, mostly in the 100-150 range, only a few in the 170s.   Left Shoulder Pain: Reports her left shoulder occasionally hurts. She has tried ibuprofen and notes this relieves her pain. She does not feel limited in her day to day life with the pain. She feels she is not as strong as before. She denies any tingling or numbness or associated neck pain.   Past Medical History:  Diagnosis Date  . Diabetes mellitus    Diet-controlled. Not on antiglycemic medications.  Marland Kitchen HLD (hyperlipidemia)   . Hypertension   . Hypokalemia    recurrent  . Seasonal allergies   . Ventral hernia    Incisional ventral hernia. S/P repair by Dr. Hulen Skains  (08/2008)  . Ventricular hypertrophy 05/2006   LVH on ECG (05/2006) - Likely 2/2 HTN.  . 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 Hulen Skains. (08/2006)    Review of Systems:   General: Denies fever, chills, night sweats, changes in weight, changes in appetite HEENT: Denies headaches, ear pain, changes in vision, rhinorrhea, sore throat CV: Denies CP, palpitations, SOB, orthopnea Pulm: Denies SOB, cough, wheezing GI: Denies abdominal pain, nausea, vomiting,  diarrhea, constipation, melena, hematochezia GU: Denies dysuria, hematuria, frequency Msk: Denies muscle cramps Neuro: See HPI Skin: Denies rashes, bruising Psych: Denies depression, anxiety, hallucinations  Physical Exam:  Vitals:   10/20/16 1506  BP: 131/86  Pulse: 76  Temp: 99.1 F (37.3 C)  TempSrc: Oral  SpO2: 98%  Weight: 179 lb 9.6 oz (81.5 kg)  Height: 5\' 4"  (1.626 m)   General: Elderly woman sitting up, NAD HEENT: [place]/AT, EOMI, sclera anicteric, mucus membranes moist CV: RRR, no m/g/r Pulm: CTA bilaterally, breaths non-labored Ext: no peripheral edema. ROM is full in the right arm/shoulder. Mild pain is elicited when abducting the right arm against resistance.  Neuro: alert and oriented x 3. Strength 5/5 in upper extremities.   Assessment & Plan:   See Encounters Tab for problem based charting.  Patient discussed with Dr. Eppie Gibson

## 2016-10-20 NOTE — Progress Notes (Signed)
Case discussed with Dr. Rivet at the time of the visit.  We reviewed the resident's history and exam and pertinent patient test results.  I agree with the assessment, diagnosis, and plan of care documented in the resident's note. 

## 2016-10-20 NOTE — Assessment & Plan Note (Signed)
Patient making great efforts to avoid going on medication for her diabetes. I encouraged her to keep up with these positive lifestyle changes. Will get a repeat A1c next month to assess her blood glucose control. She is open to the idea of starting antihyperglycemics if her HbA1c continues to be elevated.

## 2016-11-05 LAB — HM DIABETES EYE EXAM

## 2016-11-13 ENCOUNTER — Other Ambulatory Visit: Payer: Self-pay | Admitting: Oral Surgery

## 2016-11-17 ENCOUNTER — Ambulatory Visit (INDEPENDENT_AMBULATORY_CARE_PROVIDER_SITE_OTHER): Payer: Medicare Other | Admitting: Internal Medicine

## 2016-11-17 ENCOUNTER — Encounter: Payer: Self-pay | Admitting: Internal Medicine

## 2016-11-17 VITALS — BP 116/71 | HR 62 | Temp 98.6°F | Wt 176.3 lb

## 2016-11-17 DIAGNOSIS — Z87891 Personal history of nicotine dependence: Secondary | ICD-10-CM | POA: Diagnosis not present

## 2016-11-17 DIAGNOSIS — I10 Essential (primary) hypertension: Secondary | ICD-10-CM

## 2016-11-17 DIAGNOSIS — I152 Hypertension secondary to endocrine disorders: Secondary | ICD-10-CM

## 2016-11-17 DIAGNOSIS — E1159 Type 2 diabetes mellitus with other circulatory complications: Secondary | ICD-10-CM | POA: Diagnosis not present

## 2016-11-17 DIAGNOSIS — E119 Type 2 diabetes mellitus without complications: Secondary | ICD-10-CM

## 2016-11-17 DIAGNOSIS — K069 Disorder of gingiva and edentulous alveolar ridge, unspecified: Secondary | ICD-10-CM

## 2016-11-17 DIAGNOSIS — Z79899 Other long term (current) drug therapy: Secondary | ICD-10-CM | POA: Diagnosis not present

## 2016-11-17 LAB — POCT GLYCOSYLATED HEMOGLOBIN (HGB A1C): Hemoglobin A1C: 7.7

## 2016-11-17 LAB — GLUCOSE, CAPILLARY: GLUCOSE-CAPILLARY: 98 mg/dL (ref 65–99)

## 2016-11-17 NOTE — Progress Notes (Signed)
   CC: Follow up for diabetes  HPI:  [name] is a 71 y.o. woman with PMHx as noted below who presents today for follow up of her diabetes.  Type 2 DM: Last A1c 8.1. Today her A1c is 7.7. She has been hesitant to go on antihyperglycemic agents. She has been working very hard on losing weight in hopes to improve her blood sugars. She reports cutting out juices and sweets. She has lost 8 lbs over the last 2.5 months. Denies any blurry vision, polyuria, or polydipsia.  HTN: BP mildly elevated at 153/79. Repeat BP 116/79. She is taking Atenolol 25 mg daily, HCTZ 25 mg daily, and Amlodipine 10 mg daily.  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 Benna Dunks, last week. He was concerned about her gums, stating they were very enlarged and swollen and worried this could be a presentation for lymphoma. Dr. Benna Dunks referred the patient to Dr. Mancel Parsons ([facility]) for biopsy of her gingival tissue. The pathology showed densley inflamed and ulcerated squamous lined mucosa without evidence for malignancy. Patient reports she was given 2 antibiotics to take (Amoxicillin-clavulanate and Metronidazole) for a 1 week course and to use chlorhexidine mouth wash twice daily. She has a follow up appointment with Dr. Benna Dunks today and Dr. Mancel Parsons the following week. She denies any pain in her mouth.  Past Medical History:  Diagnosis Date  . Diabetes mellitus    Diet-controlled. Not on antiglycemic medications.  Marland Kitchen HLD (hyperlipidemia)   . Hypertension   . Hypokalemia    recurrent  . Seasonal allergies   . Ventral hernia    Incisional ventral hernia. S/P repair by Dr. Hulen Skains  (08/2008)  . Ventricular hypertrophy 05/2006   LVH on ECG (05/2006) - Likely 2/2 HTN.  . 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 Hulen Skains. (08/2006)    Review of Systems:   All negative except per HPI  Physical Exam:  Vitals:   11/17/16 1411  BP: (!) 153/79    Pulse: 72  Temp: 98.6 F (37 C)  TempSrc: Oral  SpO2: 99%  Weight: 176 lb 4.8 oz (80 kg)   General: Elderly woman in NAD HEENT: EOMI, sclera anicteric, gums swollen throughout CV: RRR, no m/g/r  Assessment & Plan:   See Encounters Tab for problem based charting.  Patient discussed with Dr. Angelia Mould

## 2016-11-17 NOTE — Patient Instructions (Signed)
General Instructions: - Very proud of you for losing weight! Keep up the great work. - We will check your A1c today. I will call you with result. If still high then we can discuss starting medication for your diabetes.   Thank you for bringing your medicines today. This helps [place] keep you safe from mistakes.   Progress Toward Treatment Goals:  Treatment Goal 06/04/2015  Hemoglobin A1C deteriorated  Blood pressure improved    Self Care Goals & Plans:  Self Care Goal 10/20/2016  Manage my medications take my medicines as prescribed; bring my medications to every visit; refill my medications on time  Monitor my health check my feet daily; keep track of my blood glucose; bring my glucose meter and log to each visit  Eat healthy foods eat more vegetables; eat foods that are low in salt; eat baked foods instead of fried foods  Be physically active find an activity I enjoy    Home Blood Glucose Monitoring 06/04/2015  Check my blood sugar 2 times a day  When to check my blood sugar before meals     Care Management & Community Referrals:  Referral 04/24/2013  Referrals made for care management support none needed

## 2016-11-18 DIAGNOSIS — K069 Disorder of gingiva and edentulous alveolar ridge, unspecified: Secondary | ICD-10-CM | POA: Insufficient documentation

## 2016-11-18 HISTORY — DX: Disorder of gingiva and edentulous alveolar ridge, unspecified: K06.9

## 2016-11-18 NOTE — Assessment & Plan Note (Signed)
Her A1c has improved from 8.1 to 7.7 with her weight loss efforts. Will discuss with patient that she can go on an antiglycemic agent but she has been resistant to this in the past. Encouraged her to continue with her weight loss goals and hopefully she can avoid going on medication for diabetes. She will follow up in 3 months.

## 2016-11-18 NOTE — Assessment & Plan Note (Addendum)
Pathology confirmed no malignant process. No need for further work up. She started her antibiotic regimen (Amoxicillin-clavulanate and Metronidazole) on 4/26 and will complete a 1 week course. She has close follow up with Dr. Benna Dunks and Dr. Mancel Parsons. Appreciate their care for this patient.

## 2016-11-18 NOTE — Assessment & Plan Note (Signed)
BP well controlled on repeat. Will continue current regimen.

## 2016-11-23 NOTE — Progress Notes (Signed)
Internal Medicine Clinic Attending  Case discussed with Dr. Rivet at the time of the visit.  We reviewed the resident's history and exam and pertinent patient test results.  I agree with the assessment, diagnosis, and plan of care documented in the resident's note.  

## 2016-11-24 ENCOUNTER — Encounter: Payer: Medicare Other | Admitting: Internal Medicine

## 2016-11-24 ENCOUNTER — Telehealth: Payer: Self-pay | Admitting: Internal Medicine

## 2016-11-24 NOTE — Telephone Encounter (Signed)
Discussed with patient that can hold off on starting antiglycemic medication as A1c decreased, but if A1c starts to rise again above 8.0 that she will need to start Metformin. She has been working very hard on losing weight and has been successful the past few months. She is agreeable to this plan.

## 2016-12-24 ENCOUNTER — Encounter: Payer: Self-pay | Admitting: *Deleted

## 2016-12-29 ENCOUNTER — Encounter: Payer: Self-pay | Admitting: *Deleted

## 2017-01-23 ENCOUNTER — Other Ambulatory Visit: Payer: Self-pay | Admitting: Internal Medicine

## 2017-02-05 ENCOUNTER — Ambulatory Visit (INDEPENDENT_AMBULATORY_CARE_PROVIDER_SITE_OTHER): Payer: Medicare Other | Admitting: Internal Medicine

## 2017-02-05 ENCOUNTER — Encounter: Payer: Self-pay | Admitting: Internal Medicine

## 2017-02-05 VITALS — BP 159/88 | HR 84 | Temp 98.3°F | Ht 64.0 in | Wt 182.7 lb

## 2017-02-05 DIAGNOSIS — Z79899 Other long term (current) drug therapy: Secondary | ICD-10-CM | POA: Diagnosis not present

## 2017-02-05 DIAGNOSIS — E1159 Type 2 diabetes mellitus with other circulatory complications: Secondary | ICD-10-CM | POA: Diagnosis not present

## 2017-02-05 DIAGNOSIS — E119 Type 2 diabetes mellitus without complications: Secondary | ICD-10-CM

## 2017-02-05 DIAGNOSIS — Z87891 Personal history of nicotine dependence: Secondary | ICD-10-CM | POA: Diagnosis not present

## 2017-02-05 DIAGNOSIS — I152 Hypertension secondary to endocrine disorders: Secondary | ICD-10-CM | POA: Diagnosis not present

## 2017-02-05 DIAGNOSIS — I10 Essential (primary) hypertension: Secondary | ICD-10-CM

## 2017-02-05 LAB — POCT GLYCOSYLATED HEMOGLOBIN (HGB A1C): HEMOGLOBIN A1C: 8.1

## 2017-02-05 LAB — GLUCOSE, CAPILLARY: Glucose-Capillary: 113 mg/dL — ABNORMAL HIGH (ref 65–99)

## 2017-02-05 NOTE — Progress Notes (Signed)
Medicine attending: I personally interviewed and briefly examined this patient on the day of the patient visit and reviewed pertinent clinical and ,laboratory data  with resident physician Dr. Tawny Asal and we discussed a management plan. We reviewed data over last 4 years. Diabetes & BP control reasonable. She agreed to try to lose weight she has gained since last visit rather than going on a new med. Renal function stable through 2017. Repeat today pending. Increased microalbumin/creatinine ratio rising over time. Angioedema in past on ACE. We would rec ARB if she agrees.

## 2017-02-05 NOTE — Patient Instructions (Addendum)
It was nice to meet you today [name]. Thank you for bringing in your medicines.   Overall you're doing great! Your A1c crept up a little from 7.7 last time to 8.1 today. Keep working to eat healthy and exercise to bring that number down for your next visit. We checked some lab work today and will let you know if anything comes back abnormal.

## 2017-02-05 NOTE — Assessment & Plan Note (Addendum)
The pt has had success with lifestyle modifications to lower her A1c, last visit was 7.7, today 8.1, bg 113. Blood sugar readings from home meter show average of 133, 95% within target range. She has been hesitant to start any diabetes medication in the past and continues to want to manage her diabetes with diet and exercise. She has gained 6 lbs since her last visit. She was encouraged to re-focus on maintaining a healthy diet and exercising regularly to lower her A1c. Will continue to hold off on medications per pt preference. Given her age, her goal A1c may also not need to be the typical <7. She is up to date on retinal and foot exams.

## 2017-02-05 NOTE — Assessment & Plan Note (Addendum)
The pt has been tolerating her current regimen of amlodipine 10 mg, atenolol 25 mg, and HCTZ 25 mg. BP today is initally 149/86, remained elevated on recheck. She did have a lower reading of 986 systolic on recheck at last visit, and we will hold off on making changes today. Though she has had an episode of angioedema in the past, an ARB may be considered at f/u with a low risk of recurrence of angioedema and better renal protection than her current regimen. Check BMP today to eval electrolytes and Cr.   -Continue current regimen -BMP

## 2017-02-05 NOTE — Progress Notes (Signed)
   CC: HTN and DM follow up  HPI:  Gina Bailey is a 71 y.o. F with past medical history as detailed below who presents to the clinic for follow up of her HTN and DM.   She has been controlling her diabetes with diet and exercise with last A1c 7.7. She states she has continued to try to eat healthy with less meats, sweets, biscuits, and more fresh vegetables in her diet. She has tried to stay active working in her garden this summer.   She has been tolerating her current BP regimen and other chronic medications without issue. Denies chest pain, SOB, light-headedness, muscle cramps. She has a small amount of pedal edema today in clinic which she states occurs intermittently after being on her feet and resolves spontaneously.   Past Medical History:  Diagnosis Date  . Diabetes mellitus    Diet-controlled. Not on antiglycemic medications.  Marland Kitchen HLD (hyperlipidemia)   . Hypertension   . Hypokalemia    recurrent  . Seasonal allergies   . Ventral hernia    Incisional ventral hernia. S/P repair by Dr. Hulen Skains  (08/2008)  . Ventricular hypertrophy 05/2006   LVH on ECG (05/2006) - Likely 2/2 HTN.  . 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 Hulen Skains. (08/2006)   Review of Systems:  Review of Systems  Constitutional: Negative for fever.  Respiratory: Negative for shortness of breath.   Cardiovascular: Negative for chest pain.  Gastrointestinal: Negative for abdominal pain.  Neurological: Negative for dizziness.     Physical Exam:  Vitals:   02/05/17 1433  BP: (!) 149/86  Pulse: 84  Temp: 98.3 F (36.8 C)  TempSrc: Oral  SpO2: 98%  Weight: 182 lb 11.2 oz (82.9 kg)  Height: 5\' 4"  (1.626 m)   Physical Exam  Constitutional: She appears well-developed and well-nourished.  Cardiovascular: Normal rate and regular rhythm.   Pulmonary/Chest: Effort normal and breath sounds normal.  Abdominal: Soft. There is no tenderness.    Musculoskeletal:  Bilateral pedal edema   Neurological: No sensory deficit.  Skin: Skin is warm and dry. Capillary refill takes less than 2 seconds.    Assessment & Plan:   See Encounters Tab for problem based charting.  Patient seen with Dr. Beryle Beams

## 2017-02-06 LAB — BMP8+ANION GAP
Anion Gap: 17 mmol/L (ref 10.0–18.0)
BUN / CREAT RATIO: 13 (ref 12–28)
BUN: 17 mg/dL (ref 8–27)
CALCIUM: 10.3 mg/dL (ref 8.7–10.3)
CHLORIDE: 103 mmol/L (ref 96–106)
CO2: 27 mmol/L (ref 20–29)
CREATININE: 1.36 mg/dL — AB (ref 0.57–1.00)
GFR calc Af Amer: 45 mL/min/{1.73_m2} — ABNORMAL LOW (ref >59)
GFR calc non Af Amer: 39 mL/min/{1.73_m2} — ABNORMAL LOW (ref >59)
Glucose: 113 mg/dL — ABNORMAL HIGH (ref 65–99)
Potassium: 3.5 mmol/L (ref 3.5–5.2)
Sodium: 147 mmol/L — ABNORMAL HIGH (ref 134–144)

## 2017-03-18 ENCOUNTER — Other Ambulatory Visit: Payer: Self-pay | Admitting: *Deleted

## 2017-03-18 DIAGNOSIS — E1159 Type 2 diabetes mellitus with other circulatory complications: Secondary | ICD-10-CM

## 2017-03-18 DIAGNOSIS — I10 Essential (primary) hypertension: Principal | ICD-10-CM

## 2017-03-18 MED ORDER — HYDROCHLOROTHIAZIDE 25 MG PO TABS: 25.0000 mg | ORAL_TABLET | Freq: Every day | ORAL | 4 refills | Status: DC

## 2017-05-11 ENCOUNTER — Encounter: Payer: Self-pay | Admitting: Internal Medicine

## 2017-05-11 ENCOUNTER — Encounter (INDEPENDENT_AMBULATORY_CARE_PROVIDER_SITE_OTHER): Payer: Self-pay

## 2017-05-11 ENCOUNTER — Ambulatory Visit (INDEPENDENT_AMBULATORY_CARE_PROVIDER_SITE_OTHER): Payer: Medicare Other | Admitting: Internal Medicine

## 2017-05-11 VITALS — BP 134/84 | HR 86 | Temp 97.9°F | Ht 64.0 in | Wt 179.8 lb

## 2017-05-11 DIAGNOSIS — E119 Type 2 diabetes mellitus without complications: Secondary | ICD-10-CM | POA: Diagnosis not present

## 2017-05-11 DIAGNOSIS — I152 Hypertension secondary to endocrine disorders: Secondary | ICD-10-CM

## 2017-05-11 DIAGNOSIS — I10 Essential (primary) hypertension: Secondary | ICD-10-CM | POA: Diagnosis not present

## 2017-05-11 DIAGNOSIS — J301 Allergic rhinitis due to pollen: Secondary | ICD-10-CM | POA: Diagnosis not present

## 2017-05-11 DIAGNOSIS — E1159 Type 2 diabetes mellitus with other circulatory complications: Secondary | ICD-10-CM

## 2017-05-11 DIAGNOSIS — Z23 Encounter for immunization: Secondary | ICD-10-CM | POA: Diagnosis not present

## 2017-05-11 LAB — POCT GLYCOSYLATED HEMOGLOBIN (HGB A1C): HEMOGLOBIN A1C: 7.3

## 2017-05-11 LAB — GLUCOSE, CAPILLARY: Glucose-Capillary: 128 mg/dL — ABNORMAL HIGH (ref 65–99)

## 2017-05-11 MED ORDER — ATENOLOL 25 MG PO TABS: 25.0000 mg | ORAL_TABLET | Freq: Every day | ORAL | 5 refills | Status: DC

## 2017-05-11 MED ORDER — LOSARTAN POTASSIUM 50 MG PO TABS: 50.0000 mg | ORAL_TABLET | Freq: Every day | ORAL | 3 refills | Status: DC

## 2017-05-11 NOTE — Assessment & Plan Note (Signed)
In the interval since her last visit, she has had improved glycemic control with re-focusing on her lifestyle management of her DM. Her A1c has improved to 7.3 today and she continues to prefer to avoid medications for her diabetes. She was encouraged to continue the healthy eating habits and to continue to find ways to exercise, especially as the weather becomes colder and yard work may be more difficult. She has also had a modest 3 lb weight reduction. Continue to monitor

## 2017-05-11 NOTE — Progress Notes (Signed)
   CC: Follow-up of diabetes  HPI:  [name] is a 71 y.o. female with past medical history as described below who presents to the clinic for follow-up of her diabetes.  DM: Pt checks her blood sugar daily, has tried to improve dietary habits since last visit. She has eaten less biscuits as an example. She continues to work in her yard for exercise and also feels this has helped slightly decrease her weight. Denies sx or readings of hypoglycemia.   HTN: Denies headache, blurred vision, dizziness, or near syncope. She requests a refill for her atenolol.   Pt also notes rhinorrhea and sneezing this morning. She feels this may be the start of a URI though she notes a history of sinus issues in the past, currently on allegra. She denies sore throat, cough, fever.    Past Medical History:  Diagnosis Date  . Diabetes mellitus    Diet-controlled. Not on antiglycemic medications.  Marland Kitchen HLD (hyperlipidemia)   . Hypertension   . Hypokalemia    recurrent  . Seasonal allergies   . Ventral hernia    Incisional ventral hernia. S/P repair by Dr. Hulen Skains  (08/2008)  . Ventricular hypertrophy 05/2006   LVH on ECG (05/2006) - Likely 2/2 HTN.  . 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 Hulen Skains. (08/2006)   Review of Systems:  Review of Systems  Constitutional: Negative for chills and fever.  HENT: Negative for sore throat.   Respiratory: Negative for cough.   Neurological: Negative for dizziness and headaches.     Physical Exam:  Vitals:   05/11/17 1323  BP: (!) 165/94  Pulse: (!) 106  Temp: 97.9 F (36.6 C)  TempSrc: Oral  SpO2: 99%  Weight: 179 lb 12.8 oz (81.6 kg)  Height: 5\' 4"  (1.626 m)   Physical Exam  Constitutional: She is oriented to person, place, and time. She appears well-developed and well-nourished.  HENT:  Mouth/Throat: Oropharynx is clear and moist.  No pharyngeal erythema or exudate, no tenderness to palpation  of maxillary or frontal sinuses   Eyes: Pupils are equal, round, and reactive to light. Conjunctivae are normal.  Cardiovascular: Normal rate and regular rhythm.   Pulmonary/Chest: Effort normal and breath sounds normal.  Lymphadenopathy:    She has no cervical adenopathy.  Neurological: She is alert and oriented to person, place, and time.    Assessment & Plan:   See Encounters Tab for problem based charting.  Patient seen with Dr. Lynnae January

## 2017-05-11 NOTE — Patient Instructions (Signed)
It was good to see you again today Gina Bailey.  In order to help your kidneys and protect them we are going to start a medicine called losartan 50 mg that you take once a day.  We are going to check your labs today and in 1 week to make sure everything looks good before and after you start the medicine.  We also refilled atenolol today.  You are all set with your pneumonia vaccine based on our records and we also gave you the flu vaccine today.  Your Allegra may help your runny nose and sneezing, and hopefully it will not progress.

## 2017-05-11 NOTE — Assessment & Plan Note (Signed)
History of seasonal allergies noted and pt currently prescribed allegra. Notes increased rhinorrhea starting this morning. At this early point and with a benign exam, it is difficult to distinguish if her current sx are allergy related or an early viral URI. Pt encouraged to continue allegra and, if her sx develop further, can use OTCs for symptom relief

## 2017-05-11 NOTE — Assessment & Plan Note (Signed)
Pt tolerating current regimen well. Her initial BP today was elevated to 165/94, modest improvement on recheck. She also has paperwork from an insurance home visit with BP reading in 140s. Labs obtained 7/20 showed stable electrolytes, though her kidney function has worsened since 2017 with Cr of 1.36, GFR 45, CKD III, likely as a result of chronic co-morbidities of HTN and DM. She was previously on lisinopril but this was discontinued after angioedema (limited to upper lip swelling). Given her elevated values and declining kidney function, tighter BP control with ARB for additional renal protection is preferred. The pt is hesitant with medication additions, but was agreeable after reviewing kidney function trend and benefits.   --Start Losartan 50 mg daily --BMP today and lab visit in 1 wk for repeat BMP  --Continue amlodipine, atenolol, HCTZ

## 2017-05-12 ENCOUNTER — Telehealth: Payer: Self-pay | Admitting: Internal Medicine

## 2017-05-12 LAB — BMP8+ANION GAP
Anion Gap: 15 mmol/L (ref 10.0–18.0)
BUN / CREAT RATIO: 13 (ref 12–28)
BUN: 15 mg/dL (ref 8–27)
CHLORIDE: 99 mmol/L (ref 96–106)
CO2: 31 mmol/L — AB (ref 20–29)
Calcium: 10.7 mg/dL — ABNORMAL HIGH (ref 8.7–10.3)
Creatinine, Ser: 1.19 mg/dL — ABNORMAL HIGH (ref 0.57–1.00)
GFR calc Af Amer: 52 mL/min/{1.73_m2} — ABNORMAL LOW (ref >59)
GFR calc non Af Amer: 45 mL/min/{1.73_m2} — ABNORMAL LOW (ref >59)
GLUCOSE: 114 mg/dL — AB (ref 65–99)
POTASSIUM: 3.7 mmol/L (ref 3.5–5.2)
SODIUM: 145 mmol/L — AB (ref 134–144)

## 2017-05-12 NOTE — Telephone Encounter (Signed)
Patient wanted to let the nurse staff know that she picked up her medicine and she is going to start taking the medicine tomorrow,  It will be 1 week to come in to get labs

## 2017-05-19 NOTE — Progress Notes (Signed)
Internal Medicine Clinic Attending  I saw and evaluated the patient.  I personally confirmed the key portions of the history and exam documented by Dr. Harden and I reviewed pertinent patient test results.  The assessment, diagnosis, and plan were formulated together and I agree with the documentation in the resident's note.  

## 2017-05-20 ENCOUNTER — Other Ambulatory Visit (INDEPENDENT_AMBULATORY_CARE_PROVIDER_SITE_OTHER): Payer: Medicare Other

## 2017-05-20 DIAGNOSIS — I10 Essential (primary) hypertension: Principal | ICD-10-CM

## 2017-05-20 DIAGNOSIS — I152 Hypertension secondary to endocrine disorders: Secondary | ICD-10-CM | POA: Diagnosis not present

## 2017-05-20 DIAGNOSIS — E1159 Type 2 diabetes mellitus with other circulatory complications: Secondary | ICD-10-CM | POA: Diagnosis not present

## 2017-05-21 LAB — BMP8+ANION GAP
ANION GAP: 18 mmol/L (ref 10.0–18.0)
BUN/Creatinine Ratio: 13 (ref 12–28)
BUN: 15 mg/dL (ref 8–27)
CHLORIDE: 100 mmol/L (ref 96–106)
CO2: 27 mmol/L (ref 20–29)
CREATININE: 1.14 mg/dL — AB (ref 0.57–1.00)
Calcium: 10.1 mg/dL (ref 8.7–10.3)
GFR calc Af Amer: 55 mL/min/{1.73_m2} — ABNORMAL LOW (ref >59)
GFR calc non Af Amer: 48 mL/min/{1.73_m2} — ABNORMAL LOW (ref >59)
GLUCOSE: 159 mg/dL — AB (ref 65–99)
POTASSIUM: 3.8 mmol/L (ref 3.5–5.2)
SODIUM: 145 mmol/L — AB (ref 134–144)

## 2017-07-09 ENCOUNTER — Other Ambulatory Visit: Payer: Self-pay | Admitting: Internal Medicine

## 2017-07-09 DIAGNOSIS — Z1231 Encounter for screening mammogram for malignant neoplasm of breast: Secondary | ICD-10-CM

## 2017-07-22 ENCOUNTER — Other Ambulatory Visit: Payer: Self-pay | Admitting: Internal Medicine

## 2017-08-10 ENCOUNTER — Other Ambulatory Visit: Payer: Self-pay | Admitting: *Deleted

## 2017-08-10 DIAGNOSIS — I10 Essential (primary) hypertension: Principal | ICD-10-CM

## 2017-08-10 DIAGNOSIS — I152 Hypertension secondary to endocrine disorders: Secondary | ICD-10-CM

## 2017-08-10 DIAGNOSIS — E1159 Type 2 diabetes mellitus with other circulatory complications: Secondary | ICD-10-CM

## 2017-08-10 MED ORDER — AMLODIPINE BESYLATE 10 MG PO TABS: 10.0000 mg | ORAL_TABLET | Freq: Every day | ORAL | 3 refills | Status: DC

## 2017-08-20 ENCOUNTER — Ambulatory Visit
Admission: RE | Admit: 2017-08-20 | Discharge: 2017-08-20 | Disposition: A | Discharge status: Home / Self Care | Payer: Medicare Other | Source: Ambulatory Visit | Attending: Internal Medicine | Admitting: Internal Medicine

## 2017-08-20 DIAGNOSIS — Z1231 Encounter for screening mammogram for malignant neoplasm of breast: Secondary | ICD-10-CM

## 2017-09-14 ENCOUNTER — Other Ambulatory Visit: Payer: Self-pay

## 2017-09-14 ENCOUNTER — Encounter: Payer: Self-pay | Admitting: Internal Medicine

## 2017-09-14 ENCOUNTER — Ambulatory Visit: Payer: Medicare Other | Admitting: Internal Medicine

## 2017-09-14 VITALS — BP 143/92 | HR 74 | Temp 98.2°F | Ht 65.0 in | Wt 184.8 lb

## 2017-09-14 DIAGNOSIS — R239 Unspecified skin changes: Secondary | ICD-10-CM

## 2017-09-14 DIAGNOSIS — E119 Type 2 diabetes mellitus without complications: Secondary | ICD-10-CM

## 2017-09-14 DIAGNOSIS — I152 Hypertension secondary to endocrine disorders: Secondary | ICD-10-CM

## 2017-09-14 DIAGNOSIS — R140 Abdominal distension (gaseous): Secondary | ICD-10-CM

## 2017-09-14 DIAGNOSIS — R209 Unspecified disturbances of skin sensation: Secondary | ICD-10-CM | POA: Diagnosis not present

## 2017-09-14 DIAGNOSIS — E1159 Type 2 diabetes mellitus with other circulatory complications: Secondary | ICD-10-CM

## 2017-09-14 DIAGNOSIS — K029 Dental caries, unspecified: Secondary | ICD-10-CM

## 2017-09-14 DIAGNOSIS — Z79899 Other long term (current) drug therapy: Secondary | ICD-10-CM | POA: Diagnosis not present

## 2017-09-14 DIAGNOSIS — E739 Lactose intolerance, unspecified: Secondary | ICD-10-CM | POA: Diagnosis not present

## 2017-09-14 DIAGNOSIS — I10 Essential (primary) hypertension: Secondary | ICD-10-CM

## 2017-09-14 LAB — POCT GLYCOSYLATED HEMOGLOBIN (HGB A1C): HEMOGLOBIN A1C: 7.8

## 2017-09-14 LAB — GLUCOSE, CAPILLARY: Glucose-Capillary: 159 mg/dL — ABNORMAL HIGH (ref 65–99)

## 2017-09-14 NOTE — Progress Notes (Signed)
   CC: Diabetes follow up   HPI:  Gina Bailey is a 72 y.o. F with a past medical history as described below who presents to the clinic for follow up diabetes and other chronic medical conditions.   DM: She feels she is doing well with her diabetes. She has not been able to work in her yard due to the poor weather. She tries to eat healthy but does report she drinks fruit juices like grape, apple juice. Her weight has increased since last visit. Again reiterates that she would like to avoid medications.   Abd Bloating: Notes abdominal bloating and gassiness which occurs after drinking milk. She has tried watering down milk, and did have one brand that had improved sx.   HTN: Has been taking medications as prescribed, no adverse reaction noted to losartan.   Skin Crawling sensation: Reports a sensation of skin crawling over her shoulders which occurs intermittently every few days, brief periods. No overlying skin changes, no associated pain.   Past Medical History:  Diagnosis Date  . Diabetes mellitus    Diet-controlled. Not on antiglycemic medications.  Marland Kitchen HLD (hyperlipidemia)   . Hypertension   . Hypokalemia    recurrent  . Seasonal allergies   . Ventral hernia    Incisional ventral hernia. S/P repair by Dr. Hulen Skains  (08/2008)  . Ventricular hypertrophy 05/2006   LVH on ECG (05/2006) - Likely 2/2 HTN.  . 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 Hulen Skains. (08/2006)   Review of Systems:  Review of Systems  Constitutional: Negative for fever.  Gastrointestinal:       Bloating  Musculoskeletal: Negative for myalgias.  Skin: Negative for rash.  Neurological: Negative for dizziness and headaches.     Physical Exam:  Vitals:   09/14/17 1402  BP: (!) 143/92  Pulse: 74  Temp: 98.2 F (36.8 C)  TempSrc: Oral  Weight: 184 lb 12.8 oz (83.8 kg)  Height: 5\' 5"  (1.651 m)   General: Sitting in chair comfortably, no acute  distress HEENT: Dental caries, moist mucus membranes  CV: RRR, no murmur appreciated  Resp: Clear breath sounds bilaterally, normal work of breathing, no distress  Extr: Foot exam performed with pulses faint with some pedal edema but palpable, sensation intact in all areas tested.  Neuro: Alert and oriented x3 Skin: Warm, dry      Assessment & Plan:   See Encounters Tab for problem based charting.  Patient discussed with Dr. Angelia Mould

## 2017-09-14 NOTE — Patient Instructions (Signed)
Good to see you today Gina Bailey.  Your A1c came up a little bit, so be sure to refocus on eating well, he can try to limit fruit juices these can have sugar in them.  If your A1c continues to go up and gets above 8, we may have to reconsider medications.  Before your gas/bloating, we can often get more lactose intolerant as we get older.  Decreasing the amount of milk you drink can help your symptoms or trying a different types of milk that are lactose-free were made from other foods like soy can help.  For your skin crawling, it may be helpful to try to keep a paper or journal and write down the times that you have these symptoms and what sort of things you have eaten or come into contact with just before then.  I will see you back in 3 months for checkup, let us know if you need any refills or anything before then.

## 2017-09-15 ENCOUNTER — Encounter: Payer: Self-pay | Admitting: Internal Medicine

## 2017-09-15 DIAGNOSIS — R239 Unspecified skin changes: Secondary | ICD-10-CM | POA: Insufficient documentation

## 2017-09-15 DIAGNOSIS — E739 Lactose intolerance, unspecified: Secondary | ICD-10-CM | POA: Insufficient documentation

## 2017-09-15 HISTORY — DX: Unspecified skin changes: R23.9

## 2017-09-15 NOTE — Assessment & Plan Note (Signed)
Her A1c has increased, up to 7.8 from 7.3 at last check. Her weight has also increased compared to prior and likely reflects back sliding with diet and exercise in the interval. She again wants to avoid medications and feels with the better weather and increased opportunity for exercise her glycemic control will improve. Also advised to decrease consumption of fruit juices that are likely high in sugar. Continue to monitor

## 2017-09-15 NOTE — Assessment & Plan Note (Signed)
Notes non-specific sensation of skin crawling which happens intermittently for brief periods and resolves spontaneously. No overlying skin changes, associated pain, or other sx. Will continue to monitor, pt advised to keep journal of sx and when they occur to try to uncover more information.

## 2017-09-15 NOTE — Assessment & Plan Note (Signed)
Her BP has improved and is 143/92, just over goal. She has tolerated addition of Losartan well which she was very apprehensive about given her history of minor angioedema with lisinopril. Her BMP remained stable after initiation of losartan. Will continue current regimen and if she remains consistently above goal, can consider increasing losartan dose   --Cont Losartan, amlodipine, atenolol, HCTZ

## 2017-09-15 NOTE — Assessment & Plan Note (Signed)
Reports abdominal bloating and gas type pain after drinking milk consistent with lactose intolerance. She does not wish to try alternate forms of milk such as soy, almond but will look for lactose free milk at the grocery store.

## 2017-09-16 NOTE — Progress Notes (Signed)
Internal Medicine Clinic Attending  Case discussed with Dr. Harden at the time of the visit.  We reviewed the resident's history and exam and pertinent patient test results.  I agree with the assessment, diagnosis, and plan of care documented in the resident's note.  

## 2017-09-30 ENCOUNTER — Telehealth: Payer: Self-pay

## 2017-09-30 DIAGNOSIS — I10 Essential (primary) hypertension: Principal | ICD-10-CM

## 2017-09-30 DIAGNOSIS — E1159 Type 2 diabetes mellitus with other circulatory complications: Secondary | ICD-10-CM

## 2017-09-30 MED ORDER — CANDESARTAN CILEXETIL 8 MG PO TABS: 8.0000 mg | ORAL_TABLET | Freq: Every day | ORAL | 3 refills | Status: DC

## 2017-09-30 NOTE — Telephone Encounter (Signed)
I converted her ARB to candesartan 8 mg daily and sent the prescription to her pharmacy.  Please notify the patient.  Thank You.

## 2017-09-30 NOTE — Telephone Encounter (Signed)
Pt states she received a letter from [facility] stating her losartan has been recalled, she needs something ordered to replace it, she is returning the remainder and they will reimburse her

## 2017-09-30 NOTE — Telephone Encounter (Signed)
Needs to speak with a nurse about recalled on losartan (COZAAR) 50 MG tablet. Please call pt back.

## 2017-10-01 NOTE — Telephone Encounter (Signed)
Attempted to call pt, lm for rtc

## 2017-10-12 NOTE — Telephone Encounter (Signed)
Lm rtc

## 2017-10-19 NOTE — Telephone Encounter (Signed)
Lm for rtc 

## 2017-11-02 ENCOUNTER — Other Ambulatory Visit: Payer: Self-pay | Admitting: Internal Medicine

## 2017-11-02 DIAGNOSIS — E1159 Type 2 diabetes mellitus with other circulatory complications: Secondary | ICD-10-CM

## 2017-11-02 DIAGNOSIS — E119 Type 2 diabetes mellitus without complications: Secondary | ICD-10-CM

## 2017-11-02 DIAGNOSIS — I152 Hypertension secondary to endocrine disorders: Secondary | ICD-10-CM

## 2017-11-02 DIAGNOSIS — I10 Essential (primary) hypertension: Secondary | ICD-10-CM

## 2017-11-02 MED ORDER — ACCU-CHEK FASTCLIX LANCETS MISC: 5 refills | Status: DC

## 2017-11-02 MED ORDER — ATENOLOL 25 MG PO TABS: 25.0000 mg | ORAL_TABLET | Freq: Every day | ORAL | 2 refills | Status: DC

## 2017-11-02 NOTE — Telephone Encounter (Signed)
Confirmed with [facility] Pharmacist patient has refills on test strips and 90 day supply of amlodipine. He will get these ready for her. Will send request for lancets and atenolol to PCP. Hubbard Hartshorn, RN, BSN

## 2017-11-02 NOTE — Telephone Encounter (Signed)
Patient is requesting a refills atenolol 25mg , accu-chec fastclix lancets test strips and amlodipine 10mg , patient is going on a trip and she will be gone for at least 45days and she wont like to have enough to last until she returns for next appt. Pls send to [facility] on [address]

## 2017-11-11 LAB — HM DIABETES EYE EXAM

## 2017-11-22 ENCOUNTER — Encounter: Payer: Self-pay | Admitting: *Deleted

## 2017-11-30 ENCOUNTER — Encounter: Payer: Medicare Other | Admitting: Internal Medicine

## 2017-12-28 ENCOUNTER — Encounter: Payer: Self-pay | Admitting: Internal Medicine

## 2017-12-28 ENCOUNTER — Ambulatory Visit: Payer: Medicare Other | Admitting: Internal Medicine

## 2017-12-28 ENCOUNTER — Other Ambulatory Visit: Payer: Self-pay

## 2017-12-28 ENCOUNTER — Encounter (INDEPENDENT_AMBULATORY_CARE_PROVIDER_SITE_OTHER): Payer: Self-pay

## 2017-12-28 VITALS — BP 145/82 | HR 74 | Temp 98.3°F | Ht 65.0 in | Wt 184.6 lb

## 2017-12-28 DIAGNOSIS — I10 Essential (primary) hypertension: Secondary | ICD-10-CM

## 2017-12-28 DIAGNOSIS — R911 Solitary pulmonary nodule: Secondary | ICD-10-CM | POA: Diagnosis not present

## 2017-12-28 DIAGNOSIS — Z794 Long term (current) use of insulin: Secondary | ICD-10-CM | POA: Diagnosis not present

## 2017-12-28 DIAGNOSIS — I152 Hypertension secondary to endocrine disorders: Secondary | ICD-10-CM

## 2017-12-28 DIAGNOSIS — E1159 Type 2 diabetes mellitus with other circulatory complications: Secondary | ICD-10-CM

## 2017-12-28 DIAGNOSIS — E119 Type 2 diabetes mellitus without complications: Secondary | ICD-10-CM

## 2017-12-28 LAB — GLUCOSE, CAPILLARY: GLUCOSE-CAPILLARY: 156 mg/dL — AB (ref 65–99)

## 2017-12-28 LAB — POCT GLYCOSYLATED HEMOGLOBIN (HGB A1C): Hemoglobin A1C: 8.5 % — AB (ref 4.0–5.6)

## 2017-12-28 MED ORDER — CANDESARTAN CILEXETIL 8 MG PO TABS: 16.0000 mg | ORAL_TABLET | Freq: Every day | ORAL | 3 refills | Status: DC

## 2017-12-28 NOTE — Patient Instructions (Signed)
Nice to see you again Gina Bailey.   Your blood pressure is still a little high and we are going to increase the dose of your new blood pressure medicine.  Now, take 2 pills each day instead of 1.  When it is time for  refill, please call the clinic and we will adjust the strength of the tablet.  Your A1c is still going up and is now above 8.  He will be very important to try to make dietary changes to bring your A1c and blood sugar down.  If your A1c does not start to decrease, I think a medication to help your blood sugar would be a benefit. We also put in a referral to speak with [name] to talk about foods to eat and avoid to help your diabetes.

## 2017-12-28 NOTE — Progress Notes (Signed)
   CC: Diabetes follow up   HPI:  [name] is a 72 y.o. F with a past medical history as described below who presents to the clinic for follow up of her diabetes and chronic medical conditions.   DM: She brought her meter in for download. She reports recently going on a several week trip to [place] to visit family. She does note some dietary indiscretion with this trip but states she was more active than usual. She continues to wish to avoid diabetic medications. She feels she can improve her blood sugar control now that her trip is over and the weather is warmer.    HTN: Her previously prescribed losartan was changed to candesartan following recall.  She denies any lightheadedness or dizziness or other side effects.  Past Medical History:  Diagnosis Date  . Diabetes mellitus    Diet-controlled. Not on antiglycemic medications.  Marland Kitchen HLD (hyperlipidemia)   . Hypertension   . Hypokalemia    recurrent  . Seasonal allergies   . Ventral hernia    Incisional ventral hernia. S/P repair by Dr. Hulen Skains  (08/2008)  . Ventricular hypertrophy 05/2006   LVH on ECG (05/2006) - Likely 2/2 HTN.  . 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 Hulen Skains. (08/2006)   Review of Systems:  Review of Systems  Constitutional: Negative for fever and weight loss.  Respiratory: Negative for cough and shortness of breath.   Cardiovascular: Negative for chest pain.  Neurological: Negative for dizziness and headaches.    Physical Exam:  Vitals:   12/28/17 1329  BP: (!) 145/82  Pulse: 74  Temp: 98.3 F (36.8 C)  TempSrc: Oral  SpO2: 99%  Weight: 184 lb 9.6 oz (83.7 kg)  Height: 5\' 5"  (1.651 m)   General: Pleasant elderly woman sitting in chair comfortably, no acute distress HEENT: Dental caries and gum disease, moist mucus membranes  CV: RRR, no murmur appreciated  Resp: Clear breath sounds without crackles or wheeze, normal work of breathing,  no distress  Abd: Soft, +BS, non-tender  Extr: Trace edema  Neuro: Alert and oriented x3  Skin: Warm, dry      Assessment & Plan:   See Encounters Tab for problem based charting.   Patient discussed with Dr. Rebeca Alert

## 2017-12-30 ENCOUNTER — Telehealth: Payer: Self-pay | Admitting: *Deleted

## 2017-12-30 NOTE — Telephone Encounter (Signed)
Call from [facility], [facility] diabetes coordinator - requesting information to update their chart on Gina Bailey. Requesting latest BP and A1C from recent visit on 6/11.

## 2018-01-02 NOTE — Assessment & Plan Note (Signed)
Reviewed prior images which made note of potential interstitial changes and recommended follow-up time.  The patient is asymptomatic with no respiratory complaints.  Findings on repeat imaging unlikely to change management at this point.  However, could consider obtaining pulmonary function test in the near future both to assess for pathologic changes and as a baseline if she were to develop lung disease.

## 2018-01-02 NOTE — Assessment & Plan Note (Signed)
Her A1c has again increased from 7.8 to 8.5 today.  Despite this increase and counseling on the importance of glycemic control, she still wishes to avoid any medications and again states that with improved focus on her diet and exercise her A1c will improve.  We will also put in referral for diabetes educator to increase her knowledge and chances of success with lifestyle changes.

## 2018-01-02 NOTE — Assessment & Plan Note (Signed)
Blood pressure today above goal at 145/82, currently on regimen of candesartan, amlodipine 10 mg daily, HCTZ 25 mg, atenolol 25 mg.  She has also previously been slightly over goal, and at this point will increase candesartan from 8 mg to 60 mg.  (Modified medication list, can increase tablet strength at time of the next refill.)

## 2018-01-08 NOTE — Progress Notes (Signed)
Internal Medicine Clinic Attending  Case discussed with Dr. Harden  at the time of the visit.  We reviewed the resident's history and exam and pertinent patient test results.  I agree with the assessment, diagnosis, and plan of care documented in the resident's note.  Alexander N Raines, MD   

## 2018-01-21 ENCOUNTER — Telehealth: Payer: Self-pay | Admitting: Internal Medicine

## 2018-01-31 ENCOUNTER — Encounter: Payer: Self-pay | Admitting: Dietician

## 2018-01-31 ENCOUNTER — Ambulatory Visit (INDEPENDENT_AMBULATORY_CARE_PROVIDER_SITE_OTHER): Payer: Medicare Other | Admitting: Dietician

## 2018-01-31 ENCOUNTER — Encounter (INDEPENDENT_AMBULATORY_CARE_PROVIDER_SITE_OTHER): Payer: Self-pay

## 2018-01-31 DIAGNOSIS — E119 Type 2 diabetes mellitus without complications: Secondary | ICD-10-CM | POA: Diagnosis not present

## 2018-01-31 NOTE — Progress Notes (Signed)
  Medical Nutrition Therapy:  Appt start time: 1010 end time:  1110. Visit # 1 Assessment:  Primary concerns today: weight loss "and everything else will follow". Ms Rinkenberger was diagnosed with diabetes in 2012. She attended Medical Nutrition Therapy sessions in 2012, 2013 and 2014. She does not want any more medicine and realizes that some of her medicines are making weight loss more difficult for her.  She is knowledgeable about foods, cooking and measurements. She is active, eats healthy food choices, but may be eating larger portions that she realizes.  Preferred Learning Style: No preference indicated  Learning Readiness: ready  ANTHROPOMETRICS: Estimated body mass index is 31.17 kg/m as calculated from the following:   Height as of 12/28/17: 5\' 5"  (1.651 m).   Weight as of this encounter: 187 lb 4.8 oz (85 kg).  WEIGHT HISTORY: Wt Readings from Last 3 Encounters:  01/31/18 187 lb 4.8 oz (85 kg)  12/28/17 184 lb 9.6 oz (83.7 kg)  09/14/17 184 lb 12.8 oz (83.8 kg)   SLEEP:good, 10-530 AM MEDICATIONS: atenolol BLOOD SUGAR:meter download shows average 155, range 122-202, checks once time a day in am.  Lab Results  Component Value Date   HGBA1C 8.5 (A) 12/28/2017   DIETARY INTAKE: Usual eating pattern includes 3 meals and 2 snacks per day. Everyday foods include oatmeal,fruits, vegetables, meats.  Avoided foods include sweets.   24-hr recall:  B ( [phone] AM): cereal, milk, fruit, egg, sometimes grits/veggie omelet/cheese Snk ( AM): nuts, fruit L ( PM): baked chicken, homemade dressing, turnip greens with olive oil Snk ( PM): fruit, nuts D ( 530 PM): baked pork chop, sweet potato, tossed salad, water Snk (7  PM): pear Beverages: water, black coffee  Usual physical activity: walks daily not sure how many minutes- picks up sticks in her yard, etc  Estimated daily energy needs: 1200- 1500 calories/day for weight loss  Progress Towards Goal(s):  In progress.   Nutritional  Diagnosis:  NB-1.4 Self-monitoring deficit As related to not knowing how many calories her food contains or she is eating.  As evidenced by her report and inability to read the food label.    Intervention:  Nutrition education about calorie counting and food label reading. Plan is to address calories and label reading, then address activity and testing in [place] around meals if needed. Coordination of care: consider alternative medicine for blood  Pressure control other than atenolol  Teaching Method Utilized: Visual,Auditory,Hands on Handouts given during visit include:avs, food and activity tracker, healthy food choices and calorie tracking booklets Barriers to learning/adherence to lifestyle change: competing values Demonstrated degree of understanding via:  Teach Back   Monitoring/Evaluation:  Dietary intake, exercise,meter, and body weight in 1 week(s)  Debera Lat, RD 01/31/2018 11:34 AM. .

## 2018-01-31 NOTE — Patient Instructions (Addendum)
You need a follow up with your doctor about March 30, 2018  5'5" acceptable weight range for 72 year old. 150-170#   Atenolol (Tenomrin) can cause weight gain   Calorie Counting for Weight Loss Calories are units of energy. Your body needs a certain amount of calories from food to keep you going throughout the day. When you eat more calories than your body needs, your body stores the extra calories as fat. When you eat fewer calories than your body needs, your body burns fat to get the energy it needs. Calorie counting means keeping track of how many calories you eat and drink each day. Calorie counting can be helpful if you need to lose weight. If you make sure to eat fewer calories than your body needs, you should lose weight. Ask your health care provider what a healthy weight is for you. For calorie counting to work, you will need to eat the right number of calories in a day in order to lose a healthy amount of weight per week. A dietitian can help you determine how many calories you need in a day and will give you suggestions on how to reach your calorie goal.  A healthy amount of weight to lose per week is usually 1-2 lb (0.5-0.9 kg). This usually means that your daily calorie intake should be reduced by 500-750 calories.  Eating 1,200 - 1,500 calories per day can help most women lose weight.  Eating 1,500 - 1,800 calories per day can help most men lose weight.  What is my plan? My goal is to have __________ calories per day. If I have this many calories per day, I should lose around __________ pounds per week. What do I need to know about calorie counting? In order to meet your daily calorie goal, you will need to:  Find out how many calories are in each food you would like to eat. Try to do this before you eat.  Decide how much of the food you plan to eat.  Write down what you ate and how many calories it had. Doing this is called keeping a food log.  To successfully lose  weight, it is important to balance calorie counting with a healthy lifestyle that includes regular activity. Aim for 150 minutes of moderate exercise (such as walking) or 75 minutes of vigorous exercise (such as running) each week. Where do I find calorie information?  The number of calories in a food can be found on a Nutrition Facts label. If a food does not have a Nutrition Facts label, try to look up the calories online or ask your dietitian for help. Remember that calories are listed per serving. If you choose to have more than one serving of a food, you will have to multiply the calories per serving by the amount of servings you plan to eat. For example, the label on a package of bread might say that a serving size is 1 slice and that there are 90 calories in a serving. If you eat 1 slice, you will have eaten 90 calories. If you eat 2 slices, you will have eaten 180 calories. How do I keep a food log? Immediately after each meal, record the following information in your food log:  What you ate. Don't forget to include toppings, sauces, and other extras on the food.  How much you ate. This can be measured in cups, ounces, or number of items.  How many calories each food and drink had.  The total number of calories in the meal.  Keep your food log near you, such as in a small notebook in your pocket, or use a mobile app or website. Some programs will calculate calories for you and show you how many calories you have left for the day to meet your goal. What are some calorie counting tips?  Use your calories on foods and drinks that will fill you up and not leave you hungry: ? Some examples of foods that fill you up are nuts and nut butters, vegetables, lean proteins, and high-fiber foods like whole grains. High-fiber foods are foods with more than 5 g fiber per serving. ? Drinks such as sodas, specialty coffee drinks, alcohol, and juices have a lot of calories, yet do not fill you up.  Eat  nutritious foods and avoid empty calories. Empty calories are calories you get from foods or beverages that do not have many vitamins or protein, such as candy, sweets, and soda. It is better to have a nutritious high-calorie food (such as an avocado) than a food with few nutrients (such as a bag of chips).  Know how many calories are in the foods you eat most often. This will help you calculate calorie counts faster.  Pay attention to calories in drinks. Low-calorie drinks include water and unsweetened drinks.  Pay attention to nutrition labels for "low fat" or "fat free" foods. These foods sometimes have the same amount of calories or more calories than the full fat versions. They also often have added sugar, starch, or salt, to make up for flavor that was removed with the fat.  Find a way of tracking calories that works for you. Get creative. Try different apps or programs if writing down calories does not work for you. What are some portion control tips?  Know how many calories are in a serving. This will help you know how many servings of a certain food you can have.  Use a measuring cup to measure serving sizes. You could also try weighing out portions on a kitchen scale. With time, you will be able to estimate serving sizes for some foods.  Take some time to put servings of different foods on your favorite plates, bowls, and cups so you know what a serving looks like.  Try not to eat straight from a bag or box. Doing this can lead to overeating. Put the amount you would like to eat in a cup or on a plate to make sure you are eating the right portion.  Use smaller plates, glasses, and bowls to prevent overeating.  Try not to multitask (for example, watch TV or use your computer) while eating. If it is time to eat, sit down at a table and enjoy your food. This will help you to know when you are full. It will also help you to be aware of what you are eating and how much you are eating. What  are tips for following this plan? Reading food labels  Check the calorie count compared to the serving size. The serving size may be smaller than what you are used to eating.  Check the source of the calories. Make sure the food you are eating is high in vitamins and protein and low in saturated and trans fats. Shopping  Read nutrition labels while you shop. This will help you make healthy decisions before you decide to purchase your food.  Make a grocery list and stick to it. Cooking  Try to [employer]  your favorite foods in a healthier way. For example, try baking instead of frying.  Use low-fat dairy products. Meal planning  Use more fruits and vegetables. Half of your plate should be fruits and vegetables.  Include lean proteins like poultry and fish. How do I count calories when eating out?  Ask for smaller portion sizes.  Consider sharing an entree and sides instead of getting your own entree.  If you get your own entree, eat only half. Ask for a box at the beginning of your meal and put the rest of your entree in it so you are not tempted to eat it.  If calories are listed on the menu, choose the lower calorie options.  Choose dishes that include vegetables, fruits, whole grains, low-fat dairy products, and lean protein.  Choose items that are boiled, broiled, grilled, or steamed. Stay away from items that are buttered, battered, fried, or served with cream sauce. Items labeled "crispy" are usually fried, unless stated otherwise.  Choose water, low-fat milk, unsweetened iced tea, or other drinks without added sugar. If you want an alcoholic beverage, choose a lower calorie option such as a glass of wine or light beer.  Ask for dressings, sauces, and syrups on the side. These are usually high in calories, so you should limit the amount you eat.  If you want a salad, choose a garden salad and ask for grilled meats. Avoid extra toppings like bacon, cheese, or fried items. Ask for  the dressing on the side, or ask for olive oil and vinegar or lemon to use as dressing.  Estimate how many servings of a food you are given. For example, a serving of cooked rice is  cup or about the size of half a baseball. Knowing serving sizes will help you be aware of how much food you are eating at restaurants. The list below tells you how big or small some common portion sizes are based on everyday objects: ? 1 oz-4 stacked dice. ? 3 oz-1 deck of cards. ? 1 tsp-1 die. ? 1 Tbsp- a ping-pong ball. ? 2 Tbsp-1 ping-pong ball. ?  cup- baseball. ? 1 cup-1 baseball. Summary  Calorie counting means keeping track of how many calories you eat and drink each day. If you eat fewer calories than your body needs, you should lose weight.  A healthy amount of weight to lose per week is usually 1-2 lb (0.5-0.9 kg). This usually means reducing your daily calorie intake by 500-750 calories.  The number of calories in a food can be found on a Nutrition Facts label. If a food does not have a Nutrition Facts label, try to look up the calories online or ask your dietitian for help.  Use your calories on foods and drinks that will fill you up, and not on foods and drinks that will leave you hungry.  Use smaller plates, glasses, and bowls to prevent overeating.

## 2018-02-01 NOTE — Progress Notes (Signed)
I'll try to see her during continuity clinic and make sure to discuss changing her atenolol. Thank you

## 2018-02-07 ENCOUNTER — Encounter: Payer: Self-pay | Admitting: *Deleted

## 2018-02-08 ENCOUNTER — Ambulatory Visit (INDEPENDENT_AMBULATORY_CARE_PROVIDER_SITE_OTHER): Payer: Medicare Other | Admitting: Dietician

## 2018-02-08 ENCOUNTER — Encounter: Payer: Self-pay | Admitting: Dietician

## 2018-02-08 DIAGNOSIS — Z713 Dietary counseling and surveillance: Secondary | ICD-10-CM | POA: Diagnosis not present

## 2018-02-08 DIAGNOSIS — E119 Type 2 diabetes mellitus without complications: Secondary | ICD-10-CM

## 2018-02-08 NOTE — Patient Instructions (Signed)
What we talked about today:   Your blood sugars are in goal of <50 point rise after breakfast. Good Job!!!  The plan:   Consider walking every other day like you did this past Saturday.   Please check blood sugars before your evening meal and 2 hours after and write down what you eat and drink at that evening meal ( just like you did for breakfast this past week)     Call anytime with questions or concerns  Debera Lat Diabetes Educator [phone]

## 2018-02-08 NOTE — Progress Notes (Signed)
  Medical Nutrition Therapy:  Appt start time: 1030 end time:  1130. Visit # 2 Assessment:  Primary concerns today: weight loss and glycemic control Ms. Lobue kept good food records, but needed assistance with calorie counting. She also tested in [place] around breakfast for the past week and only one meal difference was 50mg /dl 2 hours postprandial. Her problem is most likely that her fasting blood sugars are mostly higher than 130 mg/dl . The lowest fasting blood sugar was the morning after she walked more.  Goal weight 174#  ANTHROPOMETRICS: refused weight today WEIGHT HISTORY: Wt Readings from Last 3 Encounters:  01/31/18 187 lb 4.8 oz (85 kg)  12/28/17 184 lb 9.6 oz (83.7 kg)  09/14/17 184 lb 12.8 oz (83.8 kg)   SLEEP:good, 10-530 AM MEDICATIONS: atenolol BLOOD SUGAR:meter download shows average 161, range 122-207,    Lab Results  Component Value Date   HGBA1C 8.5 (A) 12/28/2017   DIETARY INTAKE: Usual eating pattern includes 3 meals and 2 snacks per day. Everyday foods include oatmeal,fruits, vegetables, meats.  Avoided foods include sweets.   24-hr recall:  B ( [phone] AM): cereal, milk, fruit, egg, sometimes grits/oatmeal/veggie omelet/cheese Snk ( AM): nuts, fruit L ( PM): baked chicken, homemade dressing, turnip greens with olive oil Snk ( PM): fruit, nuts D ( 530 PM): baked pork chop/rib eye/chicken leg, sweet potato, tossed salad, [place] dressing, water Snk (7  PM): pear Beverages: water, black coffee  Usual physical activity: walks daily not sure how many minutes- picks up sticks in her yard, etc  Estimated daily energy needs: Her food records showed ~ 1100-1200 calories/day for weight loss  Progress Towards Goal(s):  In progress.   Nutritional Diagnosis:  NB-1.4 Self-monitoring deficit As related to not knowing how many calories her food contains or she is eating.  As evidenced by her report and inability to read the food label.    Intervention:  Nutrition  education about calorie counting. Plan is to address calories, saturated fats,  activity and testing in pairs around dinner. Coordination of care: consider alternative medicine for blood  Pressure control other than atenolol  Teaching Method Utilized: Visual,Auditory,Hands on Handouts given during visit include:avs, food and activity tracker, healthy food choices and calorie tracking booklets Barriers to learning/adherence to lifestyle change: competing values Demonstrated degree of understanding via:  Teach Back   Monitoring/Evaluation:  Dietary intake, exercise,meter, and body weight in 2 week(s)  Debera Lat, RD 02/08/2018 2:04 PM. .

## 2018-02-09 ENCOUNTER — Other Ambulatory Visit: Payer: Self-pay | Admitting: Dietician

## 2018-02-09 DIAGNOSIS — E119 Type 2 diabetes mellitus without complications: Secondary | ICD-10-CM

## 2018-02-09 NOTE — Progress Notes (Signed)
Request 2nd Medical Nutrition Therapy referral same year.

## 2018-02-09 NOTE — Progress Notes (Signed)
Yes please that would be great

## 2018-02-11 ENCOUNTER — Other Ambulatory Visit: Payer: Self-pay | Admitting: Internal Medicine

## 2018-02-21 ENCOUNTER — Telehealth: Payer: Self-pay | Admitting: Dietician

## 2018-02-21 DIAGNOSIS — E119 Type 2 diabetes mellitus without complications: Secondary | ICD-10-CM

## 2018-02-21 MED ORDER — GLUCOSE BLOOD VI STRP: ORAL_STRIP | 1 refills | Status: DC

## 2018-02-21 NOTE — Telephone Encounter (Signed)
Gina Bailey says it has been a week without her test strips. The order needs frequency of testing in the sig.

## 2018-03-01 ENCOUNTER — Ambulatory Visit (INDEPENDENT_AMBULATORY_CARE_PROVIDER_SITE_OTHER): Payer: Medicare Other | Admitting: Dietician

## 2018-03-01 ENCOUNTER — Encounter: Payer: Self-pay | Admitting: Dietician

## 2018-03-01 DIAGNOSIS — Z6830 Body mass index (BMI) 30.0-30.9, adult: Secondary | ICD-10-CM | POA: Diagnosis not present

## 2018-03-01 DIAGNOSIS — Z713 Dietary counseling and surveillance: Secondary | ICD-10-CM

## 2018-03-01 DIAGNOSIS — E119 Type 2 diabetes mellitus without complications: Secondary | ICD-10-CM

## 2018-03-01 NOTE — Progress Notes (Signed)
  Medical Nutrition Therapy:  Appt start time: 1020 end time:  1110. Visit # 3 Assessment:  Primary concerns today: weight loss and glycemic control Gina Bailey kept good food records, is measuring and planning and appears independent with most calorie counting. She tested one time in pairs around dinner and found a 132 mg/dl difference. She identified the cornbread as what caused the extra rise in her blood sugar more than 50mg /dl 2 hours postprandial. However, her fish was battered also.    Goal weight 174#, her weight is decreased a little more than 5# in the past 3 weeks.   ANTHROPOMETRICS: Estimated body mass index is 30.27 kg/m as calculated from the following:   Height as of 12/28/17: 5\' 5"  (1.651 m).   Weight as of this encounter: 181 lb 14.4 oz (82.5 kg).  WEIGHT HISTORY: Wt Readings from Last 5 Encounters:  03/01/18 181 lb 14.4 oz (82.5 kg)  01/31/18 187 lb 4.8 oz (85 kg)  12/28/17 184 lb 9.6 oz (83.7 kg)  09/14/17 184 lb 12.8 oz (83.8 kg)  05/11/17 179 lb 12.8 oz (81.6 kg)   BLOOD SUGAR:meter download shows average 145(161), range 89-214(122-207)      A1C due 03/30/18 anticipate it will be back in the 7-8% range  DIETARY INTAKE: Usual eating pattern includes 3 meals and 2 snacks per day. Everyday foods include oatmeal,fruits, vegetables, meats.  Avoided foods include sweets.   24-hr recall:  B ( [phone] AM): cereal, milk, fruit D ( 530 PM): battered baked fish, cabbage, cornbread  Snks ( AM): nuts, fruit Beverages: water, black coffee  Usual physical activity: walks daily not sure how many minutes- picks up sticks in her yard, etc  Estimated daily energy needs: Her food records showed ~ 1100-1200 calories/day for weight loss  Progress Towards Goal(s):  In progress.   Nutritional Diagnosis:  NB-1.4 Self-monitoring deficit As related to not knowing how many calories her food contains or she is eating.  As evidenced by her report and inability to read the food label.     Intervention:  Nutrition education about calorie counting. Plan is to address calories, saturated fats,  activity and testing in pairs around dinner. Coordination of care: consider alternative medicine for blood  Pressure control other than atenolol  Teaching Method Utilized: Visual,Auditory,Hands on Handouts given during visit include:avs, food and activity tracker, healthy food choices and calorie tracking booklets Barriers to learning/adherence to lifestyle change: competing values Demonstrated degree of understanding via:  Teach Back   Monitoring/Evaluation:  Dietary intake, exercise,meter, and body weight in 4 week(s)  Debera Lat, RD 03/01/2018 10:25 AM. .

## 2018-03-01 NOTE — Patient Instructions (Addendum)
What we talked about today:   a1c can be done after March 30, 2018.  How walking can help lower your blood sugar.  You want to decrease your weight by 5 more pounds- to 176#  The plan:   Your goal is to walk about an hour for 3 days each week Eating- write out my calories and use the low calorie food to make your menu.   Follow up in 4 weeks.   Call anytime with questions or concerns  Debera Lat Diabetes Educator [phone]

## 2018-03-09 ENCOUNTER — Encounter: Payer: Self-pay | Admitting: *Deleted

## 2018-04-03 ENCOUNTER — Other Ambulatory Visit: Payer: Self-pay | Admitting: Internal Medicine

## 2018-04-03 DIAGNOSIS — I10 Essential (primary) hypertension: Principal | ICD-10-CM

## 2018-04-03 DIAGNOSIS — E1159 Type 2 diabetes mellitus with other circulatory complications: Secondary | ICD-10-CM

## 2018-04-03 DIAGNOSIS — I152 Hypertension secondary to endocrine disorders: Secondary | ICD-10-CM

## 2018-04-05 ENCOUNTER — Encounter: Payer: Medicare Other | Admitting: Dietician

## 2018-04-05 ENCOUNTER — Encounter: Payer: Medicare Other | Admitting: Internal Medicine

## 2018-04-11 ENCOUNTER — Telehealth: Payer: Self-pay | Admitting: Dietician

## 2018-04-11 NOTE — Telephone Encounter (Signed)
Cancelling her appointment

## 2018-04-12 ENCOUNTER — Encounter: Payer: Medicare Other | Admitting: Internal Medicine

## 2018-04-12 ENCOUNTER — Encounter: Payer: Medicare Other | Admitting: Dietician

## 2018-04-19 ENCOUNTER — Ambulatory Visit (INDEPENDENT_AMBULATORY_CARE_PROVIDER_SITE_OTHER): Payer: Medicare Other | Admitting: *Deleted

## 2018-04-19 DIAGNOSIS — Z23 Encounter for immunization: Secondary | ICD-10-CM | POA: Diagnosis not present

## 2018-04-20 ENCOUNTER — Ambulatory Visit: Payer: Medicare Other

## 2018-05-24 ENCOUNTER — Other Ambulatory Visit: Payer: Self-pay

## 2018-05-24 ENCOUNTER — Ambulatory Visit: Payer: Medicare Other | Admitting: Internal Medicine

## 2018-05-24 ENCOUNTER — Encounter: Payer: Self-pay | Admitting: Internal Medicine

## 2018-05-24 ENCOUNTER — Ambulatory Visit (INDEPENDENT_AMBULATORY_CARE_PROVIDER_SITE_OTHER): Payer: Medicare Other | Admitting: Dietician

## 2018-05-24 VITALS — BP 156/83 | HR 82 | Temp 98.6°F | Ht 65.0 in | Wt 175.6 lb

## 2018-05-24 DIAGNOSIS — E1169 Type 2 diabetes mellitus with other specified complication: Secondary | ICD-10-CM

## 2018-05-24 DIAGNOSIS — Z713 Dietary counseling and surveillance: Secondary | ICD-10-CM

## 2018-05-24 DIAGNOSIS — I10 Essential (primary) hypertension: Secondary | ICD-10-CM | POA: Diagnosis not present

## 2018-05-24 DIAGNOSIS — E119 Type 2 diabetes mellitus without complications: Secondary | ICD-10-CM

## 2018-05-24 DIAGNOSIS — E785 Hyperlipidemia, unspecified: Secondary | ICD-10-CM

## 2018-05-24 DIAGNOSIS — Z0000 Encounter for general adult medical examination without abnormal findings: Secondary | ICD-10-CM | POA: Diagnosis not present

## 2018-05-24 DIAGNOSIS — Z6829 Body mass index (BMI) 29.0-29.9, adult: Secondary | ICD-10-CM

## 2018-05-24 DIAGNOSIS — M7989 Other specified soft tissue disorders: Secondary | ICD-10-CM | POA: Diagnosis not present

## 2018-05-24 DIAGNOSIS — E1159 Type 2 diabetes mellitus with other circulatory complications: Secondary | ICD-10-CM

## 2018-05-24 DIAGNOSIS — Z79899 Other long term (current) drug therapy: Secondary | ICD-10-CM

## 2018-05-24 LAB — POCT GLYCOSYLATED HEMOGLOBIN (HGB A1C): Hemoglobin A1C: 6.9 % — AB (ref 4.0–5.6)

## 2018-05-24 LAB — GLUCOSE, CAPILLARY: GLUCOSE-CAPILLARY: 128 mg/dL — AB (ref 70–99)

## 2018-05-24 MED ORDER — TRIAMTERENE-HCTZ 37.5-25 MG PO CAPS: 1.0000 | ORAL_CAPSULE | Freq: Every day | ORAL | 2 refills | Status: DC

## 2018-05-24 MED ORDER — GLUCOSE BLOOD VI STRP: ORAL_STRIP | 3 refills | Status: DC

## 2018-05-24 NOTE — Progress Notes (Signed)
CC: Hypertension  HPI: [name] is a 72 y.o. F w/ PMH of T2DM, HLD, HTN presenting to the clinic for management of her Type 2 diabetes mellitus, hypertension and hyperlipidemia. She states she feels well and she has been continuing to control her diabetes with diet only. She states she is hoping to reduce the amount of medications she takes and she was informed that atenolol for her blood pressure can attribute to weight gain and would like to know if its possible to stop it.  Past Medical History:  Diagnosis Date  . Diabetes mellitus    Diet-controlled. Not on antiglycemic medications.  Marland Kitchen HLD (hyperlipidemia)   . Hypertension   . Hypokalemia    recurrent  . Seasonal allergies   . Skin complaints 09/15/2017  . Ventral hernia    Incisional ventral hernia. S/P repair by Dr. Hulen Skains  (08/2008)  . Ventricular hypertrophy 05/2006   LVH on ECG (05/2006) - Likely 2/2 HTN.  . 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 Hulen Skains. (08/2006)    Review of Systems: Review of Systems  Constitutional: Negative for fever and malaise/fatigue.  HENT: Negative for congestion, hearing loss and sinus pain.   Cardiovascular: Positive for leg swelling. Negative for chest pain and palpitations.  Gastrointestinal: Negative for constipation, diarrhea, nausea and vomiting.  Neurological: Negative for dizziness, sensory change and headaches.    Physical Exam: Vitals:   05/24/18 1329  BP: (!) 156/83  Pulse: 82  Temp: 98.6 F (37 C)  TempSrc: Oral  SpO2: 97%  Weight: 175 lb 9.6 oz (79.7 kg)  Height: 5\' 5"  (1.651 m)   Physical Exam  Constitutional: She is oriented to person, place, and time. She appears well-developed and well-nourished. No distress.  HENT:  Head: Normocephalic and atraumatic.  Mouth/Throat: Oropharynx is clear and moist. No oropharyngeal exudate.  Eyes: Pupils are equal, round, and reactive to light. Conjunctivae and EOM  are normal. No scleral icterus.  Neck: Normal range of motion. Neck supple. No JVD present.  Cardiovascular: Normal rate, regular rhythm, normal heart sounds and intact distal pulses.  Respiratory: Effort normal and breath sounds normal. No respiratory distress. She has no wheezes.  GI: Soft. Bowel sounds are normal.  Musculoskeletal: Normal range of motion. She exhibits edema (1+ pitting edema around ankles and feet).  Neurological: She is alert and oriented to person, place, and time. She has normal reflexes. No cranial nerve deficit.     Assessment & Plan:   Hypertension BP Readings from Last 3 Encounters:  05/24/18 (!) 156/83  12/28/17 (!) 145/82  09/14/17 (!) 143/92   Patient's BP above goal. Currently on atenolol 25mg , amlodipine 10mg , candesartan 16mg , HCTZ 25mg . On calcium channel blocker and diuretic. Atenolol does have significant side effect of weight gain and will stop therapy per patient's request. Can consider a newer beta blocker if indicated in the future. Considering patient's bp is still above goal and she is exhibiting mild pitting edema, will add on potassium sparing diuretic to current regimen.  - BMP today - D/c Atenolol 25mg , HCTZ 25mg , KCl 30mEq - Start triamterene-HCTZ 37.5-25mg  daily  Type 2 diabetes mellitus without complication, without long-term current use of insulin (HCC) POC hgb a1c today 6.9 significantly improved from dietary management only. Diabetic foot exam unremarkable. - C/w carb-controlled diet - Microalbumin/Creatine urine ratio  Hyperlipidemia associated with type 2 diabetes mellitus ([place]) Currently taking simvastatin 20mg  daily. Denies any muscle pain  or malaise. Patient endorsing low sat fat diet. Last lipid panel >1 year ago - Lipid panel today - C/w simvastatin 20mg  daily  Preventative health care Patient agrees to flu shot and 2nd pneumonia shot today. Also agrees to checking Hep C antibodies    Patient seen with Dr. Eppie Gibson    -Gilberto Better, PGY1

## 2018-05-24 NOTE — Progress Notes (Signed)
  Medical Nutrition Therapy:  Appt start time: 1500 end time:  7579 Visit # 4 Assessment:  Primary concerns today: weight loss, glycemic control, help with her lancing device Ms. Mudry forgot her food records and reports, is measuring and planning meals. She walks at least an hour three times a week. Has [employer] memberships, but seems to be in the contemplative stage in using this to walk when the weather is bad. She currently walks at stores instead. Her A1C dropped from 8.5% to 6.9% with her lifestyle changes  ANTHROPOMETRICS: Estimated body mass index is 29.22 kg/m as calculated from the following:   Height as of an earlier encounter on 05/24/18: 5\' 5"  (1.651 m).   Weight as of an earlier encounter on 05/24/18: 175 lb 9.6 oz (79.7 kg).  WEIGHT HISTORY: Wt Readings from Last 5 Encounters:  05/24/18 175 lb 9.6 oz (79.7 kg)  03/01/18 181 lb 14.4 oz (82.5 kg)  01/31/18 187 lb 4.8 oz (85 kg)  12/28/17 184 lb 9.6 oz (83.7 kg)  09/14/17 184 lb 12.8 oz (83.8 kg)   BLOOD SUGAR: Lab Results  Component Value Date   HGBA1C 6.9 (A) 05/24/2018   Progress Towards Goal(s):  Some progress.   Nutritional Diagnosis:  NB-1.4 Self-monitoring deficit As related to not knowing how many calories her food contains or she is eating improved As evidenced by her report and inability to read the food label.    Intervention:  Nutrition education about her new blood glucose meter, counseling about lifestyle factors that helped with her success in meeting her goals.  Coordination of care: request strips for new meter  Teaching Method Utilized: Visual,Auditory,Hands on Handouts given during visit include:avs, food and activity tracker, healthy food choices and calorie tracking booklets Barriers to learning/adherence to lifestyle change: competing values Demonstrated degree of understanding via:  Teach Back   Monitoring/Evaluation:  Dietary intake, exercise,meter, and body weight in 3 month(s)  Debera Lat, RD 05/24/2018 3:20 PM. .

## 2018-05-24 NOTE — Patient Instructions (Addendum)
Gina Bailey  Thank you for coming to the clinic today here are her recommendations based on what we discussed:  Continue your dietary changes to manage her diabetes. We will inform you of any abnormal lab results from the blood test from today. Please stop taking your atenolol and HCTZ Please start taking Triamteren/HCTZ daily  Please return in 3 months for check up  Thank you

## 2018-05-25 LAB — BMP8+ANION GAP
Anion Gap: 19 mmol/L — ABNORMAL HIGH (ref 10.0–18.0)
BUN/Creatinine Ratio: 16 (ref 12–28)
BUN: 23 mg/dL (ref 8–27)
CO2: 26 mmol/L (ref 20–29)
CREATININE: 1.41 mg/dL — AB (ref 0.57–1.00)
Calcium: 10.5 mg/dL — ABNORMAL HIGH (ref 8.7–10.3)
Chloride: 99 mmol/L (ref 96–106)
GFR calc Af Amer: 42 mL/min/{1.73_m2} — ABNORMAL LOW (ref >59)
GFR calc non Af Amer: 37 mL/min/{1.73_m2} — ABNORMAL LOW (ref >59)
GLUCOSE: 127 mg/dL — AB (ref 65–99)
Potassium: 3.6 mmol/L (ref 3.5–5.2)
SODIUM: 144 mmol/L (ref 134–144)

## 2018-05-25 LAB — HEPATITIS C ANTIBODY: Hep C Virus Ab: <0.1 s/co ratio (ref 0.0–0.9)

## 2018-05-25 LAB — LIPID PANEL
Chol/HDL Ratio: 2.3 ratio (ref 0.0–4.4)
Cholesterol, Total: 166 mg/dL (ref 100–199)
HDL: 71 mg/dL (ref >39)
LDL CALC: 80 mg/dL (ref 0–99)
Triglycerides: 73 mg/dL (ref 0–149)
VLDL CHOLESTEROL CAL: 15 mg/dL (ref 5–40)

## 2018-05-25 LAB — MICROALBUMIN / CREATININE URINE RATIO
Creatinine, Urine: 69 mg/dL
Microalb/Creat Ratio: 1135.5 mg/g creat — ABNORMAL HIGH (ref 0.0–30.0)
Microalbumin, Urine: 783.5 ug/mL

## 2018-05-26 ENCOUNTER — Encounter: Payer: Self-pay | Admitting: Internal Medicine

## 2018-05-26 NOTE — Assessment & Plan Note (Signed)
Currently taking simvastatin 20mg  daily. Denies any muscle pain or malaise. Patient endorsing low sat fat diet. Last lipid panel >1 year ago - Lipid panel today - C/w simvastatin 20mg  daily

## 2018-05-26 NOTE — Assessment & Plan Note (Addendum)
POC hgb a1c today 6.9 significantly improved from dietary management only. Diabetic foot exam unremarkable. - C/w carb-controlled diet - Microalbumin/Creatine urine ratio

## 2018-05-26 NOTE — Assessment & Plan Note (Signed)
Patient agrees to flu shot and 2nd pneumonia shot today. Also agrees to checking Hep C antibodies

## 2018-05-26 NOTE — Assessment & Plan Note (Addendum)
BP Readings from Last 3 Encounters:  05/24/18 (!) 156/83  12/28/17 (!) 145/82  09/14/17 (!) 143/92   Patient's BP above goal. Currently on atenolol 25mg , amlodipine 10mg , candesartan 16mg , HCTZ 25mg . On calcium channel blocker and diuretic. Atenolol does have significant side effect of weight gain and will stop therapy per patient's request. Can consider a newer beta blocker if indicated in the future. Considering patient's bp is still above goal and she is exhibiting mild pitting edema, will add on potassium sparing diuretic to current regimen.  - BMP today - D/c Atenolol 25mg , HCTZ 25mg , KCl 49mEq - Start triamterene-HCTZ 37.5-25mg  daily

## 2018-05-27 ENCOUNTER — Other Ambulatory Visit: Payer: Self-pay | Admitting: Internal Medicine

## 2018-05-27 NOTE — Telephone Encounter (Signed)
Pt said that Dr Truman Hayward took her off two medicine and replace them pt haven't heard anything, check with pharmacy nothing there, pls contact [phone].  Pt also needs to new test strips and the drum.Marland Kitchen

## 2018-05-27 NOTE — Telephone Encounter (Signed)
Called pt, reassured her that med had been sent to [facility]. She will call for problems

## 2018-05-27 NOTE — Progress Notes (Signed)
I saw and evaluated the patient.  I personally confirmed the key portions of Dr. Marguerita Beards history and exam and reviewed pertinent patient test results.  The assessment, diagnosis, and plan were formulated together and I agree with the documentation in the resident's note.

## 2018-05-30 ENCOUNTER — Telehealth: Payer: Self-pay | Admitting: Dietician

## 2018-05-30 ENCOUNTER — Telehealth: Payer: Self-pay | Admitting: Internal Medicine

## 2018-05-30 NOTE — Telephone Encounter (Signed)
Left voicemail on patient's cell phone number asking her to call back to discuss abnormal lab results in detail.

## 2018-05-30 NOTE — Telephone Encounter (Signed)
Got her blood pressure medicine problem resolved.

## 2018-05-31 ENCOUNTER — Telehealth: Payer: Self-pay | Admitting: Internal Medicine

## 2018-05-31 NOTE — Telephone Encounter (Signed)
Since test results are abnormal sent dr Truman Hayward text message with pt's ph# and ask him to call or call triage for triage assist

## 2018-05-31 NOTE — Telephone Encounter (Signed)
Pt is returning phone in reference to her Test results.

## 2018-05-31 NOTE — Telephone Encounter (Signed)
I called patient back on her home phone number and was able to reach her. We discussed her bmp and her declining renal function and proteinuria and planned for monitoring her kidney functions and possibly changing her amlodipine to diltiazem and increasing her ARB dose on her next visit.

## 2018-07-18 ENCOUNTER — Other Ambulatory Visit: Payer: Self-pay | Admitting: Internal Medicine

## 2018-07-18 DIAGNOSIS — E1169 Type 2 diabetes mellitus with other specified complication: Secondary | ICD-10-CM

## 2018-07-18 DIAGNOSIS — E785 Hyperlipidemia, unspecified: Principal | ICD-10-CM

## 2018-07-18 MED ORDER — SIMVASTATIN 20 MG PO TABS: 20.0000 mg | ORAL_TABLET | Freq: Every day | ORAL | 3 refills | Status: DC

## 2018-07-18 NOTE — Telephone Encounter (Signed)
She does not need the potassium supplement but I can't seem to cancel just 1 order. I will cancel both refills and resubmit the simvastatin order. Thank you.

## 2018-07-21 ENCOUNTER — Other Ambulatory Visit: Payer: Self-pay | Admitting: Internal Medicine

## 2018-07-21 DIAGNOSIS — Z1231 Encounter for screening mammogram for malignant neoplasm of breast: Secondary | ICD-10-CM

## 2018-07-29 ENCOUNTER — Other Ambulatory Visit: Payer: Self-pay | Admitting: Internal Medicine

## 2018-08-22 ENCOUNTER — Ambulatory Visit
Admission: RE | Admit: 2018-08-22 | Discharge: 2018-08-22 | Disposition: A | Discharge status: Home / Self Care | Payer: Medicare Other | Source: Ambulatory Visit | Attending: Internal Medicine | Admitting: Internal Medicine

## 2018-08-22 DIAGNOSIS — Z1231 Encounter for screening mammogram for malignant neoplasm of breast: Secondary | ICD-10-CM

## 2018-08-30 ENCOUNTER — Encounter: Payer: Medicare Other | Admitting: Dietician

## 2018-08-30 ENCOUNTER — Encounter: Payer: Self-pay | Admitting: Internal Medicine

## 2018-08-30 ENCOUNTER — Ambulatory Visit: Payer: Medicare Other | Admitting: Internal Medicine

## 2018-08-30 VITALS — BP 134/84 | HR 101 | Wt 172.8 lb

## 2018-08-30 DIAGNOSIS — E119 Type 2 diabetes mellitus without complications: Secondary | ICD-10-CM | POA: Diagnosis not present

## 2018-08-30 DIAGNOSIS — Z79899 Other long term (current) drug therapy: Secondary | ICD-10-CM | POA: Diagnosis not present

## 2018-08-30 DIAGNOSIS — I10 Essential (primary) hypertension: Secondary | ICD-10-CM | POA: Diagnosis not present

## 2018-08-30 DIAGNOSIS — E785 Hyperlipidemia, unspecified: Secondary | ICD-10-CM

## 2018-08-30 LAB — GLUCOSE, CAPILLARY: GLUCOSE-CAPILLARY: 111 mg/dL — AB (ref 70–99)

## 2018-08-30 LAB — POCT GLYCOSYLATED HEMOGLOBIN (HGB A1C): Hemoglobin A1C: 6.7 % — AB (ref 4.0–5.6)

## 2018-08-30 MED ORDER — AMLODIPINE-VALSARTAN-HCTZ 5-160-25 MG PO TABS: 1.0000 | ORAL_TABLET | Freq: Every day | ORAL | 3 refills | Status: DC

## 2018-08-30 NOTE — Patient Instructions (Addendum)
Gina Bailey  Thank you for coming to the clinic. Here are our recommendation for you at discharge:  - Please start Amloidipine-Valsartan-HCTZ medication 1 pill a day    Amlodipine; Hydrochlorothiazide, HCTZ; Valsartan tablets What is this medicine? AMLODIPINE; HYDROCHLOROTHIAZIDE, HCTZ; VALSARTAN (am LOE di peen; hye droe klor oh THYE a zide; val SAR tan) is a combination of a calcium channel blocker, a diuretic, and an angiotensin II antagonist. This medicine is used to treat high blood pressure. This medicine may be used for other purposes; ask your health care provider or pharmacist if you have questions. COMMON BRAND NAME(S): Exforge HCT What should I tell my health care provider before I take this medicine? They need to know if you have any of these conditions: -decreased urine -heart failure, recent heart attack, or other heart problems -if you are on a special diet, like a low salt diet -immune system problems, like lupus -kidney disease -liver disease -an unusual or allergic reaction to amlodipine, hydrochlorothiazide, sulfa drugs, valsartan, other medicines, foods, dyes, or preservatives -pregnant or trying to get pregnant -breast-feeding How should I use this medicine? Take this medicine by mouth with a glass of water. Follow the directions on the prescription label. You can take it with or without food. If it upsets your stomach, take it with food. Take your medicine at regular intervals. Do not take it more often than directed. Do not stop taking except on your doctor's advice. Talk to your pediatrician regarding the use of this medicine in children. Special care may be needed. Patients over 22 years old may have a stronger reaction and need a smaller dose. Overdosage: If you think you have taken too much of this medicine contact a poison control center or emergency room at once. NOTE: This medicine is only for you. Do not share this medicine with others. What if I miss a  dose? If you miss a dose, take it as soon as you can. If it is almost time for your next dose, take only that dose. Do not take double or extra doses. What may interact with this medicine? -alcohol -barbiturates like phenobarbital -carbamazepine -diuretics like triamterene, spironolactone, or amiloride -lithium -medicines for blood pressure -medicines for diabetes -NSAIDS, medicines for pain and inflammation, like ibuprofen or naproxen -potassium salts or potassium supplements -prescription pain medicines -skeletal muscle relaxants like tubocurarine -some cholesterol lowering medicines like cholestyramine or colestipol -steroid medicines like prednisone or cortisone This list may not describe all possible interactions. Give your health care provider a list of all the medicines, herbs, non-prescription drugs, or dietary supplements you use. Also tell them if you smoke, drink alcohol, or use illegal drugs. Some items may interact with your medicine. What should I watch for while using this medicine? Visit your doctor or health care professional for regular checks on your progress. Check your blood pressure as directed. Ask your doctor or health care professional what your blood pressure should be and when you should contact him or her. You must not get dehydrated. Ask your doctor or health care professional how much fluid you need to drink a day. Check with him or her if you get an attack of severe diarrhea, nausea and vomiting, or if you sweat a lot. The loss of too much body fluid can make it dangerous for you to take this medicine. Women should inform their doctor if they wish to become pregnant or think they might be pregnant. There is a potential for serious side effects to an unborn  child. Talk to your health care professional or pharmacist for more information. You may get drowsy or dizzy. Do not drive, use machinery, or do anything that needs mental alertness until you know how this  medicine affects you. Do not stand or sit up quickly, especially if you are an older patient. This reduces the risk of dizzy or fainting spells. Alcohol may interfere with the effect of this medicine. Avoid alcoholic drinks. This medicine may affect your blood sugar level. If you have diabetes, check with your doctor or health care professional before changing the dose of your diabetic medicine. Avoid salt substitutes unless you are told otherwise by your doctor or health care professional. This medicine can make you more sensitive to the sun. Keep out of the sun. If you cannot avoid being in the sun, wear protective clothing and use sunscreen. Do not use sun lamps or tanning beds/booths. Do not treat yourself for coughs, colds, or pain while you are taking this medicine without asking your doctor or health care professional for advice. Some ingredients may increase your blood pressure. If you are going to have surgery or dialysis, tell your doctor or health care professional that you are taking this medicine. What side effects may I notice from receiving this medicine? Side effects that you should report to your doctor or health care professional as soon as possible: -allergic reactions like skin rash, itching or hives, swelling of the face, lips, or tongue -breathing problems -changes in vision -chest pain -confusion -dark urine -eye pain -fast, irregular heartbeat -feeling faint or lightheaded, falls -low blood pressure -muscle cramps -redness, blistering, peeling or loosening of the skin, including inside the mouth -stomach pain -swelling of the hands, ankles, or feet -trouble passing urine or change in the amount of urine -worsened gout pain -yellowing of the eyes or skin Side effects that usually do not require medical attention (report to your doctor or health care professional if they continue or are bothersome): -change in sex drive or performance -cough -diarrhea -flushing of  face, skin -headache -nausea, vomiting -stomach gas, pain -weak or tired This list may not describe all possible side effects. Call your doctor for medical advice about side effects. You may report side effects to FDA at 1-800-FDA-1088. Where should I keep my medicine? Keep out of the reach of children. Store at room temperature between 15 and 30 degrees C (59 and 86 degrees F). Protect from moisture. Throw away any unused medicine after the expiration date. NOTE: This sheet is a summary. It may not cover all possible information. If you have questions about this medicine, talk to your doctor, pharmacist, or health care provider.  2019 Elsevier/Gold Standard (2015-08-07 18:45:28)

## 2018-08-30 NOTE — Assessment & Plan Note (Signed)
BP Readings from Last 3 Encounters:  08/30/18 134/84  05/24/18 (!) 156/83  12/28/17 (!) 145/82   Patient's BP much more improved but still slightly above goal. Currently on amlodipine 10mg , candesartan 16mg , triamterene-HCTZ 37.5mg -25mg . She has tolerated her current anti-hypertensive well without significant complaints or side effects but she would like to take less medicine if possible. Concern for inadequate control on lower doses but will attempt to help consolidate her medications with amlodipine-valsartan-HCTZ combination pill.  - D/c amlodipine 10mg , candesartan 16mg , triamterene-HCTZ 37.5mg -25mg  - Start amlodipine-valsartan-HCTZ 5-160-25mg  daily - BMP today

## 2018-08-30 NOTE — Assessment & Plan Note (Signed)
Significant improvement of her hemoglobin a1c with dietary changes. Current diabetic foot exam normal. Working with dietitian to improve diabetes, especially by switching out sugary snacks for healthy snacks, such as walnuts. Had worsening proteinuria on microalbumin/creatine urine ratio. Currently on candesartan (used to be no losartan prior to the recall)  - C/w carb-controlled diet - Hgb a1c today - Can add on metformin if not improving - Could also consider SGLT-2 inhibitor for her diabetes as she has difficulty managing her blood pressure  POC hgb a1c improving at 6.7. Will continue to encourage lifestyle modifications and hold off on adding diabetic medications for now.

## 2018-08-30 NOTE — Progress Notes (Signed)
CC: Hypertension  HPI: Gina Bailey is a 73 y.o. T2DM, HLD, HTN presenting for management of her chronic conditions. She states she has been feeling well without any obvious complaints at this time. She states she has been doing well with eating better, especially by eating walnuts and peanuts instead of eating sugary snacks. She states she would like to reduce the amount of medications she takes and focus on getting off any medication all together. She denies any significant blurry vision, numbness in her feet, nausea, vomiting, diarrhea, constipation or headaches.  Past Medical History:  Diagnosis Date  . Diabetes mellitus    Diet-controlled. Not on antiglycemic medications.  Marland Kitchen HLD (hyperlipidemia)   . Hypertension   . Hypokalemia    recurrent  . Seasonal allergies   . Skin complaints 09/15/2017  . Ventral hernia    Incisional ventral hernia. S/P repair by Dr. Hulen Skains  (08/2008)  . Ventricular hypertrophy 05/2006   LVH on ECG (05/2006) - Likely 2/2 HTN.  . 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 Hulen Skains. (08/2006)   Review of Systems: Review of Systems  Constitutional: Negative for chills, fever and malaise/fatigue.  Respiratory: Negative for shortness of breath.   Cardiovascular: Negative for chest pain.  Gastrointestinal: Negative for constipation, diarrhea, nausea and vomiting.  Neurological: Negative for dizziness, tingling, sensory change and headaches.    Physical Exam: Vitals:   08/30/18 1315 08/30/18 1319  BP: (!) 157/93 134/84  Pulse: (!) 110 (!) 101  SpO2: 96%   Weight: 172 lb 12.8 oz (78.4 kg)    Physical Exam  Constitutional: She is oriented to person, place, and time. She appears well-developed and well-nourished. No distress.  HENT:  Mouth/Throat: Oropharynx is clear and moist.  Eyes: Conjunctivae are normal.  Cardiovascular: Normal rate, regular rhythm, normal heart sounds and intact distal pulses.   Respiratory: Effort normal and breath sounds normal. She has no wheezes. She has no rales.  GI: Soft. Bowel sounds are normal. There is no abdominal tenderness.  Musculoskeletal: Normal range of motion.        General: No edema.  Neurological: She is alert and oriented to person, place, and time.  Bilateral pinprick sensation of lower extremities intact    Assessment & Plan:   Hypertension BP Readings from Last 3 Encounters:  08/30/18 134/84  05/24/18 (!) 156/83  12/28/17 (!) 145/82   Patient's BP much more improved but still slightly above goal. Currently on amlodipine 10mg , candesartan 16mg , triamterene-HCTZ 37.5mg -25mg . She has tolerated her current anti-hypertensive well without significant complaints or side effects but she would like to take less medicine if possible. Concern for inadequate control on lower doses but will attempt to help consolidate her medications with amlodipine-valsartan-HCTZ combination pill.  - D/c amlodipine 10mg , candesartan 16mg , triamterene-HCTZ 37.5mg -25mg  - Start amlodipine-valsartan-HCTZ 5-160-25mg  daily - BMP today   Type 2 diabetes mellitus without complication, without long-term current use of insulin (HCC) Significant improvement of her hemoglobin a1c with dietary changes. Current diabetic foot exam normal. Working with dietitian to improve diabetes, especially by switching out sugary snacks for healthy snacks, such as walnuts. Had worsening proteinuria on microalbumin/creatine urine ratio. Currently on candesartan (used to be no losartan prior to the recall)  - C/w carb-controlled diet - Hgb a1c today - Can add on metformin if not improving - Could also consider SGLT-2 inhibitor for her diabetes as she has difficulty managing her blood pressure  POC hgb  a1c improving at 6.7. Will continue to encourage lifestyle modifications and hold off on adding diabetic medications for now.   Patient discussed with Dr. Angelia Mould   -Gilberto Better, PGY1

## 2018-08-31 LAB — BMP8+ANION GAP
Anion Gap: 17 mmol/L (ref 10.0–18.0)
BUN / CREAT RATIO: 14 (ref 12–28)
BUN: 21 mg/dL (ref 8–27)
CO2: 27 mmol/L (ref 20–29)
Calcium: 10.3 mg/dL (ref 8.7–10.3)
Chloride: 100 mmol/L (ref 96–106)
Creatinine, Ser: 1.45 mg/dL — ABNORMAL HIGH (ref 0.57–1.00)
GFR calc Af Amer: 41 mL/min/{1.73_m2} — ABNORMAL LOW (ref >59)
GFR, EST NON AFRICAN AMERICAN: 36 mL/min/{1.73_m2} — AB (ref >59)
Glucose: 114 mg/dL — ABNORMAL HIGH (ref 65–99)
Potassium: 3.8 mmol/L (ref 3.5–5.2)
SODIUM: 144 mmol/L (ref 134–144)

## 2018-09-02 NOTE — Progress Notes (Signed)
Internal Medicine Clinic Attending ° °Case discussed with Dr. Lee at the time of the visit.  We reviewed the resident’s history and exam and pertinent patient test results.  I agree with the assessment, diagnosis, and plan of care documented in the resident’s note.  °

## 2018-12-06 ENCOUNTER — Ambulatory Visit: Payer: Medicare Other | Admitting: Internal Medicine

## 2018-12-06 ENCOUNTER — Other Ambulatory Visit: Payer: Self-pay

## 2018-12-06 ENCOUNTER — Encounter: Payer: Self-pay | Admitting: Internal Medicine

## 2018-12-06 VITALS — BP 153/80 | HR 91 | Temp 98.5°F | Ht 65.0 in | Wt 178.6 lb

## 2018-12-06 DIAGNOSIS — I10 Essential (primary) hypertension: Secondary | ICD-10-CM

## 2018-12-06 DIAGNOSIS — E785 Hyperlipidemia, unspecified: Secondary | ICD-10-CM

## 2018-12-06 DIAGNOSIS — E119 Type 2 diabetes mellitus without complications: Secondary | ICD-10-CM

## 2018-12-06 DIAGNOSIS — Z79899 Other long term (current) drug therapy: Secondary | ICD-10-CM | POA: Diagnosis not present

## 2018-12-06 LAB — POCT GLYCOSYLATED HEMOGLOBIN (HGB A1C): Hemoglobin A1C: 7.7 % — AB (ref 4.0–5.6)

## 2018-12-06 LAB — GLUCOSE, CAPILLARY: Glucose-Capillary: 112 mg/dL — ABNORMAL HIGH (ref 70–99)

## 2018-12-06 NOTE — Progress Notes (Signed)
CC: Hypertension  HPI: [name] is a 73 y.o. F w/ PMH of T2DM, HLD, HTN presenting for management of her chronic conditions. She mentions that over the last 3 months she has been unable to go to the park for her daily walks due to COVID-19. She noted that she observed fellow park goers not respecting social distancing and not wearing face masks, which she was concerned about and she did not want to risk. She states occasionally he goes to her daughter's home to walk on their treadmill but states she misses her walks in the park. She also mentions that she had some difficulty following her low carb diet and eating foods such as baked potatoes.   Past Medical History:  Diagnosis Date  . Diabetes mellitus    Diet-controlled. Not on antiglycemic medications.  Marland Kitchen HLD (hyperlipidemia)   . Hypertension   . Hypokalemia    recurrent  . Seasonal allergies   . Skin complaints 09/15/2017  . Ventral hernia    Incisional ventral hernia. S/P repair by Dr. Hulen Skains  (08/2008)  . Ventricular hypertrophy 05/2006   LVH on ECG (05/2006) - Likely 2/2 HTN.  . 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 Hulen Skains. (08/2006)   Review of Systems: Review of Systems  Constitutional: Negative for chills, fever and malaise/fatigue.  Eyes: Negative for blurred vision.  Respiratory: Negative for shortness of breath.   Cardiovascular: Negative for chest pain and palpitations.  Gastrointestinal: Negative for constipation, diarrhea, nausea and vomiting.  Genitourinary: Negative for frequency.  Neurological: Negative for sensory change and headaches.     Physical Exam: Vitals:   12/06/18 1401 12/06/18 1422  BP: (!) 174/81 (!) 153/80  Pulse: 95 91  Temp: 98.5 F (36.9 C)   TempSrc: Oral   SpO2: 99%   Weight: 178 lb 9.6 oz (81 kg)   Height: 5\' 5"  (1.651 m)     Physical Exam  Constitutional: She is oriented to person, place, and time. She appears  well-developed and well-nourished. No distress.  HENT:  Mouth/Throat: Oropharynx is clear and moist.  Eyes: Conjunctivae are normal. No scleral icterus.  Neck: Normal range of motion. Neck supple.  Cardiovascular: Normal rate, regular rhythm, normal heart sounds and intact distal pulses.  No murmur heard. Respiratory: Effort normal and breath sounds normal. She has no wheezes. She has no rales.  GI: Soft. Bowel sounds are normal. There is no abdominal tenderness.  Musculoskeletal: Normal range of motion.        General: Edema (1+ pitting edema around bilateral ankles) present.  Neurological: She is alert and oriented to person, place, and time.     Assessment & Plan:   Type 2 diabetes mellitus without complication, without long-term current use of insulin (HCC) Presents for management of her diabetes. Mentions decrease in her regular exercise and difficulty w/ adherence to diabetic diet due to limitations associated w/ Covid-19 pandemic. Denies any polyuria, polyphagia, polydipsia. States she has no blurry vision or lower extremity numbness. Hgb a1c today shows worsening glycemic control (6.7->7.7). She states she would like to continue focusing on lifestyle modifications and is resistant to starting metformin. Will continue to monitor for now but discussed in detail about benefits of pharmacological support. Gina Bailey expressed understanding.  - C/w carb-controlled diet - Will need to start metformin if hgb a1c worsen on next visit.  Hypertension BP Readings from Last 3 Encounters:  12/06/18 (!) 153/80  08/30/18  134/84  05/24/18 (!) 156/83   BP above goal. Currently on regimen of amlodipine-valsartan-HCTZ 5-160-25mg  daily. She states she did not take her blood pressure medications this morning as she takes all of her medications later in the day when she knows she will be driving. She may benefit from higher dose of her current regimen but will need to monitor when she is taking her bp  meds. Discussed w/ Gina Bailey about considering taking her daily medications at night time and monitoring bp at home.   - Home blood pressure cuff provided in clinic w/ recommendation to report back home bp readings next week. - C/w amlodipine-valsartan-HCTZ 5-160-25mg  daily - Need to re-check Bmp to assess renal function but requesting holding off on further labs until next visit.     Patient discussed with Dr. Rebeca Alert   -Gilberto Better, PGY1

## 2018-12-06 NOTE — Patient Instructions (Signed)
Thank you for allowing [place] to provide your care today. Today we discussed your diabetes and hypertension  I have ordered no labs for you. I will call if any are abnormal.    Today we made no changes to your medications.    Please follow-up in 3 months.    Should you have any questions or concerns please call the internal medicine clinic at [phone].     Diabetes Mellitus and Nutrition, Adult When you have diabetes (diabetes mellitus), it is very important to have healthy eating habits because your blood sugar (glucose) levels are greatly affected by what you eat and drink. Eating healthy foods in the appropriate amounts, at about the same times every day, can help you:  Control your blood glucose.  Lower your risk of heart disease.  Improve your blood pressure.  Reach or maintain a healthy weight. Every person with diabetes is different, and each person has different needs for a meal plan. Your health care provider may recommend that you work with a diet and nutrition specialist (dietitian) to make a meal plan that is best for you. Your meal plan may vary depending on factors such as:  The calories you need.  The medicines you take.  Your weight.  Your blood glucose, blood pressure, and cholesterol levels.  Your activity level.  Other health conditions you have, such as heart or kidney disease. How do carbohydrates affect me? Carbohydrates, also called carbs, affect your blood glucose level more than any other type of food. Eating carbs naturally raises the amount of glucose in your blood. Carb counting is a method for keeping track of how many carbs you eat. Counting carbs is important to keep your blood glucose at a healthy level, especially if you use insulin or take certain oral diabetes medicines. It is important to know how many carbs you can safely have in each meal. This is different for every person. Your dietitian can help you calculate how many carbs you should have  at each meal and for each snack. Foods that contain carbs include:  Bread, cereal, rice, pasta, and crackers.  Potatoes and corn.  Peas, beans, and lentils.  Milk and yogurt.  Fruit and juice.  Desserts, such as cakes, cookies, ice cream, and candy. How does alcohol affect me? Alcohol can cause a sudden decrease in blood glucose (hypoglycemia), especially if you use insulin or take certain oral diabetes medicines. Hypoglycemia can be a life-threatening condition. Symptoms of hypoglycemia (sleepiness, dizziness, and confusion) are similar to symptoms of having too much alcohol. If your health care provider says that alcohol is safe for you, follow these guidelines:  Limit alcohol intake to no more than 1 drink per day for nonpregnant women and 2 drinks per day for men. One drink equals 12 oz of beer, 5 oz of wine, or 1 oz of hard liquor.  Do not drink on an empty stomach.  Keep yourself hydrated with water, diet soda, or unsweetened iced tea.  Keep in mind that regular soda, juice, and other mixers may contain a lot of sugar and must be counted as carbs. What are tips for following this plan?  Reading food labels  Start by checking the serving size on the "Nutrition Facts" label of packaged foods and drinks. The amount of calories, carbs, fats, and other nutrients listed on the label is based on one serving of the item. Many items contain more than one serving per package.  Check the total grams (g) of carbs  in one serving. You can calculate the number of servings of carbs in one serving by dividing the total carbs by 15. For example, if a food has 30 g of total carbs, it would be equal to 2 servings of carbs.  Check the number of grams (g) of saturated and trans fats in one serving. Choose foods that have low or no amount of these fats.  Check the number of milligrams (mg) of salt (sodium) in one serving. Most people should limit total sodium intake to less than 2,300 mg per day.   Always check the nutrition information of foods labeled as "low-fat" or "nonfat". These foods may be higher in added sugar or refined carbs and should be avoided.  Talk to your dietitian to identify your daily goals for nutrients listed on the label. Shopping  Avoid buying canned, premade, or processed foods. These foods tend to be high in fat, sodium, and added sugar.  Shop around the outside edge of the grocery store. This includes fresh fruits and vegetables, bulk grains, fresh meats, and fresh dairy. Cooking  Use low-heat cooking methods, such as baking, instead of high-heat cooking methods like deep frying.  Cook using healthy oils, such as olive, canola, or sunflower oil.  Avoid cooking with butter, cream, or high-fat meats. Meal planning  Eat meals and snacks regularly, preferably at the same times every day. Avoid going long periods of time without eating.  Eat foods high in fiber, such as fresh fruits, vegetables, beans, and whole grains. Talk to your dietitian about how many servings of carbs you can eat at each meal.  Eat 4-6 ounces (oz) of lean protein each day, such as lean meat, chicken, fish, eggs, or tofu. One oz of lean protein is equal to: ? 1 oz of meat, chicken, or fish. ? 1 egg. ?  cup of tofu.  Eat some foods each day that contain healthy fats, such as avocado, nuts, seeds, and fish. Lifestyle  Check your blood glucose regularly.  Exercise regularly as told by your health care provider. This may include: ? 150 minutes of moderate-intensity or vigorous-intensity exercise each week. This could be brisk walking, biking, or water aerobics. ? Stretching and doing strength exercises, such as yoga or weightlifting, at least 2 times a week.  Take medicines as told by your health care provider.  Do not use any products that contain nicotine or tobacco, such as cigarettes and e-cigarettes. If you need help quitting, ask your health care provider.  Work with a  Social worker or diabetes educator to identify strategies to manage stress and any emotional and social challenges. Questions to ask a health care provider  Do I need to meet with a diabetes educator?  Do I need to meet with a dietitian?  What number can I call if I have questions?  When are the best times to check my blood glucose? Where to find more information:  American Diabetes Association: diabetes.org  Academy of Nutrition and Dietetics: www.eatright.[employer] of Diabetes and Digestive and Kidney Diseases (NIH): [employer] Summary  A healthy meal plan will help you control your blood glucose and maintain a healthy lifestyle.  Working with a diet and nutrition specialist (dietitian) can help you make a meal plan that is best for you.  Keep in mind that carbohydrates (carbs) and alcohol have immediate effects on your blood glucose levels. It is important to count carbs and to use alcohol carefully. This information is not intended to replace advice  given to you by your health care provider. Make sure you discuss any questions you have with your health care provider. Document Released: 04/02/2005 Document Revised: 02/03/2017 Document Reviewed: 08/10/2016 Elsevier Interactive Patient Education  2019 [employer].

## 2018-12-08 ENCOUNTER — Encounter: Payer: Self-pay | Admitting: Internal Medicine

## 2018-12-08 NOTE — Assessment & Plan Note (Signed)
Presents for management of her diabetes. Mentions decrease in her regular exercise and difficulty w/ adherence to diabetic diet due to limitations associated w/ Covid-19 pandemic. Denies any polyuria, polyphagia, polydipsia. States she has no blurry vision or lower extremity numbness. Hgb a1c today shows worsening glycemic control (6.7->7.7). She states she would like to continue focusing on lifestyle modifications and is resistant to starting metformin. Will continue to monitor for now but discussed in detail about benefits of pharmacological support. Gina Bailey expressed understanding.  - C/w carb-controlled diet - Will need to start metformin if hgb a1c worsen on next visit.

## 2018-12-08 NOTE — Assessment & Plan Note (Signed)
BP Readings from Last 3 Encounters:  12/06/18 (!) 153/80  08/30/18 134/84  05/24/18 (!) 156/83   BP above goal. Currently on regimen of amlodipine-valsartan-HCTZ 5-160-25mg  daily. She states she did not take her blood pressure medications this morning as she takes all of her medications later in the day when she knows she will be driving. She may benefit from higher dose of her current regimen but will need to monitor when she is taking her bp meds. Discussed w/ Gina Bailey about considering taking her daily medications at night time and monitoring bp at home.   - Home blood pressure cuff provided in clinic w/ recommendation to report back home bp readings next week. - C/w amlodipine-valsartan-HCTZ 5-160-25mg  daily - Need to re-check Bmp to assess renal function but requesting holding off on further labs until next visit.

## 2018-12-09 NOTE — Progress Notes (Signed)
Internal Medicine Clinic Attending  Case discussed with Dr. Lee at the time of the visit.  We reviewed the resident's history and exam and pertinent patient test results.  I agree with the assessment, diagnosis, and plan of care documented in the resident's note.  Alexander Raines, M.D., Ph.D.  

## 2018-12-13 ENCOUNTER — Ambulatory Visit: Payer: Medicare Other

## 2018-12-13 ENCOUNTER — Other Ambulatory Visit: Payer: Self-pay

## 2018-12-14 ENCOUNTER — Telehealth: Payer: Self-pay | Admitting: Internal Medicine

## 2018-12-14 NOTE — Telephone Encounter (Signed)
Spoke with Gina Bailey about using her blood pressure cuff at home. She states she has not started checking her blood pressure at home because of social issues including funeral today of a close friend. She states she will start daily monitoring of bp once these social issues have resolved.

## 2019-01-12 ENCOUNTER — Other Ambulatory Visit: Payer: Self-pay | Admitting: Internal Medicine

## 2019-01-12 DIAGNOSIS — E119 Type 2 diabetes mellitus without complications: Secondary | ICD-10-CM

## 2019-03-14 ENCOUNTER — Ambulatory Visit (INDEPENDENT_AMBULATORY_CARE_PROVIDER_SITE_OTHER): Payer: Medicare Other | Admitting: Internal Medicine

## 2019-03-14 ENCOUNTER — Encounter: Payer: Self-pay | Admitting: Internal Medicine

## 2019-03-14 ENCOUNTER — Other Ambulatory Visit: Payer: Self-pay

## 2019-03-14 VITALS — BP 134/82 | HR 113 | Temp 99.1°F | Wt 176.0 lb

## 2019-03-14 DIAGNOSIS — Z7984 Long term (current) use of oral hypoglycemic drugs: Secondary | ICD-10-CM

## 2019-03-14 DIAGNOSIS — K5900 Constipation, unspecified: Secondary | ICD-10-CM | POA: Diagnosis not present

## 2019-03-14 DIAGNOSIS — E785 Hyperlipidemia, unspecified: Secondary | ICD-10-CM | POA: Diagnosis not present

## 2019-03-14 DIAGNOSIS — E119 Type 2 diabetes mellitus without complications: Secondary | ICD-10-CM

## 2019-03-14 DIAGNOSIS — Z79899 Other long term (current) drug therapy: Secondary | ICD-10-CM

## 2019-03-14 DIAGNOSIS — I10 Essential (primary) hypertension: Secondary | ICD-10-CM | POA: Diagnosis not present

## 2019-03-14 LAB — GLUCOSE, CAPILLARY: Glucose-Capillary: 150 mg/dL — ABNORMAL HIGH (ref 70–99)

## 2019-03-14 LAB — POCT GLYCOSYLATED HEMOGLOBIN (HGB A1C): Hemoglobin A1C: 7.1 % — AB (ref 4.0–5.6)

## 2019-03-14 MED ORDER — AMLODIPINE-VALSARTAN-HCTZ 10-160-25 MG PO TABS: 1.0000 | ORAL_TABLET | Freq: Every day | ORAL | 0 refills | Status: DC

## 2019-03-14 MED ORDER — METFORMIN HCL ER 500 MG PO TB24: 500.0000 mg | ORAL_TABLET | Freq: Every day | ORAL | 1 refills | Status: DC

## 2019-03-14 NOTE — Progress Notes (Signed)
CC: Hypertension  HPI: Ms.Kareem Lenna Sciara Lonigro is a 73 y.o. F w/ PMH of T2DM, HLD, HTN who presents for management of her chronic conditions.  Since her last visit she has been keeping a meticulous record of her blood pressure with blood pressure cuff that was provided.  She states she initially she had difficulty figuring out how tight her blood pressure cuff should be but her daughter who is a nurse helped her calibrate and she brought her blood pressure log with her today.  She mentions that her blood pressure seems to be improving since late July, early August but she does not know exactly why.  She mentions that she has also been improving her diet to manage her diabetes and she denies any polyuria, polydipsia, polyphagia.  She does mention some intermittent episodes of bloating and has occasional constipation with bowel movement every 2 to 3 days.  She has no other complaints.  Past Medical History:  Diagnosis Date  . Diabetes mellitus    Diet-controlled. Not on antiglycemic medications.  . 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 HLD (hyperlipidemia)   . Hypertension   . Hypokalemia    recurrent  . Left shoulder pain 11/12/2015  . Seasonal allergies   . Skin complaints 09/15/2017  . Ventral hernia    Incisional ventral hernia. S/P repair by Dr. Hulen Skains  (08/2008)  . Ventricular hypertrophy 05/2006   LVH on ECG (05/2006) - Likely 2/2 HTN.  . 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 Hulen Skains. (08/2006)    Review of Systems: Review of Systems  Constitutional: Negative for chills, fever and malaise/fatigue.  Respiratory: Negative for shortness of breath.   Cardiovascular: Negative for chest pain and leg swelling.  Gastrointestinal: Positive for constipation. Negative for abdominal pain, diarrhea, heartburn, nausea and vomiting.  Genitourinary: Negative for dysuria.  Neurological: Negative for dizziness and headaches.  Endo/Heme/Allergies: Negative  for polydipsia.    Physical Exam: Vitals:   03/14/19 1350  BP: 134/82    Physical Exam  Constitutional: She is oriented to person, place, and time. She appears well-developed and well-nourished. No distress.  HENT:  Mouth/Throat: Oropharynx is clear and moist.  Eyes: Conjunctivae are normal.  Cardiovascular: Normal rate, regular rhythm, normal heart sounds and intact distal pulses.  No murmur heard. Respiratory: Effort normal and breath sounds normal. She has no rales.  GI: Soft. Bowel sounds are normal. There is no abdominal tenderness.  Musculoskeletal: Normal range of motion.        General: No edema.  Neurological: She is alert and oriented to person, place, and time.     Assessment & Plan:   Type 2 diabetes mellitus without complication, without long-term current use of insulin (HCC) Presents for management of her diabetes. Continues to lose weight via dietary changes. Denies any significant symptoms such as polyuria, polydipsia or polyphagia. Hgb a1c improved to 7.1 from 7.7 but still in diabetes range. Personal glucometer reviewed with #30 tests in last 30 days. Fasting average 138. Highest recorded bg 163. Discussed initiating metformin therapy. Ms.Diltz agrees with the plan as long as it's once a day and at lowest dose.  - Start metformin-XR 500mg  - F/u in 3 months  Hypertension BP Readings from Last 3 Encounters:  03/14/19 134/82  12/06/18 (!) 153/80  08/30/18 134/84   BP above goal. Meticulous record at home shows consistent bp >814 systolic. Currently on combo (amlodipine-valsartan-HCTZ) 5-160-25mg  daily. Denies any headache, blurry vision,  lower extremity edema.  - Increase dose to amlodipine-valsartan-HCTZ 10-160-25mg  daily - BMP today to assess renal fx    Patient discussed with Dr. Philipp Ovens  -Gilberto Better, [facility] Pager: [phone]

## 2019-03-14 NOTE — Patient Instructions (Addendum)
Thank you for allowing [place] to provide your care today. Today we discussed your diabets   I have ordered bmp labs for you. I will call if any are abnormal.    Today we made the following changes to your medications:    Please start meformin ER 500mg  daily Please start amlodipine-valsartan-HCTZ 10mg  daily  Please follow-up in 3 months.    Should you have any questions or concerns please call the internal medicine clinic at [phone].     DASH Eating Plan DASH stands for "Dietary Approaches to Stop Hypertension." The DASH eating plan is a healthy eating plan that has been shown to reduce high blood pressure (hypertension). It may also reduce your risk for type 2 diabetes, heart disease, and stroke. The DASH eating plan may also help with weight loss. What are tips for following this plan?  General guidelines  Avoid eating more than 2,300 mg (milligrams) of salt (sodium) a day. If you have hypertension, you may need to reduce your sodium intake to 1,500 mg a day.  Limit alcohol intake to no more than 1 drink a day for nonpregnant women and 2 drinks a day for men. One drink equals 12 oz of beer, 5 oz of wine, or 1 oz of hard liquor.  Work with your health care provider to maintain a healthy body weight or to lose weight. Ask what an ideal weight is for you.  Get at least 30 minutes of exercise that causes your heart to beat faster (aerobic exercise) most days of the week. Activities may include walking, swimming, or biking.  Work with your health care provider or diet and nutrition specialist (dietitian) to adjust your eating plan to your individual calorie needs. Reading food labels   Check food labels for the amount of sodium per serving. Choose foods with less than 5 percent of the Daily Value of sodium. Generally, foods with less than 300 mg of sodium per serving fit into this eating plan.  To find whole grains, look for the word "whole" as the first word in the ingredient  list. Shopping  Buy products labeled as "low-sodium" or "no salt added."  Buy fresh foods. Avoid canned foods and premade or frozen meals. Cooking  Avoid adding salt when cooking. Use salt-free seasonings or herbs instead of table salt or sea salt. Check with your health care provider or pharmacist before using salt substitutes.  Do not fry foods. Cook foods using healthy methods such as baking, boiling, grilling, and broiling instead.  Cook with heart-healthy oils, such as olive, canola, soybean, or sunflower oil. Meal planning  Eat a balanced diet that includes: ? 5 or more servings of fruits and vegetables each day. At each meal, try to fill half of your plate with fruits and vegetables. ? Up to 6-8 servings of whole grains each day. ? Less than 6 oz of lean meat, poultry, or fish each day. A 3-oz serving of meat is about the same size as a deck of cards. One egg equals 1 oz. ? 2 servings of low-fat dairy each day. ? A serving of nuts, seeds, or beans 5 times each week. ? Heart-healthy fats. Healthy fats called Omega-3 fatty acids are found in foods such as flaxseeds and coldwater fish, like sardines, salmon, and mackerel.  Limit how much you eat of the following: ? Canned or prepackaged foods. ? Food that is high in trans fat, such as fried foods. ? Food that is high in saturated fat, such as  fatty meat. ? Sweets, desserts, sugary drinks, and other foods with added sugar. ? Full-fat dairy products.  Do not salt foods before eating.  Try to eat at least 2 vegetarian meals each week.  Eat more home-cooked food and less restaurant, buffet, and fast food.  When eating at a restaurant, ask that your food be prepared with less salt or no salt, if possible. What foods are recommended? The items listed may not be a complete list. Talk with your dietitian about what dietary choices are best for you. Grains Whole-grain or whole-wheat bread. Whole-grain or whole-wheat pasta. Brown  rice. [place]. Bulgur. Whole-grain and low-sodium cereals. Pita bread. Low-fat, low-sodium crackers. Whole-wheat flour tortillas. Vegetables Fresh or frozen vegetables (raw, steamed, roasted, or grilled). Low-sodium or reduced-sodium tomato and vegetable juice. Low-sodium or reduced-sodium tomato sauce and tomato paste. Low-sodium or reduced-sodium canned vegetables. Fruits All fresh, dried, or frozen fruit. Canned fruit in natural juice (without added sugar). Meat and other protein foods Skinless chicken or [place]. Ground chicken or [place]. Pork with fat trimmed off. Fish and seafood. Egg whites. Dried beans, peas, or lentils. Unsalted nuts, nut butters, and seeds. Unsalted canned beans. Lean cuts of beef with fat trimmed off. Low-sodium, lean deli meat. Dairy Low-fat (1%) or fat-free (skim) milk. Fat-free, low-fat, or reduced-fat cheeses. Nonfat, low-sodium ricotta or cottage cheese. Low-fat or nonfat yogurt. Low-fat, low-sodium cheese. Fats and oils Soft margarine without trans fats. Vegetable oil. Low-fat, reduced-fat, or light mayonnaise and salad dressings (reduced-sodium). Canola, safflower, olive, soybean, and sunflower oils. Avocado. Seasoning and other foods Herbs. Spices. Seasoning mixes without salt. Unsalted popcorn and pretzels. Fat-free sweets. What foods are not recommended? The items listed may not be a complete list. Talk with your dietitian about what dietary choices are best for you. Grains Baked goods made with fat, such as croissants, muffins, or some breads. Dry pasta or rice meal packs. Vegetables Creamed or fried vegetables. Vegetables in a cheese sauce. Regular canned vegetables (not low-sodium or reduced-sodium). Regular canned tomato sauce and paste (not low-sodium or reduced-sodium). Regular tomato and vegetable juice (not low-sodium or reduced-sodium). Angie Fava. Olives. Fruits Canned fruit in a light or heavy syrup. Fried fruit. Fruit in cream or butter  sauce. Meat and other protein foods Fatty cuts of meat. Ribs. Fried meat. Berniece Salines. Sausage. Bologna and other processed lunch meats. Salami. Fatback. Hotdogs. Bratwurst. Salted nuts and seeds. Canned beans with added salt. Canned or smoked fish. Whole eggs or egg yolks. Chicken or [place] with skin. Dairy Whole or 2% milk, cream, and half-and-half. Whole or full-fat cream cheese. Whole-fat or sweetened yogurt. Full-fat cheese. Nondairy creamers. Whipped toppings. Processed cheese and cheese spreads. Fats and oils Butter. Stick margarine. Lard. Shortening. Ghee. Bacon fat. Tropical oils, such as coconut, palm kernel, or palm oil. Seasoning and other foods Salted popcorn and pretzels. Onion salt, garlic salt, seasoned salt, table salt, and sea salt. Worcestershire sauce. Tartar sauce. Barbecue sauce. Teriyaki sauce. Soy sauce, including reduced-sodium. Steak sauce. Canned and packaged gravies. Fish sauce. Oyster sauce. Cocktail sauce. Horseradish that you find on the shelf. Ketchup. Mustard. Meat flavorings and tenderizers. Bouillon cubes. Hot sauce and Tabasco sauce. Premade or packaged marinades. Premade or packaged taco seasonings. Relishes. Regular salad dressings. Where to find more information:  National Heart, Lung, and [place]: https://wilson-eaton.com/  American Heart Association: www.heart.org Summary  The DASH eating plan is a healthy eating plan that has been shown to reduce high blood pressure (hypertension). It may also reduce your risk for  type 2 diabetes, heart disease, and stroke.  With the DASH eating plan, you should limit salt (sodium) intake to 2,300 mg a day. If you have hypertension, you may need to reduce your sodium intake to 1,500 mg a day.  When on the DASH eating plan, aim to eat more fresh fruits and vegetables, whole grains, lean proteins, low-fat dairy, and heart-healthy fats.  Work with your health care provider or diet and nutrition specialist (dietitian) to adjust  your eating plan to your individual calorie needs. This information is not intended to replace advice given to you by your health care provider. Make sure you discuss any questions you have with your health care provider. Document Released: 06/25/2011 Document Revised: 06/18/2017 Document Reviewed: 06/29/2016 Elsevier Patient Education  2020 [employer].

## 2019-03-14 NOTE — Assessment & Plan Note (Signed)
Presents for management of her diabetes. Continues to lose weight via dietary changes. Denies any significant symptoms such as polyuria, polydipsia or polyphagia. Hgb a1c improved to 7.1 from 7.7 but still in diabetes range. Personal glucometer reviewed with #30 tests in last 30 days. Fasting average 138. Highest recorded bg 163. Discussed initiating metformin therapy. Gina Bailey agrees with the plan as long as it's once a day and at lowest dose.  - Start metformin-XR 500mg  - F/u in 3 months

## 2019-03-14 NOTE — Assessment & Plan Note (Signed)
BP Readings from Last 3 Encounters:  03/14/19 134/82  12/06/18 (!) 153/80  08/30/18 134/84   BP above goal. Meticulous record at home shows consistent bp >569 systolic. Currently on combo (amlodipine-valsartan-HCTZ) 5-160-25mg  daily. Denies any headache, blurry vision, lower extremity edema.  - Increase dose to amlodipine-valsartan-HCTZ 10-160-25mg  daily - BMP today to assess renal fx

## 2019-03-15 ENCOUNTER — Encounter: Payer: Self-pay | Admitting: Internal Medicine

## 2019-03-15 ENCOUNTER — Telehealth: Payer: Self-pay | Admitting: Internal Medicine

## 2019-03-15 DIAGNOSIS — N183 Chronic kidney disease, stage 3 unspecified: Secondary | ICD-10-CM | POA: Insufficient documentation

## 2019-03-15 DIAGNOSIS — E876 Hypokalemia: Secondary | ICD-10-CM

## 2019-03-15 LAB — BMP8+ANION GAP
Anion Gap: 19 mmol/L — ABNORMAL HIGH (ref 10.0–18.0)
BUN/Creatinine Ratio: 14 (ref 12–28)
BUN: 19 mg/dL (ref 8–27)
CO2: 25 mmol/L (ref 20–29)
Calcium: 10.1 mg/dL (ref 8.7–10.3)
Chloride: 102 mmol/L (ref 96–106)
Creatinine, Ser: 1.36 mg/dL — ABNORMAL HIGH (ref 0.57–1.00)
GFR calc Af Amer: 44 mL/min/{1.73_m2} — ABNORMAL LOW (ref >59)
GFR calc non Af Amer: 38 mL/min/{1.73_m2} — ABNORMAL LOW (ref >59)
Glucose: 142 mg/dL — ABNORMAL HIGH (ref 65–99)
Potassium: 3.1 mmol/L — ABNORMAL LOW (ref 3.5–5.2)
Sodium: 146 mmol/L — ABNORMAL HIGH (ref 134–144)

## 2019-03-15 MED ORDER — POTASSIUM CHLORIDE ER 20 MEQ PO TBCR: 1.0000 | EXTENDED_RELEASE_TABLET | Freq: Every day | ORAL | 0 refills | Status: DC

## 2019-03-15 NOTE — Addendum Note (Signed)
Addended by: Jodean Lima on: 03/15/2019 09:23 AM   Modules accepted: Level of Service

## 2019-03-15 NOTE — Telephone Encounter (Signed)
Discussed with [name] on her mobile phone regarding her low potassium. Discussed etiology of her hypokalemia as likely due to HCTZ use and importance of adequate oral potassium repletion. Discussed plan to replete with oral supplementation. [name] expressed understanding and agrees with the plan.

## 2019-03-15 NOTE — Progress Notes (Signed)
Internal Medicine Clinic Attending ° °Case discussed with Dr. Lee at the time of the visit.  We reviewed the resident’s history and exam and pertinent patient test results.  I agree with the assessment, diagnosis, and plan of care documented in the resident’s note.  °

## 2019-04-18 ENCOUNTER — Encounter: Payer: Self-pay | Admitting: *Deleted

## 2019-04-18 LAB — HM DIABETES EYE EXAM

## 2019-06-13 ENCOUNTER — Ambulatory Visit: Payer: Medicare Other | Admitting: Internal Medicine

## 2019-06-13 ENCOUNTER — Encounter: Payer: Self-pay | Admitting: Internal Medicine

## 2019-06-13 ENCOUNTER — Other Ambulatory Visit: Payer: Self-pay

## 2019-06-13 VITALS — BP 132/88 | HR 120 | Temp 98.4°F | Wt 173.6 lb

## 2019-06-13 DIAGNOSIS — E1122 Type 2 diabetes mellitus with diabetic chronic kidney disease: Secondary | ICD-10-CM

## 2019-06-13 DIAGNOSIS — E785 Hyperlipidemia, unspecified: Secondary | ICD-10-CM

## 2019-06-13 DIAGNOSIS — N183 Chronic kidney disease, stage 3 unspecified: Secondary | ICD-10-CM | POA: Diagnosis not present

## 2019-06-13 DIAGNOSIS — I10 Essential (primary) hypertension: Secondary | ICD-10-CM | POA: Diagnosis not present

## 2019-06-13 DIAGNOSIS — Z79899 Other long term (current) drug therapy: Secondary | ICD-10-CM

## 2019-06-13 DIAGNOSIS — E119 Type 2 diabetes mellitus without complications: Secondary | ICD-10-CM

## 2019-06-13 DIAGNOSIS — E1169 Type 2 diabetes mellitus with other specified complication: Secondary | ICD-10-CM | POA: Diagnosis not present

## 2019-06-13 DIAGNOSIS — R140 Abdominal distension (gaseous): Secondary | ICD-10-CM

## 2019-06-13 DIAGNOSIS — Z7984 Long term (current) use of oral hypoglycemic drugs: Secondary | ICD-10-CM

## 2019-06-13 DIAGNOSIS — N1832 Chronic kidney disease, stage 3b: Secondary | ICD-10-CM

## 2019-06-13 DIAGNOSIS — I129 Hypertensive chronic kidney disease with stage 1 through stage 4 chronic kidney disease, or unspecified chronic kidney disease: Secondary | ICD-10-CM | POA: Diagnosis not present

## 2019-06-13 DIAGNOSIS — M7989 Other specified soft tissue disorders: Secondary | ICD-10-CM

## 2019-06-13 LAB — POCT GLYCOSYLATED HEMOGLOBIN (HGB A1C): Hemoglobin A1C: 6.2 % — AB (ref 4.0–5.6)

## 2019-06-13 LAB — GLUCOSE, CAPILLARY: Glucose-Capillary: 93 mg/dL (ref 70–99)

## 2019-06-13 NOTE — Assessment & Plan Note (Addendum)
Lab Results  Component Value Date   CREATININE 1.36 (H) 03/14/2019   CREATININE 1.45 (H) 08/30/2018   CREATININE 1.41 (H) 05/24/2018   Presents w/ hx of Chronic Kidney Disease 3b with GFR of ~40. Creatinine stable at 1.36. Anti-hypertensive regimen include valsartan for nephro-protection. Diabetes and blood pressure well controlled. Discussed possible addition of SGLT-2 inhibitor for further nephro-protective effect as well as reduction in lower extremity edema. Gina Bailey expressed understanding but states she would like to avoid addition of new meds at this time.  - F/u in 3 months - C/w valsartan

## 2019-06-13 NOTE — Patient Instructions (Addendum)
Dear [name],  Thank you for allowing [place] to provide your care today. Today we discussed your diabetes    I have ordered bmp labs for you. I will call if any are abnormal.    Today we made the following changes to your medications:    Please stop your metformin  Please follow-up in 3 months.    Should you have any questions or concerns please call the internal medicine clinic at [phone].    Thank you for choosing [facility].   Diabetes Mellitus and Nutrition, Adult When you have diabetes (diabetes mellitus), it is very important to have healthy eating habits because your blood sugar (glucose) levels are greatly affected by what you eat and drink. Eating healthy foods in the appropriate amounts, at about the same times every day, can help you:  Control your blood glucose.  Lower your risk of heart disease.  Improve your blood pressure.  Reach or maintain a healthy weight. Every person with diabetes is different, and each person has different needs for a meal plan. Your health care provider may recommend that you work with a diet and nutrition specialist (dietitian) to make a meal plan that is best for you. Your meal plan may vary depending on factors such as:  The calories you need.  The medicines you take.  Your weight.  Your blood glucose, blood pressure, and cholesterol levels.  Your activity level.  Other health conditions you have, such as heart or kidney disease. How do carbohydrates affect me? Carbohydrates, also called carbs, affect your blood glucose level more than any other type of food. Eating carbs naturally raises the amount of glucose in your blood. Carb counting is a method for keeping track of how many carbs you eat. Counting carbs is important to keep your blood glucose at a healthy level, especially if you use insulin or take certain oral diabetes medicines. It is important to know how many carbs you can safely have in each meal. This is  different for every person. Your dietitian can help you calculate how many carbs you should have at each meal and for each snack. Foods that contain carbs include:  Bread, cereal, rice, pasta, and crackers.  Potatoes and corn.  Peas, beans, and lentils.  Milk and yogurt.  Fruit and juice.  Desserts, such as cakes, cookies, ice cream, and candy. How does alcohol affect me? Alcohol can cause a sudden decrease in blood glucose (hypoglycemia), especially if you use insulin or take certain oral diabetes medicines. Hypoglycemia can be a life-threatening condition. Symptoms of hypoglycemia (sleepiness, dizziness, and confusion) are similar to symptoms of having too much alcohol. If your health care provider says that alcohol is safe for you, follow these guidelines:  Limit alcohol intake to no more than 1 drink per day for nonpregnant women and 2 drinks per day for men. One drink equals 12 oz of beer, 5 oz of wine, or 1 oz of hard liquor.  Do not drink on an empty stomach.  Keep yourself hydrated with water, diet soda, or unsweetened iced tea.  Keep in mind that regular soda, juice, and other mixers may contain a lot of sugar and must be counted as carbs. What are tips for following this plan?  Reading food labels  Start by checking the serving size on the "Nutrition Facts" label of packaged foods and drinks. The amount of calories, carbs, fats, and other nutrients listed on the label is based on one serving of the  item. Many items contain more than one serving per package.  Check the total grams (g) of carbs in one serving. You can calculate the number of servings of carbs in one serving by dividing the total carbs by 15. For example, if a food has 30 g of total carbs, it would be equal to 2 servings of carbs.  Check the number of grams (g) of saturated and trans fats in one serving. Choose foods that have low or no amount of these fats.  Check the number of milligrams (mg) of salt  (sodium) in one serving. Most people should limit total sodium intake to less than 2,300 mg per day.  Always check the nutrition information of foods labeled as "low-fat" or "nonfat". These foods may be higher in added sugar or refined carbs and should be avoided.  Talk to your dietitian to identify your daily goals for nutrients listed on the label. Shopping  Avoid buying canned, premade, or processed foods. These foods tend to be high in fat, sodium, and added sugar.  Shop around the outside edge of the grocery store. This includes fresh fruits and vegetables, bulk grains, fresh meats, and fresh dairy. Cooking  Use low-heat cooking methods, such as baking, instead of high-heat cooking methods like deep frying.  Cook using healthy oils, such as olive, canola, or sunflower oil.  Avoid cooking with butter, cream, or high-fat meats. Meal planning  Eat meals and snacks regularly, preferably at the same times every day. Avoid going long periods of time without eating.  Eat foods high in fiber, such as fresh fruits, vegetables, beans, and whole grains. Talk to your dietitian about how many servings of carbs you can eat at each meal.  Eat 4-6 ounces (oz) of lean protein each day, such as lean meat, chicken, fish, eggs, or tofu. One oz of lean protein is equal to: ? 1 oz of meat, chicken, or fish. ? 1 egg. ?  cup of tofu.  Eat some foods each day that contain healthy fats, such as avocado, nuts, seeds, and fish. Lifestyle  Check your blood glucose regularly.  Exercise regularly as told by your health care provider. This may include: ? 150 minutes of moderate-intensity or vigorous-intensity exercise each week. This could be brisk walking, biking, or water aerobics. ? Stretching and doing strength exercises, such as yoga or weightlifting, at least 2 times a week.  Take medicines as told by your health care provider.  Do not use any products that contain nicotine or tobacco, such as  cigarettes and e-cigarettes. If you need help quitting, ask your health care provider.  Work with a Social worker or diabetes educator to identify strategies to manage stress and any emotional and social challenges. Questions to ask a health care provider  Do I need to meet with a diabetes educator?  Do I need to meet with a dietitian?  What number can I call if I have questions?  When are the best times to check my blood glucose? Where to find more information:  American Diabetes Association: diabetes.org  Academy of Nutrition and Dietetics: www.eatright.[employer] of Diabetes and Digestive and Kidney Diseases (NIH): [employer] Summary  A healthy meal plan will help you control your blood glucose and maintain a healthy lifestyle.  Working with a diet and nutrition specialist (dietitian) can help you make a meal plan that is best for you.  Keep in mind that carbohydrates (carbs) and alcohol have immediate effects on your blood glucose levels. It  is important to count carbs and to use alcohol carefully. This information is not intended to replace advice given to you by your health care provider. Make sure you discuss any questions you have with your health care provider. Document Released: 04/02/2005 Document Revised: 06/18/2017 Document Reviewed: 08/10/2016 Elsevier Patient Education  2020 [employer].

## 2019-06-13 NOTE — Assessment & Plan Note (Addendum)
Lab Results  Component Value Date   HGBA1C 6.2 (A) 06/13/2019   Presents for further management of her diabetes. Denies polyuria, polydipsia, or polyphagia. Personal glucometer reviewed with #30 tests in last 30 days. Fasting average 102. All recorded readings within range. Hgb a1c improved from 7.1->6.2. Significant improvement compared to prior readings but mentions significant GI side effects. States she would like to discontinue metformin. Discussed alternative therapy options such as SGLT-2 inhibitor or GLP-1 agonist. States that she would like to avoid medication changes at this time. Agreeable to adding on new therapy if her hgb a1c worsens at next visit.  - Return in 3 months - Will need to start SGLT-2 inhibitor or GLP-1 agonist at next visit if hgb a1c worsening

## 2019-06-13 NOTE — Progress Notes (Signed)
CC: Diabetes  HPI: [name] is a 73 y.o. F with PMH of T2DM, HLD, HTN who presents to [facility] for management of her chronic conditions.  She states that she was in her usual state of health until she started using the Metformin.  She mentions that the Metformin has been causing her a lot of abdominal discomfort and bloating.  She denies any diarrhea or constipation but states having significant side effects.  She also mentions that she had a recent appointment with her ophthalmologist who mentions that her vision has significantly worsened since last year.  She discusses plan for Thanksgiving dinner with Gina Bailey, mac & cheese, baked beans. She understands that she would avoid high sugar, high salt diet and states 'the menu is for my kids.'  Past Medical History:  Diagnosis Date  . Diabetes mellitus    Diet-controlled. Not on antiglycemic medications.  . 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 HLD (hyperlipidemia)   . Hypertension   . Hypokalemia    recurrent  . Left shoulder pain 11/12/2015  . Seasonal allergies   . Skin complaints 09/15/2017  . Ventral hernia    Incisional ventral hernia. S/P repair by Dr. Hulen Bailey  (08/2008)  . Ventricular hypertrophy 05/2006   LVH on ECG (05/2006) - Likely 2/2 HTN.  . 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 Hulen Bailey. (08/2006)    Review of Systems: Review of Systems  Constitutional: Negative for chills, fever and malaise/fatigue.  Eyes: Negative for blurred vision.  Respiratory: Negative for shortness of breath.   Cardiovascular: Positive for leg swelling. Negative for chest pain and palpitations.  Gastrointestinal: Positive for abdominal pain (bloating). Negative for constipation, diarrhea, nausea and vomiting.  Musculoskeletal: Negative for back pain, falls and joint pain.  All other systems reviewed and are negative.    Physical Exam: Vitals:   06/13/19 1448 06/13/19 1511  BP: (!) 179/100  132/88  Pulse: (!) 120   Temp: 98.4 F (36.9 C)   TempSrc: Oral   SpO2: 99%   Weight: 173 lb 9.6 oz (78.7 kg)     Physical Exam  Constitutional: She is oriented to person, place, and time. She appears well-developed and well-nourished. No distress.  HENT:  Mouth/Throat: Oropharynx is clear and moist.  Eyes: Conjunctivae are normal.  Cardiovascular: Normal rate, regular rhythm, normal heart sounds and intact distal pulses.  No murmur heard. Respiratory: Effort normal. She has no wheezes. She has no rales.  GI: Soft. Bowel sounds are normal. She exhibits no distension. There is no abdominal tenderness.  Musculoskeletal: Normal range of motion.        General: Edema (1+ pitting ankle edema bilaterally) present.  Neurological: She is Bailey and oriented to person, place, and time.  Skin: Skin is warm and dry.    Assessment & Plan:   CKD (chronic kidney disease) stage 3, GFR 30-59 ml/min (HCC) Lab Results  Component Value Date   CREATININE 1.36 (H) 03/14/2019   CREATININE 1.45 (H) 08/30/2018   CREATININE 1.41 (H) 05/24/2018   Presents w/ hx of Chronic Kidney Disease 3b with GFR of ~40. Creatinine stable at 1.36. Anti-hypertensive regimen include valsartan for nephro-protection. Diabetes and blood pressure well controlled. Discussed possible addition of SGLT-2 inhibitor for further nephro-protective effect as well as reduction in lower extremity edema. [name] expressed understanding but states she would like to avoid addition of new meds at this time.  - F/u in 3 months -  C/w valsartan   Hyperlipidemia associated with type 2 diabetes mellitus (HCC) Lipid Panel     Component Value Date/Time   CHOL 166 05/24/2018 1419   TRIG 73 05/24/2018 1419   HDL 71 05/24/2018 1419   CHOLHDL 2.3 05/24/2018 1419   CHOLHDL 2.3 05/22/2014 1635   VLDL 12 05/22/2014 1635   LDLCALC 80 05/24/2018 1419   LABVLDL 15 05/24/2018 1419   Currently tolerating simvastatin 77m daily (moderate  intensity). Endorsing low saturated fat diet although holiday season is coming up.  ASCVD risk score of 28.3% for 10 year adverse event. Resistant to medication changes.  - Need to up-titrate to higher intensity statin but currently decline medication changes. Will need to reinforce need to switch to atorvastatin or rosuvastatin at future visits.   Type 2 diabetes mellitus without complication, without long-term current use of insulin (HCC) Lab Results  Component Value Date   HGBA1C 6.2 (A) 06/13/2019   Presents for further management of her diabetes. Denies polyuria, polydipsia, or polyphagia. Personal glucometer reviewed with #30 tests in last 30 days. Fasting average 102. All recorded readings within range. Hgb a1c improved from 7.1->6.2. Significant improvement compared to prior readings but mentions significant GI side effects. States she would like to discontinue metformin. Discussed alternative therapy options such as SGLT-2 inhibitor or GLP-1 agonist. States that she would like to avoid medication changes at this time. Agreeable to adding on new therapy if her hgb a1c worsens at next visit.  - Return in 3 months - Will need to start SGLT-2 inhibitor or GLP-1 agonist at next visit if hgb a1c worsening  Hypertension BP Readings from Last 3 Encounters:  06/13/19 132/88  03/14/19 134/82  12/06/18 (!) 153/80   Presents for management of chronic hypertension. Able to provide meticulous home bp readings which show consistent bp in 120-140 range pictured below. Currently on amlodipine-valsartan-HCTZ 10-160-220mdaily, which was increased at last visit.  - Had hypokalemia at prior bmp, will recheck Bmp today - C/w current regimen - Encouraged to avoid salt intake         Patient discussed with Dr. RaRebeca Bailey -Gina BetterPGBedford Hillsnternal Bailey Pager: [phone]

## 2019-06-13 NOTE — Assessment & Plan Note (Addendum)
BP Readings from Last 3 Encounters:  06/13/19 132/88  03/14/19 134/82  12/06/18 (!) 153/80   Presents for management of chronic hypertension. Able to provide meticulous home bp readings which show consistent bp in 120-140 range pictured below. Currently on amlodipine-valsartan-HCTZ 10-160-25mg  daily, which was increased at last visit.  - Had hypokalemia at prior bmp, will recheck Bmp today - C/w current regimen - Encouraged to avoid salt intake

## 2019-06-13 NOTE — Assessment & Plan Note (Signed)
Lipid Panel     Component Value Date/Time   CHOL 166 05/24/2018 1419   TRIG 73 05/24/2018 1419   HDL 71 05/24/2018 1419   CHOLHDL 2.3 05/24/2018 1419   CHOLHDL 2.3 05/22/2014 1635   VLDL 12 05/22/2014 1635   LDLCALC 80 05/24/2018 1419   LABVLDL 15 05/24/2018 1419   Currently tolerating simvastatin 20mg  daily (moderate intensity). Endorsing low saturated fat diet although holiday season is coming up.  ASCVD risk score of 28.3% for 10 year adverse event. Resistant to medication changes.  - Need to up-titrate to higher intensity statin but currently decline medication changes. Will need to reinforce need to switch to atorvastatin or rosuvastatin at future visits.

## 2019-06-14 LAB — BMP8+ANION GAP
Anion Gap: 18 mmol/L (ref 10.0–18.0)
BUN/Creatinine Ratio: 14 (ref 12–28)
BUN: 21 mg/dL (ref 8–27)
CO2: 25 mmol/L (ref 20–29)
Calcium: 10 mg/dL (ref 8.7–10.3)
Chloride: 101 mmol/L (ref 96–106)
Creatinine, Ser: 1.51 mg/dL — ABNORMAL HIGH (ref 0.57–1.00)
GFR calc Af Amer: 39 mL/min/{1.73_m2} — ABNORMAL LOW (ref >59)
GFR calc non Af Amer: 34 mL/min/{1.73_m2} — ABNORMAL LOW (ref >59)
Glucose: 98 mg/dL (ref 65–99)
Potassium: 3.7 mmol/L (ref 3.5–5.2)
Sodium: 144 mmol/L (ref 134–144)

## 2019-06-20 NOTE — Progress Notes (Signed)
Internal Medicine Clinic Attending  Case discussed with Dr. Lee at the time of the visit.  We reviewed the resident's history and exam and pertinent patient test results.  I agree with the assessment, diagnosis, and plan of care documented in the resident's note.  Alexander Raines, M.D., Ph.D.  

## 2019-06-25 ENCOUNTER — Other Ambulatory Visit: Payer: Self-pay | Admitting: Internal Medicine

## 2019-06-25 DIAGNOSIS — I10 Essential (primary) hypertension: Secondary | ICD-10-CM

## 2019-07-10 ENCOUNTER — Other Ambulatory Visit: Payer: Self-pay | Admitting: *Deleted

## 2019-07-10 DIAGNOSIS — E119 Type 2 diabetes mellitus without complications: Secondary | ICD-10-CM

## 2019-07-10 MED ORDER — ACCU-CHEK GUIDE VI STRP: ORAL_STRIP | 3 refills | Status: DC

## 2019-07-17 ENCOUNTER — Other Ambulatory Visit: Payer: Self-pay | Admitting: Internal Medicine

## 2019-07-17 DIAGNOSIS — E785 Hyperlipidemia, unspecified: Secondary | ICD-10-CM

## 2019-07-17 DIAGNOSIS — E1169 Type 2 diabetes mellitus with other specified complication: Secondary | ICD-10-CM

## 2019-07-18 ENCOUNTER — Other Ambulatory Visit: Payer: Self-pay | Admitting: Internal Medicine

## 2019-07-18 DIAGNOSIS — Z1231 Encounter for screening mammogram for malignant neoplasm of breast: Secondary | ICD-10-CM

## 2019-08-09 ENCOUNTER — Ambulatory Visit: Payer: Medicare PPO | Attending: Internal Medicine

## 2019-08-09 DIAGNOSIS — Z23 Encounter for immunization: Secondary | ICD-10-CM | POA: Insufficient documentation

## 2019-08-09 NOTE — Progress Notes (Signed)
   Covid-19 Vaccination Clinic  Name:  Gina Bailey    MRN: [id number] DOB: Dec 20, 1945  08/09/2019  [name] was observed post Covid-19 immunization for 15 minutes without incidence. She was provided with Vaccine Information Sheet and instruction to access the V-Safe system.   [name] was instructed to call 911 with any severe reactions post vaccine: Marland Kitchen Difficulty breathing  . Swelling of your face and throat  . A fast heartbeat  . A bad rash all over your body  . Dizziness and weakness    Immunizations Administered    Name Date Dose VIS Date Route   Pfizer COVID-19 Vaccine 08/09/2019  4:50 PM 0.3 mL 06/30/2019 Intramuscular   Manufacturer: [place]   Lot: [id number]   [place]: [id number]

## 2019-08-29 ENCOUNTER — Ambulatory Visit
Admission: RE | Admit: 2019-08-29 | Discharge: 2019-08-29 | Disposition: A | Discharge status: Home / Self Care | Payer: Medicare Other | Source: Ambulatory Visit | Attending: Family Medicine | Admitting: Family Medicine

## 2019-08-29 ENCOUNTER — Other Ambulatory Visit: Payer: Self-pay

## 2019-08-29 DIAGNOSIS — Z1231 Encounter for screening mammogram for malignant neoplasm of breast: Secondary | ICD-10-CM

## 2019-08-30 ENCOUNTER — Ambulatory Visit: Payer: Medicare PPO | Attending: Internal Medicine

## 2019-08-30 DIAGNOSIS — Z23 Encounter for immunization: Secondary | ICD-10-CM | POA: Insufficient documentation

## 2019-08-30 NOTE — Progress Notes (Signed)
   Covid-19 Vaccination Clinic  Name:  Gina Bailey    MRN: [id number] DOB: 03/26/1946  08/30/2019  Gina Bailey was observed post Covid-19 immunization for 15 minutes without incidence. She was provided with Vaccine Information Sheet and instruction to access the V-Safe system.   Gina Bailey was instructed to call 911 with any severe reactions post vaccine: Marland Kitchen Difficulty breathing  . Swelling of your face and throat  . A fast heartbeat  . A bad rash all over your body  . Dizziness and weakness    Immunizations Administered    Name Date Dose VIS Date Route   Pfizer COVID-19 Vaccine 08/30/2019  8:18 AM 0.3 mL 06/30/2019 Intramuscular   Manufacturer: [place]   Lot: [id number]   [place]: [id number]

## 2019-08-31 ENCOUNTER — Other Ambulatory Visit: Payer: Self-pay | Admitting: Family Medicine

## 2019-08-31 ENCOUNTER — Other Ambulatory Visit: Payer: Self-pay | Admitting: Internal Medicine

## 2019-08-31 DIAGNOSIS — R928 Other abnormal and inconclusive findings on diagnostic imaging of breast: Secondary | ICD-10-CM

## 2019-09-13 ENCOUNTER — Ambulatory Visit
Admission: RE | Admit: 2019-09-13 | Discharge: 2019-09-13 | Disposition: A | Discharge status: Home / Self Care | Payer: Medicare PPO | Source: Ambulatory Visit | Attending: Family Medicine | Admitting: Family Medicine

## 2019-09-13 ENCOUNTER — Other Ambulatory Visit: Payer: Self-pay

## 2019-09-13 DIAGNOSIS — N6012 Diffuse cystic mastopathy of left breast: Secondary | ICD-10-CM | POA: Diagnosis not present

## 2019-09-13 DIAGNOSIS — R928 Other abnormal and inconclusive findings on diagnostic imaging of breast: Secondary | ICD-10-CM | POA: Diagnosis not present

## 2019-09-19 ENCOUNTER — Ambulatory Visit: Payer: Medicare PPO | Admitting: Internal Medicine

## 2019-09-19 ENCOUNTER — Other Ambulatory Visit: Payer: Self-pay

## 2019-09-19 VITALS — BP 128/76 | HR 100 | Temp 98.3°F | Ht 65.0 in | Wt 174.1 lb

## 2019-09-19 DIAGNOSIS — E119 Type 2 diabetes mellitus without complications: Secondary | ICD-10-CM | POA: Diagnosis not present

## 2019-09-19 DIAGNOSIS — N183 Chronic kidney disease, stage 3 unspecified: Secondary | ICD-10-CM

## 2019-09-19 DIAGNOSIS — E785 Hyperlipidemia, unspecified: Secondary | ICD-10-CM | POA: Diagnosis not present

## 2019-09-19 DIAGNOSIS — Z79899 Other long term (current) drug therapy: Secondary | ICD-10-CM

## 2019-09-19 DIAGNOSIS — I10 Essential (primary) hypertension: Secondary | ICD-10-CM

## 2019-09-19 DIAGNOSIS — E1169 Type 2 diabetes mellitus with other specified complication: Secondary | ICD-10-CM

## 2019-09-19 DIAGNOSIS — R911 Solitary pulmonary nodule: Secondary | ICD-10-CM | POA: Diagnosis not present

## 2019-09-19 DIAGNOSIS — E1122 Type 2 diabetes mellitus with diabetic chronic kidney disease: Secondary | ICD-10-CM

## 2019-09-19 DIAGNOSIS — I129 Hypertensive chronic kidney disease with stage 1 through stage 4 chronic kidney disease, or unspecified chronic kidney disease: Secondary | ICD-10-CM

## 2019-09-19 DIAGNOSIS — N1832 Chronic kidney disease, stage 3b: Secondary | ICD-10-CM

## 2019-09-19 LAB — POCT GLYCOSYLATED HEMOGLOBIN (HGB A1C): Hemoglobin A1C: 7.2 % — AB (ref 4.0–5.6)

## 2019-09-19 LAB — GLUCOSE, CAPILLARY: Glucose-Capillary: 135 mg/dL — ABNORMAL HIGH (ref 70–99)

## 2019-09-19 MED ORDER — SIMVASTATIN 20 MG PO TABS: 20.0000 mg | ORAL_TABLET | Freq: Every day | ORAL | 3 refills | Status: DC

## 2019-09-19 MED ORDER — AMLODIPINE-VALSARTAN-HCTZ 10-160-25 MG PO TABS: 1.0000 | ORAL_TABLET | Freq: Every day | ORAL | 3 refills | Status: DC

## 2019-09-19 NOTE — Progress Notes (Signed)
CC: Diabetes  HPI: Gina Bailey is a 74 y.o. F w/ PMH of HLD, HTN, DM2 presenting for management of her chronic conditions. She mentions that over the last couple months, she had to decrease her physical activity due to the surge of COVID and poor weather and she has led a sedentary life. She mentions that she is motivated to resume her regular exercise with the warmer weather. She mentions being worked-up for breast during her routine mammogram after a possible mass was found but she had a follow up ultrasound which suggested benign cyst and was told to follow up in 1 year.   Past Medical History:  Diagnosis Date  . Diabetes mellitus    Diet-controlled. Not on antiglycemic medications.  . 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 HLD (hyperlipidemia)   . Hypertension   . Hypokalemia    recurrent  . Left shoulder pain 11/12/2015  . Seasonal allergies   . Skin complaints 09/15/2017  . Ventral hernia    Incisional ventral hernia. S/P repair by Dr. Hulen Skains  (08/2008)  . Ventricular hypertrophy 05/2006   LVH on ECG (05/2006) - Likely 2/2 HTN.  . 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 Hulen Skains. (08/2006)    Review of Systems: Review of Systems  Constitutional: Negative for chills, fever and malaise/fatigue.  Eyes: Negative for blurred vision and double vision.  Respiratory: Negative for cough and shortness of breath.   Cardiovascular: Negative for chest pain and leg swelling.  Gastrointestinal: Negative for constipation, diarrhea, nausea and vomiting.  Neurological: Negative for headaches.     Physical Exam: Vitals:   09/19/19 1338 09/19/19 1405  BP: (!) 147/83 128/76  Pulse: 100   Temp: 98.3 F (36.8 C)   TempSrc: Oral   SpO2: 98%   Weight: 174 lb 1.6 oz (79 kg)   Height: 5\' 5"  (1.651 m)     Physical Exam  Constitutional: She is oriented to person, place, and time. She appears well-developed and well-nourished. No distress.    HENT:  Mouth/Throat: Oropharynx is clear and moist.  Eyes: Conjunctivae are normal. No scleral icterus.  Cardiovascular: Normal rate, regular rhythm, normal heart sounds and intact distal pulses.  No murmur heard. Respiratory: Effort normal and breath sounds normal. She has no wheezes. She has no rales.  GI: Soft. Bowel sounds are normal. She exhibits no distension. There is no abdominal tenderness.  Musculoskeletal:        General: Edema (trace ankle edema) present. Normal range of motion.  Neurological: She is alert and oriented to person, place, and time.     Assessment & Plan:   Type 2 diabetes mellitus without complication, without long-term current use of insulin (HCC) Lab Results  Component Value Date   HGBA1C 7.2 (A) 09/19/2019   Presents for management of diet controlled T2DM. Had worsening glycemic control over holiday season and winter weathers with hgb a1c rising from 6.1. to 7.2. Discussed at length again regarding treating with medication vs lifestyle modifications. Gina Bailey states she would like to continue with lifestyle modifications for now.   - C/w diet and exercise - F/u in 3 months  Hypertension BP Readings from Last 3 Encounters:  09/19/19 128/76  06/13/19 132/88  03/14/19 134/82   Presents for management of chronic hypertension. Continues to take regular home bp readings which show consistent BP in 120-140 range pictured below. On amlodipine-valsartan-HCTZ 10-160-25mg  and tolerating it well. Denies any chest pain, blurry  vision, headaches.  - C/w current regimen        Solitary pulmonary nodule Had previous findings of pulmonary nodule in 2016 with also fibrosis noted with recommendation to f/u w/ CT chest in 2017. Noted that she has not yet had repeat imaging. Currently denies any respiratory distress, dyspnea or cough. Discussed indication for CT chest to assess for pulmonary fibrosis. Gina Bailey states she would like to 'think about it' at the  time. Information provided regarding pulmonary fibrosis.  - Defer CT chest per patient preference  CKD (chronic kidney disease) stage 3, GFR 30-59 ml/min (HCC) Lab Results  Component Value Date   CREATININE 1.51 (H) 06/13/2019   Discussed with Gina Bailey regarding option of starting SGLT-2 inhibitor for her indication for chronic kidney disease as well as her diabetes mellitus. Discussed benefits of reducing progression of renal disease as well as better glycemic control. Also discussed risk of urinary tract infection. Gina Bailey expressed understanding and stated she would like to avoid adding any ''additional medications'' to her regimen at this time. All other questions, concerns addressed.  - F/u in 3 months    Patient discussed with Dr. Daryll Drown   -Gilberto Better, PGY2 [facility] Pager: [phone]

## 2019-09-19 NOTE — Patient Instructions (Signed)
Thank you for allowing [place] to provide your care today. Today we discussed your recent lab results  I have ordered hgb a1c labs for you and we have reviewed them together.    Today we made no changes to your medications.    Please follow-up in 3 months.    Should you have any questions or concerns please call the internal medicine clinic at [phone].     Preventing Pulmonary Fibrosis Pulmonary fibrosis is scarring, stiffening, and thickening (fibrosis) that develops over time in lung tissue (pulmonary tissue). At first, this may cause shortness of breath and a dry cough. Over time, symptoms often become more serious and complications may occur. There are many different causes of pulmonary fibrosis. Sometimes the cause is not known. You may not be able to entirely prevent pulmonary fibrosis, but you may be able to reduce your risk by:  Avoiding pollutants.  Protecting your lungs.  Making lifestyle changes.  Managing conditions that may lead to pulmonary fibrosis. How can pulmonary fibrosis affect me? Pulmonary fibrosis is a serious condition. Once you have this condition, it tends to get worse (progress) gradually over several years and may lead to:  Collapsed lung.  Infection of your lungs (pneumonia).  Blood clots in the lungs.  Lung cancer.  Inability of your heart to pump blood (heart failure).  A condition in which the lungs cannot take in enough oxygen or remove excess carbon dioxide (respiratory failure). What actions can I take to prevent this condition? Avoid pollutants Protect yourself from breathing harmful dusts. Harmful dusts include grain, wood, soil, coal, silica, and asbestos. To protect yourself from these dusts:  Know which types of dust you may be exposed to.  Avoid working in dusty areas when possible.  Work in an area with good air circulation (ventilation).  Wet down work areas to reduce dust when possible.  Use an approved respirator mask.  Do  not eat, drink, or take breaks in dusty areas.  Wash your hands and face or shower after work.  Change into clean clothes before going home.  Do not park your car near areas that may be affected by dust. Protect your lungs Take steps to protect yourself from lung infections:  Stay up to date on all vaccines. This includes the flu (influenza) and pneumonia (pneumococcal) vaccines.  Avoid contact with people who are sick with flu or cold symptoms.  Wash your hands often.  Use a disinfectant to regularly clean surfaces at home and at work.  Get 7-8 hours of sleep every night.  Eat a healthy diet that includes plenty of fruits and vegetables, whole grains, and healthy sources of protein, such as poultry, fish, nuts, and low-fat dairy.  Exercise at least 30 minutes most days of the week.  Make lifestyle changes   Do not use any products that contain nicotine or tobacco, such as cigarettes and e-cigarettes. If you need help quitting, ask your health care provider.  Avoid secondhand smoke. General information  If you have heartburn, or GERD, work with your health care provider to treat your condition. This may include taking medicines and making changes to your diet.  Ask your health care provider whether your medicines put you at risk for pulmonary fibrosis. This is especially important if you have seizures, heart disease, or cancer. Talk with your health care provider about other treatments that may work for you. Where to find more information Learn more about pulmonary fibrosis from:  American Lung Association: lung.org  Pulmonary  Fibrosis Foundation: pulmonaryfibrosis.org Summary  You may be able to lower your risk for pulmonary fibrosis by avoiding pollutants, protecting your lungs, making lifestyle changes, and managing conditions that may lead to pulmonary fibrosis.  If you work or spend time around harmful dusts, take precautions to avoid inhaling dust.  Protecting  your lungs from infections can help you stay healthy and may reduce your risk of pulmonary fibrosis.  Quitting smoking can help reduce the risk of pulmonary fibrosis and other serious lung conditions. This information is not intended to replace advice given to you by your health care provider. Make sure you discuss any questions you have with your health care provider. Document Revised: 02/09/2019 Document Reviewed: 09/27/2017 Elsevier Patient Education  [place].

## 2019-09-19 NOTE — Assessment & Plan Note (Addendum)
Lab Results  Component Value Date   HGBA1C 7.2 (A) 09/19/2019   Presents for management of diet controlled T2DM. Had worsening glycemic control over holiday season and winter weathers with hgb a1c rising from 6.1. to 7.2. Discussed at length again regarding treating with medication vs lifestyle modifications. Ms.Convery states she would like to continue with lifestyle modifications for now.   - C/w diet and exercise - F/u in 3 months

## 2019-09-21 ENCOUNTER — Encounter: Payer: Self-pay | Admitting: Internal Medicine

## 2019-09-21 NOTE — Assessment & Plan Note (Addendum)
BP Readings from Last 3 Encounters:  09/19/19 128/76  06/13/19 132/88  03/14/19 134/82   Presents for management of chronic hypertension. Continues to take regular home bp readings which show consistent BP in 120-140 range pictured below. On amlodipine-valsartan-HCTZ 10-160-25mg  and tolerating it well. Denies any chest pain, blurry vision, headaches.  - C/w current regimen

## 2019-09-21 NOTE — Assessment & Plan Note (Signed)
Lab Results  Component Value Date   CREATININE 1.51 (H) 06/13/2019   Discussed with Ms.Zidek regarding option of starting SGLT-2 inhibitor for her indication for chronic kidney disease as well as her diabetes mellitus. Discussed benefits of reducing progression of renal disease as well as better glycemic control. Also discussed risk of urinary tract infection. Ms.Gali expressed understanding and stated she would like to avoid adding any ''additional medications'' to her regimen at this time. All other questions, concerns addressed.  - F/u in 3 months

## 2019-09-21 NOTE — Assessment & Plan Note (Signed)
Had previous findings of pulmonary nodule in 2016 with also fibrosis noted with recommendation to f/u w/ CT chest in 2017. Noted that she has not yet had repeat imaging. Currently denies any respiratory distress, dyspnea or cough. Discussed indication for CT chest to assess for pulmonary fibrosis. Ms.Landen states she would like to 'think about it' at the time. Information provided regarding pulmonary fibrosis.  - Defer CT chest per patient preference

## 2019-09-27 NOTE — Progress Notes (Signed)
Internal Medicine Clinic Attending ° °Case discussed with Dr. Lee at the time of the visit.  We reviewed the resident’s history and exam and pertinent patient test results.  I agree with the assessment, diagnosis, and plan of care documented in the resident’s note.  °

## 2019-10-28 DIAGNOSIS — E785 Hyperlipidemia, unspecified: Secondary | ICD-10-CM | POA: Diagnosis not present

## 2019-10-28 DIAGNOSIS — I10 Essential (primary) hypertension: Secondary | ICD-10-CM | POA: Diagnosis not present

## 2019-10-28 DIAGNOSIS — E119 Type 2 diabetes mellitus without complications: Secondary | ICD-10-CM | POA: Diagnosis not present

## 2019-10-28 DIAGNOSIS — Z91018 Allergy to other foods: Secondary | ICD-10-CM | POA: Diagnosis not present

## 2019-10-28 DIAGNOSIS — Z6830 Body mass index (BMI) 30.0-30.9, adult: Secondary | ICD-10-CM | POA: Diagnosis not present

## 2019-10-28 DIAGNOSIS — Z803 Family history of malignant neoplasm of breast: Secondary | ICD-10-CM | POA: Diagnosis not present

## 2019-10-28 DIAGNOSIS — E669 Obesity, unspecified: Secondary | ICD-10-CM | POA: Diagnosis not present

## 2019-12-26 ENCOUNTER — Ambulatory Visit: Payer: Medicare PPO | Admitting: Internal Medicine

## 2019-12-26 ENCOUNTER — Other Ambulatory Visit: Payer: Self-pay

## 2019-12-26 VITALS — BP 148/86 | HR 103 | Temp 98.8°F | Ht 65.0 in | Wt 172.7 lb

## 2019-12-26 DIAGNOSIS — I10 Essential (primary) hypertension: Secondary | ICD-10-CM

## 2019-12-26 DIAGNOSIS — N1832 Chronic kidney disease, stage 3b: Secondary | ICD-10-CM

## 2019-12-26 DIAGNOSIS — E1169 Type 2 diabetes mellitus with other specified complication: Secondary | ICD-10-CM

## 2019-12-26 DIAGNOSIS — I129 Hypertensive chronic kidney disease with stage 1 through stage 4 chronic kidney disease, or unspecified chronic kidney disease: Secondary | ICD-10-CM | POA: Diagnosis not present

## 2019-12-26 DIAGNOSIS — E119 Type 2 diabetes mellitus without complications: Secondary | ICD-10-CM | POA: Diagnosis not present

## 2019-12-26 DIAGNOSIS — R911 Solitary pulmonary nodule: Secondary | ICD-10-CM | POA: Diagnosis not present

## 2019-12-26 DIAGNOSIS — Z780 Asymptomatic menopausal state: Secondary | ICD-10-CM

## 2019-12-26 DIAGNOSIS — E785 Hyperlipidemia, unspecified: Secondary | ICD-10-CM

## 2019-12-26 LAB — GLUCOSE, CAPILLARY: Glucose-Capillary: 134 mg/dL — ABNORMAL HIGH (ref 70–99)

## 2019-12-26 LAB — POCT GLYCOSYLATED HEMOGLOBIN (HGB A1C): Hemoglobin A1C: 6.9 % — AB (ref 4.0–5.6)

## 2019-12-26 MED ORDER — CALCIUM CARBONATE-VITAMIN D 600-400 MG-UNIT PO TABS: 1.0000 | ORAL_TABLET | Freq: Two times a day (BID) | ORAL | 5 refills | Status: DC

## 2019-12-26 MED ORDER — ACCU-CHEK FASTCLIX LANCETS MISC: 0 refills | Status: DC

## 2019-12-26 MED ORDER — ACCU-CHEK NANO SMARTVIEW W/DEVICE KIT: 1.0000 | PACK | 3 refills | Status: DC

## 2019-12-26 MED ORDER — AMLODIPINE-VALSARTAN-HCTZ 10-160-25 MG PO TABS: 1.0000 | ORAL_TABLET | Freq: Every day | ORAL | 3 refills | Status: DC

## 2019-12-26 MED ORDER — SIMVASTATIN 20 MG PO TABS: 20.0000 mg | ORAL_TABLET | Freq: Every day | ORAL | 3 refills | Status: DC

## 2019-12-26 MED ORDER — ACCU-CHEK GUIDE VI STRP: ORAL_STRIP | 3 refills | Status: DC

## 2019-12-26 NOTE — Patient Instructions (Signed)
Thank you for allowing [place] to provide your care today. Today we discussed your blood pressure and diabetes    I have ordered no labs for you. I will call if any are abnormal.    Today we made no changes to your medications.    Please follow-up in 6 months.    Should you have any questions or concerns please call the internal medicine clinic at [phone].     Diabetes Mellitus and Nutrition, Adult When you have diabetes (diabetes mellitus), it is very important to have healthy eating habits because your blood sugar (glucose) levels are greatly affected by what you eat and drink. Eating healthy foods in the appropriate amounts, at about the same times every day, can help you:  Control your blood glucose.  Lower your risk of heart disease.  Improve your blood pressure.  Reach or maintain a healthy weight. Every person with diabetes is different, and each person has different needs for a meal plan. Your health care provider may recommend that you work with a diet and nutrition specialist (dietitian) to make a meal plan that is best for you. Your meal plan may vary depending on factors such as:  The calories you need.  The medicines you take.  Your weight.  Your blood glucose, blood pressure, and cholesterol levels.  Your activity level.  Other health conditions you have, such as heart or kidney disease. How do carbohydrates affect me? Carbohydrates, also called carbs, affect your blood glucose level more than any other type of food. Eating carbs naturally raises the amount of glucose in your blood. Carb counting is a method for keeping track of how many carbs you eat. Counting carbs is important to keep your blood glucose at a healthy level, especially if you use insulin or take certain oral diabetes medicines. It is important to know how many carbs you can safely have in each meal. This is different for every person. Your dietitian can help you calculate how many carbs you should  have at each meal and for each snack. Foods that contain carbs include:  Bread, cereal, rice, pasta, and crackers.  Potatoes and corn.  Peas, beans, and lentils.  Milk and yogurt.  Fruit and juice.  Desserts, such as cakes, cookies, ice cream, and candy. How does alcohol affect me? Alcohol can cause a sudden decrease in blood glucose (hypoglycemia), especially if you use insulin or take certain oral diabetes medicines. Hypoglycemia can be a life-threatening condition. Symptoms of hypoglycemia (sleepiness, dizziness, and confusion) are similar to symptoms of having too much alcohol. If your health care provider says that alcohol is safe for you, follow these guidelines:  Limit alcohol intake to no more than 1 drink per day for nonpregnant women and 2 drinks per day for men. One drink equals 12 oz of beer, 5 oz of wine, or 1 oz of hard liquor.  Do not drink on an empty stomach.  Keep yourself hydrated with water, diet soda, or unsweetened iced tea.  Keep in mind that regular soda, juice, and other mixers may contain a lot of sugar and must be counted as carbs. What are tips for following this plan?  Reading food labels  Start by checking the serving size on the "Nutrition Facts" label of packaged foods and drinks. The amount of calories, carbs, fats, and other nutrients listed on the label is based on one serving of the item. Many items contain more than one serving per package.  Check the total grams (  g) of carbs in one serving. You can calculate the number of servings of carbs in one serving by dividing the total carbs by 15. For example, if a food has 30 g of total carbs, it would be equal to 2 servings of carbs.  Check the number of grams (g) of saturated and trans fats in one serving. Choose foods that have low or no amount of these fats.  Check the number of milligrams (mg) of salt (sodium) in one serving. Most people should limit total sodium intake to less than 2,300 mg per  day.  Always check the nutrition information of foods labeled as "low-fat" or "nonfat". These foods may be higher in added sugar or refined carbs and should be avoided.  Talk to your dietitian to identify your daily goals for nutrients listed on the label. Shopping  Avoid buying canned, premade, or processed foods. These foods tend to be high in fat, sodium, and added sugar.  Shop around the outside edge of the grocery store. This includes fresh fruits and vegetables, bulk grains, fresh meats, and fresh dairy. Cooking  Use low-heat cooking methods, such as baking, instead of high-heat cooking methods like deep frying.  Cook using healthy oils, such as olive, canola, or sunflower oil.  Avoid cooking with butter, cream, or high-fat meats. Meal planning  Eat meals and snacks regularly, preferably at the same times every day. Avoid going long periods of time without eating.  Eat foods high in fiber, such as fresh fruits, vegetables, beans, and whole grains. Talk to your dietitian about how many servings of carbs you can eat at each meal.  Eat 4-6 ounces (oz) of lean protein each day, such as lean meat, chicken, fish, eggs, or tofu. One oz of lean protein is equal to: ? 1 oz of meat, chicken, or fish. ? 1 egg. ?  cup of tofu.  Eat some foods each day that contain healthy fats, such as avocado, nuts, seeds, and fish. Lifestyle  Check your blood glucose regularly.  Exercise regularly as told by your health care provider. This may include: ? 150 minutes of moderate-intensity or vigorous-intensity exercise each week. This could be brisk walking, biking, or water aerobics. ? Stretching and doing strength exercises, such as yoga or weightlifting, at least 2 times a week.  Take medicines as told by your health care provider.  Do not use any products that contain nicotine or tobacco, such as cigarettes and e-cigarettes. If you need help quitting, ask your health care provider.  Work with  a Social worker or diabetes educator to identify strategies to manage stress and any emotional and social challenges. Questions to ask a health care provider  Do I need to meet with a diabetes educator?  Do I need to meet with a dietitian?  What number can I call if I have questions?  When are the best times to check my blood glucose? Where to find more information:  American Diabetes Association: diabetes.org  Academy of Nutrition and Dietetics: www.eatright.[employer] of Diabetes and Digestive and Kidney Diseases (NIH): [employer] Summary  A healthy meal plan will help you control your blood glucose and maintain a healthy lifestyle.  Working with a diet and nutrition specialist (dietitian) can help you make a meal plan that is best for you.  Keep in mind that carbohydrates (carbs) and alcohol have immediate effects on your blood glucose levels. It is important to count carbs and to use alcohol carefully. This information is not intended  to replace advice given to you by your health care provider. Make sure you discuss any questions you have with your health care provider. Document Revised: 06/18/2017 Document Reviewed: 08/10/2016 Elsevier Patient Education  2020 [employer].

## 2019-12-27 NOTE — Progress Notes (Signed)
CC: Hypertension  HPI: Gina Bailey is a 74 y.o. with PMH listed below presenting with complaint of hypertension. Please see problem based assessment and plan for further details.  Past Medical History:  Diagnosis Date  . Diabetes mellitus    Diet-controlled. Not on antiglycemic medications.  . 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 HLD (hyperlipidemia)   . Hypertension   . Hypokalemia    recurrent  . Left shoulder pain 11/12/2015  . Seasonal allergies   . Skin complaints 09/15/2017  . Ventral hernia    Incisional ventral hernia. S/P repair by Dr. Hulen Skains  (08/2008)  . Ventricular hypertrophy 05/2006   LVH on ECG (05/2006) - Likely 2/2 HTN.  . 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 Hulen Skains. (08/2006)   Review of Systems: Review of Systems  Constitutional: Negative for chills, fever and malaise/fatigue.  Eyes: Negative for blurred vision.  Respiratory: Negative for cough and shortness of breath.   Cardiovascular: Negative for chest pain and palpitations.  Gastrointestinal: Negative for constipation, diarrhea, nausea and vomiting.  Neurological: Negative for dizziness, tingling, tremors and headaches.  All other systems reviewed and are negative.   Physical Exam: Vitals:   12/26/19 1407 12/26/19 1443  BP: (!) 168/95 (!) 148/86  Pulse: (!) 103   Temp: 98.8 F (37.1 C)   TempSrc: Oral   SpO2: 99%   Weight: 172 lb 11.2 oz (78.3 kg)   Height: 5\' 5"  (1.651 m)     Physical Exam  Constitutional: She is oriented to person, place, and time. No distress.  HENT:  Head: Normocephalic and atraumatic.  Mouth/Throat: Mucous membranes are moist. Oropharynx is clear.  Cardiovascular: Normal rate, regular rhythm, normal heart sounds and normal pulses.  No murmur heard. Respiratory: Effort normal and breath sounds normal. No respiratory distress. She has no wheezes.  GI: Normal appearance.  Musculoskeletal:        General: Swelling  (Trace ankle edema bilaterally) present. Normal range of motion.     Cervical back: Normal range of motion.  Neurological: She is alert and oriented to person, place, and time.  Skin: Skin is warm and dry.     Assessment & Plan:   Hypertension BP Readings from Last 3 Encounters:  12/26/19 (!) 148/86  09/19/19 128/76  06/13/19 132/88   GinaBailey is a 74 yo F w/ PMH of HTN, T2DM, HLD and CKD3 who presents to [facility] for management of her chronic conditions. She mentions that she has not been able to pick up her blood pressure medications as she have mistakenly believed it was sent to the wrong pharmacy. She requests that her meds be sent to [facility] [place]. She denies any chest pain, blurry vision, dyspnea, edema.  A/P BP uncontrolled. Exacerbated by medication non-adherence. Refills sent to correct pharmacy. - C/w amlodipine-valsartan-hctz 10-160-25mg  daily - Check bmp next visit  Type 2 diabetes mellitus without complication, without long-term current use of insulin ([place]) Lab Results  Component Value Date   HGBA1C 6.9 (A) 12/26/2019   Continues to manage w/ diet only. Slightly better hgb a1c at 6.9. Considering advanced age, meeting goal. Continues to wish to focus on dietary management only.  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 Currently has no respiratory distress. Discussed again during this visit regarding getting high-resolution CT chest to assess. Gina Bailey has thought about it since the last visit  and states that she may consider if she develops symptoms but for now 'does not want to know.'   - Defer CT chest indefinitely unless patient requests  CKD (chronic kidney disease) stage 3, GFR 30-59 ml/min (HCC) Pt requires refills on medications with associated diagnosis above.  Reviewed disease process and find this medication to be necessary, will not change dose or alter current  therapy.   Patient discussed with Dr. Philipp Ovens   -Gilberto Better, PGY2 [facility] Pager: [phone]

## 2019-12-30 ENCOUNTER — Encounter: Payer: Self-pay | Admitting: Internal Medicine

## 2019-12-30 NOTE — Assessment & Plan Note (Signed)
BP Readings from Last 3 Encounters:  12/26/19 (!) 148/86  09/19/19 128/76  06/13/19 132/88   Gina Bailey is a 74 yo F w/ PMH of HTN, T2DM, HLD and CKD3 who presents to [facility] for management of her chronic conditions. She mentions that she has not been able to pick up her blood pressure medications as she have mistakenly believed it was sent to the wrong pharmacy. She requests that her meds be sent to [facility] [place]. She denies any chest pain, blurry vision, dyspnea, edema.  A/P BP uncontrolled. Exacerbated by medication non-adherence. Refills sent to correct pharmacy. - C/w amlodipine-valsartan-hctz 10-160-25mg  daily - Check bmp next visit

## 2019-12-30 NOTE — Assessment & Plan Note (Signed)
Lab Results  Component Value Date   HGBA1C 6.9 (A) 12/26/2019   Continues to manage w/ diet only. Slightly better hgb a1c at 6.9. Considering advanced age, meeting goal. Continues to wish to focus on dietary management only.  - C/w diet and exercise

## 2019-12-30 NOTE — Assessment & Plan Note (Signed)
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 Currently has no respiratory distress. Discussed again during this visit regarding getting high-resolution CT chest to assess. Gina Bailey has thought about it since the last visit and states that she may consider if she develops symptoms but for now 'does not want to know.'   - Defer CT chest indefinitely unless patient requests

## 2019-12-30 NOTE — Assessment & Plan Note (Signed)
Pt requires refills on medications with associated diagnosis above.  Reviewed disease process and find this medication to be necessary, will not change dose or alter current therapy. 

## 2020-01-01 NOTE — Progress Notes (Signed)
Internal Medicine Clinic Attending ° °Case discussed with Dr. Lee at the time of the visit.  We reviewed the resident’s history and exam and pertinent patient test results.  I agree with the assessment, diagnosis, and plan of care documented in the resident’s note.  °

## 2020-03-27 ENCOUNTER — Other Ambulatory Visit: Payer: Self-pay | Admitting: *Deleted

## 2020-03-27 DIAGNOSIS — E119 Type 2 diabetes mellitus without complications: Secondary | ICD-10-CM

## 2020-03-27 MED ORDER — ACCU-CHEK FASTCLIX LANCETS MISC: 0 refills | Status: DC

## 2020-04-17 ENCOUNTER — Telehealth: Payer: Self-pay | Admitting: Internal Medicine

## 2020-04-17 DIAGNOSIS — I10 Essential (primary) hypertension: Secondary | ICD-10-CM

## 2020-04-17 NOTE — Telephone Encounter (Signed)
RTC, patient states she had an office visit with Dr. Truman Hayward on 12/26/19 and she went to pick up all of her RX's, but the amlodipine-Valartan-HCTZ combo pill was not at the pharmacy [facility]).  TC to [facility], spoke with pharmacist, Nunzio Cory.  Informed her RX was sent on 12/26/19 w/ confirmation receipt via [place].  Pharmacist states she did not receive this RX.  RN gave a VO off of written order for Amlodipine-Valsartan-HCTZ 10-160-25mg  tabs; Take 1 tablet daily #90 w/ 2 RF. Pharmacist states they will call patient when it is ready. SChaplin, RN,BSN

## 2020-04-17 NOTE — Telephone Encounter (Signed)
Thank you Stacee. Please let me know if the order does not come through.

## 2020-04-17 NOTE — Telephone Encounter (Signed)
Pls contact regarding medicine problems [phone]

## 2020-04-18 DIAGNOSIS — H43812 Vitreous degeneration, left eye: Secondary | ICD-10-CM | POA: Diagnosis not present

## 2020-04-18 DIAGNOSIS — H25813 Combined forms of age-related cataract, bilateral: Secondary | ICD-10-CM | POA: Diagnosis not present

## 2020-04-18 DIAGNOSIS — E119 Type 2 diabetes mellitus without complications: Secondary | ICD-10-CM | POA: Diagnosis not present

## 2020-04-18 DIAGNOSIS — H04123 Dry eye syndrome of bilateral lacrimal glands: Secondary | ICD-10-CM | POA: Diagnosis not present

## 2020-04-18 LAB — HM DIABETES EYE EXAM

## 2020-04-19 ENCOUNTER — Encounter: Payer: Self-pay | Admitting: *Deleted

## 2020-04-25 MED ORDER — AMLODIPINE BESYLATE 10 MG PO TABS: 10.0000 mg | ORAL_TABLET | Freq: Every day | ORAL | 3 refills | Status: DC

## 2020-04-25 MED ORDER — LOSARTAN POTASSIUM 50 MG PO TABS: 50.0000 mg | ORAL_TABLET | Freq: Every day | ORAL | 3 refills | Status: DC

## 2020-04-25 MED ORDER — HYDROCHLOROTHIAZIDE 25 MG PO TABS: 25.0000 mg | ORAL_TABLET | Freq: Every day | ORAL | 3 refills | Status: DC

## 2020-04-25 NOTE — Telephone Encounter (Signed)
Received a fax from [facility], dated today, states the amlod/Val/Hctz 10/160/25mg  tablets are not available.  TC to [facility], placed on hold with message that wait time will be long.  TC to patient, she states the pharmacy has tried to get this combo pill for her, but just notified her that they cannot get it from the manufacturer.  She is requesting MD break up the RX into separate pills again.   Dr. Truman Hayward,  Will you please note on the new RX's to d/c the combo pill as I am unable to get through to [facility] to discontinue this for you. Thanks! SChaplin, RN,BSN

## 2020-04-25 NOTE — Telephone Encounter (Signed)
Sounds good. I will re-send the bp med in separate formulations. Thank you.  Spoke with [name] and informed her of sending separate medications for her bp regimen in lieu of d/cing the combination pill. [name] expressed understanding.

## 2020-05-08 ENCOUNTER — Other Ambulatory Visit: Payer: Self-pay

## 2020-05-08 ENCOUNTER — Encounter: Payer: Self-pay | Admitting: Internal Medicine

## 2020-05-08 ENCOUNTER — Ambulatory Visit (INDEPENDENT_AMBULATORY_CARE_PROVIDER_SITE_OTHER): Payer: Medicare PPO | Admitting: Internal Medicine

## 2020-05-08 VITALS — BP 150/86 | HR 94 | Temp 98.2°F | Ht 65.0 in | Wt 167.8 lb

## 2020-05-08 DIAGNOSIS — I10 Essential (primary) hypertension: Secondary | ICD-10-CM | POA: Diagnosis not present

## 2020-05-08 DIAGNOSIS — E1122 Type 2 diabetes mellitus with diabetic chronic kidney disease: Secondary | ICD-10-CM | POA: Diagnosis not present

## 2020-05-08 DIAGNOSIS — E119 Type 2 diabetes mellitus without complications: Secondary | ICD-10-CM | POA: Diagnosis not present

## 2020-05-08 DIAGNOSIS — I129 Hypertensive chronic kidney disease with stage 1 through stage 4 chronic kidney disease, or unspecified chronic kidney disease: Secondary | ICD-10-CM

## 2020-05-08 DIAGNOSIS — N1832 Chronic kidney disease, stage 3b: Secondary | ICD-10-CM | POA: Diagnosis not present

## 2020-05-08 LAB — POCT GLYCOSYLATED HEMOGLOBIN (HGB A1C): Hemoglobin A1C: 6.3 % — AB (ref 4.0–5.6)

## 2020-05-08 LAB — GLUCOSE, CAPILLARY: Glucose-Capillary: 136 mg/dL — ABNORMAL HIGH (ref 70–99)

## 2020-05-08 NOTE — Patient Instructions (Signed)
Dear [name],  Thank you for allowing [place] to provide your care today. Today we discussed your blood pressure    I have ordered bmp labs for you. I will call if any are abnormal.    Today we made the following changes to your medications:    Based on your bmp, I will add on spironolactone  Please follow-up in 4 weeks.    Should you have any questions or concerns please call the internal medicine clinic at [phone].    Thank you for choosing [facility].   DASH Eating Plan DASH stands for "Dietary Approaches to Stop Hypertension." The DASH eating plan is a healthy eating plan that has been shown to reduce high blood pressure (hypertension). It may also reduce your risk for type 2 diabetes, heart disease, and stroke. The DASH eating plan may also help with weight loss. What are tips for following this plan?  General guidelines  Avoid eating more than 2,300 mg (milligrams) of salt (sodium) a day. If you have hypertension, you may need to reduce your sodium intake to 1,500 mg a day.  Limit alcohol intake to no more than 1 drink a day for nonpregnant women and 2 drinks a day for men. One drink equals 12 oz of beer, 5 oz of wine, or 1 oz of hard liquor.  Work with your health care provider to maintain a healthy body weight or to lose weight. Ask what an ideal weight is for you.  Get at least 30 minutes of exercise that causes your heart to beat faster (aerobic exercise) most days of the week. Activities may include walking, swimming, or biking.  Work with your health care provider or diet and nutrition specialist (dietitian) to adjust your eating plan to your individual calorie needs. Reading food labels   Check food labels for the amount of sodium per serving. Choose foods with less than 5 percent of the Daily Value of sodium. Generally, foods with less than 300 mg of sodium per serving fit into this eating plan.  To find whole grains, look for the word "whole" as the  first word in the ingredient list. Shopping  Buy products labeled as "low-sodium" or "no salt added."  Buy fresh foods. Avoid canned foods and premade or frozen meals. Cooking  Avoid adding salt when cooking. Use salt-free seasonings or herbs instead of table salt or sea salt. Check with your health care provider or pharmacist before using salt substitutes.  Do not fry foods. Cook foods using healthy methods such as baking, boiling, grilling, and broiling instead.  Cook with heart-healthy oils, such as olive, canola, soybean, or sunflower oil. Meal planning  Eat a balanced diet that includes: ? 5 or more servings of fruits and vegetables each day. At each meal, try to fill half of your plate with fruits and vegetables. ? Up to 6-8 servings of whole grains each day. ? Less than 6 oz of lean meat, poultry, or fish each day. A 3-oz serving of meat is about the same size as a deck of cards. One egg equals 1 oz. ? 2 servings of low-fat dairy each day. ? A serving of nuts, seeds, or beans 5 times each week. ? Heart-healthy fats. Healthy fats called Omega-3 fatty acids are found in foods such as flaxseeds and coldwater fish, like sardines, salmon, and mackerel.  Limit how much you eat of the following: ? Canned or prepackaged foods. ? Food that is high in trans fat, such as  fried foods. ? Food that is high in saturated fat, such as fatty meat. ? Sweets, desserts, sugary drinks, and other foods with added sugar. ? Full-fat dairy products.  Do not salt foods before eating.  Try to eat at least 2 vegetarian meals each week.  Eat more home-cooked food and less restaurant, buffet, and fast food.  When eating at a restaurant, ask that your food be prepared with less salt or no salt, if possible. What foods are recommended? The items listed may not be a complete list. Talk with your dietitian about what dietary choices are best for you. Grains Whole-grain or whole-wheat bread. Whole-grain  or whole-wheat pasta. Brown rice. [place]. Bulgur. Whole-grain and low-sodium cereals. Pita bread. Low-fat, low-sodium crackers. Whole-wheat flour tortillas. Vegetables Fresh or frozen vegetables (raw, steamed, roasted, or grilled). Low-sodium or reduced-sodium tomato and vegetable juice. Low-sodium or reduced-sodium tomato sauce and tomato paste. Low-sodium or reduced-sodium canned vegetables. Fruits All fresh, dried, or frozen fruit. Canned fruit in natural juice (without added sugar). Meat and other protein foods Skinless chicken or [place]. Ground chicken or [place]. Pork with fat trimmed off. Fish and seafood. Egg whites. Dried beans, peas, or lentils. Unsalted nuts, nut butters, and seeds. Unsalted canned beans. Lean cuts of beef with fat trimmed off. Low-sodium, lean deli meat. Dairy Low-fat (1%) or fat-free (skim) milk. Fat-free, low-fat, or reduced-fat cheeses. Nonfat, low-sodium ricotta or cottage cheese. Low-fat or nonfat yogurt. Low-fat, low-sodium cheese. Fats and oils Soft margarine without trans fats. Vegetable oil. Low-fat, reduced-fat, or light mayonnaise and salad dressings (reduced-sodium). Canola, safflower, olive, soybean, and sunflower oils. Avocado. Seasoning and other foods Herbs. Spices. Seasoning mixes without salt. Unsalted popcorn and pretzels. Fat-free sweets. What foods are not recommended? The items listed may not be a complete list. Talk with your dietitian about what dietary choices are best for you. Grains Baked goods made with fat, such as croissants, muffins, or some breads. Dry pasta or rice meal packs. Vegetables Creamed or fried vegetables. Vegetables in a cheese sauce. Regular canned vegetables (not low-sodium or reduced-sodium). Regular canned tomato sauce and paste (not low-sodium or reduced-sodium). Regular tomato and vegetable juice (not low-sodium or reduced-sodium). Angie Fava. Olives. Fruits Canned fruit in a light or heavy syrup. Fried fruit.  Fruit in cream or butter sauce. Meat and other protein foods Fatty cuts of meat. Ribs. Fried meat. Berniece Salines. Sausage. Bologna and other processed lunch meats. Salami. Fatback. Hotdogs. Bratwurst. Salted nuts and seeds. Canned beans with added salt. Canned or smoked fish. Whole eggs or egg yolks. Chicken or [place] with skin. Dairy Whole or 2% milk, cream, and half-and-half. Whole or full-fat cream cheese. Whole-fat or sweetened yogurt. Full-fat cheese. Nondairy creamers. Whipped toppings. Processed cheese and cheese spreads. Fats and oils Butter. Stick margarine. Lard. Shortening. Ghee. Bacon fat. Tropical oils, such as coconut, palm kernel, or palm oil. Seasoning and other foods Salted popcorn and pretzels. Onion salt, garlic salt, seasoned salt, table salt, and sea salt. Worcestershire sauce. Tartar sauce. Barbecue sauce. Teriyaki sauce. Soy sauce, including reduced-sodium. Steak sauce. Canned and packaged gravies. Fish sauce. Oyster sauce. Cocktail sauce. Horseradish that you find on the shelf. Ketchup. Mustard. Meat flavorings and tenderizers. Bouillon cubes. Hot sauce and Tabasco sauce. Premade or packaged marinades. Premade or packaged taco seasonings. Relishes. Regular salad dressings. Where to find more information:  National Heart, Lung, and [place]: https://wilson-eaton.com/  American Heart Association: www.heart.org Summary  The DASH eating plan is a healthy eating plan that has been shown to  reduce high blood pressure (hypertension). It may also reduce your risk for type 2 diabetes, heart disease, and stroke.  With the DASH eating plan, you should limit salt (sodium) intake to 2,300 mg a day. If you have hypertension, you may need to reduce your sodium intake to 1,500 mg a day.  When on the DASH eating plan, aim to eat more fresh fruits and vegetables, whole grains, lean proteins, low-fat dairy, and heart-healthy fats.  Work with your health care provider or diet and nutrition  specialist (dietitian) to adjust your eating plan to your individual calorie needs. This information is not intended to replace advice given to you by your health care provider. Make sure you discuss any questions you have with your health care provider. Document Revised: 06/18/2017 Document Reviewed: 06/29/2016 Elsevier Patient Education  2020 [employer].

## 2020-05-09 ENCOUNTER — Encounter: Payer: Self-pay | Admitting: Internal Medicine

## 2020-05-09 LAB — BMP8+ANION GAP
Anion Gap: 17 mmol/L (ref 10.0–18.0)
BUN/Creatinine Ratio: 12 (ref 12–28)
BUN: 22 mg/dL (ref 8–27)
CO2: 28 mmol/L (ref 20–29)
Calcium: 10.2 mg/dL (ref 8.7–10.3)
Chloride: 98 mmol/L (ref 96–106)
Creatinine, Ser: 1.77 mg/dL — ABNORMAL HIGH (ref 0.57–1.00)
GFR calc Af Amer: 32 mL/min/{1.73_m2} — ABNORMAL LOW (ref >59)
GFR calc non Af Amer: 28 mL/min/{1.73_m2} — ABNORMAL LOW (ref >59)
Glucose: 142 mg/dL — ABNORMAL HIGH (ref 65–99)
Potassium: 3.3 mmol/L — ABNORMAL LOW (ref 3.5–5.2)
Sodium: 143 mmol/L (ref 134–144)

## 2020-05-09 LAB — MICROALBUMIN / CREATININE URINE RATIO
Creatinine, Urine: 67.1 mg/dL
Microalb/Creat Ratio: 2604 mg/g creat — ABNORMAL HIGH (ref 0–29)
Microalbumin, Urine: 1747.6 ug/mL

## 2020-05-09 MED ORDER — EMPAGLIFLOZIN 10 MG PO TABS: 10.0000 mg | ORAL_TABLET | Freq: Every day | ORAL | 3 refills | Status: DC

## 2020-05-09 NOTE — Assessment & Plan Note (Signed)
Lab Results  Component Value Date   HGBA1C 6.3 (A) 05/08/2020   Well controlled on diet alone.  - C/w diet and exercise

## 2020-05-09 NOTE — Assessment & Plan Note (Addendum)
Lab Results  Component Value Date   CREATININE 1.77 (H) 05/08/2020   CREATININE 1.51 (H) 06/13/2019   CREATININE 1.36 (H) 03/14/2019   Present w/ hx of CKD3b with baseline GFR of 39. Previously discussed with patient regarding starting SGLT-2 inhibitor which patient had refused at that time due to hesitancy in medication changes and wished to monitor before. Shown to have proteinuria on prior screen. Will re-check this visit. - Repeat bmp - Microalbumin/urine ratio - Can start SGLT-2 inhibitor for renal protection if GFR stable  Addendum: Noted to have worsening proteinuria and renal function. Will advise to start empagliflozin 10mg . Also will do further work-up proteinuria as it is in nephrotic range. Also will refer to nephrology at this time for dialysis planning. Patient called and notified of these changes. - Referral to nephrology - lipid / hepatic panel - Start empagliflozin 10mg 

## 2020-05-09 NOTE — Assessment & Plan Note (Signed)
BP Readings from Last 3 Encounters:  05/08/20 (!) 150/86  12/26/19 (!) 148/86  09/19/19 128/76   Gina Bailey is a 74 yo F w/ PMH of HTn, T2DM, HLD and CKD3 who presents to [facility] for management of her chronic conditions. She mentions that she was able to get her blood pressure medications as separate pills as her pharmacy did not have supply of the combination pills and she has been taking her home medications as prescribed. However, when reviewing her home pill box, she was noted to be missing hydrochlorothiazide. Advised to take ALL of her bp meds as prescribed.  A/P Bp above goal. Prescribed amlodipine, hctz, losartan but intermittent missed doses due to health illiteracy. Advised to take meds as prescribed. - C/w amlodipine 10mg  daily, losartan 50mg  daily, hctz 25mg  daily - Check bmp - May benefit from adding on aldosterone antagonist if renal fx and potassium appear stable

## 2020-05-09 NOTE — Progress Notes (Signed)
CC: Blood pressure  HPI: [name] is a 74 y.o. with PMH listed below presenting with complaint of blood pressure. Please see problem based assessment and plan for further details.  Past Medical History:  Diagnosis Date  . Diabetes mellitus    Diet-controlled. Not on antiglycemic medications.  . 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 HLD (hyperlipidemia)   . Hypertension   . Hypokalemia    recurrent  . Left shoulder pain 11/12/2015  . Seasonal allergies   . Skin complaints 09/15/2017  . Ventral hernia    Incisional ventral hernia. S/P repair by Dr. Hulen Skains  (08/2008)  . Ventricular hypertrophy 05/2006   LVH on ECG (05/2006) - Likely 2/2 HTN.  . 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 Hulen Skains. (08/2006)    Review of Systems: Review of Systems  Constitutional: Negative for chills, fever and malaise/fatigue.  Eyes: Negative for blurred vision.  Respiratory: Negative for cough and shortness of breath.   Cardiovascular: Positive for leg swelling. Negative for chest pain and palpitations.  Gastrointestinal: Negative for constipation, diarrhea, nausea and vomiting.  Musculoskeletal: Negative for back pain and joint pain.  Neurological: Negative for dizziness.  All other systems reviewed and are negative.    Physical Exam: Vitals:   05/08/20 1100 05/08/20 1122  BP: (!) 161/86 (!) 150/86  Pulse: 94   Temp: 98.2 F (36.8 C)   TempSrc: Oral   SpO2: 100%   Weight: 167 lb 12.8 oz (76.1 kg)   Height: 5\' 5"  (1.651 m)    Gen: Well-developed, well nourished, NAD HEENT: NCAT head, hearing intact CV: RRR, S1, S2 normal Pulm: CTAB, No rales, no wheezes Extm: ROM intact, Peripheral pulses intact, trace ankle edema Skin: Dry, Warm, normal turgor  Assessment & Plan:   Hypertension BP Readings from Last 3 Encounters:  05/08/20 (!) 150/86  12/26/19 (!) 148/86  09/19/19 128/76   [name] is a 74 yo F w/ PMH of HTn, T2DM, HLD  and CKD3 who presents to [facility] for management of her chronic conditions. She mentions that she was able to get her blood pressure medications as separate pills as her pharmacy did not have supply of the combination pills and she has been taking her home medications as prescribed. However, when reviewing her home pill box, she was noted to be missing hydrochlorothiazide. Advised to take ALL of her bp meds as prescribed.  A/P Bp above goal. Prescribed amlodipine, hctz, losartan but intermittent missed doses due to health illiteracy. Advised to take meds as prescribed. - C/w amlodipine 10mg  daily, losartan 50mg  daily, hctz 25mg  daily - Check bmp - May benefit from adding on aldosterone antagonist if renal fx and potassium appear stable  Type 2 diabetes mellitus without complication, without long-term current use of insulin ([place]) Lab Results  Component Value Date   HGBA1C 6.3 (A) 05/08/2020   Well controlled on diet alone.  - C/w diet and exercise  CKD (chronic kidney disease) stage 3, GFR 30-59 ml/min (HCC) Lab Results  Component Value Date   CREATININE 1.77 (H) 05/08/2020   CREATININE 1.51 (H) 06/13/2019   CREATININE 1.36 (H) 03/14/2019   Present w/ hx of CKD3b with baseline GFR of 39. Previously discussed with patient regarding starting SGLT-2 inhibitor which patient had refused at that time due to hesitancy in medication changes and wished to monitor before. Shown to have proteinuria on prior screen. Will re-check this visit. - Repeat bmp - Microalbumin/urine  ratio - Can start SGLT-2 inhibitor for renal protection if GFR stable  Addendum: Noted to have worsening proteinuria and renal function. Will advise to start empagliflozin 10mg . Also will do further work-up proteinuria as it is in nephrotic range. Also will refer to nephrology at this time for dialysis planning. Patient called and notified of these changes. - Referral to nephrology - lipid / hepatic panel - Start empagliflozin  10mg     Patient discussed with Dr. Evette Doffing  -Gilberto Better, [facility] Pager: [phone]

## 2020-05-10 LAB — LIPID PANEL
Chol/HDL Ratio: 2.2 ratio (ref 0.0–4.4)
Cholesterol, Total: 222 mg/dL — ABNORMAL HIGH (ref 100–199)
HDL: 103 mg/dL (ref >39)
LDL Chol Calc (NIH): 108 mg/dL — ABNORMAL HIGH (ref 0–99)
Triglycerides: 63 mg/dL (ref 0–149)
VLDL Cholesterol Cal: 11 mg/dL (ref 5–40)

## 2020-05-10 LAB — HEPATIC FUNCTION PANEL
ALT: 12 [IU]/L (ref 0–32)
AST: 22 [IU]/L (ref 0–40)
Albumin: 4.5 g/dL (ref 3.7–4.7)
Alkaline Phosphatase: 76 [IU]/L (ref 44–121)
Bilirubin Total: 0.4 mg/dL (ref 0.0–1.2)
Bilirubin, Direct: <0.1 mg/dL (ref 0.00–0.40)
Total Protein: 7.8 g/dL (ref 6.0–8.5)

## 2020-05-10 LAB — SPECIMEN STATUS REPORT

## 2020-05-10 NOTE — Progress Notes (Signed)
Internal Medicine Clinic Attending  Case discussed with Dr. Lee  At the time of the visit.  We reviewed the resident's history and exam and pertinent patient test results.  I agree with the assessment, diagnosis, and plan of care documented in the resident's note.    

## 2020-05-30 ENCOUNTER — Encounter: Payer: Medicare PPO | Admitting: Internal Medicine

## 2020-05-30 DIAGNOSIS — Q6102 Congenital multiple renal cysts: Secondary | ICD-10-CM | POA: Diagnosis not present

## 2020-05-30 DIAGNOSIS — E1122 Type 2 diabetes mellitus with diabetic chronic kidney disease: Secondary | ICD-10-CM | POA: Diagnosis not present

## 2020-05-30 DIAGNOSIS — E1129 Type 2 diabetes mellitus with other diabetic kidney complication: Secondary | ICD-10-CM | POA: Diagnosis not present

## 2020-05-30 DIAGNOSIS — N183 Chronic kidney disease, stage 3 unspecified: Secondary | ICD-10-CM | POA: Diagnosis not present

## 2020-05-30 DIAGNOSIS — R3915 Urgency of urination: Secondary | ICD-10-CM | POA: Diagnosis not present

## 2020-05-30 DIAGNOSIS — R809 Proteinuria, unspecified: Secondary | ICD-10-CM | POA: Diagnosis not present

## 2020-05-30 DIAGNOSIS — I129 Hypertensive chronic kidney disease with stage 1 through stage 4 chronic kidney disease, or unspecified chronic kidney disease: Secondary | ICD-10-CM | POA: Diagnosis not present

## 2020-05-30 DIAGNOSIS — N1832 Chronic kidney disease, stage 3b: Secondary | ICD-10-CM | POA: Diagnosis not present

## 2020-06-03 ENCOUNTER — Other Ambulatory Visit: Payer: Self-pay

## 2020-06-03 ENCOUNTER — Ambulatory Visit: Payer: Medicare PPO | Admitting: Internal Medicine

## 2020-06-03 ENCOUNTER — Encounter: Payer: Self-pay | Admitting: Internal Medicine

## 2020-06-03 VITALS — BP 154/97 | HR 84 | Temp 98.1°F | Ht 65.0 in | Wt 168.3 lb

## 2020-06-03 DIAGNOSIS — E1169 Type 2 diabetes mellitus with other specified complication: Secondary | ICD-10-CM | POA: Diagnosis not present

## 2020-06-03 DIAGNOSIS — E785 Hyperlipidemia, unspecified: Secondary | ICD-10-CM | POA: Diagnosis not present

## 2020-06-03 DIAGNOSIS — I10 Essential (primary) hypertension: Secondary | ICD-10-CM | POA: Diagnosis not present

## 2020-06-03 MED ORDER — ROSUVASTATIN CALCIUM 20 MG PO TABS: 20.0000 mg | ORAL_TABLET | Freq: Every day | ORAL | 1 refills | Status: DC

## 2020-06-03 NOTE — Assessment & Plan Note (Addendum)
Lab Results  Component Value Date   CHOL 222 (H) 05/08/2020   HDL 103 05/08/2020   LDLCALC 108 (H) 05/08/2020   TRIG 63 05/08/2020   CHOLHDL 2.2 05/08/2020   Assessment/Plan: ASCVD risk score of 54.5% in the next 10 years that decreases to 40.8% with intensification of statin therapy.  I discussed this with Gina Bailey today and she is in agreement to switch simvastatin to high intensity statin.  -Discontinue simvastatin -Start rosuvastatin 20 mg daily

## 2020-06-03 NOTE — Assessment & Plan Note (Addendum)
BP Readings from Last 3 Encounters:  06/03/20 (!) 154/97  05/08/20 (!) 150/86  12/26/19 (!) 148/86   Gina Bailey states that she has been taking her losartan and amlodipine daily without any difficulty.  She recently followed up with her new nephrologist Dr. Royce Macadamia, who discontinued her HCTZ and aspirin.  She denies any symptoms at this time including dizziness, leg swelling, shortness of breath, chest pain.  Assessment/plan: Blood pressure continues to be elevated today, however no medication changes were made as patient is starting a work-up for her proteinuria.  Given that her HCTZ was discontinued by nephrology, I would like nephrology's input prior to starting any additional medication, especially aldosterone antagonist as we have considered in the past.  I requested that Gina Bailey follow-up with Dr. Royce Macadamia and we will have our records sent to them as well.  If needed, would recommend starting with a beta-blocker  -Continue amlodipine and losartan -78-month follow-up

## 2020-06-03 NOTE — Patient Instructions (Addendum)
It was nice seeing you today! Thank you for choosing [facility] for your Primary Care.    Today we talked about:   1. High Blood Pressure: Please contact your kidney doctor to let them know about your high blood pressure today. We will also send over our records.   2. High Cholesterol: We will switch your medications today. Please stop taking Simvastatin. Start a new medication called Rosuvastatin. Take once daily in the afternoon or with supper.

## 2020-06-03 NOTE — Progress Notes (Signed)
   CC: HTN  HPI:  [name] is a 74 y.o. with a PMHx as listed below who presents to the clinic for HTN.   Please see the Encounters tab for problem-based Assessment & Plan regarding status of patient's acute and chronic conditions.  Past Medical History:  Diagnosis Date  . Diabetes mellitus    Diet-controlled. Not on antiglycemic medications.  . 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 HLD (hyperlipidemia)   . Hypertension   . Hypokalemia    recurrent  . Left shoulder pain 11/12/2015  . Seasonal allergies   . Skin complaints 09/15/2017  . Ventral hernia    Incisional ventral hernia. S/P repair by Dr. Hulen Skains  (08/2008)  . Ventricular hypertrophy 05/2006   LVH on ECG (05/2006) - Likely 2/2 HTN.  . 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 Hulen Skains. (08/2006)   Review of Systems: Review of Systems  Constitutional: Negative for chills, fever and weight loss.  Respiratory: Negative for shortness of breath.   Cardiovascular: Negative for chest pain, palpitations and leg swelling.  Gastrointestinal: Negative for abdominal pain, diarrhea, nausea and vomiting.  Genitourinary: Negative for dysuria, frequency, hematuria and urgency.  Neurological: Negative for dizziness and headaches.   Physical Exam:  Vitals:   06/03/20 0932 06/03/20 0934 06/03/20 1010  BP: (!) 168/94 (!) 169/91 (!) 154/97  Pulse: 100 95 84  Temp: 98.1 F (36.7 C)    TempSrc: Oral    SpO2: 98%    Weight: 168 lb 4.8 oz (76.3 kg)    Height: 5\' 5"  (1.651 m)      Physical Exam Vitals and nursing note reviewed.  Constitutional:      General: She is not in acute distress.    Appearance: She is normal weight.  HENT:     Head: Normocephalic and atraumatic.  Pulmonary:     Effort: Pulmonary effort is normal. No respiratory distress.  Skin:    General: Skin is warm and dry.  Neurological:     General: No focal deficit present.     Mental Status: She is alert and  oriented to person, place, and time. Mental status is at baseline.  Psychiatric:        Mood and Affect: Mood normal.        Behavior: Behavior normal.     Assessment & Plan:   See Encounters Tab for problem based charting.  Patient discussed with Dr. Rebeca Alert

## 2020-06-04 ENCOUNTER — Other Ambulatory Visit: Payer: Self-pay | Admitting: Nephrology

## 2020-06-04 DIAGNOSIS — Q6102 Congenital multiple renal cysts: Secondary | ICD-10-CM

## 2020-06-04 DIAGNOSIS — N1832 Chronic kidney disease, stage 3b: Secondary | ICD-10-CM

## 2020-06-17 DIAGNOSIS — N1832 Chronic kidney disease, stage 3b: Secondary | ICD-10-CM | POA: Diagnosis not present

## 2020-06-17 DIAGNOSIS — R809 Proteinuria, unspecified: Secondary | ICD-10-CM | POA: Diagnosis not present

## 2020-06-17 DIAGNOSIS — E1129 Type 2 diabetes mellitus with other diabetic kidney complication: Secondary | ICD-10-CM | POA: Diagnosis not present

## 2020-06-17 NOTE — Progress Notes (Signed)
Internal Medicine Clinic Attending  Case discussed with Dr. Basaraba at the time of the visit.  We reviewed the resident's history and exam and pertinent patient test results.  I agree with the assessment, diagnosis, and plan of care documented in the resident's note.  Traxton Kolenda, M.D., Ph.D.  

## 2020-06-20 ENCOUNTER — Ambulatory Visit
Admission: RE | Admit: 2020-06-20 | Discharge: 2020-06-20 | Disposition: A | Discharge status: Home / Self Care | Payer: Medicare PPO | Source: Ambulatory Visit | Attending: Nephrology | Admitting: Nephrology

## 2020-06-20 DIAGNOSIS — N1832 Chronic kidney disease, stage 3b: Secondary | ICD-10-CM

## 2020-06-20 DIAGNOSIS — N281 Cyst of kidney, acquired: Secondary | ICD-10-CM | POA: Diagnosis not present

## 2020-06-20 DIAGNOSIS — Q6102 Congenital multiple renal cysts: Secondary | ICD-10-CM

## 2020-06-24 ENCOUNTER — Other Ambulatory Visit: Payer: Self-pay | Admitting: Student

## 2020-06-24 DIAGNOSIS — E119 Type 2 diabetes mellitus without complications: Secondary | ICD-10-CM

## 2020-07-31 DIAGNOSIS — N281 Cyst of kidney, acquired: Secondary | ICD-10-CM | POA: Diagnosis not present

## 2020-07-31 DIAGNOSIS — R3915 Urgency of urination: Secondary | ICD-10-CM | POA: Diagnosis not present

## 2020-08-06 ENCOUNTER — Encounter: Payer: Medicare PPO | Admitting: Student

## 2020-08-12 ENCOUNTER — Other Ambulatory Visit (HOSPITAL_COMMUNITY): Payer: Self-pay | Admitting: Urology

## 2020-08-12 ENCOUNTER — Other Ambulatory Visit: Payer: Self-pay | Admitting: Urology

## 2020-08-12 DIAGNOSIS — N281 Cyst of kidney, acquired: Secondary | ICD-10-CM

## 2020-08-19 ENCOUNTER — Encounter: Payer: Self-pay | Admitting: Student

## 2020-08-19 ENCOUNTER — Other Ambulatory Visit: Payer: Self-pay

## 2020-08-19 ENCOUNTER — Ambulatory Visit: Payer: Medicare PPO | Admitting: Student

## 2020-08-19 VITALS — BP 152/89 | HR 105 | Temp 98.2°F | Ht 65.0 in | Wt 179.6 lb

## 2020-08-19 DIAGNOSIS — I10 Essential (primary) hypertension: Secondary | ICD-10-CM | POA: Diagnosis not present

## 2020-08-19 DIAGNOSIS — E870 Hyperosmolality and hypernatremia: Secondary | ICD-10-CM

## 2020-08-19 DIAGNOSIS — E785 Hyperlipidemia, unspecified: Secondary | ICD-10-CM | POA: Diagnosis not present

## 2020-08-19 DIAGNOSIS — E119 Type 2 diabetes mellitus without complications: Secondary | ICD-10-CM

## 2020-08-19 DIAGNOSIS — E1169 Type 2 diabetes mellitus with other specified complication: Secondary | ICD-10-CM | POA: Diagnosis not present

## 2020-08-19 DIAGNOSIS — N183 Chronic kidney disease, stage 3 unspecified: Secondary | ICD-10-CM

## 2020-08-19 DIAGNOSIS — N1832 Chronic kidney disease, stage 3b: Secondary | ICD-10-CM | POA: Diagnosis not present

## 2020-08-19 DIAGNOSIS — R011 Cardiac murmur, unspecified: Secondary | ICD-10-CM

## 2020-08-19 MED ORDER — CALCIUM CARBONATE-VITAMIN D 600-400 MG-UNIT PO TABS: 1.0000 | ORAL_TABLET | Freq: Two times a day (BID) | ORAL | 5 refills | Status: DC

## 2020-08-19 MED ORDER — CARVEDILOL 3.125 MG PO TABS: 3.1250 mg | ORAL_TABLET | Freq: Two times a day (BID) | ORAL | 3 refills | Status: DC

## 2020-08-19 NOTE — Progress Notes (Signed)
   CC: Hypertension  HPI:  [name] is a 75 y.o. lady w/ PMHx NIDDM type II, HTN, HLD, CKD stage 3b, presenting for follow up of HTN without any acute complaints. Please see problem-based assessment / plan for full details.   Past Medical History:  Diagnosis Date  . Diabetes mellitus    Diet-controlled. Not on antiglycemic medications.  . 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 HLD (hyperlipidemia)   . Hypertension   . Hypokalemia    recurrent  . Left shoulder pain 11/12/2015  . Seasonal allergies   . Skin complaints 09/15/2017  . Ventral hernia    Incisional ventral hernia. S/P repair by Dr. Hulen Skains  (08/2008)  . Ventricular hypertrophy 05/2006   LVH on ECG (05/2006) - Likely 2/2 HTN.  . 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 Hulen Skains. (08/2006)   Review of Systems:  All others negative except as noted in assessment / plan.   Physical Exam:  Vitals:   08/19/20 0908 08/19/20 0911  BP: (!) 163/89 (!) 152/89  Pulse: (!) 113 (!) 105  Temp: 98.2 F (36.8 C)   TempSrc: Oral   SpO2: 97%   Weight: 179 lb 9.6 oz (81.5 kg)   Height: 5\' 5"  (1.651 m)    General: Patient appears tired but well. No acute distress. Eyes: Sclera non-icteric. No conjunctival injection.  HENT: Poor dentition. Mildly dry mucus membranes. No nasal discharge. Respiratory: Lungs are CTA, bilaterally. No wheezes, rales, or rhonchi.  Cardiovascular: Rate is slightly tachycardic. Rhythm is regular. There is a 3/6 systolic ejection murmur heard best along the right upper chest. Also a 2/6 early systolic blowing murmur heard loudest along the left lower sternal border. There is mild bilateral lower extremity non-pitting edema.  Neurological: Speech is slowed, although patient is alert and oriented x 3.  Musculoskeletal: Normal muscle strength and tone.  Abdominal: Soft and non-tender to palpation. Bowel sounds intact. No rebound or guarding. Skin: There is mild  peeling of the bilateral shins without lesions or rashes.  Psych: Normal affect. Normal tone of voice.   Assessment & Plan:   See Encounters Tab for problem based charting.  Patient discussed with Dr. Daryll Drown.  Jeralyn Bennett, MD 08/20/2020, 1:07 PM Pager: [phone]

## 2020-08-19 NOTE — Patient Instructions (Signed)
[name],  Your blood pressure remains very elevated today and your pulse is quick. I did hear heart murmurs on your examination today.   Please START taking Coreg (carvedilol) 1 tablet in the morning and 1 tablet in the evening every day.  Please continue taking your other medications as prescribed and continue not to take HCTZ and aspirin.   I have referred you for an ultrasound (ECHO) of your heart to assess your murmurs.   We will check your cholesterol, liver and kidney function today and I will call you with results when they are available.   Please follow up in 1 month to reassess your blood pressure and be sure to inform your nephrologist of your visit today.   Thank you,  Dr. Konrad Penta    Echocardiogram An echocardiogram is a test that uses sound waves (ultrasound) to produce images of the heart. Images from an echocardiogram can provide important information about:  Heart size and shape.  The size and thickness and movement of your heart's walls.  Heart muscle function and strength.  Heart valve function or if you have stenosis. Stenosis is when the heart valves are too narrow.  If blood is flowing backward through the heart valves (regurgitation).  A tumor or infectious growth around the heart valves.  Areas of heart muscle that are not working well because of poor blood flow or injury from a heart attack.  Aneurysm detection. An aneurysm is a weak or damaged part of an artery wall. The wall bulges out from the normal force of blood pumping through the body. Tell a health care provider about:  Any allergies you have.  All medicines you are taking, including vitamins, herbs, eye drops, creams, and over-the-counter medicines.  Any blood disorders you have.  Any surgeries you have had.  Any medical conditions you have.  Whether you are pregnant or may be pregnant. What are the risks? Generally, this is a safe test. However, problems may occur, including an  allergic reaction to dye (contrast) that may be used during the test. What happens before the test? No specific preparation is needed. You may eat and drink normally. What happens during the test?  You will take off your clothes from the waist up and put on a hospital gown.  Electrodes or electrocardiogram (ECG)patches may be placed on your chest. The electrodes or patches are then connected to a device that monitors your heart rate and rhythm.  You will lie down on a table for an ultrasound exam. A gel will be applied to your chest to help sound waves pass through your skin.  A handheld device, called a transducer, will be pressed against your chest and moved over your heart. The transducer produces sound waves that travel to your heart and bounce back (or "echo" back) to the transducer. These sound waves will be captured in real-time and changed into images of your heart that can be viewed on a video monitor. The images will be recorded on a computer and reviewed by your health care provider.  You may be asked to change positions or hold your breath for a short time. This makes it easier to get different views or better views of your heart.  In some cases, you may receive contrast through an IV in one of your veins. This can improve the quality of the pictures from your heart. The procedure may vary among health care providers and hospitals.   What can I expect after the test? You  may return to your normal, everyday life, including diet, activities, and medicines, unless your health care provider tells you not to do that. Follow these instructions at home:  It is up to you to get the results of your test. Ask your health care provider, or the department that is doing the test, when your results will be ready.  Keep all follow-up visits. This is important. Summary  An echocardiogram is a test that uses sound waves (ultrasound) to produce images of the heart.  Images from an  echocardiogram can provide important information about the size and shape of your heart, heart muscle function, heart valve function, and other possible heart problems.  You do not need to do anything to prepare before this test. You may eat and drink normally.  After the echocardiogram is completed, you may return to your normal, everyday life, unless your health care provider tells you not to do that. This information is not intended to replace advice given to you by your health care provider. Make sure you discuss any questions you have with your health care provider. Document Revised: 02/27/2020 Document Reviewed: 02/27/2020 Elsevier Patient Education  2021 [employer].

## 2020-08-20 DIAGNOSIS — E870 Hyperosmolality and hypernatremia: Secondary | ICD-10-CM | POA: Insufficient documentation

## 2020-08-20 DIAGNOSIS — R011 Cardiac murmur, unspecified: Secondary | ICD-10-CM | POA: Insufficient documentation

## 2020-08-20 LAB — LIPID PANEL
Chol/HDL Ratio: 1.7 ratio (ref 0.0–4.4)
Cholesterol, Total: 199 mg/dL (ref 100–199)
HDL: 115 mg/dL (ref >39)
LDL Chol Calc (NIH): 72 mg/dL (ref 0–99)
Triglycerides: 67 mg/dL (ref 0–149)
VLDL Cholesterol Cal: 12 mg/dL (ref 5–40)

## 2020-08-20 LAB — CMP14 + ANION GAP
ALT: 21 IU/L (ref 0–32)
AST: 20 IU/L (ref 0–40)
Albumin/Globulin Ratio: 1.3 (ref 1.2–2.2)
Albumin: 4.1 g/dL (ref 3.7–4.7)
Alkaline Phosphatase: 83 IU/L (ref 44–121)
Anion Gap: 17 mmol/L (ref 10.0–18.0)
BUN/Creatinine Ratio: 10 — ABNORMAL LOW (ref 12–28)
BUN: 17 mg/dL (ref 8–27)
Bilirubin Total: 0.3 mg/dL (ref 0.0–1.2)
CO2: 25 mmol/L (ref 20–29)
Calcium: 9.7 mg/dL (ref 8.7–10.3)
Chloride: 104 mmol/L (ref 96–106)
Creatinine, Ser: 1.77 mg/dL — ABNORMAL HIGH (ref 0.57–1.00)
GFR calc Af Amer: 32 mL/min/{1.73_m2} — ABNORMAL LOW (ref >59)
GFR calc non Af Amer: 28 mL/min/{1.73_m2} — ABNORMAL LOW (ref >59)
Globulin, Total: 3.1 g/dL (ref 1.5–4.5)
Glucose: 146 mg/dL — ABNORMAL HIGH (ref 65–99)
Potassium: 4.2 mmol/L (ref 3.5–5.2)
Sodium: 146 mmol/L — ABNORMAL HIGH (ref 134–144)
Total Protein: 7.2 g/dL (ref 6.0–8.5)

## 2020-08-20 NOTE — Assessment & Plan Note (Addendum)
Last hemoglobin A1c was 6.3 in 04/2020, which has remained stable.  - Continue Jardiance 10mg  daily while renal function allows  - Encouraged patient to discuss with her nephrologist - Given stability, check Hb A1c q6 months

## 2020-08-20 NOTE — Assessment & Plan Note (Signed)
Patient presents for follow up of HTN. Blood pressure today continues to be as high as 171/87 with mild tachycardia, although patient is asymptomatic without headache, vision changes, weakness, tingling, or other FND's. Takes daily medications without missing doses. Renin:aldosterone checked in 2014 and was unremarkable, although consider possibility of secondary HTN given recent renal ultrasound with bilateral renal cysts, complicated by possible aortic stenosis.   - Continue amlodipine 10mg  daily  - Continue Losartan 50mg  daily  - Start Carvedilol 3.125mg  twice daily

## 2020-08-20 NOTE — Assessment & Plan Note (Signed)
Patient currently on high-intensity statin with repeat lipid panel today showing cholesterol within target ranges.   - Continue rosuvastatin 20mg  daily

## 2020-08-20 NOTE — Assessment & Plan Note (Signed)
Sodium mildly elevated to 146 today. Likely in setting of decreased fluid intake given mildly dry mucus membranes.   - Encourage hydration - Awaiting ECHO results

## 2020-08-20 NOTE — Assessment & Plan Note (Signed)
Patient has 3/6 right-sided SEM and 2/6 blowing systolic murmur along the lower left sternal border, concerning for AS and TR. Also with mild bilateral non-pitting edema and possible early venous stasis changes of B/L LE's. No ECHO on file.   - Check complete TTE

## 2020-08-20 NOTE — Assessment & Plan Note (Signed)
CMP today shows stable CKD stage 3b with GFR 32. She has established care with [facility]. Of note, she had a renal ultrasound 06/20/20 which showed bilateral renal cysts, with mildly complicated cyst of the lower pole of the right kidney up to 3.9cm w/ wall irregularities and possible mural nodule. Is scheduled for MR abdomen 08/26/20 for follow up.   - Will repeat renin:aldosterone  - Continue follow up with [facility]

## 2020-08-21 NOTE — Progress Notes (Signed)
Internal Medicine Clinic Attending  Case discussed with Dr. Speakman  At the time of the visit.  We reviewed the resident's history and exam and pertinent patient test results.  I agree with the assessment, diagnosis, and plan of care documented in the resident's note.  

## 2020-08-22 ENCOUNTER — Telehealth: Payer: Self-pay | Admitting: Student

## 2020-08-22 NOTE — Telephone Encounter (Signed)
Called patient to inform her her cholesterol levels look great. Her renal function has down-trended slightly and she is on Jardiance with GFR 32. Recommended she discuss this with her nephrologist in addition to mild hypernatremia, for which I recommended fluid hydration.   - ECHO pending - Aldosterone:renin pending  - Informed her we will not make any medication changes at this time and reminded her of upcoming appointment in clinic for follow up.   Jeralyn Bennett, MD 08/22/2020, 6:34 PM Pager: [phone]

## 2020-08-23 LAB — ALDOSTERONE + RENIN ACTIVITY W/ RATIO
ALDOS/RENIN RATIO: 8.1 (ref 0.0–30.0)
ALDOSTERONE: 6.1 ng/dL (ref 0.0–30.0)
Renin: 0.756 ng/mL/hr (ref 0.167–5.380)

## 2020-08-26 ENCOUNTER — Ambulatory Visit (HOSPITAL_COMMUNITY)
Admission: RE | Admit: 2020-08-26 | Discharge: 2020-08-26 | Disposition: A | Discharge status: Home / Self Care | Payer: Medicare PPO | Source: Ambulatory Visit | Attending: Urology | Admitting: Urology

## 2020-08-26 ENCOUNTER — Other Ambulatory Visit: Payer: Self-pay

## 2020-08-26 DIAGNOSIS — N281 Cyst of kidney, acquired: Secondary | ICD-10-CM | POA: Diagnosis not present

## 2020-08-26 DIAGNOSIS — K862 Cyst of pancreas: Secondary | ICD-10-CM | POA: Insufficient documentation

## 2020-08-26 DIAGNOSIS — K432 Incisional hernia without obstruction or gangrene: Secondary | ICD-10-CM | POA: Diagnosis not present

## 2020-08-26 DIAGNOSIS — K7689 Other specified diseases of liver: Secondary | ICD-10-CM | POA: Diagnosis not present

## 2020-08-26 MED ORDER — GADOBUTROL 1 MMOL/ML IV SOLN
8.0000 mL | Freq: Once | INTRAVENOUS | Status: AC | PRN
  Administered 2020-08-26: 8 mL via INTRAVENOUS

## 2020-09-03 DIAGNOSIS — R3915 Urgency of urination: Secondary | ICD-10-CM | POA: Diagnosis not present

## 2020-09-03 DIAGNOSIS — E1122 Type 2 diabetes mellitus with diabetic chronic kidney disease: Secondary | ICD-10-CM | POA: Diagnosis not present

## 2020-09-03 DIAGNOSIS — Q6102 Congenital multiple renal cysts: Secondary | ICD-10-CM | POA: Diagnosis not present

## 2020-09-03 DIAGNOSIS — R809 Proteinuria, unspecified: Secondary | ICD-10-CM | POA: Diagnosis not present

## 2020-09-03 DIAGNOSIS — I129 Hypertensive chronic kidney disease with stage 1 through stage 4 chronic kidney disease, or unspecified chronic kidney disease: Secondary | ICD-10-CM | POA: Diagnosis not present

## 2020-09-03 DIAGNOSIS — N1832 Chronic kidney disease, stage 3b: Secondary | ICD-10-CM | POA: Diagnosis not present

## 2020-09-06 ENCOUNTER — Ambulatory Visit (HOSPITAL_COMMUNITY): Payer: Medicare PPO

## 2020-09-06 DIAGNOSIS — N281 Cyst of kidney, acquired: Secondary | ICD-10-CM | POA: Diagnosis not present

## 2020-09-13 DIAGNOSIS — K449 Diaphragmatic hernia without obstruction or gangrene: Secondary | ICD-10-CM | POA: Diagnosis not present

## 2020-09-13 DIAGNOSIS — I10 Essential (primary) hypertension: Secondary | ICD-10-CM | POA: Diagnosis not present

## 2020-09-13 DIAGNOSIS — I270 Primary pulmonary hypertension: Secondary | ICD-10-CM | POA: Diagnosis not present

## 2020-09-13 DIAGNOSIS — J984 Other disorders of lung: Secondary | ICD-10-CM | POA: Diagnosis not present

## 2020-09-16 ENCOUNTER — Ambulatory Visit: Payer: Medicare PPO | Admitting: Student

## 2020-09-16 ENCOUNTER — Encounter: Payer: Self-pay | Admitting: Student

## 2020-09-16 ENCOUNTER — Other Ambulatory Visit: Payer: Self-pay

## 2020-09-16 VITALS — BP 151/87 | HR 87 | Temp 98.0°F | Ht 65.0 in | Wt 179.9 lb

## 2020-09-16 DIAGNOSIS — E119 Type 2 diabetes mellitus without complications: Secondary | ICD-10-CM

## 2020-09-16 DIAGNOSIS — I10 Essential (primary) hypertension: Secondary | ICD-10-CM

## 2020-09-16 LAB — GLUCOSE, CAPILLARY: Glucose-Capillary: 141 mg/dL — ABNORMAL HIGH (ref 70–99)

## 2020-09-16 LAB — POCT GLYCOSYLATED HEMOGLOBIN (HGB A1C): Hemoglobin A1C: 6.4 % — AB (ref 4.0–5.6)

## 2020-09-16 NOTE — Progress Notes (Signed)
CC: Hyperglycemia, Hypertension  HPI:  [name] is a 75 y.o. female with a past medical history stated below and presents today for management of her T2DM and HTN. Please see problem based assessment and plan for additional details.  Past Medical History:  Diagnosis Date  . Diabetes mellitus    Diet-controlled. Not on antiglycemic medications.  . 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 HLD (hyperlipidemia)   . Hypertension   . Hypokalemia    recurrent  . Left shoulder pain 11/12/2015  . Seasonal allergies   . Skin complaints 09/15/2017  . Ventral hernia    Incisional ventral hernia. S/P repair by Dr. Hulen Skains  (08/2008)  . Ventricular hypertrophy 05/2006   LVH on ECG (05/2006) - Likely 2/2 HTN.  . 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 Hulen Skains. (08/2006)    Current Outpatient Medications on File Prior to Visit  Medication Sig Dispense Refill  . Accu-Chek FastClix Lancets MISC USE TO PRICK FINGER ONCE DAILY TO MONITOR BLOOD SUGAR 102 each 0  . amLODipine (NORVASC) 10 MG tablet Take 1 tablet (10 mg total) by mouth daily. 90 tablet 3  . Blood Glucose Monitoring Suppl (ACCU-CHEK NANO SMARTVIEW) w/Device KIT 1 each by Does not apply route 1 day or 1 dose. Uses to check blood sugar once daily in the morning. 3 kit 3  . Calcium Carbonate-Vitamin D 600-400 MG-UNIT tablet Take 1 tablet by mouth 2 (two) times daily. 60 tablet 5  . carvedilol (COREG) 3.125 MG tablet Take 1 tablet (3.125 mg total) by mouth 2 (two) times daily. 60 tablet 3  . empagliflozin (JARDIANCE) 10 MG TABS tablet Take 1 tablet (10 mg total) by mouth daily before breakfast. 90 tablet 3  . glucose blood (ACCU-CHEK GUIDE) test strip Check blood sugar 7 times a week. diag code E11.9. Non insulin dependent 100 each 3  . losartan (COZAAR) 50 MG tablet Take 1 tablet (50 mg total) by mouth daily. 90 tablet 3  . rosuvastatin (CRESTOR) 20 MG tablet Take 1 tablet (20 mg total) by  mouth daily. 90 tablet 1   No current facility-administered medications on file prior to visit.    Family History  Problem Relation Age of Onset  . Diabetes Other        1st degree reative in 3 of her siblings    Social History   Socioeconomic History  . Marital status: Widowed    Spouse name: Not on file  . Number of children: Not on file  . Years of education: Not on file  . Highest education level: Not on file  Occupational History  . 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 Occupation: Psychologist, occupational    Comment: [employer]  Tobacco Use  . Smoking status: Former Smoker    Packs/day: 0.05    Years: 6.00    Pack years: 0.30    Types: Cigarettes    Quit date: 09/05/1999    Years since quitting: 21.0  . Smokeless tobacco: Never Used  Substance and Sexual Activity  . Alcohol use: No    Alcohol/week: 0.0 standard drinks  . Drug use: No  . Sexual activity: Not Currently  Other Topics Concern  . Not on file  Social History Narrative   Retired from working as a [employer].   Widowed. (since 1997). Not sexually active since.  Volunteers at [employer].   Social Determinants of Health   Financial Resource Strain: Not on file  Food Insecurity: Not on file  Transportation Needs: Not on file  Physical Activity: Not on file  Stress: Not on file  Social Connections: Not on file  Intimate Partner Violence: Not on file    Review of Systems: ROS negative except for what is noted on the assessment and plan.  Vitals:   09/16/20 0944 09/16/20 0947 09/16/20 1048  BP: (!) 158/67 (!) 145/76 (!) 151/87  Pulse: 98 96 87  Temp: 98 F (36.7 C)    TempSrc: Oral    SpO2: 99%    Weight: 179 lb 14.4 oz (81.6 kg)    Height: '5\' 5"'  (1.651 m)       Physical Exam: Constitutional: well-appearing, sitting in chair, in no acute distress HENT: normocephalic atraumatic Eyes: conjunctiva non-erythematous Neck:  supple Cardiovascular: regular rate Pulmonary/Chest: normal work of breathing on room air MSK: normal bulk and tone Neurological: alert & oriented x 3 Skin: warm and dry Psych: Normal thought process and mood   Assessment & Plan:   See Encounters Tab for problem based charting.  Patient discussed with Dr. Newell Coral, D.O. [facility], PGY-1 Pager: [phone], Phone: [phone] Date 09/16/2020 Time 5:37 PM

## 2020-09-16 NOTE — Assessment & Plan Note (Signed)
Lab Results  Component Value Date   HGBA1C 6.4 (A) 09/16/2020   Assessment: Hgb from 6.4 today. Current regimen of jardiance 10 mg daily, patient doing well.   Plan: -continue jardiance 10 mg daily -recheck A1c in 6 months

## 2020-09-16 NOTE — Patient Instructions (Signed)
Thank you, [name] for allowing [place] to provide your care today. Today we discussed:  Diabetes - Your diabetes number is doing well! Please continue to work on exercise when the weather improves as well as your diet. Please continue to take your medicines  Blood Pressure - Your blood pressure has improved, but is still elevated. I will be reaching out to your kidney doctor to read their notes from your recent visit. Our goal would be to get your top blood pressure number below 130.   I have ordered the following labs for you:   Lab Orders     Glucose, capillary     POC Hbg A1C   Referrals ordered today:   Referral Orders  No referral(s) requested today     I have ordered the following medication/changed the following medications:   Stop the following medications: There are no discontinued medications.   Start the following medications: No orders of the defined types were placed in this encounter.    Follow up: 3 months    Remember: Please remember to check your pressures at home.   Should you have any questions or concerns please call the internal medicine clinic at [phone].     Sanjuana Letters, D.O. [facility]

## 2020-09-16 NOTE — Assessment & Plan Note (Addendum)
BP Readings from Last 3 Encounters:  09/16/20 (!) 151/87  08/19/20 (!) 152/89  06/03/20 (!) 154/97   Assessment: Patient with Hx of HTN, current regimen of amlodipine 10 mg daily, losartan 50 mg daily, and carvedilol 3.125 BID. Tachycardia seems to have improved with addition of carvedilol during the last visit. Her BP continues to remain elevated despite multiple medication adjustments in the past. It appears as though she was on a HCTZ in the past, but this was discontinued by her nephrologist. With her history of CKD, will reach out to patient's nephrologist to see if they have any recommendations for additional BP medications.   Plan: - continue current regimen of amlodipine 10 mg daily, losartan 50 mg daily, and carvedilol 3.125 BID.  - will reach out to pt's nephrologist, Dr. Royce Macadamia, to see if she has any additional recommendations.   Addendum 09/18/20: Dr. Luis Abed recommendations as per below  Increase losartan from 50 to 100 mg mg daily. Continue amlodipine 10 mg and coreg 3.125 BID (6.25 mg daily). Will call patient and inform her of these changes.

## 2020-09-17 NOTE — Progress Notes (Signed)
Internal Medicine Clinic Attending  Case discussed with Dr. Katsadouros  At the time of the visit.  We reviewed the resident's history and exam and pertinent patient test results.  I agree with the assessment, diagnosis, and plan of care documented in the resident's note.  

## 2020-09-18 MED ORDER — LOSARTAN POTASSIUM 50 MG PO TABS: 100.0000 mg | ORAL_TABLET | Freq: Every day | ORAL | 3 refills | Status: DC

## 2020-09-18 NOTE — Addendum Note (Signed)
Addended by: Riesa Pope on: 09/18/2020 06:35 AM   Modules accepted: Orders

## 2020-09-20 ENCOUNTER — Other Ambulatory Visit: Payer: Self-pay

## 2020-09-20 ENCOUNTER — Other Ambulatory Visit: Payer: Self-pay | Admitting: *Deleted

## 2020-09-20 ENCOUNTER — Ambulatory Visit (HOSPITAL_COMMUNITY)
Admission: RE | Admit: 2020-09-20 | Discharge: 2020-09-20 | Disposition: A | Discharge status: Home / Self Care | Payer: Medicare PPO | Source: Ambulatory Visit | Attending: Internal Medicine | Admitting: Internal Medicine

## 2020-09-20 DIAGNOSIS — I77819 Aortic ectasia, unspecified site: Secondary | ICD-10-CM | POA: Diagnosis not present

## 2020-09-20 DIAGNOSIS — I340 Nonrheumatic mitral (valve) insufficiency: Secondary | ICD-10-CM | POA: Insufficient documentation

## 2020-09-20 DIAGNOSIS — E119 Type 2 diabetes mellitus without complications: Secondary | ICD-10-CM | POA: Diagnosis not present

## 2020-09-20 DIAGNOSIS — E785 Hyperlipidemia, unspecified: Secondary | ICD-10-CM | POA: Insufficient documentation

## 2020-09-20 DIAGNOSIS — I10 Essential (primary) hypertension: Secondary | ICD-10-CM

## 2020-09-20 DIAGNOSIS — R011 Cardiac murmur, unspecified: Secondary | ICD-10-CM

## 2020-09-20 DIAGNOSIS — I119 Hypertensive heart disease without heart failure: Secondary | ICD-10-CM | POA: Diagnosis not present

## 2020-09-20 LAB — ECHOCARDIOGRAM COMPLETE
Area-P 1/2: 3.85 cm2
Calc EF: 63.6 %
S' Lateral: 2.6 cm
Single Plane A2C EF: 70.2 %
Single Plane A4C EF: 60.5 %

## 2020-09-20 MED ORDER — ACCU-CHEK FASTCLIX LANCETS MISC
0 refills | Status: DC
Start: 2020-09-20 — End: 2020-12-30

## 2020-09-20 NOTE — Progress Notes (Signed)
  Echocardiogram 2D Echocardiogram has been performed.  Gina Bailey 09/20/2020, 9:42 AM

## 2020-10-02 ENCOUNTER — Encounter: Payer: Self-pay | Admitting: Internal Medicine

## 2020-10-02 ENCOUNTER — Encounter: Payer: Self-pay | Admitting: *Deleted

## 2020-10-02 NOTE — Progress Notes (Unsigned)

## 2020-10-02 NOTE — Progress Notes (Signed)
Things That May Be Affecting Your Health:  Alcohol  Hearing loss  Pain    Depression  Home Safety  Sexual Health  X Diabetes  Lack of physical activity  Stress   Difficulty with daily activities  Loneliness  Tiredness   Drug use  Medicines  Tobacco use  X Falls  Motor Vehicle Safety  Weight   Food choices X Oral Health  Other    YOUR PERSONALIZED HEALTH PLAN : 1. Schedule your next subsequent Medicare Wellness visit in one year 2. Attend all of your regular appointments to address your medical issues 3. Complete the preventative screenings and services   Annual Wellness Visit   Medicare Covered Preventative Screenings and [place] Men and Women Who How Often Need? Date of Last Service Action  Abdominal Aortic Aneurysm Adults with AAA risk factors Once      Alcohol Misuse and Counseling All Adults Screening once a year if no alcohol misuse. Counseling up to 4 face to face sessions. X    Bone Density Measurement  Adults at risk for osteoporosis Once every 2 yrs      Lipid Panel Z13.6 All adults without CV disease Once every 5 yrs       Colorectal Cancer   Stool sample or  Colonoscopy All adults 62 and older   Once every year  Every 10 years        Depression All Adults Once a year X    Diabetes Screening Blood glucose, post glucose load, or GTT Z13.1  All adults at risk  Pre-diabetics  Once per year  Twice per year X     Diabetes  Self-Management Training All adults Diabetics 10 hrs first year; 2 hours subsequent years. Requires Copay     Glaucoma  Diabetics  Family history of glaucoma  African Americans 55 yrs +  Hispanic Americans 69 yrs + Annually - requires coppay      Hepatitis C Z72.89 or F19.20  High Risk for HCV  Born between 1945 and 1965  Annually  Once      HIV Z11.4 All adults based on risk  Annually btw ages 54 & 85 regardless of risk  Annually > 65 yrs if at increased risk      Lung Cancer Screening  Asymptomatic adults aged 18-77 with 30 pack yr history and current smoker OR quit within the last 15 yrs Annually Must have counseling and shared decision making documentation before first screen      Medical Nutrition Therapy Adults with   Diabetes  Renal disease  Kidney transplant within past 3 yrs 3 hours first year; 2 hours subsequent years     Obesity and Counseling All adults Screening once a year Counseling if BMI 30 or higher  Today   Tobacco Use Counseling Adults who use tobacco  Up to 8 visits in one year     Vaccines Z23  Hepatitis B  Influenza   Pneumonia  Adults   Once  Once every flu season  Two different vaccines separated by one year     Next Annual Wellness Visit People with Medicare Every year  Today     Services & Screenings Women Who How Often Need  Date of Last Service Action  Mammogram  Z12.31 Women over 37 One baseline ages 52-39. Annually ager 40 yrs+      Pap tests All women Annually if high risk. Every 2 yrs for normal risk women  Screening for cervical cancer with   Pap (Z01.419 nl or Z01.411abnl) &  HPV Z11.51 Women aged 30 to 36 Once every 5 yrs     Screening pelvic and breast exams All women Annually if high risk. Every 2 yrs for normal risk women     Sexually Transmitted Diseases  Chlamydia  Gonorrhea  Syphilis All at risk adults Annually for non pregnant females at increased risk         [place] Men Who How Ofter Need  Date of Last Service Action  Prostate Cancer - DRE & PSA Men over 50 Annually.  DRE might require a copay.        Sexually Transmitted Diseases  Syphilis All at risk adults Annually for men at increased risk      Health Maintenance List Health Maintenance  Topic Date Due  . TETANUS/TDAP  06/03/2020  . FOOT EXAM  06/12/2020  . HEMOGLOBIN A1C  12/14/2020  . OPHTHALMOLOGY EXAM  04/18/2021  . INFLUENZA VACCINE  Completed  . DEXA SCAN  Completed  . COVID-19 Vaccine  Completed  .  Hepatitis C Screening  Completed  . PNA vac Low Risk Adult  Completed  . HPV VACCINES  Aged Out

## 2020-10-15 ENCOUNTER — Encounter: Payer: Self-pay | Admitting: *Deleted

## 2020-10-15 DIAGNOSIS — H25813 Combined forms of age-related cataract, bilateral: Secondary | ICD-10-CM | POA: Diagnosis not present

## 2020-10-15 DIAGNOSIS — H04123 Dry eye syndrome of bilateral lacrimal glands: Secondary | ICD-10-CM | POA: Diagnosis not present

## 2020-10-15 DIAGNOSIS — E119 Type 2 diabetes mellitus without complications: Secondary | ICD-10-CM | POA: Diagnosis not present

## 2020-10-15 DIAGNOSIS — H1045 Other chronic allergic conjunctivitis: Secondary | ICD-10-CM | POA: Diagnosis not present

## 2020-10-15 DIAGNOSIS — H43812 Vitreous degeneration, left eye: Secondary | ICD-10-CM | POA: Diagnosis not present

## 2020-10-15 LAB — HM DIABETES EYE EXAM

## 2020-10-18 ENCOUNTER — Other Ambulatory Visit: Payer: Self-pay | Admitting: Internal Medicine

## 2020-10-18 DIAGNOSIS — Z1231 Encounter for screening mammogram for malignant neoplasm of breast: Secondary | ICD-10-CM

## 2020-11-11 ENCOUNTER — Encounter: Payer: Self-pay | Admitting: Student

## 2020-11-11 DIAGNOSIS — I77810 Thoracic aortic ectasia: Secondary | ICD-10-CM | POA: Insufficient documentation

## 2020-11-11 DIAGNOSIS — I340 Nonrheumatic mitral (valve) insufficiency: Secondary | ICD-10-CM | POA: Insufficient documentation

## 2020-11-12 DIAGNOSIS — N1832 Chronic kidney disease, stage 3b: Secondary | ICD-10-CM | POA: Diagnosis not present

## 2020-11-21 DIAGNOSIS — N184 Chronic kidney disease, stage 4 (severe): Secondary | ICD-10-CM | POA: Diagnosis not present

## 2020-11-21 DIAGNOSIS — R809 Proteinuria, unspecified: Secondary | ICD-10-CM | POA: Diagnosis not present

## 2020-11-21 DIAGNOSIS — E1122 Type 2 diabetes mellitus with diabetic chronic kidney disease: Secondary | ICD-10-CM | POA: Diagnosis not present

## 2020-11-21 DIAGNOSIS — I129 Hypertensive chronic kidney disease with stage 1 through stage 4 chronic kidney disease, or unspecified chronic kidney disease: Secondary | ICD-10-CM | POA: Diagnosis not present

## 2020-11-21 DIAGNOSIS — E1129 Type 2 diabetes mellitus with other diabetic kidney complication: Secondary | ICD-10-CM | POA: Diagnosis not present

## 2020-11-21 DIAGNOSIS — Q6102 Congenital multiple renal cysts: Secondary | ICD-10-CM | POA: Diagnosis not present

## 2020-12-04 ENCOUNTER — Other Ambulatory Visit: Payer: Self-pay | Admitting: *Deleted

## 2020-12-04 MED ORDER — ROSUVASTATIN CALCIUM 20 MG PO TABS: 20.0000 mg | ORAL_TABLET | Freq: Every day | ORAL | 3 refills | Status: DC

## 2020-12-07 ENCOUNTER — Encounter: Payer: Self-pay | Admitting: *Deleted

## 2020-12-10 ENCOUNTER — Ambulatory Visit
Admission: RE | Admit: 2020-12-10 | Discharge: 2020-12-10 | Disposition: A | Discharge status: Home / Self Care | Payer: Medicare PPO | Source: Ambulatory Visit | Attending: Internal Medicine | Admitting: Internal Medicine

## 2020-12-10 ENCOUNTER — Other Ambulatory Visit: Payer: Self-pay

## 2020-12-10 DIAGNOSIS — Z1231 Encounter for screening mammogram for malignant neoplasm of breast: Secondary | ICD-10-CM | POA: Diagnosis not present

## 2020-12-17 ENCOUNTER — Other Ambulatory Visit: Payer: Self-pay

## 2020-12-17 ENCOUNTER — Ambulatory Visit: Payer: Medicare PPO | Admitting: Student

## 2020-12-17 ENCOUNTER — Encounter: Payer: Self-pay | Admitting: Student

## 2020-12-17 VITALS — BP 168/93 | HR 82 | Temp 98.5°F | Ht 65.0 in | Wt 178.4 lb

## 2020-12-17 DIAGNOSIS — E119 Type 2 diabetes mellitus without complications: Secondary | ICD-10-CM | POA: Diagnosis not present

## 2020-12-17 DIAGNOSIS — I10 Essential (primary) hypertension: Secondary | ICD-10-CM | POA: Diagnosis not present

## 2020-12-17 LAB — GLUCOSE, CAPILLARY: Glucose-Capillary: 134 mg/dL — ABNORMAL HIGH (ref 70–99)

## 2020-12-17 LAB — POCT GLYCOSYLATED HEMOGLOBIN (HGB A1C): Hemoglobin A1C: 7.3 % — AB (ref 4.0–5.6)

## 2020-12-17 MED ORDER — HYDRALAZINE HCL 25 MG PO TABS: 25.0000 mg | ORAL_TABLET | Freq: Three times a day (TID) | ORAL | 11 refills | Status: DC

## 2020-12-17 NOTE — Progress Notes (Signed)
CC: Hypertension  HPI:  Gina Bailey is a 75 y.o. female with a past medical history stated below and presents today for follow up concerning hypertension managment. Please see problem based assessment and plan for additional details.  Past Medical History:  Diagnosis Date  . Diabetes mellitus    Diet-controlled. Not on antiglycemic medications.  . 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 HLD (hyperlipidemia)   . Hypertension   . Hypokalemia    recurrent  . Left shoulder pain 11/12/2015  . Seasonal allergies   . Skin complaints 09/15/2017  . Ventral hernia    Incisional ventral hernia. S/P repair by Dr. Hulen Skains  (08/2008)  . Ventricular hypertrophy 05/2006   LVH on ECG (05/2006) - Likely 2/2 HTN.  . 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 Hulen Skains. (08/2006)    Current Outpatient Medications on File Prior to Visit  Medication Sig Dispense Refill  . Accu-Chek FastClix Lancets MISC USE TO PRICK FINGER ONCE DAILY TO MONITOR BLOOD SUGAR 102 each 0  . amLODipine (NORVASC) 10 MG tablet Take 1 tablet (10 mg total) by mouth daily. 90 tablet 3  . Blood Glucose Monitoring Suppl (ACCU-CHEK NANO SMARTVIEW) w/Device KIT 1 each by Does not apply route 1 day or 1 dose. Uses to check blood sugar once daily in the morning. 3 kit 3  . Calcium Carbonate-Vitamin D 600-400 MG-UNIT tablet Take 1 tablet by mouth 2 (two) times daily. 60 tablet 5  . carvedilol (COREG) 3.125 MG tablet Take 1 tablet (3.125 mg total) by mouth 2 (two) times daily. 60 tablet 3  . empagliflozin (JARDIANCE) 10 MG TABS tablet Take 1 tablet (10 mg total) by mouth daily before breakfast. 90 tablet 3  . glucose blood (ACCU-CHEK GUIDE) test strip Check blood sugar 7 times a week. diag code E11.9. Non insulin dependent 100 each 3  . losartan (COZAAR) 50 MG tablet Take 2 tablets (100 mg total) by mouth daily. 90 tablet 3  . rosuvastatin (CRESTOR) 20 MG tablet Take 1 tablet (20 mg total) by  mouth daily. 90 tablet 3   No current facility-administered medications on file prior to visit.    Family History  Problem Relation Age of Onset  . Diabetes Other        1st degree reative in 3 of her siblings  . Breast cancer Mother        not sure of age    Social History   Socioeconomic History  . Marital status: Widowed    Spouse name: Not on file  . Number of children: Not on file  . Years of education: Not on file  . Highest education level: Not on file  Occupational History  . 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 Occupation: Psychologist, occupational    Comment: [employer]  Tobacco Use  . Smoking status: Former Smoker    Packs/day: 0.05    Years: 6.00    Pack years: 0.30    Types: Cigarettes    Quit date: 09/05/1999    Years since quitting: 21.2  . Smokeless tobacco: Never Used  Substance and Sexual Activity  . Alcohol use: No    Alcohol/week: 0.0 standard drinks  . Drug use: No  . Sexual activity: Not Currently  Other Topics Concern  . 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   Widowed. (since 1997). Not sexually active since.   Volunteers at [employer].   Social Determinants of Health   Financial Resource Strain: Not on file  Food Insecurity: Not on file  Transportation Needs: Not on file  Physical Activity: Not on file  Stress: Not on file  Social Connections: Not on file  Intimate Partner Violence: Not on file    Review of Systems: ROS negative except for what is noted on the assessment and plan.  Vitals:   12/17/20 0955 12/17/20 0957 12/17/20 1048  BP: (!) 166/85 (!) 161/89 (!) 168/93  Pulse: 96 92 82  Temp: 98.5 F (36.9 C)    TempSrc: Oral    SpO2: 98%    Weight: 178 lb 6.4 oz (80.9 kg)    Height: 5' 5" (1.651 m)       Physical Exam: Constitutional: well-appearing, sitting in chair, in no acute distress HENT: normocephalic  atraumatic Eyes: conjunctiva non-erythematous Neck: supple Cardiovascular: regular rate and rhythm, systolic murmur, best appreciated over left lower sternal border Pulmonary/Chest: normal work of breathing on room air, lungs clear to auscultation bilaterally MSK: normal bulk and tone Neurological: alert & oriented x 3 Skin: warm and dry Psych: normal mood and thought process   Assessment & Plan:   See Encounters Tab for problem based charting.  Patient discussed with Dr. Vincent  Vasili , D.O. [facility], PGY-1 Pager: [phone], Phone: [phone] Date 12/17/2020 Time 7:31 PM 

## 2020-12-17 NOTE — Assessment & Plan Note (Signed)
BP Readings from Last 3 Encounters:  12/17/20 (!) 168/93  09/16/20 (!) 151/87  08/19/20 (!) 152/89   Assessment: Patient continues to have episodes of hypertension and is above goal. Current regimen of losartan 100 mg daily, amlodipine 10 g daily and coreg 3.125 BID. However, patient has multiple prescriptions for coreg at different doses. She is unsure as to what does she has been taking. Two of the prescription bottles were from 05/22, one for 6.25 and one for 12.5. She did not know which she should be taking.   The patient endorsed adherence to taking all of her medications, however, her last amlodipine bottle was not refilled for some time so I question if she was adhered to this medication. Regardless, because her pressure is still elevated, we will add hydralazine 25 mg TID. Patient to return to clinic in one month for HTN recheck .Patient and I went over medication regimen multiple times prior to her leaving the clinic.  Plan: -Continue amlodipine 10 mg daily, coreg 6.25 BID, losartan 100 mg daily -Start hydralazine 25 mg TID -Return to clinic in 1 month for BP recheck

## 2020-12-17 NOTE — Patient Instructions (Signed)
Thank you, GinaGina Bailey for allowing [place] to provide your care today. Today we discussed   High blood pressure Gina Bailey your blood pressure remains elevated.  We will be starting you on hydralazine, a blood pressure medication.  Diabetes Please continue to take your Jardiance daily.  Please continue to work on a healthy diet as well as exercising daily as tolerable.   I have ordered the following labs for you:   Lab Orders     Glucose, capillary     POC Hbg A1C    Referrals ordered today:   Referral Orders  No referral(s) requested today     I have ordered the following medication/changed the following medications:   Stop the following medications: There are no discontinued medications.   Start the following medications: No orders of the defined types were placed in this encounter.    Follow up: 1 month blood pressure recheck   Should you have any questions or concerns please call the internal medicine clinic at [phone].     Gina Bailey, D.O. [place]

## 2020-12-17 NOTE — Assessment & Plan Note (Addendum)
Lab Results  Component Value Date   HGBA1C 7.3 (A) 12/17/2020   Assessment: A1c has increased from 6.4 to 7.3. Current regimen of jardiance 10 mg daily. We discussed importance of medication adherence, diet, and exercise. The patient states she works in her garden regularly.   She was unable to tolerate metformin in the past and with her age/comorbidities, I do not believe being aggressive with additional medications would be beneficial at this time.   Plan: -repeat A1c in 3 months -continue jardiance 10 mg. -diabetic foot exam performed during this visit

## 2020-12-18 NOTE — Progress Notes (Signed)
Internal Medicine Clinic Attending  Case discussed with Dr. Katsadouros  At the time of the visit.  We reviewed the resident's history and exam and pertinent patient test results.  I agree with the assessment, diagnosis, and plan of care documented in the resident's note.  

## 2020-12-23 ENCOUNTER — Telehealth: Payer: Self-pay

## 2020-12-23 ENCOUNTER — Other Ambulatory Visit: Payer: Self-pay | Admitting: Internal Medicine

## 2020-12-23 DIAGNOSIS — I10 Essential (primary) hypertension: Secondary | ICD-10-CM

## 2020-12-23 MED ORDER — CARVEDILOL 6.25 MG PO TABS: 6.2500 mg | ORAL_TABLET | Freq: Two times a day (BID) | ORAL | 3 refills | Status: DC

## 2020-12-23 NOTE — Telephone Encounter (Signed)
Dr. Jimmye Norman,  Received a fax from [facility] for a RX request for Carvedilol 3.25mg , BID dosing. Please review Dr. Marthann Schiller LOV note from 12/17/20 regarding Carvedilol dose.  I called the pharmacy, the pharmacist informed me that the patient just picked up Carvedilol 6.25, BID dosing, 90 day supply from Dr. Harrie Jeans (Nephrology), this new dose is not on her med rec.Marland Kitchen  Pharmacist is cancelling the 3.25mg  dose request.  Patient has an upcoming appt w/ you on 01/23/21.  Thanks, [employer]

## 2020-12-25 DIAGNOSIS — N184 Chronic kidney disease, stage 4 (severe): Secondary | ICD-10-CM | POA: Diagnosis not present

## 2020-12-25 DIAGNOSIS — E1122 Type 2 diabetes mellitus with diabetic chronic kidney disease: Secondary | ICD-10-CM | POA: Diagnosis not present

## 2020-12-25 DIAGNOSIS — E1129 Type 2 diabetes mellitus with other diabetic kidney complication: Secondary | ICD-10-CM | POA: Diagnosis not present

## 2020-12-25 DIAGNOSIS — R809 Proteinuria, unspecified: Secondary | ICD-10-CM | POA: Diagnosis not present

## 2020-12-25 DIAGNOSIS — Q6102 Congenital multiple renal cysts: Secondary | ICD-10-CM | POA: Diagnosis not present

## 2020-12-25 DIAGNOSIS — I129 Hypertensive chronic kidney disease with stage 1 through stage 4 chronic kidney disease, or unspecified chronic kidney disease: Secondary | ICD-10-CM | POA: Diagnosis not present

## 2020-12-28 ENCOUNTER — Other Ambulatory Visit: Payer: Self-pay | Admitting: Student

## 2020-12-28 DIAGNOSIS — E119 Type 2 diabetes mellitus without complications: Secondary | ICD-10-CM

## 2021-01-03 DIAGNOSIS — E870 Hyperosmolality and hypernatremia: Secondary | ICD-10-CM | POA: Diagnosis not present

## 2021-01-13 DIAGNOSIS — H43812 Vitreous degeneration, left eye: Secondary | ICD-10-CM | POA: Diagnosis not present

## 2021-01-13 DIAGNOSIS — H04123 Dry eye syndrome of bilateral lacrimal glands: Secondary | ICD-10-CM | POA: Diagnosis not present

## 2021-01-13 DIAGNOSIS — H25812 Combined forms of age-related cataract, left eye: Secondary | ICD-10-CM | POA: Diagnosis not present

## 2021-01-13 DIAGNOSIS — H1045 Other chronic allergic conjunctivitis: Secondary | ICD-10-CM | POA: Diagnosis not present

## 2021-01-13 DIAGNOSIS — E119 Type 2 diabetes mellitus without complications: Secondary | ICD-10-CM | POA: Diagnosis not present

## 2021-01-13 DIAGNOSIS — H25813 Combined forms of age-related cataract, bilateral: Secondary | ICD-10-CM | POA: Diagnosis not present

## 2021-01-23 ENCOUNTER — Ambulatory Visit: Payer: Medicare PPO | Admitting: Internal Medicine

## 2021-01-23 VITALS — BP 152/66 | HR 100 | Temp 98.6°F | Ht 65.0 in | Wt 179.0 lb

## 2021-01-23 DIAGNOSIS — I10 Essential (primary) hypertension: Secondary | ICD-10-CM

## 2021-01-23 DIAGNOSIS — K862 Cyst of pancreas: Secondary | ICD-10-CM | POA: Diagnosis not present

## 2021-01-23 DIAGNOSIS — Z0000 Encounter for general adult medical examination without abnormal findings: Secondary | ICD-10-CM | POA: Diagnosis not present

## 2021-01-23 DIAGNOSIS — R0989 Other specified symptoms and signs involving the circulatory and respiratory systems: Secondary | ICD-10-CM | POA: Insufficient documentation

## 2021-01-23 DIAGNOSIS — N1832 Chronic kidney disease, stage 3b: Secondary | ICD-10-CM | POA: Diagnosis not present

## 2021-01-23 DIAGNOSIS — I739 Peripheral vascular disease, unspecified: Secondary | ICD-10-CM

## 2021-01-23 DIAGNOSIS — E119 Type 2 diabetes mellitus without complications: Secondary | ICD-10-CM | POA: Diagnosis not present

## 2021-01-23 NOTE — Progress Notes (Signed)
Gina Bailey presents today for routine follow-up of her chronic conditions including HTN, type II DM, and CKD.  She reports that she has been feeling well and has no complaints.  She brought her medicines today and it is clear that she is quite confused about what she is taking.  Follows with [facility] with Dr. Royce Macadamia for her CKD and states that potassium 10 mill equivalents daily was added within the last couple of weeks.  Unfortunately she also reports that she has been out of Lasix prescribed 40 mg 3 times daily for up to a week.  Initially she reported that she had taken all of her antihypertensive this morning, but when I went through them one by one, she reported that she may not have after all.  At this point she was quite confused.     Out of lasix 40 tid for up to a week, confused. Bottle is em pt  BP (!) 152/66 (BP Location: Right Arm, Patient Position: Sitting, Cuff Size: Small)   Pulse 100   Temp 98.6 F (37 C) (Oral)   Ht 5\' 5"  (1.651 m)   Wt 179 lb (81.2 kg)   LMP 10/22/1978   SpO2 99%   BMI 29.79 kg/m   Lungs clear Heavy tight edema thick ankles no dp pulses long crumbly tonails  Good sensation feet Callus forming on 2nd dorsal toes  Assessment and plan (see also problem based charting)  Medication management is my greatest concern today.  She does not seem to have a good system in place to feel to maintain organization.  Today I am concerned that she seems to have run out of her Lasix 40 3 times daily but in the meantime has been initiated on potassium supplementation.  Because she recently saw Dr. Royce Macadamia at [facility], I will not recheck the BMP today but will check to see if we have received lab results from that office. I will also refer to pharmacy for assistance with medication management and education.  No changes in regimen otherwise today, hasn't taken all bp meds today recheck at short interval for blood pressure.  I may try to reach out to Dr. Royce Macadamia to  see if I can assume responsibility for blood pressure and CKD management to minimize confusion from multiple prescribers.  Refer to pharmacy for med org

## 2021-01-23 NOTE — Patient Instructions (Signed)
[name], I enjoyed meeting you today and look forward to working together on your health care goals!  We reviewed your medicines and examined your feet today. I have referred you to a foot specialist and you should qualify for diabetic shoes.  I am not making any changes to your medicines today, but will ask our pharmacist partners here to contact you about ideas for organizing your medicines.  You will be due for a diabetes blood test at  your next visit.  Take care and stay well!  Dr. Jimmye Norman

## 2021-01-28 ENCOUNTER — Other Ambulatory Visit: Payer: Self-pay

## 2021-01-28 DIAGNOSIS — E119 Type 2 diabetes mellitus without complications: Secondary | ICD-10-CM

## 2021-01-28 MED ORDER — ACCU-CHEK GUIDE VI STRP: ORAL_STRIP | 3 refills | Status: DC

## 2021-01-30 ENCOUNTER — Encounter: Payer: Self-pay | Admitting: Internal Medicine

## 2021-01-30 NOTE — Assessment & Plan Note (Signed)
BP 152/66, though she is not sure if she took all her antihypertensive this morning thus I will not make any changes.  Also seeing Dr. Royce Macadamia who would adjust her antihypertensives.  Prescribed is amlodipine 10 mg daily, carvedilol 6.25 mg twice daily, hydralazine 25 mg 3 times daily, losartan 50 mg 2 p.o. daily.  It may be easiest if I take responsibility for blood pressure and CKD management and monitoring to minimize confusion due to multiple prescribers.

## 2021-01-30 NOTE — Assessment & Plan Note (Signed)
Recent visit with Dr. Royce Macadamia at [facility], note not currently available to me.  Evidently potassium 10 mill equivalents daily was begun, although her Lasix bottle is empty and she feels that she ran out about a week ago.  This is a rather concerning discrepancy.  Med management going forward will require a better organizational system.

## 2021-01-30 NOTE — Assessment & Plan Note (Signed)
Mammogram recently completed

## 2021-01-30 NOTE — Assessment & Plan Note (Signed)
A1c will be due 03/08/2021. Eye exam no retinopathy 09/2020 Foot exam with normal sensation to monofilament.  Callus forming on the dorsal second toes.  Dorsalis pedis pulses not palpable.  Normal color, toes are warm, good cap refill.  Toenails crumbling and mycotic.

## 2021-02-05 ENCOUNTER — Encounter: Payer: Self-pay | Admitting: Podiatry

## 2021-02-05 ENCOUNTER — Ambulatory Visit: Payer: Medicare PPO | Admitting: Podiatry

## 2021-02-05 ENCOUNTER — Other Ambulatory Visit: Payer: Self-pay

## 2021-02-05 DIAGNOSIS — M79674 Pain in right toe(s): Secondary | ICD-10-CM | POA: Diagnosis not present

## 2021-02-05 DIAGNOSIS — E119 Type 2 diabetes mellitus without complications: Secondary | ICD-10-CM

## 2021-02-05 DIAGNOSIS — B351 Tinea unguium: Secondary | ICD-10-CM

## 2021-02-05 DIAGNOSIS — N1832 Chronic kidney disease, stage 3b: Secondary | ICD-10-CM

## 2021-02-05 DIAGNOSIS — M79675 Pain in left toe(s): Secondary | ICD-10-CM | POA: Diagnosis not present

## 2021-02-05 NOTE — Progress Notes (Signed)
This patient returns to my office for at risk foot care.  This patient requires this care by a professional since this patient will be at risk due to having type 2 diabetes and CKD.  This patient is unable to cut nails herself since the patient cannot reach her nails.These nails are painful walking and wearing shoes.  This patient presents for at risk foot care today.  General Appearance  Alert, conversant and in no acute stress.  Vascular  Dorsalis pedis are palpable  bilaterally.  Posterior tibial pulses are absent.Capillary return is within normal limits  bilaterally. Temperature is within normal limits  bilaterally.  Neurologic  Senn-Weinstein monofilament wire test within normal limits  bilaterally. Muscle power within normal limits bilaterally.  Nails Thick disfigured discolored nails with subungual debris  from hallux to fifth toes bilaterally. No evidence of bacterial infection or drainage bilaterally.  Orthopedic  No limitations of motion  feet .  No crepitus or effusions noted.  No bony pathology or digital deformities noted. Hammer toe second  B/L.  Skin  normotropic skin with no porokeratosis noted bilaterally.  No signs of infections or ulcers noted.     Onychomycosis  Pain in right toes  Pain in left toes  Consent was obtained for treatment procedures.   Mechanical debridement of nails 1-5  bilaterally performed with a nail nipper.  Filed with dremel without incident.    Return office visit     3 months                Told patient to return for periodic foot care and evaluation due to potential at risk complications.   Gardiner Barefoot DPM

## 2021-02-10 ENCOUNTER — Ambulatory Visit (INDEPENDENT_AMBULATORY_CARE_PROVIDER_SITE_OTHER): Payer: Medicare PPO | Admitting: Pharmacist

## 2021-02-10 ENCOUNTER — Other Ambulatory Visit: Payer: Self-pay | Admitting: Internal Medicine

## 2021-02-10 ENCOUNTER — Other Ambulatory Visit: Payer: Self-pay

## 2021-02-10 DIAGNOSIS — N1832 Chronic kidney disease, stage 3b: Secondary | ICD-10-CM

## 2021-02-10 NOTE — Progress Notes (Signed)
   Subjective:    Patient ID: Gina Bailey, female    DOB: [birth year], 75 y.o.   MRN: [id number]  HPI  Patient is a 75 y.o. female who presents for medication review and management. She is in good spirits and presents without assistance. Patient was referred and last seen by Primary Care Provider on 01/23/21.  At last provider appointment there was concern patient was not taking her furosemide that was prescribed by Dr. Royce Macadamia at [facility] but that she was taking her potassium 64meq supplement daily. Patient presented with medications today and did not have furosemide and was not aware she needed this medication. She was still taking the potassium supplement daily.   Medication Adherence Questionnaire (A score of 2 or more points indicates risk for nonadherence)  Do you know what each of your medicines is for? 1 (1 point if no)  Do you ever have trouble remembering to take your medicine? 0 (2 points if yes)  Do you ever not take a medicine because you feel you do not need it?  0 (1 point if yes)  Do you think that any of your medicines is not helping you? 0 "I don't know I felt like at one point I was over-medicated but I am doing better now" (1 point if yes)  Do you have any physical problems such as vision loss that keep you from taking your medicines as prescribed?  0 (2 points if yes)  Do you think any of your medicine is causing a side effect? 0 (1 point if yes)  Do you know the names of ALL of your medicines? 0 (1 point if no)  Do you think that you need ALL of your medicines? 1 (1 point if no)  In the past 6 months, have you missed getting a refill or a new prescription filled on time? 1; out of Furosemide (1 point if yes)  How often do you miss taking a dose of medicine?  1 Never (0 points), 1 or 2 times a month (1 points), 1 time a week (2 points), 2 or more times a week (3 points).   TOTAL SCORE 4/14    Objective:   Labs:   Physical Exam Neurological:     Mental Status:  She is alert and oriented to person, place, and time.    Assessment/Plan:   Understanding of regimen: fair  Understanding of indications: fair  Potential of compliance: fair  Patient has known adherence challenges based on score of 4 for questionnaire. Barriers include: lack of knowledge and forgetfulness. Medication list reviewed and updated. Patient was provided with a printed medication list. Patient agreed to have all of her medications now sent to [facility] so that they can package the medications for her in blister packaging. Patient does not want a mail order service. Discussed patient with Dr. Jimmye Norman. Dr. Jimmye Norman will send in all necessary medications to [facility].  Follow-up appointment with provider. Written patient instructions provided.  This appointment required 35 minutes of direct patient care.  Thank you for involving pharmacy to assist in providing this patient's care.

## 2021-02-10 NOTE — Addendum Note (Signed)
Addended by: Dorian Pod A on: 02/10/2021 11:30 AM   Modules accepted: Orders

## 2021-02-10 NOTE — Patient Instructions (Signed)
Ms. Kluender it was a pleasure seeing you today.   Today we reviewed all of the medications you are currently taking. Included is an updated medication list. Please continue taking all medications as prescribed on this list.  To help you remember to take your medications Dr. Jimmye Norman is going to send them to [facility] so they can package them to make it easier for you.   If you have any questions please call the clinic and ask to speak with me.  Follow-up with Dr. Jimmye Norman at next scheduled appointment.

## 2021-02-11 LAB — BMP8+ANION GAP
Anion Gap: 15 mmol/L (ref 10.0–18.0)
BUN/Creatinine Ratio: 11 — ABNORMAL LOW (ref 12–28)
BUN: 22 mg/dL (ref 8–27)
CO2: 26 mmol/L (ref 20–29)
Calcium: 9.2 mg/dL (ref 8.7–10.3)
Chloride: 106 mmol/L (ref 96–106)
Creatinine, Ser: 1.98 mg/dL — ABNORMAL HIGH (ref 0.57–1.00)
Glucose: 112 mg/dL — ABNORMAL HIGH (ref 65–99)
Potassium: 3.8 mmol/L (ref 3.5–5.2)
Sodium: 147 mmol/L — ABNORMAL HIGH (ref 134–144)
eGFR: 26 mL/min/{1.73_m2} — ABNORMAL LOW (ref >59)

## 2021-02-20 DIAGNOSIS — R809 Proteinuria, unspecified: Secondary | ICD-10-CM | POA: Diagnosis not present

## 2021-02-20 DIAGNOSIS — N184 Chronic kidney disease, stage 4 (severe): Secondary | ICD-10-CM | POA: Diagnosis not present

## 2021-02-20 DIAGNOSIS — I129 Hypertensive chronic kidney disease with stage 1 through stage 4 chronic kidney disease, or unspecified chronic kidney disease: Secondary | ICD-10-CM | POA: Diagnosis not present

## 2021-02-20 DIAGNOSIS — E1122 Type 2 diabetes mellitus with diabetic chronic kidney disease: Secondary | ICD-10-CM | POA: Diagnosis not present

## 2021-02-20 DIAGNOSIS — E876 Hypokalemia: Secondary | ICD-10-CM | POA: Diagnosis not present

## 2021-02-20 DIAGNOSIS — Q6102 Congenital multiple renal cysts: Secondary | ICD-10-CM | POA: Diagnosis not present

## 2021-02-28 NOTE — Addendum Note (Signed)
Addended by: Jodean Lima on: 02/28/2021 09:55 AM   Modules accepted: Orders

## 2021-03-13 ENCOUNTER — Encounter: Payer: Medicare PPO | Admitting: Internal Medicine

## 2021-03-15 ENCOUNTER — Other Ambulatory Visit: Payer: Self-pay | Admitting: Student

## 2021-03-15 DIAGNOSIS — I10 Essential (primary) hypertension: Secondary | ICD-10-CM

## 2021-03-20 ENCOUNTER — Encounter: Payer: Medicare PPO | Admitting: Internal Medicine

## 2021-03-26 ENCOUNTER — Encounter: Payer: Medicare PPO | Admitting: Internal Medicine

## 2021-03-26 ENCOUNTER — Ambulatory Visit: Payer: Medicare PPO | Admitting: Internal Medicine

## 2021-03-26 VITALS — BP 164/84 | HR 88 | Temp 98.7°F | Ht 65.0 in | Wt 177.0 lb

## 2021-03-26 DIAGNOSIS — E119 Type 2 diabetes mellitus without complications: Secondary | ICD-10-CM | POA: Diagnosis not present

## 2021-03-26 DIAGNOSIS — I10 Essential (primary) hypertension: Secondary | ICD-10-CM

## 2021-03-26 DIAGNOSIS — Z23 Encounter for immunization: Secondary | ICD-10-CM

## 2021-03-26 DIAGNOSIS — E1122 Type 2 diabetes mellitus with diabetic chronic kidney disease: Secondary | ICD-10-CM | POA: Diagnosis not present

## 2021-03-26 DIAGNOSIS — N1832 Chronic kidney disease, stage 3b: Secondary | ICD-10-CM

## 2021-03-26 DIAGNOSIS — N184 Chronic kidney disease, stage 4 (severe): Secondary | ICD-10-CM

## 2021-03-26 LAB — GLUCOSE, CAPILLARY: Glucose-Capillary: 107 mg/dL — ABNORMAL HIGH (ref 70–99)

## 2021-03-26 LAB — POCT GLYCOSYLATED HEMOGLOBIN (HGB A1C): Hemoglobin A1C: 6.8 % — AB (ref 4.0–5.6)

## 2021-03-26 NOTE — Patient Instructions (Signed)
Ms. Doylene Canning, It was great to see you today!  We reviewed your medicines today and discovered that you are missing your lasix = furosemide.  You should be taking 40 mg once a day according to Dr. Luis Abed prescription.  Give it a look!  We will check kidney function today and provide a flu shot.  Your diabetes looks great!!!!   See you in about 3 months, and I hope your cataract surgery goes well.    Take care and stay well, Dr. Jimmye Norman

## 2021-03-26 NOTE — Progress Notes (Signed)
Gina Bailey is here for routine follow-up of chronic conditions notably HTN and stage IIIb CKD, currently co-managed with nephrologist Dr. Royce Macadamia.  My last visits with her were on 01/23/21 and 02/10/2021.  Dr. Royce Macadamia visit 02/20/21 changed lasix to 40 mg daily, she wrote prescription.  Gina Bailey doesn't have it with her today and doesn't know where it could be.  She thinks she remembers picking it up.  I will need to add back KCL ER 10 meq daily as maintained on Dr. Luis Abed med list.  Gina Bailey reports that she has been feeling fairly well lately and has no acute problems to share.  She has been enjoying working in her garden every morning.  Cataract surgery Friday, dil will take her, she will give her own drops, qid.  Most of our visit today was spent reviewing and reconciling medications.  She brought her meds with her.  BP (!) 164/84 (BP Location: Left Arm, Patient Position: Sitting, Cuff Size: Small)   Pulse 88   Temp 98.7 F (37.1 C) (Oral)   Ht 5\' 5"  (1.651 m)   Wt 177 lb (80.3 kg)   LMP 10/22/1978   SpO2 100%   BMI 29.45 kg/m   Impeccably dressed and groomed as always, "I try to look good when I go to the clinic!".  Breathing easily.  Becomes confused when we are reviewing her medicines though she strives hard to make sure she understands and appropriately adheres.  HR regular, no LE edema.   Assessment and Plan (see also problem based documentation) : Doing well with respect to DM2, though HTN control remains suboptimal despite 4 drug regimen.  No changes today.  I continue to worry about her ability to safely take her complex regimen, though she always comes prepared and works hard to ensure understanding.  [place] planned for upcoming visit.

## 2021-03-27 LAB — BMP8+ANION GAP
Anion Gap: 18 mmol/L (ref 10.0–18.0)
BUN/Creatinine Ratio: 8 — ABNORMAL LOW (ref 12–28)
BUN: 19 mg/dL (ref 8–27)
CO2: 25 mmol/L (ref 20–29)
Calcium: 9.9 mg/dL (ref 8.7–10.3)
Chloride: 105 mmol/L (ref 96–106)
Creatinine, Ser: 2.32 mg/dL — ABNORMAL HIGH (ref 0.57–1.00)
Glucose: 102 mg/dL — ABNORMAL HIGH (ref 65–99)
Potassium: 3.6 mmol/L (ref 3.5–5.2)
Sodium: 148 mmol/L — ABNORMAL HIGH (ref 134–144)
eGFR: 21 mL/min/{1.73_m2} — ABNORMAL LOW (ref >59)

## 2021-03-28 DIAGNOSIS — H25812 Combined forms of age-related cataract, left eye: Secondary | ICD-10-CM | POA: Diagnosis not present

## 2021-04-08 ENCOUNTER — Encounter: Payer: Self-pay | Admitting: Internal Medicine

## 2021-04-08 DIAGNOSIS — N184 Chronic kidney disease, stage 4 (severe): Secondary | ICD-10-CM | POA: Insufficient documentation

## 2021-04-08 DIAGNOSIS — E1122 Type 2 diabetes mellitus with diabetic chronic kidney disease: Secondary | ICD-10-CM | POA: Insufficient documentation

## 2021-04-08 DIAGNOSIS — N189 Chronic kidney disease, unspecified: Secondary | ICD-10-CM | POA: Insufficient documentation

## 2021-04-08 DIAGNOSIS — N185 Chronic kidney disease, stage 5: Secondary | ICD-10-CM | POA: Insufficient documentation

## 2021-04-08 NOTE — Assessment & Plan Note (Signed)
BMP today. Dr. Royce Macadamia visit 03/11/21, restarted lasix 40 mg daily and KCl 10 mEq daily.

## 2021-04-08 NOTE — Assessment & Plan Note (Signed)
Uncontrolled today at 164/84, being comanaged by nephrologist Dr. Royce Macadamia who resumed Lasix 40 mg daily on 02/20/2021.  Taking 6.25 mg carved 2 po bid until gone then 12.5 bid.  Continues on amlodipine 10 mg daily, hydralazine 25 mg 3 times daily, losartan 50 mg daily.  CKD 4 contributes to challenging management.  No changes today.

## 2021-04-08 NOTE — Assessment & Plan Note (Addendum)
A1C 6.8 well controlled on jardiance 10 mg daily, though at her stage of CKD this is no longer a safe long term option.  Discontinue and monitor.

## 2021-04-14 ENCOUNTER — Other Ambulatory Visit: Payer: Self-pay

## 2021-04-14 DIAGNOSIS — I10 Essential (primary) hypertension: Secondary | ICD-10-CM

## 2021-04-14 MED ORDER — AMLODIPINE BESYLATE 10 MG PO TABS: 10.0000 mg | ORAL_TABLET | Freq: Every day | ORAL | 3 refills | Status: DC

## 2021-04-17 DIAGNOSIS — E785 Hyperlipidemia, unspecified: Secondary | ICD-10-CM | POA: Diagnosis not present

## 2021-04-17 DIAGNOSIS — N189 Chronic kidney disease, unspecified: Secondary | ICD-10-CM | POA: Diagnosis not present

## 2021-04-17 DIAGNOSIS — K08409 Partial loss of teeth, unspecified cause, unspecified class: Secondary | ICD-10-CM | POA: Diagnosis not present

## 2021-04-17 DIAGNOSIS — K5900 Constipation, unspecified: Secondary | ICD-10-CM | POA: Diagnosis not present

## 2021-04-17 DIAGNOSIS — E876 Hypokalemia: Secondary | ICD-10-CM | POA: Diagnosis not present

## 2021-04-17 DIAGNOSIS — E1122 Type 2 diabetes mellitus with diabetic chronic kidney disease: Secondary | ICD-10-CM | POA: Diagnosis not present

## 2021-04-17 DIAGNOSIS — E1151 Type 2 diabetes mellitus with diabetic peripheral angiopathy without gangrene: Secondary | ICD-10-CM | POA: Diagnosis not present

## 2021-04-17 DIAGNOSIS — E669 Obesity, unspecified: Secondary | ICD-10-CM | POA: Diagnosis not present

## 2021-04-17 DIAGNOSIS — I129 Hypertensive chronic kidney disease with stage 1 through stage 4 chronic kidney disease, or unspecified chronic kidney disease: Secondary | ICD-10-CM | POA: Diagnosis not present

## 2021-04-18 ENCOUNTER — Other Ambulatory Visit: Payer: Self-pay

## 2021-04-18 DIAGNOSIS — E119 Type 2 diabetes mellitus without complications: Secondary | ICD-10-CM

## 2021-04-18 MED ORDER — ACCU-CHEK FASTCLIX LANCETS MISC: 6 refills | Status: DC

## 2021-05-09 ENCOUNTER — Other Ambulatory Visit: Payer: Self-pay

## 2021-05-09 ENCOUNTER — Encounter: Payer: Self-pay | Admitting: Podiatry

## 2021-05-09 ENCOUNTER — Ambulatory Visit: Payer: Medicare PPO | Admitting: Podiatry

## 2021-05-09 DIAGNOSIS — N1832 Chronic kidney disease, stage 3b: Secondary | ICD-10-CM | POA: Diagnosis not present

## 2021-05-09 DIAGNOSIS — M79675 Pain in left toe(s): Secondary | ICD-10-CM

## 2021-05-09 DIAGNOSIS — B351 Tinea unguium: Secondary | ICD-10-CM

## 2021-05-09 DIAGNOSIS — M79674 Pain in right toe(s): Secondary | ICD-10-CM

## 2021-05-09 DIAGNOSIS — E119 Type 2 diabetes mellitus without complications: Secondary | ICD-10-CM

## 2021-05-09 NOTE — Progress Notes (Signed)
This patient returns to my office for at risk foot care.  This patient requires this care by a professional since this patient will be at risk due to having type 2 diabetes and CKD.  This patient is unable to cut nails herself since the patient cannot reach her nails.These nails are painful walking and wearing shoes.  This patient presents for at risk foot care today.  General Appearance  Alert, conversant and in no acute stress.  Vascular  Dorsalis pedis are  weakly palpable  bilaterally.  Posterior tibial pulses are absent.Capillary return is within normal limits  bilaterally. Temperature is within normal limits  bilaterally.  Neurologic  Senn-Weinstein monofilament wire test within normal limits  bilaterally. Muscle power within normal limits bilaterally.  Nails Thick disfigured discolored nails with subungual debris  from hallux to fifth toes bilaterally. No evidence of bacterial infection or drainage bilaterally.  Orthopedic  No limitations of motion  feet .  No crepitus or effusions noted.  No bony pathology or digital deformities noted. Hammer toe second  B/L.  Skin  normotropic skin with no porokeratosis noted bilaterally.  No signs of infections or ulcers noted.     Onychomycosis  Pain in right toes  Pain in left toes  Consent was obtained for treatment procedures.   Mechanical debridement of nails 1-5  bilaterally performed with a nail nipper.  Filed with dremel without incident.    Return office visit     3 months                Told patient to return for periodic foot care and evaluation due to potential at risk complications.   Gardiner Barefoot DPM

## 2021-06-19 DIAGNOSIS — N184 Chronic kidney disease, stage 4 (severe): Secondary | ICD-10-CM | POA: Diagnosis not present

## 2021-06-19 DIAGNOSIS — I129 Hypertensive chronic kidney disease with stage 1 through stage 4 chronic kidney disease, or unspecified chronic kidney disease: Secondary | ICD-10-CM | POA: Diagnosis not present

## 2021-06-19 DIAGNOSIS — E876 Hypokalemia: Secondary | ICD-10-CM | POA: Diagnosis not present

## 2021-06-19 DIAGNOSIS — R809 Proteinuria, unspecified: Secondary | ICD-10-CM | POA: Diagnosis not present

## 2021-06-19 DIAGNOSIS — E1122 Type 2 diabetes mellitus with diabetic chronic kidney disease: Secondary | ICD-10-CM | POA: Diagnosis not present

## 2021-06-19 DIAGNOSIS — Q6102 Congenital multiple renal cysts: Secondary | ICD-10-CM | POA: Diagnosis not present

## 2021-07-02 DIAGNOSIS — H2511 Age-related nuclear cataract, right eye: Secondary | ICD-10-CM | POA: Diagnosis not present

## 2021-07-02 NOTE — Progress Notes (Signed)
Gina Bailey is here for routine follow-up of chronic conditions notably HTN and stage IIIb CKD, currently co-managed with nephrologist Dr. Royce Bailey.  My last visits with pt were on 01/23/21 and 02/10/2021, then 03/26/2021.  She is not sure if she had a visit with  Dr. Royce Bailey since our last OV.  Jardiance stopped 47M ago due to CKD; recheck A1C and BMP today.  LLE has been swelling more than usual, she thinks maybe overuse witrh raking; no knthere has been recurrent sweling which goes down with elevation, recurs when dependent, is not uncomfortable.  She feels she may have pulled a muscle in the proximal lateral lower leg.  Has been raking 15-16 commercial bags leaves!   Sees the podiatrist soon. Eye Dr. Katy Bailey 07/04/21 for 2nd cataract surgery - first went ok "but I talked during the surgery" which I wasn't supposed to do. Improved vision.  Plans to see a new great grandson for Christmas weekend!    She has brought all of her meds for review.    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   Hypernatremia 08/20/2020   Lactose intolerance in adult 09/15/2017   Preventative health care 09/04/2010   T2DM (type 2 diabetes mellitus) ([place]) 04/18/2009   Hyperlipidemia associated with type 2 diabetes mellitus ([place]) 06/24/2007   Hypertension 06/04/2006    Current Outpatient Medications:    Accu-Chek FastClix Lancets MISC, USE ONCE DAILY AS DIRECTED TO MONITOR BLOOD SUGAR, Disp: 102 each, Rfl: 6   amLODipine (NORVASC) 10 MG tablet, Take 1 tablet (10 mg total) by mouth daily., Disp: 90 tablet, Rfl: 3   Blood Glucose Monitoring Suppl (ACCU-CHEK NANO SMARTVIEW) w/Device KIT, 1 each by Does not apply route 1 day or 1 dose. Uses to check blood  sugar once daily in the morning., Disp: 3 kit, Rfl: 3   Calcium Carbonate-Vitamin D 600-400 MG-UNIT tablet, Take 1 tablet by mouth 2 (two) times daily., Disp: 60 tablet, Rfl: 5   carvedilol (COREG) 6.25 MG tablet, Take 1 tablet (6.25 mg total) by mouth 2 (two) times daily., Disp: 60 tablet, Rfl: 3   fexofenadine (ALLEGRA ALLERGY) 180 MG tablet, Take 180 mg by mouth daily., Disp: , Rfl:    glucose blood (ACCU-CHEK GUIDE) test strip, Check blood sugar 7 times a week. diag code E11.9. Non insulin dependent, Disp: 100 each, Rfl: 3   hydrALAZINE (APRESOLINE) 25 MG tablet, Take 1 tablet (25 mg total) by mouth 3 (three) times daily., Disp: 120 tablet, Rfl: 11   losartan (COZAAR) 50 MG tablet, TAKE 2 TABLETS(100 MG) BY MOUTH DAILY, Disp: 90 tablet, Rfl: 3   potassium chloride (KLOR-CON) 10 MEQ tablet, Take 10 mEq by mouth daily., Disp: , Rfl:    Prednisol Ace-Moxiflox-Bromfen 1-0.5-0.075 % SUSP, Place 1 drop into the left eye 4 (four) times daily., Disp: , Rfl:    rosuvastatin (CRESTOR) 20 MG tablet, Take 1 tablet (20 mg total) by mouth daily., Disp: 90 tablet, Rfl: 3  BP (!) 148/71 (BP Location: Right Arm, Patient Position: Sitting, Cuff Size: Small)    Pulse 87    Temp 98.4 F (36.9 C) (Oral)    Ht '5\' 5"'  (1.651 m)    Wt 184 lb (83.5  kg)    LMP 10/22/1978    SpO2 100%    BMI 30.62 kg/m   Physical exam: Smartly dressed, always looks nice at her visits.  Breathing easily, no lung rales.  Heart RRR.  Legs 2+ edema L, 1+ on R, no calf tenderness, no knee swelling or tenderness, full ROM of knee and ankle.    Assessment and plan (see also problem based documentation):  Gina Bailey medical conditions are stable, and she has been very physically active in the yard/garden which gives her pleasure.  No changes in treatment regimen today.  16M f/u.

## 2021-07-03 ENCOUNTER — Ambulatory Visit: Payer: Medicare PPO | Admitting: Internal Medicine

## 2021-07-03 ENCOUNTER — Encounter: Payer: Self-pay | Admitting: Internal Medicine

## 2021-07-03 VITALS — BP 148/71 | HR 87 | Temp 98.4°F | Ht 65.0 in | Wt 184.0 lb

## 2021-07-03 DIAGNOSIS — I129 Hypertensive chronic kidney disease with stage 1 through stage 4 chronic kidney disease, or unspecified chronic kidney disease: Secondary | ICD-10-CM | POA: Diagnosis not present

## 2021-07-03 DIAGNOSIS — E1122 Type 2 diabetes mellitus with diabetic chronic kidney disease: Secondary | ICD-10-CM

## 2021-07-03 DIAGNOSIS — N184 Chronic kidney disease, stage 4 (severe): Secondary | ICD-10-CM | POA: Diagnosis not present

## 2021-07-03 DIAGNOSIS — E119 Type 2 diabetes mellitus without complications: Secondary | ICD-10-CM

## 2021-07-03 DIAGNOSIS — I10 Essential (primary) hypertension: Secondary | ICD-10-CM

## 2021-07-03 DIAGNOSIS — H269 Unspecified cataract: Secondary | ICD-10-CM | POA: Diagnosis not present

## 2021-07-03 LAB — POCT GLYCOSYLATED HEMOGLOBIN (HGB A1C): Hemoglobin A1C: 6.5 % — AB (ref 4.0–5.6)

## 2021-07-03 LAB — GLUCOSE, CAPILLARY: Glucose-Capillary: 135 mg/dL — ABNORMAL HIGH (ref 70–99)

## 2021-07-03 NOTE — Patient Instructions (Signed)
Gina Bailey, It was great to catch up with you today, and to hear about your new great grandson!  Have fun with him at Christmas.  We're checking your diabetes and kidney tests today and I'll call with your results. Take care and stay well, and Merry Christmas! Dr. Jimmye Norman

## 2021-07-04 DIAGNOSIS — H25811 Combined forms of age-related cataract, right eye: Secondary | ICD-10-CM | POA: Diagnosis not present

## 2021-07-04 LAB — BMP8+ANION GAP
Anion Gap: 17 mmol/L (ref 10.0–18.0)
BUN/Creatinine Ratio: 12 (ref 12–28)
BUN: 26 mg/dL (ref 8–27)
CO2: 27 mmol/L (ref 20–29)
Calcium: 9.7 mg/dL (ref 8.7–10.3)
Chloride: 100 mmol/L (ref 96–106)
Creatinine, Ser: 2.26 mg/dL — ABNORMAL HIGH (ref 0.57–1.00)
Glucose: 123 mg/dL — ABNORMAL HIGH (ref 70–99)
Potassium: 4.2 mmol/L (ref 3.5–5.2)
Sodium: 144 mmol/L (ref 134–144)
eGFR: 22 mL/min/{1.73_m2} — ABNORMAL LOW (ref >59)

## 2021-08-05 DIAGNOSIS — H269 Unspecified cataract: Secondary | ICD-10-CM | POA: Insufficient documentation

## 2021-08-05 NOTE — Assessment & Plan Note (Signed)
On no medications, Jardiance stopped due to renal impairment; A1c 6.5.  Increase interval of testing to 6-38M.

## 2021-08-05 NOTE — Assessment & Plan Note (Signed)
Recheck BMP today.  

## 2021-08-05 NOTE — Assessment & Plan Note (Signed)
148/71, improved though not at goal on 4 drug regimen.  Dr. Royce Macadamia managing primarily. No changes.

## 2021-08-12 ENCOUNTER — Ambulatory Visit: Payer: Medicare PPO | Admitting: Podiatry

## 2021-08-27 ENCOUNTER — Ambulatory Visit: Payer: Medicare PPO | Admitting: Podiatry

## 2021-08-27 DIAGNOSIS — K5730 Diverticulosis of large intestine without perforation or abscess without bleeding: Secondary | ICD-10-CM | POA: Insufficient documentation

## 2021-08-27 DIAGNOSIS — R634 Abnormal weight loss: Secondary | ICD-10-CM | POA: Insufficient documentation

## 2021-09-05 ENCOUNTER — Other Ambulatory Visit: Payer: Self-pay

## 2021-09-05 ENCOUNTER — Ambulatory Visit: Payer: Medicare PPO | Admitting: Podiatry

## 2021-09-05 ENCOUNTER — Encounter: Payer: Self-pay | Admitting: Podiatry

## 2021-09-05 DIAGNOSIS — E119 Type 2 diabetes mellitus without complications: Secondary | ICD-10-CM

## 2021-09-05 DIAGNOSIS — M79675 Pain in left toe(s): Secondary | ICD-10-CM

## 2021-09-05 DIAGNOSIS — B351 Tinea unguium: Secondary | ICD-10-CM

## 2021-09-05 DIAGNOSIS — N1832 Chronic kidney disease, stage 3b: Secondary | ICD-10-CM | POA: Diagnosis not present

## 2021-09-05 DIAGNOSIS — M79674 Pain in right toe(s): Secondary | ICD-10-CM | POA: Diagnosis not present

## 2021-09-05 NOTE — Progress Notes (Signed)
This patient returns to my office for at risk foot care.  This patient requires this care by a professional since this patient will be at risk due to having type 2 diabetes and CKD.  This patient is unable to cut nails herself since the patient cannot reach her nails.These nails are painful walking and wearing shoes.  This patient presents for at risk foot care today.  General Appearance  Alert, conversant and in no acute stress.  Vascular  Dorsalis pedis are  weakly palpable  bilaterally.  Posterior tibial pulses are absent.Capillary return is within normal limits  bilaterally. Temperature is within normal limits  bilaterally.  Neurologic  Senn-Weinstein monofilament wire test within normal limits  bilaterally. Muscle power within normal limits bilaterally.  Nails Thick disfigured discolored nails with subungual debris  from hallux to fifth toes bilaterally. No evidence of bacterial infection or drainage bilaterally.  Orthopedic  No limitations of motion  feet .  No crepitus or effusions noted.  No bony pathology or digital deformities noted. Hammer toe second  B/L.  HAV  B/L.  Skin  normotropic skin with no porokeratosis noted bilaterally.  No signs of infections or ulcers noted.     Onychomycosis  Pain in right toes  Pain in left toes  Consent was obtained for treatment procedures.   Mechanical debridement of nails 1-5  bilaterally performed with a nail nipper.  Filed with dremel without incident.    Return office visit     3 months                Told patient to return for periodic foot care and evaluation due to potential at risk complications.   Gardiner Barefoot DPM

## 2021-09-08 DIAGNOSIS — N184 Chronic kidney disease, stage 4 (severe): Secondary | ICD-10-CM | POA: Diagnosis not present

## 2021-09-19 DIAGNOSIS — R809 Proteinuria, unspecified: Secondary | ICD-10-CM | POA: Diagnosis not present

## 2021-09-19 DIAGNOSIS — Q6102 Congenital multiple renal cysts: Secondary | ICD-10-CM | POA: Diagnosis not present

## 2021-09-19 DIAGNOSIS — I129 Hypertensive chronic kidney disease with stage 1 through stage 4 chronic kidney disease, or unspecified chronic kidney disease: Secondary | ICD-10-CM | POA: Diagnosis not present

## 2021-09-19 DIAGNOSIS — N2581 Secondary hyperparathyroidism of renal origin: Secondary | ICD-10-CM | POA: Diagnosis not present

## 2021-09-19 DIAGNOSIS — E1122 Type 2 diabetes mellitus with diabetic chronic kidney disease: Secondary | ICD-10-CM | POA: Diagnosis not present

## 2021-09-19 DIAGNOSIS — E876 Hypokalemia: Secondary | ICD-10-CM | POA: Diagnosis not present

## 2021-09-19 DIAGNOSIS — N184 Chronic kidney disease, stage 4 (severe): Secondary | ICD-10-CM | POA: Diagnosis not present

## 2021-10-16 ENCOUNTER — Encounter: Payer: Medicare PPO | Admitting: Internal Medicine

## 2021-12-03 ENCOUNTER — Encounter: Payer: Self-pay | Admitting: Podiatry

## 2021-12-03 ENCOUNTER — Ambulatory Visit: Payer: Medicare PPO | Admitting: Podiatry

## 2021-12-03 DIAGNOSIS — M79674 Pain in right toe(s): Secondary | ICD-10-CM

## 2021-12-03 DIAGNOSIS — E119 Type 2 diabetes mellitus without complications: Secondary | ICD-10-CM

## 2021-12-03 DIAGNOSIS — M79675 Pain in left toe(s): Secondary | ICD-10-CM

## 2021-12-03 DIAGNOSIS — N1832 Chronic kidney disease, stage 3b: Secondary | ICD-10-CM | POA: Diagnosis not present

## 2021-12-03 DIAGNOSIS — B351 Tinea unguium: Secondary | ICD-10-CM

## 2021-12-03 NOTE — Progress Notes (Signed)
This patient returns to my office for at risk foot care.  This patient requires this care by a professional since this patient will be at risk due to having type 2 diabetes and CKD.  This patient is unable to cut nails herself since the patient cannot reach her nails.These nails are painful walking and wearing shoes.  This patient presents for at risk foot care today. ? ?General Appearance  Alert, conversant and in no acute stress. ? ?Vascular  Dorsalis pedis are  weakly palpable  bilaterally.  Posterior tibial pulses are absent.Capillary return is within normal limits  bilaterally. Temperature is within normal limits  bilaterally. ? ?Neurologic  Senn-Weinstein monofilament wire test within normal limits  bilaterally. Muscle power within normal limits bilaterally. ? ?Nails Thick disfigured discolored nails with subungual debris  from hallux to fifth toes bilaterally. No evidence of bacterial infection or drainage bilaterally. ? ?Orthopedic  No limitations of motion  feet .  No crepitus or effusions noted.  No bony pathology or digital deformities noted. Hammer toe second  B/L.  HAV  B/L. ? ?Skin  normotropic skin with no porokeratosis noted bilaterally.  No signs of infections or ulcers noted.    ? ?Onychomycosis  Pain in right toes  Pain in left toes ? ?Consent was obtained for treatment procedures.   Mechanical debridement of nails 1-5  bilaterally performed with a nail nipper.  Filed with dremel without incident.  ? ? ?Return office visit     3 months                Told patient to return for periodic foot care and evaluation due to potential at risk complications. ? ? ?Gardiner Barefoot DPM   ?

## 2021-12-11 ENCOUNTER — Encounter: Payer: Medicare PPO | Admitting: Internal Medicine

## 2021-12-11 ENCOUNTER — Telehealth: Payer: Self-pay

## 2021-12-11 ENCOUNTER — Encounter: Payer: Self-pay | Admitting: Internal Medicine

## 2021-12-11 NOTE — Telephone Encounter (Signed)
I left a voice mail message because she did not come in for her appointment today. I asked the patient to call us back to reschedule.I was checking on this patient because she is always early for her appointment [place], [place] C5/25/202310:53 AM

## 2022-01-03 ENCOUNTER — Other Ambulatory Visit: Payer: Self-pay | Admitting: Student

## 2022-01-03 DIAGNOSIS — I10 Essential (primary) hypertension: Secondary | ICD-10-CM

## 2022-01-05 NOTE — Telephone Encounter (Signed)
Has missed appointments.  Last appointment was 07/03/2022.  No future appointments scheduled.

## 2022-01-05 NOTE — Telephone Encounter (Signed)
Can we please call Ms. Buzan to schedule a follow-up appointment with Dr. Jimmye Norman next available? Thank you!

## 2022-02-11 DIAGNOSIS — E669 Obesity, unspecified: Secondary | ICD-10-CM | POA: Diagnosis not present

## 2022-02-11 DIAGNOSIS — I129 Hypertensive chronic kidney disease with stage 1 through stage 4 chronic kidney disease, or unspecified chronic kidney disease: Secondary | ICD-10-CM | POA: Diagnosis not present

## 2022-02-11 DIAGNOSIS — R32 Unspecified urinary incontinence: Secondary | ICD-10-CM | POA: Diagnosis not present

## 2022-02-11 DIAGNOSIS — N184 Chronic kidney disease, stage 4 (severe): Secondary | ICD-10-CM | POA: Diagnosis not present

## 2022-02-11 DIAGNOSIS — G3184 Mild cognitive impairment, so stated: Secondary | ICD-10-CM | POA: Diagnosis not present

## 2022-02-11 DIAGNOSIS — E785 Hyperlipidemia, unspecified: Secondary | ICD-10-CM | POA: Diagnosis not present

## 2022-02-11 DIAGNOSIS — Z6830 Body mass index (BMI) 30.0-30.9, adult: Secondary | ICD-10-CM | POA: Diagnosis not present

## 2022-02-11 DIAGNOSIS — J309 Allergic rhinitis, unspecified: Secondary | ICD-10-CM | POA: Diagnosis not present

## 2022-02-11 DIAGNOSIS — K5900 Constipation, unspecified: Secondary | ICD-10-CM | POA: Diagnosis not present

## 2022-02-18 IMAGING — MG MM DIGITAL SCREENING BILAT W/ TOMO AND CAD
6 of 12 series · 6 of 36 positions shown · non-contrast
Comparison: Previous exam(s).

CLINICAL DATA: Screening.

EXAM:
DIGITAL SCREENING BILATERAL MAMMOGRAM WITH TOMOSYNTHESIS AND CAD
TECHNIQUE: Bilateral screening digital craniocaudal and mediolateral oblique
mammograms were obtained. Bilateral screening digital breast
tomosynthesis was performed. The images were evaluated with
computer-aided detection.

[L MLO synth-2D (1 of 2)]
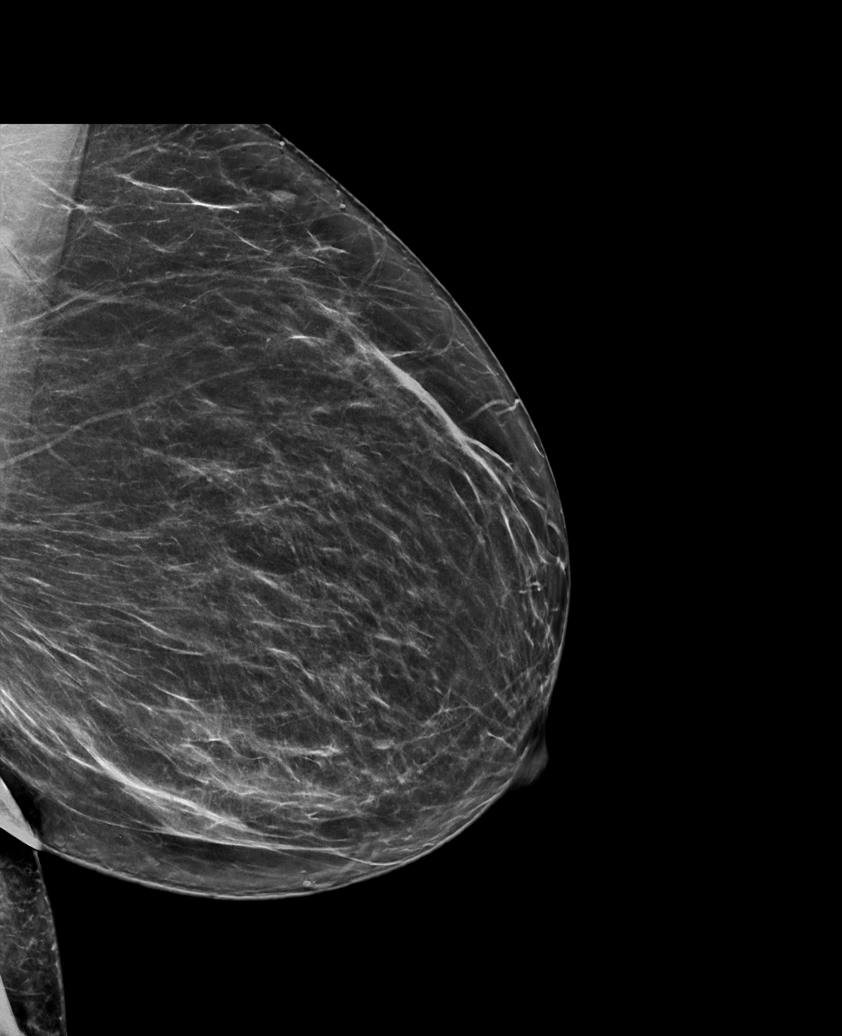

[L MLO synth-2D (2 of 2)]
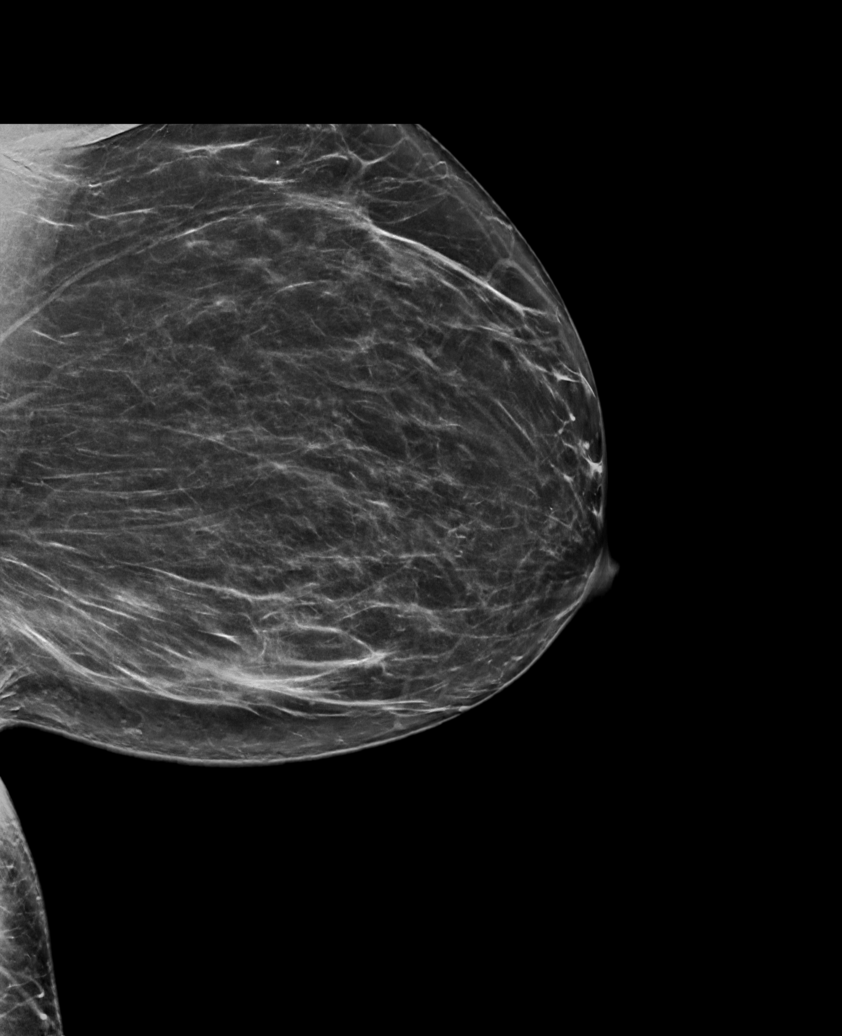

[L CC synth-2D]
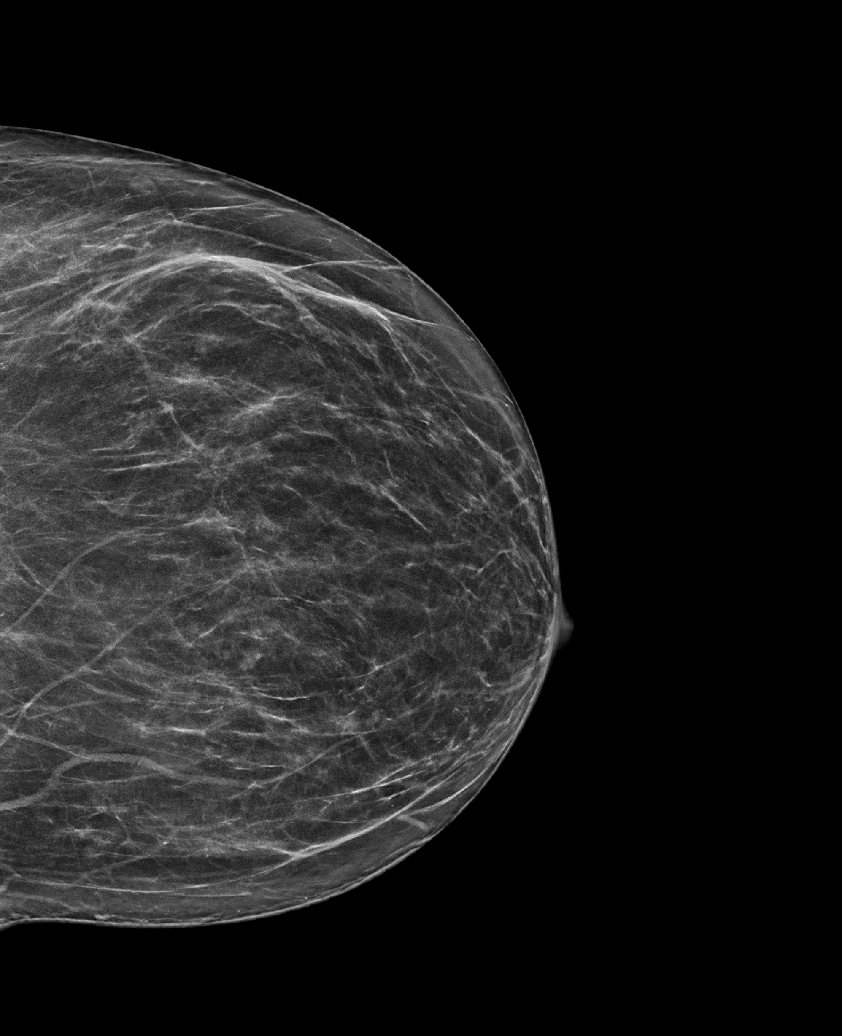

[R MLO synth-2D (1 of 2)]
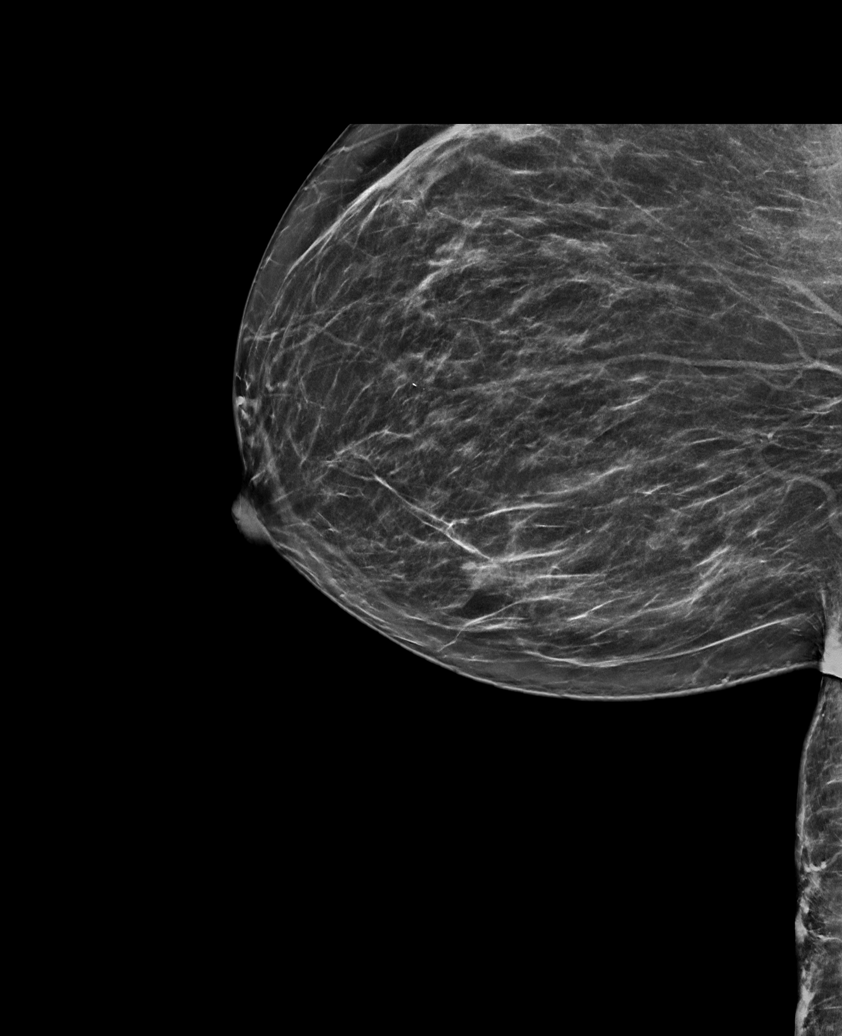

[R CC synth-2D]
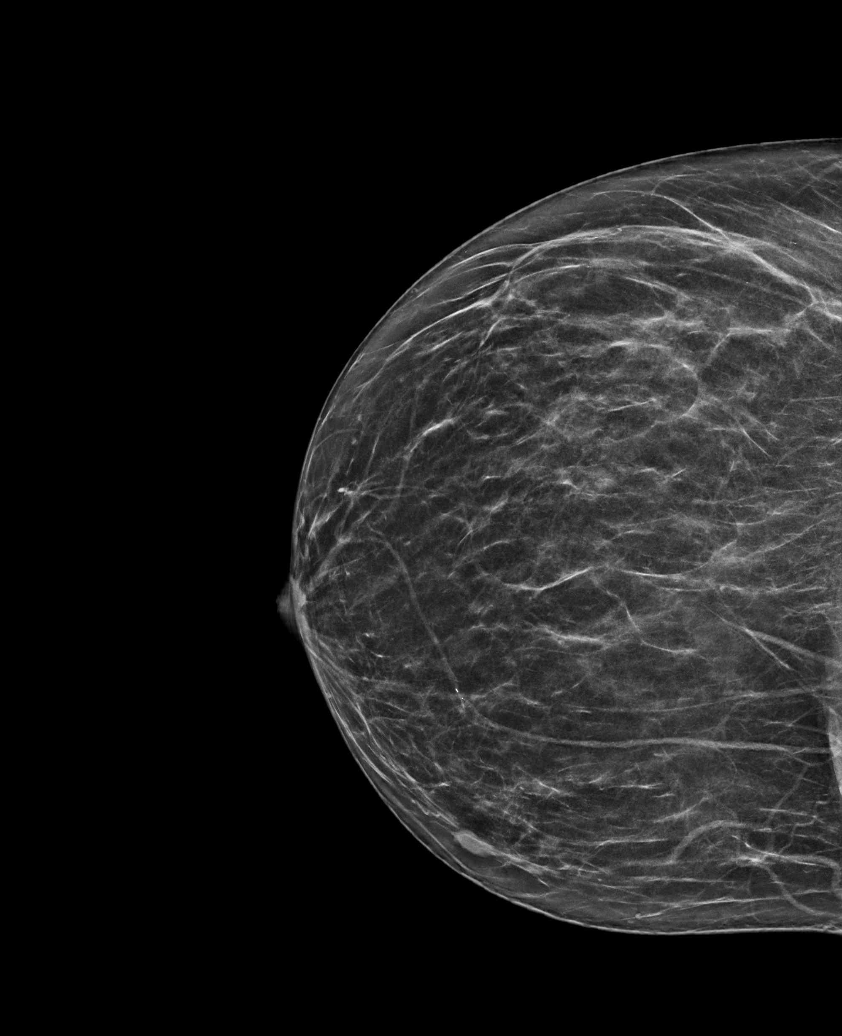

[R MLO synth-2D (2 of 2)]
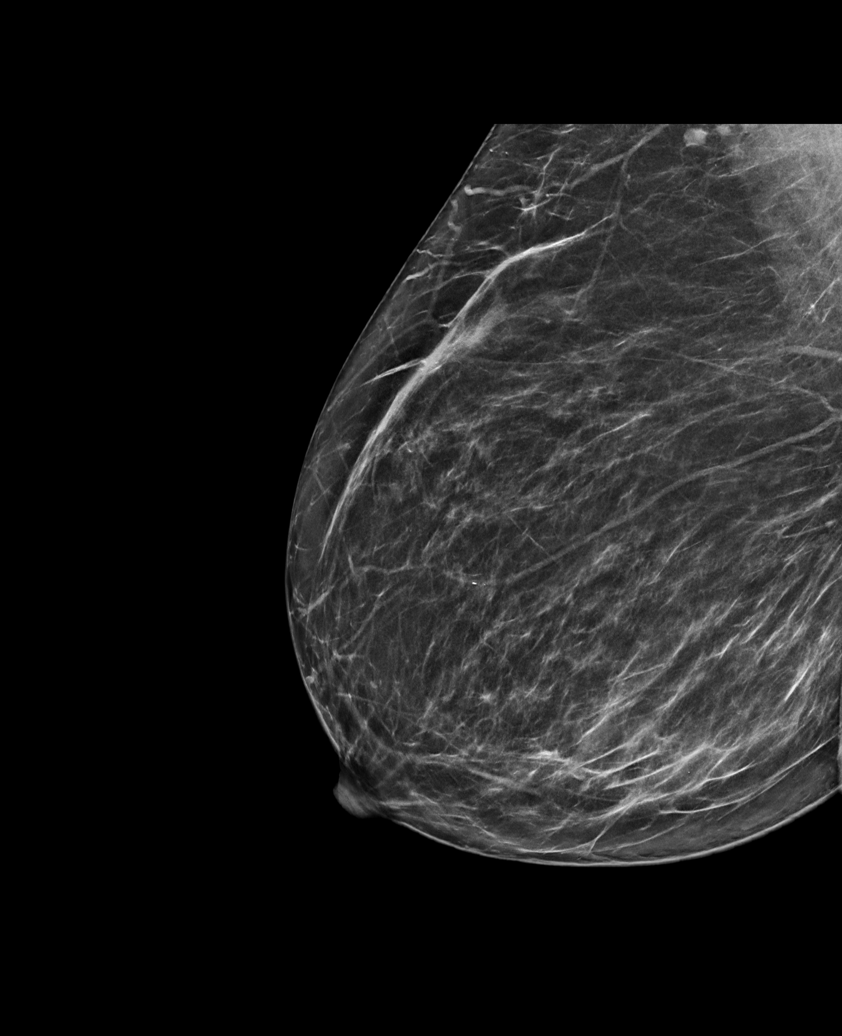

[6 of 36 positions shown; findings below may reference images not displayed]

ACR Breast Density Category b: There are scattered areas of
fibroglandular density.
FINDINGS: There are no findings suspicious for malignancy. The images were
evaluated with computer-aided detection.
IMPRESSION: No mammographic evidence of malignancy. A result letter of this
screening mammogram will be mailed directly to the patient.

RECOMMENDATION:
Screening mammogram in one year. (Code:[id number])

BI-RADS CATEGORY  1: Negative.

## 2022-02-20 ENCOUNTER — Other Ambulatory Visit: Payer: Self-pay | Admitting: *Deleted

## 2022-02-20 NOTE — Patient Outreach (Signed)
  Care Coordination   Initial Visit Note   02/20/2022 Name: XIA STOHR MRN: [id number] DOB: 1945-08-01  TOULA MIYASAKI is a 76 y.o. year old female who sees Jimmye Norman, Elaina Pattee, MD for primary care. I spoke with  [name] by phone today  What matters to the patients health and wellness today?  Improving her diabetes and hypertension to reduce the amount of medications she takes.    Goals Addressed               This Visit's Progress     Patient Stated     Patient Stated (pt-stated)        Patient explains she has difficulty swallowing pills; wants to improve her diabetes and hypertension to decrease the amount of medications she currently takes.        SDOH assessments and interventions completed:  Yes     Care Coordination Interventions Activated:  No  Care Coordination Interventions:  No, not indicated   Follow up plan: Follow up call scheduled for 03/03/22 '@1330'$     Encounter Outcome:  Pt. Visit Completed   Emelia Loron RN, BSN [facility] [phone] [email or web address]

## 2022-02-25 DIAGNOSIS — N2581 Secondary hyperparathyroidism of renal origin: Secondary | ICD-10-CM | POA: Diagnosis not present

## 2022-02-25 DIAGNOSIS — E876 Hypokalemia: Secondary | ICD-10-CM | POA: Diagnosis not present

## 2022-02-25 DIAGNOSIS — R809 Proteinuria, unspecified: Secondary | ICD-10-CM | POA: Diagnosis not present

## 2022-02-25 DIAGNOSIS — E1122 Type 2 diabetes mellitus with diabetic chronic kidney disease: Secondary | ICD-10-CM | POA: Diagnosis not present

## 2022-02-25 DIAGNOSIS — Q6102 Congenital multiple renal cysts: Secondary | ICD-10-CM | POA: Diagnosis not present

## 2022-02-25 DIAGNOSIS — I129 Hypertensive chronic kidney disease with stage 1 through stage 4 chronic kidney disease, or unspecified chronic kidney disease: Secondary | ICD-10-CM | POA: Diagnosis not present

## 2022-02-25 DIAGNOSIS — N184 Chronic kidney disease, stage 4 (severe): Secondary | ICD-10-CM | POA: Diagnosis not present

## 2022-02-26 ENCOUNTER — Encounter: Payer: Medicare PPO | Admitting: Internal Medicine

## 2022-03-02 ENCOUNTER — Ambulatory Visit (HOSPITAL_COMMUNITY)
Admission: RE | Admit: 2022-03-02 | Discharge: 2022-03-02 | Disposition: A | Discharge status: Home / Self Care | Payer: Medicare PPO | Source: Ambulatory Visit | Attending: Surgery | Admitting: Surgery

## 2022-03-02 ENCOUNTER — Other Ambulatory Visit (HOSPITAL_COMMUNITY): Payer: Medicare PPO

## 2022-03-02 ENCOUNTER — Encounter: Payer: Medicare PPO | Admitting: Surgery

## 2022-03-02 ENCOUNTER — Other Ambulatory Visit: Payer: Self-pay | Admitting: *Deleted

## 2022-03-02 ENCOUNTER — Ambulatory Visit (INDEPENDENT_AMBULATORY_CARE_PROVIDER_SITE_OTHER)
Admission: RE | Admit: 2022-03-02 | Discharge: 2022-03-02 | Disposition: A | Discharge status: Home / Self Care | Payer: Medicare PPO | Source: Ambulatory Visit | Attending: Surgery | Admitting: Surgery

## 2022-03-02 ENCOUNTER — Other Ambulatory Visit (HOSPITAL_COMMUNITY): Payer: Self-pay | Admitting: *Deleted

## 2022-03-02 ENCOUNTER — Ambulatory Visit (HOSPITAL_COMMUNITY): Payer: Medicare PPO

## 2022-03-02 DIAGNOSIS — E1122 Type 2 diabetes mellitus with diabetic chronic kidney disease: Secondary | ICD-10-CM

## 2022-03-02 DIAGNOSIS — N189 Chronic kidney disease, unspecified: Secondary | ICD-10-CM

## 2022-03-02 DIAGNOSIS — N184 Chronic kidney disease, stage 4 (severe): Secondary | ICD-10-CM

## 2022-03-04 ENCOUNTER — Ambulatory Visit: Payer: Medicare PPO | Admitting: Vascular Surgery

## 2022-03-04 ENCOUNTER — Other Ambulatory Visit: Payer: Self-pay

## 2022-03-04 ENCOUNTER — Encounter: Payer: Self-pay | Admitting: Vascular Surgery

## 2022-03-04 VITALS — BP 162/81 | HR 90 | Temp 98.0°F | Resp 20 | Ht 65.0 in | Wt 170.0 lb

## 2022-03-04 DIAGNOSIS — N184 Chronic kidney disease, stage 4 (severe): Secondary | ICD-10-CM

## 2022-03-04 NOTE — Progress Notes (Signed)
Patient ID: Gina Bailey, female   DOB: 06-15-1946, 76 y.o.   MRN: [id number]  Reason for Consult: New Patient (Initial Visit)   Referred by Claudia Desanctis, MD  Subjective:     HPI:  Gina Bailey is a 76 y.o. female followed by Dr. Lavone Neri kidney.  She has never been on dialysis before it is approaching now.  She has diabetes hypertension and hyperlipidemia.  She is right-hand dominant.  Denies any history of chest or breast or upper extremity surgery and does not have any history of pacemakers reports.  She is accompanied by multiple family members today.  Past Medical History:  Diagnosis Date   Chronic kidney disease    Diabetes mellitus    Diet-controlled. Not on antiglycemic medications.   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  Based on the size, patient's age, status as a former smoker, and its discovery in a community setting, her probability of malignancy is 3% (low probability).  Based on this, I recommend follow-up with a CT scan around 05/2014, and if lesion is stable or smaller, no further follow-up is needed.  CT Chest 06/2015: Stable 41m pulmonary nodule. No   Ventral hernia    Incisional ventral hernia. S/P repair by Dr. 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 WHulen Skains (08/2006)   Family History  Problem Relation Age of Onset   Diabetes Other        1st degree reative in 3 of her siblings   Breast cancer Mother        not sure of age   Past Surgical History:  Procedure Laterality Date   APPENDECTOMY     COLON SURGERY  08/2006   Partial right colectomy and anastomosis  with resection of villous adenoma.   HEMORRHOID SURGERY     hemorrhoidectomy   TONSILLECTOMY     TUBAL LIGATION     VENTRAL HERNIA REPAIR  08/2008   By Dr. WHulen Skains    Short Social History:  Social History   Tobacco Use   Smoking status: Former    Packs/day: 0.05    Years: 6.00    Total pack years: 0.30    Types: Cigarettes    Quit date: 09/05/1999    Years since quitting: 22.5   Smokeless tobacco: Never  Substance Use Topics   Alcohol use: No    Alcohol/week: 0.0 standard drinks of alcohol    Allergies  Allergen Reactions   Metformin And Related Other (See Comments)    Could not tolerate metformin due to GI side effects   Pineapple Itching and Swelling   Lisinopril Swelling    Current Outpatient Medications  Medication Sig Dispense Refill   Accu-Chek FastClix Lancets MISC USE ONCE DAILY AS DIRECTED TO MONITOR BLOOD SUGAR 102 each 6   amLODipine (NORVASC) 10 MG tablet Take 1 tablet (10 mg total) by mouth daily. 90 tablet 3   Blood Glucose Monitoring Suppl (ACCU-CHEK NANO SMARTVIEW) w/Device KIT 1 each by Does not apply route 1 day or 1 dose. Uses  to check blood sugar once daily in the morning. 3 kit 3   Calcium Carbonate-Vitamin D 600-400 MG-UNIT tablet Take 1 tablet by mouth 2 (two) times daily. 60 tablet 5   fexofenadine (ALLEGRA ALLERGY) 180 MG tablet Take 180 mg by mouth daily.     furosemide (LASIX) 40 MG tablet Take by mouth.     glucose blood (ACCU-CHEK GUIDE) test strip Check blood sugar 7 times a week. diag code E11.9. Non insulin dependent 100 each 3   hydrALAZINE (APRESOLINE) 25 MG tablet TAKE 1 TABLET(25 MG) BY MOUTH THREE TIMES DAILY 120 tablet 11   JARDIANCE 10 MG TABS tablet Take by mouth.     losartan (COZAAR) 50 MG tablet TAKE 2 TABLETS(100 MG) BY MOUTH DAILY 90 tablet 3   potassium chloride (KLOR-CON) 10 MEQ tablet Take 10 mEq by mouth daily.     Prednisol Ace-Moxiflox-Bromfen 1-0.5-0.075 % SUSP Place 1 drop into the left eye 4 (four) times daily.      rosuvastatin (CRESTOR) 20 MG tablet Take 1 tablet (20 mg total) by mouth daily. 90 tablet 3   carvedilol (COREG) 6.25 MG tablet Take 1 tablet (6.25 mg total) by mouth 2 (two) times daily. 60 tablet 3   No current facility-administered medications for this visit.    Review of Systems  Constitutional:  Constitutional negative. HENT: HENT negative.  Eyes: Eyes negative.  Respiratory: Respiratory negative.  Cardiovascular: Positive for leg swelling.  GI: Gastrointestinal negative.  Musculoskeletal: Musculoskeletal negative.  Skin: Skin negative.  Neurological: Neurological negative. Hematologic: Hematologic/lymphatic negative.  Psychiatric: Psychiatric negative.        Objective:  Objective   Vitals:   03/04/22 1345  BP: (!) 162/81  Pulse: 90  Resp: 20  Temp: 98 F (36.7 C)  SpO2: 95%  Weight: 170 lb (77.1 kg)  Height: _0  (1.651 m)   Body mass index is 28.29 kg/m.  Physical Exam HENT:     Head: Normocephalic.  Eyes:     Pupils: Pupils are equal, round, and reactive to light.  Cardiovascular:     Rate and Rhythm: Normal rate.     Pulses:          Radial pulses are 2+ on the right side and 2+ on the left side.  Pulmonary:     Effort: Pulmonary effort is normal.  Abdominal:     General: Abdomen is flat.     Palpations: Abdomen is soft.  Musculoskeletal:     Right lower leg: Edema present.     Left lower leg: Edema present.  Skin:    General: Skin is warm.     Capillary Refill: Capillary refill takes less than 2 seconds.  Neurological:     General: No focal deficit present.     Mental Status: She is alert.  Psychiatric:        Mood and Affect: Mood normal.        Behavior: Behavior normal.        Thought Content: Thought content normal.        Judgment: Judgment normal.     Data: Right Pre-Dialysis Findings:  +-----------------------+----------+--------------------+---------+--------  +  Location               PSV (cm/s)Intralum. Diam.  (cm)Waveform  Comments  +-----------------------+----------+--------------------+---------+--------  +  Brachial Antecub. fossa78        0.45                triphasic            +-----------------------+----------+--------------------+---------+--------  +  Radial Art at Wrist    61        0.25                triphasic            +-----------------------+----------+--------------------+---------+--------  +  Ulnar Art at Wrist     86        0.16                triphasic            +-----------------------+----------+--------------------+---------+--------  +       Left Pre-Dialysis Findings:  +-----------------------+----------+--------------------+---------+--------  +  Location               PSV (cm/s)Intralum. Diam. (cm)Waveform  Comments  +-----------------------+----------+--------------------+---------+--------  +  Brachial Antecub. fossa89        0.48                triphasic            +-----------------------+----------+--------------------+---------+--------  +  Radial Art at Wrist    78        0.23                triphasic            +-----------------------+----------+--------------------+---------+--------  +  Ulnar Art at Wrist     79        0.16                triphasic            +-----------------------+----------+--------------------+---------+--------  +   Right Cephalic   Diameter (cm)Depth (cm)Findings  +-----------------+-------------+----------+--------+  Shoulder             0.33                         +-----------------+-------------+----------+--------+  Prox upper arm       0.19                         +-----------------+-------------+----------+--------+  Mid upper arm     0.11 / 0.20                     +-----------------+-------------+----------+--------+  Dist upper arm    0.15 / 0.07                     +-----------------+-------------+----------+--------+   Antecubital fossa    0.25                         +-----------------+-------------+----------+--------+  Prox forearm         0.20                         +-----------------+-------------+----------+--------+  Mid forearm          0.16                         +-----------------+-------------+----------+--------+  Dist forearm         0.13                         +-----------------+-------------+----------+--------+  Wrist                0.13                         +-----------------+-------------+----------+--------+   +-----------------+-------------+----------+--------------+  Right Basilic    Diameter (cm)Depth (cm)   Findings     +-----------------+-------------+----------+--------------+  Prox upper arm                          not visualized  +-----------------+-------------+----------+--------------+  Mid upper arm        0.35                               +-----------------+-------------+----------+--------------+  Dist upper arm       0.19                               +-----------------+-------------+----------+--------------+  Antecubital fossa 0.31 / 0.45                           +-----------------+-------------+----------+--------------+  Prox forearm         0.24                               +-----------------+-------------+----------+--------------+   +-----------------+-------------+----------+--------------+  Left Cephalic    Diameter (cm)Depth (cm)   Findings     +-----------------+-------------+----------+--------------+  Shoulder             0.42                               +-----------------+-------------+----------+--------------+  Prox upper arm       0.37                               +-----------------+-------------+----------+--------------+  Mid upper arm        0.39                               +-----------------+-------------+----------+--------------+  Dist upper arm        0.43                               +-----------------+-------------+----------+--------------+  Antecubital fossa    0.25                   joins       +-----------------+-------------+----------+--------------+  Prox forearm         0.17                               +-----------------+-------------+----------+--------------+  Mid forearm          0.18                               +-----------------+-------------+----------+--------------+  Dist forearm                            not visualized  +-----------------+-------------+----------+--------------+  Wrist                                   not visualized  +-----------------+-------------+----------+--------------+   +-----------------+-------------+----------+--------------+  Left Basilic     Diameter (cm)Depth (cm)   Findings     +-----------------+-------------+----------+--------------+  Shoulder                                not visualized  +-----------------+-------------+----------+--------------+  Prox upper arm       0.24                               +-----------------+-------------+----------+--------------+  Mid upper arm        0.22                               +-----------------+-------------+----------+--------------+  Dist upper arm       0.17                               +-----------------+-------------+----------+--------------+  Antecubital fossa    0.19                               +-----------------+-------------+----------+--------------+  Prox forearm         0.16                               +-----------------+-------------+----------+--------------+   Summary:     Measurements above.      Assessment/Plan:    76 year old female history of chronic kidney disease here for evaluation of permanent dialysis access with plans for fistula with weight on the graft.  She appears to have suitable cephalic vein on the left she is right-hand  dominant without previous left upper extremity or chest or breast surgeries.  Plan will be for left arm AV fistula in the near future to avoid need for catheter.  I discussed the alternatives as well as the risk and benefits of the procedure and she demonstrates good understanding in the presence of her family.     Waynetta Sandy MD [facility]

## 2022-03-04 NOTE — H&P (View-Only) (Signed)
 Patient ID: Gina Bailey, female   DOB: 04/21/1946, 76 y.o.   MRN: [id number]  Reason for Consult: New Patient (Initial Visit)   Referred by Foster, Lori C, MD  Subjective:     HPI:  Gina Bailey is a 76 y.o. female followed by Dr. Foster [facility].  She has never been on dialysis before it is approaching now.  She has diabetes hypertension and hyperlipidemia.  She is right-hand dominant.  Denies any history of chest or breast or upper extremity surgery and does not have any history of pacemakers reports.  She is accompanied by multiple family members today.  Past Medical History:  Diagnosis Date   Chronic kidney disease    Diabetes mellitus    Diet-controlled. Not on antiglycemic medications.   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  Based on the size, patient's age, status as a former smoker, and its discovery in a community setting, her probability of malignancy is 3% (low probability).  Based on this, I recommend follow-up with a CT scan around 05/2014, and if lesion is stable or smaller, no further follow-up is needed.  CT Chest 06/2015: Stable 3mm pulmonary nodule. No   Ventral hernia    Incisional ventral hernia. S/P repair by Dr. Wyatt  (08/2008)   Ventricular hypertrophy 05/2006   LVH on ECG (05/2006) - Likely 2/2 HTN.   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 Wyatt. (08/2006)   Family History  Problem Relation Age of Onset   Diabetes Other        1st degree reative in 3 of her siblings   Breast cancer Mother        not sure of age   Past Surgical History:  Procedure Laterality Date   APPENDECTOMY     COLON SURGERY  08/2006   Partial right colectomy and anastomosis  with resection of villous adenoma.   HEMORRHOID SURGERY     hemorrhoidectomy   TONSILLECTOMY     TUBAL LIGATION     VENTRAL HERNIA REPAIR  08/2008   By Dr. Wyatt.    Short Social History:  Social History   Tobacco Use   Smoking status: Former    Packs/day: 0.05    Years: 6.00    Total pack years: 0.30    Types: Cigarettes    Quit date: 09/05/1999    Years since quitting: 22.5   Smokeless tobacco: Never  Substance Use Topics   Alcohol use: No    Alcohol/week: 0.0 standard drinks of alcohol    Allergies  Allergen Reactions   Metformin And Related Other (See Comments)    Could not tolerate metformin due to GI side effects   Pineapple Itching and Swelling   Lisinopril Swelling    Current Outpatient Medications  Medication Sig Dispense Refill   Accu-Chek FastClix Lancets MISC USE ONCE DAILY AS DIRECTED TO MONITOR BLOOD SUGAR 102 each 6   amLODipine (NORVASC) 10 MG tablet Take 1 tablet (10 mg total) by mouth daily. 90 tablet 3   Blood Glucose Monitoring Suppl (ACCU-CHEK NANO SMARTVIEW) w/Device KIT 1 each by Does not apply route 1 day or 1 dose. Uses   to check blood sugar once daily in the morning. 3 kit 3   Calcium Carbonate-Vitamin D 600-400 MG-UNIT tablet Take 1 tablet by mouth 2 (two) times daily. 60 tablet 5   fexofenadine (ALLEGRA ALLERGY) 180 MG tablet Take 180 mg by mouth daily.     furosemide (LASIX) 40 MG tablet Take by mouth.     glucose blood (ACCU-CHEK GUIDE) test strip Check blood sugar 7 times a week. diag code E11.9. Non insulin dependent 100 each 3   hydrALAZINE (APRESOLINE) 25 MG tablet TAKE 1 TABLET(25 MG) BY MOUTH THREE TIMES DAILY 120 tablet 11   JARDIANCE 10 MG TABS tablet Take by mouth.     losartan (COZAAR) 50 MG tablet TAKE 2 TABLETS(100 MG) BY MOUTH DAILY 90 tablet 3   potassium chloride (KLOR-CON) 10 MEQ tablet Take 10 mEq by mouth daily.     Prednisol Ace-Moxiflox-Bromfen 1-0.5-0.075 % SUSP Place 1 drop into the left eye 4 (four) times daily.      rosuvastatin (CRESTOR) 20 MG tablet Take 1 tablet (20 mg total) by mouth daily. 90 tablet 3   carvedilol (COREG) 6.25 MG tablet Take 1 tablet (6.25 mg total) by mouth 2 (two) times daily. 60 tablet 3   No current facility-administered medications for this visit.    Review of Systems  Constitutional:  Constitutional negative. HENT: HENT negative.  Eyes: Eyes negative.  Respiratory: Respiratory negative.  Cardiovascular: Positive for leg swelling.  GI: Gastrointestinal negative.  Musculoskeletal: Musculoskeletal negative.  Skin: Skin negative.  Neurological: Neurological negative. Hematologic: Hematologic/lymphatic negative.  Psychiatric: Psychiatric negative.        Objective:  Objective   Vitals:   03/04/22 1345  BP: (!) 162/81  Pulse: 90  Resp: 20  Temp: 98 F (36.7 C)  SpO2: 95%  Weight: 170 lb (77.1 kg)  Height: 5' 5" (1.651 m)   Body mass index is 28.29 kg/m.  Physical Exam HENT:     Head: Normocephalic.  Eyes:     Pupils: Pupils are equal, round, and reactive to light.  Cardiovascular:     Rate and Rhythm: Normal rate.     Pulses:          Radial pulses are 2+ on the right side and 2+ on the left side.  Pulmonary:     Effort: Pulmonary effort is normal.  Abdominal:     General: Abdomen is flat.     Palpations: Abdomen is soft.  Musculoskeletal:     Right lower leg: Edema present.     Left lower leg: Edema present.  Skin:    General: Skin is warm.     Capillary Refill: Capillary refill takes less than 2 seconds.  Neurological:     General: No focal deficit present.     Mental Status: She is alert.  Psychiatric:        Mood and Affect: Mood normal.        Behavior: Behavior normal.        Thought Content: Thought content normal.        Judgment: Judgment normal.     Data: Right Pre-Dialysis Findings:  +-----------------------+----------+--------------------+---------+--------  +  Location               PSV (cm/s)Intralum. Diam.  (cm)Waveform  Comments  +-----------------------+----------+--------------------+---------+--------  +  Brachial Antecub. fossa78        0.45                triphasic            +-----------------------+----------+--------------------+---------+--------  +    Radial Art at Wrist    61        0.25                triphasic            +-----------------------+----------+--------------------+---------+--------  +  Ulnar Art at Wrist     86        0.16                triphasic            +-----------------------+----------+--------------------+---------+--------  +       Left Pre-Dialysis Findings:  +-----------------------+----------+--------------------+---------+--------  +  Location               PSV (cm/s)Intralum. Diam. (cm)Waveform  Comments  +-----------------------+----------+--------------------+---------+--------  +  Brachial Antecub. fossa89        0.48                triphasic            +-----------------------+----------+--------------------+---------+--------  +  Radial Art at Wrist    78        0.23                triphasic            +-----------------------+----------+--------------------+---------+--------  +  Ulnar Art at Wrist     79        0.16                triphasic            +-----------------------+----------+--------------------+---------+--------  +   Right Cephalic   Diameter (cm)Depth (cm)Findings  +-----------------+-------------+----------+--------+  Shoulder             0.33                         +-----------------+-------------+----------+--------+  Prox upper arm       0.19                         +-----------------+-------------+----------+--------+  Mid upper arm     0.11 / 0.20                     +-----------------+-------------+----------+--------+  Dist upper arm    0.15 / 0.07                     +-----------------+-------------+----------+--------+   Antecubital fossa    0.25                         +-----------------+-------------+----------+--------+  Prox forearm         0.20                         +-----------------+-------------+----------+--------+  Mid forearm          0.16                         +-----------------+-------------+----------+--------+  Dist forearm         0.13                         +-----------------+-------------+----------+--------+  Wrist                0.13                         +-----------------+-------------+----------+--------+   +-----------------+-------------+----------+--------------+    Right Basilic    Diameter (cm)Depth (cm)   Findings     +-----------------+-------------+----------+--------------+  Prox upper arm                          not visualized  +-----------------+-------------+----------+--------------+  Mid upper arm        0.35                               +-----------------+-------------+----------+--------------+  Dist upper arm       0.19                               +-----------------+-------------+----------+--------------+  Antecubital fossa 0.31 / 0.45                           +-----------------+-------------+----------+--------------+  Prox forearm         0.24                               +-----------------+-------------+----------+--------------+   +-----------------+-------------+----------+--------------+  Left Cephalic    Diameter (cm)Depth (cm)   Findings     +-----------------+-------------+----------+--------------+  Shoulder             0.42                               +-----------------+-------------+----------+--------------+  Prox upper arm       0.37                               +-----------------+-------------+----------+--------------+  Mid upper arm        0.39                               +-----------------+-------------+----------+--------------+  Dist upper arm        0.43                               +-----------------+-------------+----------+--------------+  Antecubital fossa    0.25                   joins       +-----------------+-------------+----------+--------------+  Prox forearm         0.17                               +-----------------+-------------+----------+--------------+  Mid forearm          0.18                               +-----------------+-------------+----------+--------------+  Dist forearm                            not visualized  +-----------------+-------------+----------+--------------+  Wrist                                   not visualized  +-----------------+-------------+----------+--------------+   +-----------------+-------------+----------+--------------+    Left Basilic     Diameter (cm)Depth (cm)   Findings     +-----------------+-------------+----------+--------------+  Shoulder                                not visualized  +-----------------+-------------+----------+--------------+  Prox upper arm       0.24                               +-----------------+-------------+----------+--------------+  Mid upper arm        0.22                               +-----------------+-------------+----------+--------------+  Dist upper arm       0.17                               +-----------------+-------------+----------+--------------+  Antecubital fossa    0.19                               +-----------------+-------------+----------+--------------+  Prox forearm         0.16                               +-----------------+-------------+----------+--------------+   Summary:     Measurements above.      Assessment/Plan:    76-year-old female history of chronic kidney disease here for evaluation of permanent dialysis access with plans for fistula with weight on the graft.  She appears to have suitable cephalic vein on the left she is right-hand  dominant without previous left upper extremity or chest or breast surgeries.  Plan will be for left arm AV fistula in the near future to avoid need for catheter.  I discussed the alternatives as well as the risk and benefits of the procedure and she demonstrates good understanding in the presence of her family.     Lexany Belknap Christopher Deniya Craigo MD [facility]   

## 2022-03-06 ENCOUNTER — Ambulatory Visit: Payer: Self-pay

## 2022-03-06 ENCOUNTER — Encounter: Payer: Self-pay | Admitting: Podiatry

## 2022-03-06 ENCOUNTER — Ambulatory Visit (INDEPENDENT_AMBULATORY_CARE_PROVIDER_SITE_OTHER): Payer: Medicare PPO | Admitting: Podiatry

## 2022-03-06 DIAGNOSIS — M79675 Pain in left toe(s): Secondary | ICD-10-CM | POA: Diagnosis not present

## 2022-03-06 DIAGNOSIS — M79674 Pain in right toe(s): Secondary | ICD-10-CM

## 2022-03-06 DIAGNOSIS — N1832 Chronic kidney disease, stage 3b: Secondary | ICD-10-CM

## 2022-03-06 DIAGNOSIS — E119 Type 2 diabetes mellitus without complications: Secondary | ICD-10-CM | POA: Diagnosis not present

## 2022-03-06 DIAGNOSIS — E1140 Type 2 diabetes mellitus with diabetic neuropathy, unspecified: Secondary | ICD-10-CM | POA: Insufficient documentation

## 2022-03-06 DIAGNOSIS — M2010 Hallux valgus (acquired), unspecified foot: Secondary | ICD-10-CM | POA: Insufficient documentation

## 2022-03-06 DIAGNOSIS — B351 Tinea unguium: Secondary | ICD-10-CM

## 2022-03-06 DIAGNOSIS — E1149 Type 2 diabetes mellitus with other diabetic neurological complication: Secondary | ICD-10-CM

## 2022-03-06 NOTE — Progress Notes (Signed)
This patient returns to my office for at risk foot care.  This patient requires this care by a professional since this patient will be at risk due to having type 2 diabetes and CKD.  This patient is unable to cut nails herself since the patient cannot reach her nails.These nails are painful walking and wearing shoes.  This patient presents for at risk foot care today.  General Appearance  Alert, conversant and in no acute stress.  Vascular  Dorsalis pedis are  weakly palpable  bilaterally.  Posterior tibial pulses are absent.Capillary return is within normal limits  bilaterally. Temperature is within normal limits  bilaterally.  Neurologic  Senn-Weinstein monofilament wire test diminished  bilaterally. Muscle power within normal limits bilaterally.  Nails Thick disfigured discolored nails with subungual debris  from hallux to fifth toes bilaterally. No evidence of bacterial infection or drainage bilaterally.  Orthopedic  No limitations of motion  feet .  No crepitus or effusions noted.  No bony pathology or digital deformities noted. Hammer toe second  B/L.  HAV  B/L.  Skin  normotropic skin with no porokeratosis noted bilaterally.  No signs of infections or ulcers noted.     Onychomycosis  Pain in right toes  Pain in left toes  Consent was obtained for treatment procedures.   Mechanical debridement of nails 1-5  bilaterally performed with a nail nipper.  Filed with dremel without incident.    Return office visit     3 months                Told patient to return for periodic foot care and evaluation due to potential at risk complications.   Fong Mccarry DPM   

## 2022-03-07 NOTE — Patient Instructions (Signed)
Visit Information  Thank you for taking time to visit with me today. Please don't hesitate to contact me if I can be of assistance to you.   Following are the goals we discussed today:   Goals Addressed               This Visit's Progress     I was taking a lot of medications. (pt-stated)        Care Coordination Interventions: Reviewed medications with patient and discussed  Active listening / Reflection utilized  Problem Adams reviewed RN CM was referred to discuss medication for swallowing issues with the patient and her daughter Delphine on speaker phone. However, the patient stated she was not experiencing any problems and taking only two pills. We discussed blood pressure, CKD, and Hemodialysis, and I offered to send the information. The family has my contact information if they need to reach me. The family will follow up as needed.      Patient Stated (pt-stated)           The patient verbalized understanding of instructions, Scientist, clinical (histocompatibility and immunogenetics), and care plan provided today    Lazaro Arms RN, BSN, [facility]   Phone: [phone]

## 2022-03-07 NOTE — Patient Outreach (Signed)
  Care Coordination   Initial Visit Note   03/06/22 Name: Gina Bailey MRN: [id number] DOB: 03-28-1946  Gina Bailey is a 76 y.o. year old female who sees Jimmye Norman, Elaina Pattee, MD for primary care. I spoke with  Gina Bailey by phone today and her daughter Gina Bailey.  What matters to the patients health and wellness today?  I had swallowing issues with pills.    Goals Addressed               This Visit's Progress     I was taking a lot of medications. (pt-stated)        Care Coordination Interventions: Reviewed medications with patient and discussed  Active listening / Reflection utilized  Problem [name] strategies reviewed RN CM was referred to discuss medication for swallowing issues with the patient and her daughter Gina Bailey on speaker phone. However, the patient stated she was not experiencing any problems and taking only two pills. We discussed blood pressure, CKD, and Hemodialysis, and I offered to send the information. The family has my contact information if they need to reach me. The family will follow up as needed.      Patient Stated (pt-stated)          SDOH assessments and interventions completed:  Yes  SDOH Interventions Today    Flowsheet Row Most Recent Value  SDOH Interventions   Food Insecurity Interventions Intervention Not Indicated  Housing Interventions Intervention Not Indicated  Transportation Interventions Intervention Not Indicated        Care Coordination Interventions Activated:  Yes  Care Coordination Interventions:  Yes, provided   Follow up plan:  The family will follow as needed    Encounter Outcome:  Pt. Visit Completed {THN Tip this will not be part of the note when signed-REQUIRED REPORT FIELD DO NOT DELETE (Optional):[id number]  Lazaro Arms RN, BSN, [facility]   Phone: [phone]

## 2022-03-09 ENCOUNTER — Other Ambulatory Visit: Payer: Self-pay

## 2022-03-09 ENCOUNTER — Encounter (HOSPITAL_COMMUNITY): Payer: Self-pay | Admitting: Vascular Surgery

## 2022-03-09 NOTE — Anesthesia Preprocedure Evaluation (Signed)
Anesthesia Evaluation  Patient identified by MRN, date of birth, ID band Patient awake    Reviewed: Allergy & Precautions, NPO status , Patient's Chart, lab work & pertinent test results  Airway Mallampati: III  TM Distance: >3 FB Neck ROM: Full    Dental  (+) Dental Advisory Given, Missing, Poor Dentition   Pulmonary former smoker,    Pulmonary exam normal breath sounds clear to auscultation       Cardiovascular hypertension, Pt. on medications + Valvular Problems/Murmurs MR  Rhythm:Regular Rate:Normal + Systolic murmurs    Neuro/Psych negative neurological ROS  negative psych ROS   GI/Hepatic negative GI ROS, Neg liver ROS,   Endo/Other  diabetes, Well Controlled, Type 2  Renal/GU Renal InsufficiencyRenal disease     Musculoskeletal negative musculoskeletal ROS (+)   Abdominal   Peds  Hematology  (+) Blood dyscrasia, anemia ,   Anesthesia Other Findings Day of surgery medications reviewed with the patient.  Reproductive/Obstetrics                           Anesthesia Physical Anesthesia Plan  ASA: 3  Anesthesia Plan: Regional and MAC   Post-op Pain Management: Regional block* and Tylenol PO (pre-op)*   Induction: Intravenous  PONV Risk Score and Plan: 2 and Dexamethasone, Ondansetron and Treatment may vary due to age or medical condition  Airway Management Planned: Natural Airway and Nasal Cannula  Additional Equipment:   Intra-op Plan:   Post-operative Plan:   Informed Consent: I have reviewed the patients History and Physical, chart, labs and discussed the procedure including the risks, benefits and alternatives for the proposed anesthesia with the patient or authorized representative who has indicated his/her understanding and acceptance.     Dental advisory given  Plan Discussed with: CRNA  Anesthesia Plan Comments: (PAT note written 03/09/2022 by Myra Gianotti,  PA-C. )       Anesthesia Quick Evaluation

## 2022-03-09 NOTE — Progress Notes (Signed)
Anesthesia Chart Review:  Case: [id number] Date/Time: 03/10/22 0931   Procedure: LEFT ARM ARTERIOVENOUS (AV) FISTULA CREATION VERSUS GRAFT (Left)   Anesthesia type: Choice   Pre-op diagnosis: Chronic Kidney Disease   Location: [facility] OR ROOM 12 / [facility]   Surgeons: Waynetta Sandy, MD       DISCUSSION: Patient is a 76 year old female scheduled for the above procedure. She is not yet on hemodialysis.   History includes former smoker (quit 09/05/99), HTN, HLD, LVH (moderate concentric LVH on 2022 echo), ventral hernia, DM2, CKD (stage IV), pulmonary nodule (3 mm RLL nodule stable 07/05/15 CT and considered benign), villous adenoma (s/p partial right colectomy 09/09/06), ventral hernia (s/p laparoscopic repair 08/31/08). Mild to moderate MR by 09/20/20 echo.  She is a same-day work-up.  Anesthesia team to evaluate on the day of surgery.  No recent EKG seen on file.   VS: LMP 10/22/1978  BP Readings from Last 3 Encounters:  03/04/22 (!) 162/81  07/03/21 (!) 148/71  03/26/21 (!) 164/84   Pulse Readings from Last 3 Encounters:  03/04/22 90  07/03/21 87  03/26/21 88     PROVIDERS: Angelica Pou, MD ([facility]) Harrie Jeans, MD is nephrologist   LABS: For day of surgery as indicated. Last results include: Lab Results  Component Value Date   GLUCOSE 123 (H) 07/03/2021   ALT 21 08/19/2020   AST 20 08/19/2020   NA 144 07/03/2021   K 4.2 07/03/2021   CL 100 07/03/2021   CREATININE 2.26 (H) 07/03/2021   BUN 26 07/03/2021   CO2 27 07/03/2021   HGBA1C 6.5 (A) 07/03/2021     IMAGES: MRI Abd 2/722 (ordered by Dr. Link Snuffer re: right kidney cyst): IMPRESSION: 1. The lesion of concern in the lower pole of the right kidney appears simple by MRI (Bosniak 1) and is consistent with a benign finding. Additional innumerable simple renal cysts bilaterally. No enhancing renal masses identified. 2. Tiny cystic pancreatic lesions, likely benign. Per consensus guidelines,  consider follow-up imaging in 2 years to assess stability. This recommendation follows ACR consensus guidelines: Management of Incidental Pancreatic Cysts: A White Paper of the ACR Incidental Findings Committee. J Am Coll Radiol 2017;14:911-923. 3. Small hepatic cysts. 4. Small residual/recurrent hernia containing only fat, unchanged.   EKG: No recent EKGs. Previous EKG in [facility] from 08/30/08 showed: Normal sinus rhythm with sinus arrhythmia, nonspecific T wave abnormality.   CV: Echo 09/20/20: IMPRESSIONS   1. Left ventricular ejection fraction, by estimation, is 55 to 60%. The  left ventricle has normal function. The left ventricle has no regional  wall motion abnormalities. There is moderate concentric left ventricular  hypertrophy. Left ventricular  diastolic parameters are consistent with Grade II diastolic dysfunction  (pseudonormalization). Elevated left atrial pressure.   2. Right ventricular systolic function is normal. The right ventricular  size is normal. There is normal pulmonary artery systolic pressure.   3. The mitral valve is normal in structure. Mild to moderate mitral valve  regurgitation. No evidence of mitral stenosis.   4. The aortic valve is normal in structure. Aortic valve regurgitation is  not visualized. No aortic stenosis is present.   5. Aortic dilatation noted. There is mild dilatation of the ascending  aorta, measuring 40 mm.   6. The inferior vena cava is normal in size with greater than 50%  respiratory variability, suggesting right atrial pressure of 3 mmHg.    Past Medical History:  Diagnosis Date   Chronic kidney  disease    Diabetes mellitus    Diet-controlled. Not on antiglycemic medications.   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  Based on the size, patient's age, status as a former smoker, and its discovery in a community setting, her probability of malignancy is 3% (low probability).  Based on this, I recommend follow-up with a CT scan around 05/2014, and if lesion is stable or smaller, no further follow-up is needed.  CT Chest 06/2015: Stable 74m pulmonary nodule. No   Ventral hernia    Incisional ventral hernia. S/P repair by Dr. 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 WHulen Skains (08/2006)    Past Surgical History:  Procedure Laterality Date   APPENDECTOMY     COLON SURGERY  08/2006   Partial right colectomy and anastomosis with resection of villous adenoma.   HEMORRHOID SURGERY     hemorrhoidectomy   TONSILLECTOMY     TUBAL LIGATION     VENTRAL HERNIA REPAIR  08/2008   By Dr. WHulen Skains    MEDICATIONS: No current facility-administered medications for this encounter.    amLODipine (NORVASC) 10 MG tablet   furosemide (LASIX) 40 MG tablet   Accu-Chek FastClix Lancets MISC   Blood Glucose Monitoring Suppl (ACCU-CHEK NANO SMARTVIEW) w/Device KIT   carvedilol (COREG) 6.25 MG tablet   glucose blood (ACCU-CHEK GUIDE) test strip   hydrALAZINE (APRESOLINE) 25 MG tablet    AMyra Gianotti PA-C Surgical Short Stay/Anesthesiology MWaldorf Endoscopy CenterPhone [phone] [facility] OhioPhone ([phone]/21/2023 4:09 PM

## 2022-03-09 NOTE — Progress Notes (Signed)
SDW CALL  Patient with son, [name], was given pre-op instructions over the phone. The opportunity was given for the patient to ask questions. No further questions asked. Patient verbalized understanding of instructions given.   PCP - Dr. Angelica Pou Cardiologist - denies   Chest x-ray -  EKG - Needs DOS Stress Test - denies ECHO - 09-20-20 Cardiac Cath - denies   Fasting Blood Sugar - doesn't currently check at home, not on medication Checks Blood Sugar _____ times a day  Blood Thinner Instructions: n/a Aspirin Instructions:  ERAS Protcol - NPO PRE-SURGERY Ensure or G2-   COVID TEST- n/a  Pt reports she has not started dialysis yet.    Anesthesia review: NO  Patient denies shortness of breath, fever, cough and chest pain over the phone call   All instructions explained to the patient, with a verbal understanding of the material. Patient agrees to go over the instructions while at home for a better understanding.

## 2022-03-10 ENCOUNTER — Ambulatory Visit (HOSPITAL_COMMUNITY): Payer: Medicare PPO | Admitting: Vascular Surgery

## 2022-03-10 ENCOUNTER — Encounter (HOSPITAL_COMMUNITY)
Admission: RE | Disposition: A | Discharge status: Home / Self Care | Payer: Self-pay | Source: Home / Self Care | Attending: Vascular Surgery

## 2022-03-10 ENCOUNTER — Other Ambulatory Visit: Payer: Self-pay

## 2022-03-10 ENCOUNTER — Ambulatory Visit (HOSPITAL_COMMUNITY)
Admission: RE | Admit: 2022-03-10 | Discharge: 2022-03-10 | Disposition: A | Discharge status: Home / Self Care | Payer: Medicare PPO | Attending: Vascular Surgery | Admitting: Vascular Surgery

## 2022-03-10 ENCOUNTER — Ambulatory Visit (HOSPITAL_BASED_OUTPATIENT_CLINIC_OR_DEPARTMENT_OTHER): Payer: Medicare PPO | Admitting: Vascular Surgery

## 2022-03-10 ENCOUNTER — Encounter (HOSPITAL_COMMUNITY): Payer: Self-pay | Admitting: Vascular Surgery

## 2022-03-10 DIAGNOSIS — Z87891 Personal history of nicotine dependence: Secondary | ICD-10-CM | POA: Insufficient documentation

## 2022-03-10 DIAGNOSIS — E1122 Type 2 diabetes mellitus with diabetic chronic kidney disease: Secondary | ICD-10-CM

## 2022-03-10 DIAGNOSIS — D631 Anemia in chronic kidney disease: Secondary | ICD-10-CM | POA: Diagnosis not present

## 2022-03-10 DIAGNOSIS — N189 Chronic kidney disease, unspecified: Secondary | ICD-10-CM | POA: Insufficient documentation

## 2022-03-10 DIAGNOSIS — E785 Hyperlipidemia, unspecified: Secondary | ICD-10-CM | POA: Diagnosis not present

## 2022-03-10 DIAGNOSIS — N185 Chronic kidney disease, stage 5: Secondary | ICD-10-CM | POA: Diagnosis not present

## 2022-03-10 DIAGNOSIS — Z7984 Long term (current) use of oral hypoglycemic drugs: Secondary | ICD-10-CM | POA: Diagnosis not present

## 2022-03-10 DIAGNOSIS — I129 Hypertensive chronic kidney disease with stage 1 through stage 4 chronic kidney disease, or unspecified chronic kidney disease: Secondary | ICD-10-CM | POA: Insufficient documentation

## 2022-03-10 HISTORY — PX: AV FISTULA PLACEMENT: SHX1204

## 2022-03-10 LAB — POCT I-STAT, CHEM 8
BUN: 37 mg/dL — ABNORMAL HIGH (ref 8–23)
Calcium, Ion: 1.13 mmol/L — ABNORMAL LOW (ref 1.15–1.40)
Chloride: 108 mmol/L (ref 98–111)
Creatinine, Ser: 4.4 mg/dL — ABNORMAL HIGH (ref 0.44–1.00)
Glucose, Bld: 94 mg/dL (ref 70–99)
HCT: 31 % — ABNORMAL LOW (ref 36.0–46.0)
Hemoglobin: 10.5 g/dL — ABNORMAL LOW (ref 12.0–15.0)
Potassium: 3.5 mmol/L (ref 3.5–5.1)
Sodium: 145 mmol/L (ref 135–145)
TCO2: 24 mmol/L (ref 22–32)

## 2022-03-10 LAB — GLUCOSE, CAPILLARY
Glucose-Capillary: 67 mg/dL — ABNORMAL LOW (ref 70–99)
Glucose-Capillary: 98 mg/dL (ref 70–99)
Glucose-Capillary: 102 mg/dL — ABNORMAL HIGH (ref 70–99)

## 2022-03-10 SURGERY — ARTERIOVENOUS (AV) FISTULA CREATION
Anesthesia: Monitor Anesthesia Care | Site: Arm Lower | Laterality: Left

## 2022-03-10 MED ORDER — HEPARIN 6000 UNIT IRRIGATION SOLUTION
Status: AC
  Filled 2022-03-10: qty 500

## 2022-03-10 MED ORDER — CHLORHEXIDINE GLUCONATE 4 % EX LIQD: 60.0000 mL | Freq: Once | CUTANEOUS | Status: DC

## 2022-03-10 MED ORDER — DEXTROSE 50 % IV SOLN
12.5000 g | Freq: Once | INTRAVENOUS | Status: AC
  Administered 2022-03-10: 12.5 g via INTRAVENOUS

## 2022-03-10 MED ORDER — LIDOCAINE 2% (20 MG/ML) 5 ML SYRINGE
INTRAMUSCULAR | Status: AC
  Filled 2022-03-10: qty 5

## 2022-03-10 MED ORDER — PROPOFOL 10 MG/ML IV BOLUS
INTRAVENOUS | Status: DC | PRN
  Administered 2022-03-10: 20 mg via INTRAVENOUS

## 2022-03-10 MED ORDER — CEFAZOLIN SODIUM-DEXTROSE 2-4 GM/100ML-% IV SOLN
2.0000 g | INTRAVENOUS | Status: AC
  Administered 2022-03-10: 2 g via INTRAVENOUS
  Filled 2022-03-10: qty 100

## 2022-03-10 MED ORDER — DEXAMETHASONE SODIUM PHOSPHATE 10 MG/ML IJ SOLN
INTRAMUSCULAR | Status: AC
Start: 2022-03-10 — End: ?
  Filled 2022-03-10: qty 1

## 2022-03-10 MED ORDER — 0.9 % SODIUM CHLORIDE (POUR BTL) OPTIME
TOPICAL | Status: DC | PRN
  Administered 2022-03-10: 1000 mL

## 2022-03-10 MED ORDER — ACETAMINOPHEN 500 MG PO TABS
1000.0000 mg | ORAL_TABLET | Freq: Once | ORAL | Status: AC
  Administered 2022-03-10: 1000 mg via ORAL
  Filled 2022-03-10: qty 2

## 2022-03-10 MED ORDER — DEXTROSE 50 % IV SOLN
INTRAVENOUS | Status: AC
  Filled 2022-03-10: qty 50

## 2022-03-10 MED ORDER — PROPOFOL 500 MG/50ML IV EMUL
INTRAVENOUS | Status: DC | PRN
  Administered 2022-03-10: 75 ug/kg/min via INTRAVENOUS

## 2022-03-10 MED ORDER — LIDOCAINE-EPINEPHRINE 1 %-1:200000 IJ SOLN
INTRAMUSCULAR | Status: AC
Start: 2022-03-10 — End: ?
  Filled 2022-03-10: qty 30

## 2022-03-10 MED ORDER — PROPOFOL 10 MG/ML IV BOLUS
INTRAVENOUS | Status: AC
  Filled 2022-03-10: qty 20

## 2022-03-10 MED ORDER — FENTANYL CITRATE (PF) 100 MCG/2ML IJ SOLN
INTRAMUSCULAR | Status: AC
  Administered 2022-03-10: 50 ug via INTRAVENOUS
  Filled 2022-03-10: qty 2

## 2022-03-10 MED ORDER — PHENYLEPHRINE 80 MCG/ML (10ML) SYRINGE FOR IV PUSH (FOR BLOOD PRESSURE SUPPORT)
PREFILLED_SYRINGE | INTRAVENOUS | Status: DC | PRN
  Administered 2022-03-10 (×2): 80 ug via INTRAVENOUS

## 2022-03-10 MED ORDER — FENTANYL CITRATE (PF) 250 MCG/5ML IJ SOLN
INTRAMUSCULAR | Status: DC | PRN
  Administered 2022-03-10: 25 ug via INTRAVENOUS

## 2022-03-10 MED ORDER — FENTANYL CITRATE (PF) 100 MCG/2ML IJ SOLN: 50.0000 ug | Freq: Once | INTRAMUSCULAR | Status: AC

## 2022-03-10 MED ORDER — SODIUM CHLORIDE 0.9 % IV SOLN: INTRAVENOUS | Status: DC

## 2022-03-10 MED ORDER — CHLORHEXIDINE GLUCONATE 0.12 % MT SOLN
15.0000 mL | Freq: Once | OROMUCOSAL | Status: AC
Start: 2022-03-10 — End: 2022-03-10
  Administered 2022-03-10: 15 mL via OROMUCOSAL
  Filled 2022-03-10: qty 15

## 2022-03-10 MED ORDER — ORAL CARE MOUTH RINSE
15.0000 mL | Freq: Once | OROMUCOSAL | Status: AC
Start: 2022-03-10 — End: 2022-03-10

## 2022-03-10 MED ORDER — HEPARIN 6000 UNIT IRRIGATION SOLUTION
Status: DC | PRN
  Administered 2022-03-10: 1

## 2022-03-10 MED ORDER — DEXAMETHASONE SODIUM PHOSPHATE 10 MG/ML IJ SOLN
INTRAMUSCULAR | Status: DC | PRN
  Administered 2022-03-10: 10 mg via INTRAVENOUS

## 2022-03-10 MED ORDER — MIDAZOLAM HCL 2 MG/2ML IJ SOLN
INTRAMUSCULAR | Status: AC
  Filled 2022-03-10: qty 2

## 2022-03-10 MED ORDER — FENTANYL CITRATE (PF) 250 MCG/5ML IJ SOLN
INTRAMUSCULAR | Status: AC
  Filled 2022-03-10: qty 5

## 2022-03-10 MED ORDER — ONDANSETRON HCL 4 MG/2ML IJ SOLN
INTRAMUSCULAR | Status: DC | PRN
  Administered 2022-03-10: 4 mg via INTRAVENOUS

## 2022-03-10 MED ORDER — HYDROCODONE-ACETAMINOPHEN 5-325 MG PO TABS
1.0000 | ORAL_TABLET | Freq: Four times a day (QID) | ORAL | 0 refills | Status: DC | PRN
Start: 2022-03-10 — End: 2022-04-08

## 2022-03-10 MED ORDER — LIDOCAINE 2% (20 MG/ML) 5 ML SYRINGE
INTRAMUSCULAR | Status: DC | PRN
  Administered 2022-03-10: 20 mg via INTRAVENOUS

## 2022-03-10 MED ORDER — ONDANSETRON HCL 4 MG/2ML IJ SOLN
INTRAMUSCULAR | Status: AC
  Filled 2022-03-10: qty 2

## 2022-03-10 MED ORDER — ROPIVACAINE HCL 5 MG/ML IJ SOLN
INTRAMUSCULAR | Status: DC | PRN
  Administered 2022-03-10: 30 mL via PERINEURAL

## 2022-03-10 MED ORDER — INSULIN ASPART 100 UNIT/ML IJ SOLN: 0.0000 [IU] | INTRAMUSCULAR | Status: DC | PRN

## 2022-03-10 SURGICAL SUPPLY — 35 items
ADH SKN CLS APL DERMABOND .7 (GAUZE/BANDAGES/DRESSINGS) ×1
ADH SKN CLS LQ APL DERMABOND (GAUZE/BANDAGES/DRESSINGS) ×1
ARMBAND PINK RESTRICT EXTREMIT (MISCELLANEOUS) ×1 IMPLANT
BAG COUNTER SPONGE SURGICOUNT (BAG) ×1 IMPLANT
BAG SPNG CNTER NS LX DISP (BAG) ×1
CANISTER SUCT 3000ML PPV (MISCELLANEOUS) ×1 IMPLANT
CLIP LIGATING EXTRA MED SLVR (CLIP) ×2 IMPLANT
CLIP LIGATING EXTRA SM BLUE (MISCELLANEOUS) ×1 IMPLANT
COVER PROBE W GEL 5X96 (DRAPES) IMPLANT
DERMABOND ADHESIVE PROPEN (GAUZE/BANDAGES/DRESSINGS) ×1
DERMABOND ADVANCED (GAUZE/BANDAGES/DRESSINGS) ×1
DERMABOND ADVANCED .7 DNX12 (GAUZE/BANDAGES/DRESSINGS) ×1 IMPLANT
DERMABOND ADVANCED .7 DNX6 (GAUZE/BANDAGES/DRESSINGS) IMPLANT
ELECT REM PT RETURN 9FT ADLT (ELECTROSURGICAL) ×1
ELECTRODE REM PT RTRN 9FT ADLT (ELECTROSURGICAL) ×1 IMPLANT
GLOVE BIO SURGEON STRL SZ7.5 (GLOVE) ×1 IMPLANT
GOWN STRL REUS W/ TWL LRG LVL3 (GOWN DISPOSABLE) ×2 IMPLANT
GOWN STRL REUS W/ TWL XL LVL3 (GOWN DISPOSABLE) ×1 IMPLANT
GOWN STRL REUS W/TWL LRG LVL3 (GOWN DISPOSABLE) ×2
GOWN STRL REUS W/TWL XL LVL3 (GOWN DISPOSABLE) ×1
INSERT FOGARTY SM (MISCELLANEOUS) IMPLANT
KIT BASIN OR (CUSTOM PROCEDURE TRAY) ×2 IMPLANT
KIT TURNOVER KIT B (KITS) ×1 IMPLANT
NS IRRIG 1000ML POUR BTL (IV SOLUTION) ×1 IMPLANT
PACK CV ACCESS (CUSTOM PROCEDURE TRAY) ×1 IMPLANT
PAD ARMBOARD 7.5X6 YLW CONV (MISCELLANEOUS) ×2 IMPLANT
SLING ARM FOAM STRAP LRG (SOFTGOODS) IMPLANT
SLING ARM FOAM STRAP MED (SOFTGOODS) IMPLANT
SUT MNCRL AB 4-0 PS2 18 (SUTURE) ×1 IMPLANT
SUT PROLENE 6 0 BV (SUTURE) ×1 IMPLANT
SUT VIC AB 3-0 SH 27 (SUTURE) ×1
SUT VIC AB 3-0 SH 27X BRD (SUTURE) ×1 IMPLANT
TOWEL GREEN STERILE (TOWEL DISPOSABLE) ×1 IMPLANT
UNDERPAD 30X36 HEAVY ABSORB (UNDERPADS AND DIAPERS) ×1 IMPLANT
WATER STERILE IRR 1000ML POUR (IV SOLUTION) ×1 IMPLANT

## 2022-03-10 NOTE — Anesthesia Postprocedure Evaluation (Signed)
Anesthesia Post Note  Patient: Gina Bailey  Procedure(s) Performed: LEFT ARM ARTERIOVENOUS (AV) FISTULA CREATION VERSUS GRAFT (Left: Arm Lower)     Patient location during evaluation: PACU Anesthesia Type: Regional Level of consciousness: awake and alert Pain management: pain level controlled Vital Signs Assessment: post-procedure vital signs reviewed and stable Respiratory status: spontaneous breathing, nonlabored ventilation, respiratory function stable and patient connected to nasal cannula oxygen Cardiovascular status: stable and blood pressure returned to baseline Postop Assessment: no apparent nausea or vomiting Anesthetic complications: no   No notable events documented.  Last Vitals:  Vitals:   03/10/22 1040 03/10/22 1055  BP: 131/72   Pulse: 79 73  Resp: 20 13  Temp:  36.7 C  SpO2: 94% 97%    Last Pain:  Vitals:   03/10/22 1055  TempSrc:   PainSc: 0-No pain                 Santa Lighter

## 2022-03-10 NOTE — Op Note (Signed)
    Patient name: Gina Bailey MRN: [id number] DOB: October 06, 1945 Sex: female  03/10/2022 Pre-operative Diagnosis: Chronic kidney disease Post-operative diagnosis:  Same Surgeon:  Eda Paschal. Donzetta Matters, MD Assistant: Arlee Muslim, PA Procedure Performed:   Left upper arm brachial artery to cephalic vein AV fistula creation  Indications: 76 year old female with history of chronic kidney disease now with need for dialysis access.  She has suitable cephalic vein in the left upper arm which is her nondominant extremity for fistula creation.  Findings: The cephalic vein easily measured 4 mm external diameter was without any disease and the brachial artery was also large without disease and at completion there was a palpable fistula in the upper arm and a palpable radial artery pulse at the wrist both confirmed with Doppler.   Procedure:  The patient was identified in the holding area and taken to the operating room where she was placed supine operative table and MAC anesthesia was induced.  She was sterilely prepped and draped in the left upper extremity usual fashion, antibiotics were administered and a timeout was called.  A preoperative block of been placed and this was tested and noted to be intact.  Ultrasound was used to identify a suitable cephalic vein in brachial artery at the antecubital and a transverse incision was made between the 2.  We dissected out the vein first dividing branches between clips and ties and marked for orientation.  We dissected through the deep fascia to the brachial artery and placed a vessel loop around this.  The cephalic vein was then transected distally and tied off flush with heparinized saline and spatulated we did free up some soft tissue around this to allow it to seat nicely.  The brachial artery was then clamped distally and proximally opened longitudinally flushed only distally with heparinized saline given the large size.  We then sewed the vein into side with 6-0  Prolene suture.  Prior completion of flushing all directions.  Upon completion there was a very strong thrill to be traced throughout the upper arm with Doppler and there was also a palpable radial artery pulse the wrist confirmed with Doppler.  We then irrigated the wound obtain hemostasis and closed in layers with Vicryl and Monocryl.  Dermabond was placed at the skin level.  She was awakened from anesthesia having tolerated procedure without immediate complication.  All counts were correct at completion.  Given the complexity of the case,  the assistant was necessary in order to expedient the procedure and safely perform the technical aspects of the operation.  The assistant provided traction and countertraction to assist with exposure of the artery and vein.  They also assisted with suture ligation of multiple venous branches.  They played a critical role in the anastomosis. These skills, especially following the Prolene suture for the anastomosis, could not have been adequately performed by a scrub tech assistant.   EBL: 20 cc     Naythan Douthit C. Donzetta Matters, MD [facility] Office: [phone] Pager: [phone]

## 2022-03-10 NOTE — Discharge Instructions (Signed)
° °  [facility] ° °Discharge Instructions ° °AV Fistula or Graft Surgery for Dialysis Access ° °Please refer to the following instructions for your post-procedure care. Your surgeon or physician assistant will discuss any changes with you. ° °Activity ° °You may drive the day following your surgery, if you are comfortable and no longer taking prescription pain medication. Resume full activity as the soreness in your incision resolves. ° °Bathing/Showering ° °You may shower after you go home. Keep your incision dry for 48 hours. Do not soak in a bathtub, hot tub, or swim until the incision heals completely. You may not shower if you have a hemodialysis catheter. ° °Incision Care ° °Clean your incision with mild soap and water after 48 hours. Pat the area dry with a clean towel. You do not need a bandage unless otherwise instructed. Do not apply any ointments or creams to your incision. You may have skin glue on your incision. Do not peel it off. It will come off on its own in about one week. Your arm may swell a bit after surgery. To reduce swelling use pillows to elevate your arm so it is above your heart. Your doctor will tell you if you need to lightly wrap your arm with an ACE bandage. ° °Diet ° °Resume your normal diet. There are not special food restrictions following this procedure. In order to heal from your surgery, it is CRITICAL to get adequate nutrition. Your body requires vitamins, minerals, and protein. Vegetables are the best source of vitamins and minerals. Vegetables also provide the perfect balance of protein. Processed food has little nutritional value, so try to avoid this. ° °Medications ° °Resume taking all of your medications. If your incision is causing pain, you may take over-the counter pain relievers such as acetaminophen (Tylenol). If you were prescribed a stronger pain medication, please be aware these medications can cause nausea and constipation. Prevent  nausea by taking the medication with a snack or meal. Avoid constipation by drinking plenty of fluids and eating foods with high amount of fiber, such as fruits, vegetables, and grains. Do not take Tylenol if you are taking prescription pain medications. ° ° ° ° °Follow up °Your surgeon may want to see you in the office following your access surgery. If so, this will be arranged at the time of your surgery. ° °Please call us immediately for any of the following conditions: ° °Increased pain, redness, drainage (pus) from your incision site °Fever of 101 degrees or higher °Severe or worsening pain at your incision site °Hand pain or numbness. ° °Reduce your risk of vascular disease: ° °Stop smoking. If you would like help, call [facility] at 1-800-QUIT-NOW (1-800-784-8669) or [place] at 336-586-4000 ° °Manage your cholesterol °Maintain a desired weight °Control your diabetes °Keep your blood pressure down ° °Dialysis ° °It will take several weeks to several months for your new dialysis access to be ready for use. Your surgeon will determine when it is OK to use it. Your nephrologist will continue to direct your dialysis. You can continue to use your Permcath until your new access is ready for use. ° °If you have any questions, please call the office at [phone]. ° °

## 2022-03-10 NOTE — Progress Notes (Signed)
CBG 67 when patient arrived in short stay. Per protocol, patient received IV Detrose 50% - 12.5 g . CBG came up to 98. Dr. Gifford Shave notified. Will continue to monitor.

## 2022-03-10 NOTE — Interval H&P Note (Signed)
History and Physical Interval Note:  03/10/2022 7:27 AM  Gina Bailey  has presented today for surgery, with the diagnosis of Chronic Kidney Disease.  The various methods of treatment have been discussed with the patient and family. After consideration of risks, benefits and other options for treatment, the patient has consented to  Procedure(s): LEFT ARM ARTERIOVENOUS (AV) FISTULA CREATION VERSUS GRAFT (Left) as a surgical intervention.  The patient's history has been reviewed, patient examined, no change in status, stable for surgery.  I have reviewed the patient's chart and labs.  Questions were answered to the patient's satisfaction.     Servando Snare

## 2022-03-10 NOTE — Anesthesia Procedure Notes (Signed)
Anesthesia Regional Block: Supraclavicular block   Pre-Anesthetic Checklist: , timeout performed,  Correct Patient, Correct Site, Correct Laterality,  Correct Procedure, Correct Position, site marked,  Risks and benefits discussed,  Surgical consent,  Pre-op evaluation,  At surgeon's request and post-op pain management  Laterality: Left  Prep: chloraprep       Needles:  Injection technique: Single-shot  Needle Type: Echogenic Needle     Needle Length: 9cm  Needle Gauge: 21     Additional Needles:   Procedures:,,,, ultrasound used (permanent image in chart),,    Narrative:  Start time: 03/10/2022 8:48 AM End time: 03/10/2022 8:55 AM Injection made incrementally with aspirations every 5 mL.  Performed by: Personally  Anesthesiologist: Santa Lighter, MD  Additional Notes: No pain on injection. No increased resistance to injection. Injection made in 5cc increments.  Good needle visualization.  Patient tolerated procedure well.

## 2022-03-10 NOTE — Anesthesia Procedure Notes (Signed)
Procedure Name: MAC Date/Time: 03/10/2022 9:46 AM  Performed by: Lavell Luster, CRNAPre-anesthesia Checklist: Patient identified, Emergency Drugs available, Suction available, Patient being monitored and Timeout performed Oxygen Delivery Method: Nasal cannula and Simple face mask Preoxygenation: Pre-oxygenation with 100% oxygen Induction Type: IV induction Placement Confirmation: breath sounds checked- equal and bilateral and positive ETCO2 Dental Injury: Teeth and Oropharynx as per pre-operative assessment

## 2022-03-10 NOTE — Transfer of Care (Signed)
Immediate Anesthesia Transfer of Care Note  Patient: Gina Bailey  Procedure(s) Performed: LEFT ARM ARTERIOVENOUS (AV) FISTULA CREATION VERSUS GRAFT (Left: Arm Lower)  Patient Location: PACU  Anesthesia Type:MAC  Level of Consciousness: awake, alert  and oriented  Airway & Oxygen Therapy: Patient connected to nasal cannula oxygen  Post-op Assessment: Post -op Vital signs reviewed and stable  Post vital signs: stable  Last Vitals:  Vitals Value Taken Time  BP 130/80 03/10/22 1025  Temp    Pulse    Resp 17 03/10/22 1026  SpO2    Vitals shown include unvalidated device data.  Last Pain:  Vitals:   03/10/22 0816  TempSrc:   PainSc: 0-No pain         Complications: No notable events documented.

## 2022-03-11 ENCOUNTER — Encounter (HOSPITAL_COMMUNITY): Payer: Self-pay | Admitting: Vascular Surgery

## 2022-03-27 ENCOUNTER — Other Ambulatory Visit: Payer: Self-pay | Admitting: *Deleted

## 2022-03-27 DIAGNOSIS — E1122 Type 2 diabetes mellitus with diabetic chronic kidney disease: Secondary | ICD-10-CM

## 2022-04-02 DIAGNOSIS — Q6102 Congenital multiple renal cysts: Secondary | ICD-10-CM | POA: Diagnosis not present

## 2022-04-02 DIAGNOSIS — N184 Chronic kidney disease, stage 4 (severe): Secondary | ICD-10-CM | POA: Diagnosis not present

## 2022-04-02 DIAGNOSIS — R809 Proteinuria, unspecified: Secondary | ICD-10-CM | POA: Diagnosis not present

## 2022-04-02 DIAGNOSIS — I120 Hypertensive chronic kidney disease with stage 5 chronic kidney disease or end stage renal disease: Secondary | ICD-10-CM | POA: Diagnosis not present

## 2022-04-02 DIAGNOSIS — N2581 Secondary hyperparathyroidism of renal origin: Secondary | ICD-10-CM | POA: Diagnosis not present

## 2022-04-02 DIAGNOSIS — E876 Hypokalemia: Secondary | ICD-10-CM | POA: Diagnosis not present

## 2022-04-02 DIAGNOSIS — N185 Chronic kidney disease, stage 5: Secondary | ICD-10-CM | POA: Diagnosis not present

## 2022-04-02 DIAGNOSIS — E1122 Type 2 diabetes mellitus with diabetic chronic kidney disease: Secondary | ICD-10-CM | POA: Diagnosis not present

## 2022-04-08 ENCOUNTER — Ambulatory Visit (INDEPENDENT_AMBULATORY_CARE_PROVIDER_SITE_OTHER): Payer: Medicare PPO | Admitting: Physician Assistant

## 2022-04-08 ENCOUNTER — Ambulatory Visit (HOSPITAL_COMMUNITY)
Admission: RE | Admit: 2022-04-08 | Discharge: 2022-04-08 | Disposition: A | Discharge status: Home / Self Care | Payer: Medicare PPO | Source: Ambulatory Visit | Attending: Vascular Surgery | Admitting: Vascular Surgery

## 2022-04-08 VITALS — BP 141/74 | HR 75 | Temp 98.0°F | Ht 65.0 in | Wt 173.0 lb

## 2022-04-08 DIAGNOSIS — N184 Chronic kidney disease, stage 4 (severe): Secondary | ICD-10-CM | POA: Diagnosis not present

## 2022-04-08 DIAGNOSIS — E1122 Type 2 diabetes mellitus with diabetic chronic kidney disease: Secondary | ICD-10-CM | POA: Diagnosis not present

## 2022-04-08 NOTE — Progress Notes (Signed)
    Postoperative Access Visit   History of Present Illness   Gina Bailey is a 76 y.o. year old female who presents for postoperative follow-up for: left brachiocephalic arteriovenous fistula (Date: 03/10/22).  The patient's wounds are healed.  The patient denies steal symptoms.  The patient is able to complete their activities of daily living.  She is not yet on HD.    Physical Examination   Vitals:   04/08/22 0929  BP: (!) 141/74  Pulse: 75  Temp: 98 F (36.7 C)  TempSrc: Temporal  SpO2: 100%  Weight: 173 lb (78.5 kg)  Height: '5\' 5"'$  (1.651 m)   Body mass index is 28.79 kg/m.  left arm Incision is healed, palpable radial pulse, hand grip is 5/5, sensation in digits is intact, palpable thrill, bruit can be auscultated     Medical Decision Making   TABBATHA BORDELON is a 76 y.o. year old female who presents s/p left brachiocephalic arteriovenous fistula  Patent L brachiocephalic fistula without signs or symptoms of steal syndrome The patient's access will be ready for use 06/10/22 If the patient is initiated on HD prior to the above date, please contact [facility] to decided to use the fistula early vs place a [facility] The patient may follow up on a prn basis   Dagoberto Ligas PA-C [facility] Office: [facility] MD: Donzetta Matters

## 2022-04-09 ENCOUNTER — Encounter: Payer: Self-pay | Admitting: Internal Medicine

## 2022-04-09 ENCOUNTER — Ambulatory Visit (INDEPENDENT_AMBULATORY_CARE_PROVIDER_SITE_OTHER): Payer: Medicare PPO | Admitting: Internal Medicine

## 2022-04-09 VITALS — BP 142/67 | HR 71 | Temp 98.0°F | Ht 65.0 in | Wt 174.6 lb

## 2022-04-09 DIAGNOSIS — Z23 Encounter for immunization: Secondary | ICD-10-CM | POA: Diagnosis not present

## 2022-04-09 DIAGNOSIS — D631 Anemia in chronic kidney disease: Secondary | ICD-10-CM | POA: Insufficient documentation

## 2022-04-09 DIAGNOSIS — Z87891 Personal history of nicotine dependence: Secondary | ICD-10-CM

## 2022-04-09 DIAGNOSIS — N184 Chronic kidney disease, stage 4 (severe): Secondary | ICD-10-CM | POA: Diagnosis not present

## 2022-04-09 DIAGNOSIS — E1122 Type 2 diabetes mellitus with diabetic chronic kidney disease: Secondary | ICD-10-CM

## 2022-04-09 DIAGNOSIS — I10 Essential (primary) hypertension: Secondary | ICD-10-CM

## 2022-04-09 DIAGNOSIS — E1169 Type 2 diabetes mellitus with other specified complication: Secondary | ICD-10-CM

## 2022-04-09 DIAGNOSIS — E785 Hyperlipidemia, unspecified: Secondary | ICD-10-CM

## 2022-04-09 DIAGNOSIS — R0989 Other specified symptoms and signs involving the circulatory and respiratory systems: Secondary | ICD-10-CM

## 2022-04-09 DIAGNOSIS — E1142 Type 2 diabetes mellitus with diabetic polyneuropathy: Secondary | ICD-10-CM

## 2022-04-09 DIAGNOSIS — I340 Nonrheumatic mitral (valve) insufficiency: Secondary | ICD-10-CM | POA: Diagnosis not present

## 2022-04-09 DIAGNOSIS — E1151 Type 2 diabetes mellitus with diabetic peripheral angiopathy without gangrene: Secondary | ICD-10-CM

## 2022-04-09 DIAGNOSIS — I739 Peripheral vascular disease, unspecified: Secondary | ICD-10-CM

## 2022-04-09 DIAGNOSIS — I129 Hypertensive chronic kidney disease with stage 1 through stage 4 chronic kidney disease, or unspecified chronic kidney disease: Secondary | ICD-10-CM | POA: Diagnosis not present

## 2022-04-09 DIAGNOSIS — R011 Cardiac murmur, unspecified: Secondary | ICD-10-CM | POA: Insufficient documentation

## 2022-04-09 DIAGNOSIS — R2689 Other abnormalities of gait and mobility: Secondary | ICD-10-CM | POA: Diagnosis not present

## 2022-04-09 DIAGNOSIS — R2681 Unsteadiness on feet: Secondary | ICD-10-CM

## 2022-04-09 DIAGNOSIS — N189 Chronic kidney disease, unspecified: Secondary | ICD-10-CM | POA: Insufficient documentation

## 2022-04-09 LAB — GLUCOSE, CAPILLARY: Glucose-Capillary: 101 mg/dL — ABNORMAL HIGH (ref 70–99)

## 2022-04-09 LAB — POCT GLYCOSYLATED HEMOGLOBIN (HGB A1C): Hemoglobin A1C: 5.4 % (ref 4.0–5.6)

## 2022-04-09 MED ORDER — FUROSEMIDE 40 MG PO TABS: ORAL_TABLET | ORAL | 5 refills | Status: DC

## 2022-04-09 NOTE — Assessment & Plan Note (Signed)
Better control achieved when her son began managing medications.  Currently on amlodipine 10 mg daily, Lasix 80 mg every morning/40 mg every afternoon, carvedilol 6.25 mg twice daily.  Very close to goal, which should continue to improve after recent increase in her Lasix dosing.

## 2022-04-09 NOTE — Assessment & Plan Note (Signed)
Lungs clear, no orthopnea.  Monitor.

## 2022-04-09 NOTE — Patient Instructions (Signed)
Ms. Speich, I am so glad we could get together today, and relieved that you're doing ok.  Let's get together soon to talk in more depth about how to keep you as healthy as possible.  These visits are too short!  I have sent a referral for a home physical therapy evaluation to see if any walking aids would be of use to help prevent falls.  At next visit we'll clean your ear canals and talk about some other important things.  Take care and stay well,  Dr. Jimmye Norman

## 2022-04-09 NOTE — Assessment & Plan Note (Signed)
Pedal pulses not palpable due to significant pedal edema related to CKD and diastolic dysfunction.  Feet without pain, no foot lesions.

## 2022-04-09 NOTE — Progress Notes (Signed)
Gina Bailey is here for routine follow-up of chronic conditions including advanced CKD, T2DM, poorly controlled HTN, among other problems.  She missed a previous visit scheduled in May.  Since her last encounter with me 06/2021, her son has moved in with her and has taken over monitoring, medication management, and assistance with various needs.  She has undergone left upper extremity AVF and has a follow-up appointment with nephrologist 05/2022.  The family are looking forward to having some education regarding what to expect with HD.  Lasix has recently been increased from 40 mg daily to 80 mg every morning and 40 mg every afternoon.  Son relates that all but 4 of her medications have been discontinued (now on Lasix, amlodipine, calcitriol, and carvedilol).  She does not check her blood sugars routinely as she is no longer taking medication for diabetes.  Her greatest complaint is LE edema which is currently being managed with compression stockings (her sons help don/doff), though her dorsal foot edema is exceeding the capacity of her new diabetic shoes.  She experiences no dyspnea when lying flat or when at rest.  No chest tightness or palpitations.  No cough.  She has been eating and sleeping well.  Has experienced 2 falls, fortunately noninjurious, each which occurred when tripping over something on the ground.  She does not typically watch the ground when she walks.  Endorses feeling a bit unsure of herself especially if on uneven ground.  Family are interested in a physical therapy assessment and any recommendations for mobility assistive devices and fall risk reduction.  Physical exam: BP (!) 142/67 (BP Location: Right Arm, Patient Position: Sitting, Cuff Size: Small)   Pulse 71   Temp 98 F (36.7 C) (Oral)   Ht '5\' 5"'$  (1.651 m)   Wt 174 lb 9.6 oz (79.2 kg)   LMP 10/22/1978   SpO2 100%   BMI 29.05 kg/m  Nicely dressed and groomed, arrives in a transport chair.  Attentive, quiet, but verbal  responses are appropriate with clear diction. Heart mid systolic murmur throughout 3+ pitting edema to the knees, compression stockings in place which are well fitting, no impending loss of skin integrity or changes in color of LEs. Lungs clear No JVD L AVF with thrill, healing well L radial pulse palpable, hands symmetric coolness, no cyanosis, good cr R ear canal near total occlusion, L ear canal total cerumen occlusion  Assessment and plan:  Gina Bailey is doing better under family supervision and medication management.  To follow-up on former concerns about declining cognition and unsteady gait, I will further evaluate her in the geriatrics assessment clinic and refer her for home health PT to assess fall risk and explore mobility assistive devices.  Her family is in agreement.    CKD stage 4 due to type 2 diabetes mellitus [facility]) Active surveillance at [place], Dr. Royce Macadamia- next visit in 05/2022.  LUE AVF placed, will be mature 05/2022. Family desire education about pros/cons/process of HD.  Caregap flags for urine protein/creatinine which will not be ordered as she is preparing for dialysis and the test will not change course of action.  Diabetic neuropathy (HCC) No discomfort.  Monofilament sensation testing showed reduced peripheral sensation over feet at recent podiatry visit 02/2022.    Hyperlipidemia associated with type 2 diabetes mellitus (HCC) Statin has fallen off the medication list.  Tight control desirable to help reduce risk of CVD particularly in this lady nearing ESRD. Recheck lipids today.  T2DM (type 2 diabetes  mellitus) ([place]) On no diabetes medications.  Check A1C today.  She doesn't monitor her CBGs at home, which is acceptable.  Eye appointment has been scheduled for this Fall.  Absent pedal pulses Pedal pulses not palpable due to significant pedal edema related to CKD and diastolic dysfunction.  Feet without pain, no foot lesions.    Anemia of chronic renal  failure Hgb 10, normocytic.  Monitor.    Mitral regurgitation Lungs clear, no orthopnea.  Monitor.  Hypertension Better control achieved when her son began managing medications.  Currently on amlodipine 10 mg daily, Lasix 80 mg every morning/40 mg every afternoon, carvedilol 6.25 mg twice daily.  Very close to goal, which should continue to improve after recent increase in her Lasix dosing.

## 2022-04-09 NOTE — Assessment & Plan Note (Signed)
No discomfort.  Monofilament sensation testing showed reduced peripheral sensation over feet at recent podiatry visit 02/2022.

## 2022-04-09 NOTE — Assessment & Plan Note (Signed)
Statin has fallen off the medication list.  Tight control desirable to help reduce risk of CVD particularly in this lady nearing ESRD. Recheck lipids today.

## 2022-04-09 NOTE — Assessment & Plan Note (Addendum)
On no diabetes medications.  Check A1C today.  She doesn't monitor her CBGs at home, which is acceptable.  Eye appointment has been scheduled for this Fall.

## 2022-04-09 NOTE — Assessment & Plan Note (Signed)
Hgb 10, normocytic.  Monitor.

## 2022-04-09 NOTE — Assessment & Plan Note (Addendum)
Active surveillance at [place], Dr. Royce Macadamia- next visit in 05/2022.  LUE AVF placed, will be mature 05/2022. Family desire education about pros/cons/process of HD.  Caregap flags for urine protein/creatinine which will not be ordered as she is preparing for dialysis and the test will not change course of action.

## 2022-04-10 LAB — LIPID PANEL
Chol/HDL Ratio: 2.9 ratio (ref 0.0–4.4)
Cholesterol, Total: 223 mg/dL — ABNORMAL HIGH (ref 100–199)
HDL: 78 mg/dL (ref >39)
LDL Chol Calc (NIH): 135 mg/dL — ABNORMAL HIGH (ref 0–99)
Triglycerides: 58 mg/dL (ref 0–149)
VLDL Cholesterol Cal: 10 mg/dL (ref 5–40)

## 2022-04-10 LAB — BMP8+ANION GAP
Anion Gap: 16 mmol/L (ref 10.0–18.0)
BUN/Creatinine Ratio: 13 (ref 12–28)
BUN: 58 mg/dL — ABNORMAL HIGH (ref 8–27)
CO2: 25 mmol/L (ref 20–29)
Calcium: 8.9 mg/dL (ref 8.7–10.3)
Chloride: 105 mmol/L (ref 96–106)
Creatinine, Ser: 4.6 mg/dL — ABNORMAL HIGH (ref 0.57–1.00)
Glucose: 107 mg/dL — ABNORMAL HIGH (ref 70–99)
Potassium: 4.4 mmol/L (ref 3.5–5.2)
Sodium: 146 mmol/L — ABNORMAL HIGH (ref 134–144)
eGFR: 9 mL/min/{1.73_m2} — ABNORMAL LOW (ref >59)

## 2022-04-15 ENCOUNTER — Emergency Department (HOSPITAL_COMMUNITY): Payer: Medicare PPO

## 2022-04-15 ENCOUNTER — Other Ambulatory Visit: Payer: Self-pay

## 2022-04-15 ENCOUNTER — Encounter (HOSPITAL_COMMUNITY): Payer: Self-pay | Admitting: *Deleted

## 2022-04-15 ENCOUNTER — Inpatient Hospital Stay (HOSPITAL_COMMUNITY)
Admission: EM | Admit: 2022-04-15 | Discharge: 2022-04-22 | DRG: 065 | Disposition: A | Discharge status: Rehabilitation Facility | Payer: Medicare PPO | Attending: Internal Medicine | Admitting: Internal Medicine

## 2022-04-15 DIAGNOSIS — E785 Hyperlipidemia, unspecified: Secondary | ICD-10-CM | POA: Diagnosis present

## 2022-04-15 DIAGNOSIS — I10 Essential (primary) hypertension: Secondary | ICD-10-CM | POA: Diagnosis present

## 2022-04-15 DIAGNOSIS — G8314 Monoplegia of lower limb affecting left nondominant side: Secondary | ICD-10-CM | POA: Diagnosis present

## 2022-04-15 DIAGNOSIS — Z888 Allergy status to other drugs, medicaments and biological substances status: Secondary | ICD-10-CM

## 2022-04-15 DIAGNOSIS — I69354 Hemiplegia and hemiparesis following cerebral infarction affecting left non-dominant side: Secondary | ICD-10-CM | POA: Diagnosis present

## 2022-04-15 DIAGNOSIS — I639 Cerebral infarction, unspecified: Secondary | ICD-10-CM | POA: Diagnosis not present

## 2022-04-15 DIAGNOSIS — I340 Nonrheumatic mitral (valve) insufficiency: Secondary | ICD-10-CM | POA: Diagnosis present

## 2022-04-15 DIAGNOSIS — N185 Chronic kidney disease, stage 5: Secondary | ICD-10-CM | POA: Diagnosis present

## 2022-04-15 DIAGNOSIS — I6359 Cerebral infarction due to unspecified occlusion or stenosis of other cerebral artery: Secondary | ICD-10-CM | POA: Diagnosis not present

## 2022-04-15 DIAGNOSIS — Z833 Family history of diabetes mellitus: Secondary | ICD-10-CM | POA: Diagnosis not present

## 2022-04-15 DIAGNOSIS — E1140 Type 2 diabetes mellitus with diabetic neuropathy, unspecified: Secondary | ICD-10-CM | POA: Diagnosis present

## 2022-04-15 DIAGNOSIS — D649 Anemia, unspecified: Secondary | ICD-10-CM | POA: Diagnosis not present

## 2022-04-15 DIAGNOSIS — E669 Obesity, unspecified: Secondary | ICD-10-CM | POA: Diagnosis not present

## 2022-04-15 DIAGNOSIS — R4182 Altered mental status, unspecified: Secondary | ICD-10-CM | POA: Diagnosis not present

## 2022-04-15 DIAGNOSIS — I6381 Other cerebral infarction due to occlusion or stenosis of small artery: Secondary | ICD-10-CM | POA: Diagnosis present

## 2022-04-15 DIAGNOSIS — D631 Anemia in chronic kidney disease: Secondary | ICD-10-CM | POA: Diagnosis present

## 2022-04-15 DIAGNOSIS — Z86010 Personal history of colonic polyps: Secondary | ICD-10-CM | POA: Diagnosis not present

## 2022-04-15 DIAGNOSIS — R1312 Dysphagia, oropharyngeal phase: Secondary | ICD-10-CM | POA: Diagnosis not present

## 2022-04-15 DIAGNOSIS — R29810 Facial weakness: Secondary | ICD-10-CM | POA: Diagnosis present

## 2022-04-15 DIAGNOSIS — R1310 Dysphagia, unspecified: Secondary | ICD-10-CM | POA: Diagnosis not present

## 2022-04-15 DIAGNOSIS — Z803 Family history of malignant neoplasm of breast: Secondary | ICD-10-CM | POA: Diagnosis not present

## 2022-04-15 DIAGNOSIS — D509 Iron deficiency anemia, unspecified: Secondary | ICD-10-CM | POA: Diagnosis present

## 2022-04-15 DIAGNOSIS — E1169 Type 2 diabetes mellitus with other specified complication: Secondary | ICD-10-CM | POA: Diagnosis present

## 2022-04-15 DIAGNOSIS — M109 Gout, unspecified: Secondary | ICD-10-CM | POA: Diagnosis present

## 2022-04-15 DIAGNOSIS — E1122 Type 2 diabetes mellitus with diabetic chronic kidney disease: Secondary | ICD-10-CM | POA: Diagnosis present

## 2022-04-15 DIAGNOSIS — Z87891 Personal history of nicotine dependence: Secondary | ICD-10-CM | POA: Diagnosis not present

## 2022-04-15 DIAGNOSIS — I129 Hypertensive chronic kidney disease with stage 1 through stage 4 chronic kidney disease, or unspecified chronic kidney disease: Secondary | ICD-10-CM | POA: Diagnosis not present

## 2022-04-15 DIAGNOSIS — Z9109 Other allergy status, other than to drugs and biological substances: Secondary | ICD-10-CM

## 2022-04-15 DIAGNOSIS — I5032 Chronic diastolic (congestive) heart failure: Secondary | ICD-10-CM | POA: Diagnosis present

## 2022-04-15 DIAGNOSIS — I6930 Unspecified sequelae of cerebral infarction: Secondary | ICD-10-CM | POA: Diagnosis present

## 2022-04-15 DIAGNOSIS — M1030 Gout due to renal impairment, unspecified site: Secondary | ICD-10-CM | POA: Diagnosis present

## 2022-04-15 DIAGNOSIS — R0989 Other specified symptoms and signs involving the circulatory and respiratory systems: Secondary | ICD-10-CM | POA: Diagnosis not present

## 2022-04-15 DIAGNOSIS — I6389 Other cerebral infarction: Principal | ICD-10-CM | POA: Diagnosis present

## 2022-04-15 DIAGNOSIS — I69398 Other sequelae of cerebral infarction: Secondary | ICD-10-CM | POA: Diagnosis not present

## 2022-04-15 DIAGNOSIS — R29703 NIHSS score 3: Secondary | ICD-10-CM | POA: Diagnosis present

## 2022-04-15 DIAGNOSIS — Z79899 Other long term (current) drug therapy: Secondary | ICD-10-CM

## 2022-04-15 DIAGNOSIS — I132 Hypertensive heart and chronic kidney disease with heart failure and with stage 5 chronic kidney disease, or end stage renal disease: Secondary | ICD-10-CM | POA: Diagnosis present

## 2022-04-15 DIAGNOSIS — G8324 Monoplegia of upper limb affecting left nondominant side: Secondary | ICD-10-CM | POA: Diagnosis present

## 2022-04-15 DIAGNOSIS — R0600 Dyspnea, unspecified: Secondary | ICD-10-CM | POA: Diagnosis not present

## 2022-04-15 DIAGNOSIS — R471 Dysarthria and anarthria: Secondary | ICD-10-CM | POA: Diagnosis present

## 2022-04-15 DIAGNOSIS — N189 Chronic kidney disease, unspecified: Secondary | ICD-10-CM | POA: Diagnosis present

## 2022-04-15 DIAGNOSIS — N184 Chronic kidney disease, stage 4 (severe): Secondary | ICD-10-CM | POA: Diagnosis not present

## 2022-04-15 DIAGNOSIS — I120 Hypertensive chronic kidney disease with stage 5 chronic kidney disease or end stage renal disease: Secondary | ICD-10-CM | POA: Diagnosis not present

## 2022-04-15 DIAGNOSIS — M10372 Gout due to renal impairment, left ankle and foot: Secondary | ICD-10-CM | POA: Diagnosis not present

## 2022-04-15 DIAGNOSIS — I1311 Hypertensive heart and chronic kidney disease without heart failure, with stage 5 chronic kidney disease, or end stage renal disease: Secondary | ICD-10-CM | POA: Diagnosis not present

## 2022-04-15 LAB — DIFFERENTIAL
Abs Immature Granulocytes: 0.02 10*3/uL (ref 0.00–0.07)
Basophils Absolute: 0 10*3/uL (ref 0.0–0.1)
Basophils Relative: 0 %
Eosinophils Absolute: 0.3 10*3/uL (ref 0.0–0.5)
Eosinophils Relative: 4 %
Immature Granulocytes: 0 %
Lymphocytes Relative: 29 %
Lymphs Abs: 2.2 10*3/uL (ref 0.7–4.0)
Monocytes Absolute: 0.7 10*3/uL (ref 0.1–1.0)
Monocytes Relative: 9 %
Neutro Abs: 4.3 10*3/uL (ref 1.7–7.7)
Neutrophils Relative %: 58 %

## 2022-04-15 LAB — CBC
HCT: 29.4 % — ABNORMAL LOW (ref 36.0–46.0)
Hemoglobin: 8.8 g/dL — ABNORMAL LOW (ref 12.0–15.0)
MCH: 26.1 pg (ref 26.0–34.0)
MCHC: 29.9 g/dL — ABNORMAL LOW (ref 30.0–36.0)
MCV: 87.2 fL (ref 80.0–100.0)
Platelets: 345 10*3/uL (ref 150–400)
RBC: 3.37 MIL/uL — ABNORMAL LOW (ref 3.87–5.11)
RDW: 16.1 % — ABNORMAL HIGH (ref 11.5–15.5)
WBC: 7.6 10*3/uL (ref 4.0–10.5)
nRBC: 0 % (ref 0.0–0.2)

## 2022-04-15 LAB — COMPREHENSIVE METABOLIC PANEL
ALT: 11 U/L (ref 0–44)
AST: 15 U/L (ref 15–41)
Albumin: 3.5 g/dL (ref 3.5–5.0)
Alkaline Phosphatase: 56 U/L (ref 38–126)
Anion gap: 8 (ref 5–15)
BUN: 51 mg/dL — ABNORMAL HIGH (ref 8–23)
CO2: 26 mmol/L (ref 22–32)
Calcium: 8.5 mg/dL — ABNORMAL LOW (ref 8.9–10.3)
Chloride: 109 mmol/L (ref 98–111)
Creatinine, Ser: 5.18 mg/dL — ABNORMAL HIGH (ref 0.44–1.00)
GFR, Estimated: 8 mL/min — ABNORMAL LOW (ref >60)
Glucose, Bld: 109 mg/dL — ABNORMAL HIGH (ref 70–99)
Potassium: 4.3 mmol/L (ref 3.5–5.1)
Sodium: 143 mmol/L (ref 135–145)
Total Bilirubin: 0.4 mg/dL (ref 0.3–1.2)
Total Protein: 7.1 g/dL (ref 6.5–8.1)

## 2022-04-15 LAB — I-STAT VENOUS BLOOD GAS, ED
Acid-Base Excess: 1 mmol/L (ref 0.0–2.0)
Bicarbonate: 25.8 mmol/L (ref 20.0–28.0)
Calcium, Ion: 1.07 mmol/L — ABNORMAL LOW (ref 1.15–1.40)
HCT: 27 % — ABNORMAL LOW (ref 36.0–46.0)
Hemoglobin: 9.2 g/dL — ABNORMAL LOW (ref 12.0–15.0)
O2 Saturation: 85 %
Potassium: 4.3 mmol/L (ref 3.5–5.1)
Sodium: 141 mmol/L (ref 135–145)
TCO2: 27 mmol/L (ref 22–32)
pCO2, Ven: 39.6 mmHg — ABNORMAL LOW (ref 44–60)
pH, Ven: 7.422 (ref 7.25–7.43)
pO2, Ven: 50 mmHg — ABNORMAL HIGH (ref 32–45)

## 2022-04-15 LAB — PROTIME-INR
INR: 1.1 (ref 0.8–1.2)
Prothrombin Time: 13.6 seconds (ref 11.4–15.2)

## 2022-04-15 LAB — APTT: aPTT: 30 seconds (ref 24–36)

## 2022-04-15 NOTE — ED Provider Triage Note (Signed)
Emergency Medicine Provider Triage Evaluation Note  Gina Bailey , a 76 y.o. female  was evaluated in triage.  Pt complains of new onset left-sided facial droop.  Patient last known normal was 7 PM before bed last night.  Her son noticed that she had left-sided facial weakness and called 911.  She has no complaints.  She was able to ambulate without ataxia.  She denies headache difficulty with speech..  Review of Systems  Positive: Left-sided facial droop Negative: Fever  Physical Exam  BP (!) 154/71   Pulse 82   Temp 99 F (37.2 C) (Oral)   Resp 16   Ht '5\' 5"'$  (1.651 m)   Wt 79.2 kg   LMP 10/22/1978   SpO2 99%   BMI 29.06 kg/m  Gen:   Awake, no distress   Resp:  Normal effort  MSK:   Moves extremities without difficulty  Other:  Obvious left-sided facial weakness  Medical Decision Making  Medically screening exam initiated at 3:26 PM.  Appropriate orders placed.  Gina Bailey was informed that the remainder of the evaluation will be completed by another provider, this initial triage assessment does not replace that evaluation, and the importance of remaining in the ED until their evaluation is complete.  Work-up initiated.  She is LVO negative   Margarita Mail, PA-C 04/15/22 1529

## 2022-04-15 NOTE — ED Provider Notes (Signed)
[facility]PART[facility] Provider Note   CSN: [id number] Arrival date & time: 04/15/22  1430     History {Add pertinent medical, surgical, social history, OB history to HPI:1} Chief Complaint  Patient presents with   Altered Mental Status    Gina Bailey is a 76 y.o. female.  The history is provided by the patient and medical records.  Altered Mental Status  76 y.o. F with hx of HTN, ventral hernia, CKD, seasonal allergies, HLD, DM, presenting to the ED for stroke like symptoms.  Per family, LKW 67 yesterday evening.  Family reports they noticed her symptoms today around 11am.  Per son, left sided facial droop, speech seems slurred from baseline, and she seems a bit weak.  She has no prior hx of TIA or stroke in the past.  She has not had any falls or trauma.  Generally walks unassisted without cane/walker.  Patient without any current complaints aside from being hungry.  Home Medications Prior to Admission medications   Medication Sig Start Date End Date Taking? Authorizing Provider  Accu-Chek FastClix Lancets MISC USE ONCE DAILY AS DIRECTED TO MONITOR BLOOD SUGAR 04/18/21   Angelica Pou, MD  amLODipine (NORVASC) 10 MG tablet Take 1 tablet (10 mg total) by mouth daily. 04/14/21   Angelica Pou, MD  Blood Glucose Monitoring Suppl ([place]) w/Device KIT 1 each by Does not apply route 1 day or 1 dose. Uses to check blood sugar once daily in the morning. 12/26/19   Mosetta Anis, MD  calcitRIOL (ROCALTROL) 0.25 MCG capsule Take 0.25 mcg by mouth 3 (three) times a week.    [provider]  carvedilol (COREG) 6.25 MG tablet Take 1 tablet (6.25 mg total) by mouth 2 (two) times daily. 12/23/20 04/09/22  Angelica Pou, MD  furosemide (LASIX) 40 MG tablet Take 2 tabs every morning and 1 tab every afternoon or as directed 04/09/22   Angelica Pou, MD  glucose blood (ACCU-CHEK GUIDE) test strip Check blood sugar 7 times  a week. diag code E11.9. Non insulin dependent 01/28/21   Angelica Pou, MD  potassium chloride (MICRO-K) 10 MEQ CR capsule Take 10 mEq by mouth daily. 04/06/22   [provider]      Allergies    Metformin and related, Pineapple, and Zestril [lisinopril]    Review of Systems   Review of Systems  Neurological:  Positive for facial asymmetry.  All other systems reviewed and are negative.   Physical Exam Updated Vital Signs BP (!) 161/72   Pulse 72   Temp 98.1 F (36.7 C) (Oral)   Resp 15   Ht '5\' 5"'  (1.651 m)   Wt 79.2 kg   LMP 10/22/1978   SpO2 100%   BMI 29.06 kg/m   Physical Exam Vitals and nursing note reviewed.  Constitutional:      Appearance: She is well-developed.  HENT:     Head: Normocephalic and atraumatic.  Eyes:     Conjunctiva/sclera: Conjunctivae normal.     Pupils: Pupils are equal, round, and reactive to light.  Cardiovascular:     Rate and Rhythm: Normal rate and regular rhythm.     Heart sounds: Normal heart sounds.  Pulmonary:     Effort: Pulmonary effort is normal.     Breath sounds: Normal breath sounds.  Abdominal:     General: Bowel sounds are normal.     Palpations: Abdomen is soft.  Musculoskeletal:  General: Normal range of motion.     Cervical back: Normal range of motion.  Skin:    General: Skin is warm and dry.  Neurological:     Mental Status: She is alert and oriented to person, place, and time.     Comments: Awake, alert, oriented, able to answer questions and follow commands, apparent left sided facial droop on exam with blunting around nasolabial fold, 4/5 strength LUE, otherwise 5/5 strength other extremities, speech does seem slightly slurred (family at bedside agrees)     ED Results / Procedures / Treatments   Labs (all labs ordered are listed, but only abnormal results are displayed) Labs Reviewed  CBC - Abnormal; Notable for the following components:      Result Value   RBC 3.37 (*)    Hemoglobin  8.8 (*)    HCT 29.4 (*)    MCHC 29.9 (*)    RDW 16.1 (*)    All other components within normal limits  COMPREHENSIVE METABOLIC PANEL - Abnormal; Notable for the following components:   Glucose, Bld 109 (*)    BUN 51 (*)    Creatinine, Ser 5.18 (*)    Calcium 8.5 (*)    GFR, Estimated 8 (*)    All other components within normal limits  I-STAT VENOUS BLOOD GAS, ED - Abnormal; Notable for the following components:   pCO2, Ven 39.6 (*)    pO2, Ven 50 (*)    Calcium, Ion 1.07 (*)    HCT 27.0 (*)    Hemoglobin 9.2 (*)    All other components within normal limits  RESP PANEL BY RT-PCR (FLU A&B, COVID) ARPGX2  APTT  DIFFERENTIAL  PROTIME-INR  ETHANOL  RAPID URINE DRUG SCREEN, HOSP PERFORMED  URINALYSIS, ROUTINE W REFLEX MICROSCOPIC  I-STAT CHEM 8, ED    EKG None  Radiology CT HEAD WO CONTRAST  Result Date: 04/15/2022 CLINICAL DATA:  Left-sided facial droop EXAM: CT HEAD WITHOUT CONTRAST TECHNIQUE: Contiguous axial images were obtained from the base of the skull through the vertex without intravenous contrast. RADIATION DOSE REDUCTION: This exam was performed according to the departmental dose-optimization program which includes automated exposure control, adjustment of the mA and/or kV according to patient size and/or use of iterative reconstruction technique. COMPARISON:  None Available. FINDINGS: Brain: No acute territorial infarction, hemorrhage or intracranial mass. Moderate white matter hypodensity likely chronic small vessel ischemic change. 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 Nonenlarged ventricles Vascular: No hyperdense vessels.  Carotid vascular calcification Skull: Normal. Negative for fracture or focal lesion. Sinuses/Orbits: No acute finding.  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 No CT evidence for acute intracranial abnormality. 2. Chronic small vessel ischemic changes of the white matter Electronically Signed   By: Donavan Foil M.D.   On: 04/15/2022 17:03    Procedures Procedures  {Document cardiac monitor, telemetry assessment procedure when appropriate:1}  Medications Ordered in ED Medications - No data to display  ED Course/ Medical Decision Making/ A&P                           Medical Decision Making Amount and/or Complexity of Data Reviewed Radiology: ordered.   ***  {Document critical care time when appropriate:1} {Document review of labs and clinical decision tools ie heart score, Chads2Vasc2 etc:1}  {Document your independent review of radiology images, and any outside records:1} {Document your discussion with family members, caretakers, and with  consultants:1} {Document social determinants of health affecting pt's care:1} {Document your decision making why or why not admission, treatments were needed:1} Final Clinical Impression(s) / ED Diagnoses Final diagnoses:  None    Rx / DC Orders ED Discharge Orders     None

## 2022-04-15 NOTE — ED Triage Notes (Signed)
Patient BIB [facility] from home for evaluation of left sided facial droop, LKW 1900 yesterday. No arm or leg drift, patient reportedly ambulated independently to EMS stretcher with steady gait. Patient is alert, oriented, and in no apparent distress.

## 2022-04-15 NOTE — ED Triage Notes (Signed)
The pt is a and o x4 she cannot tell me anything except that everyone thinks that she has had a stroke

## 2022-04-16 ENCOUNTER — Inpatient Hospital Stay (HOSPITAL_COMMUNITY): Payer: Medicare PPO

## 2022-04-16 ENCOUNTER — Emergency Department (HOSPITAL_COMMUNITY): Payer: Medicare PPO

## 2022-04-16 DIAGNOSIS — E1140 Type 2 diabetes mellitus with diabetic neuropathy, unspecified: Secondary | ICD-10-CM | POA: Diagnosis present

## 2022-04-16 DIAGNOSIS — Z9109 Other allergy status, other than to drugs and biological substances: Secondary | ICD-10-CM | POA: Diagnosis not present

## 2022-04-16 DIAGNOSIS — I69354 Hemiplegia and hemiparesis following cerebral infarction affecting left non-dominant side: Secondary | ICD-10-CM | POA: Insufficient documentation

## 2022-04-16 DIAGNOSIS — M109 Gout, unspecified: Secondary | ICD-10-CM | POA: Diagnosis present

## 2022-04-16 DIAGNOSIS — Z87891 Personal history of nicotine dependence: Secondary | ICD-10-CM | POA: Diagnosis not present

## 2022-04-16 DIAGNOSIS — I340 Nonrheumatic mitral (valve) insufficiency: Secondary | ICD-10-CM | POA: Diagnosis present

## 2022-04-16 DIAGNOSIS — D631 Anemia in chronic kidney disease: Secondary | ICD-10-CM | POA: Diagnosis present

## 2022-04-16 DIAGNOSIS — E1169 Type 2 diabetes mellitus with other specified complication: Secondary | ICD-10-CM

## 2022-04-16 DIAGNOSIS — R29810 Facial weakness: Secondary | ICD-10-CM | POA: Diagnosis present

## 2022-04-16 DIAGNOSIS — R29703 NIHSS score 3: Secondary | ICD-10-CM | POA: Diagnosis present

## 2022-04-16 DIAGNOSIS — Z888 Allergy status to other drugs, medicaments and biological substances status: Secondary | ICD-10-CM | POA: Diagnosis not present

## 2022-04-16 DIAGNOSIS — N185 Chronic kidney disease, stage 5: Secondary | ICD-10-CM | POA: Diagnosis present

## 2022-04-16 DIAGNOSIS — I6389 Other cerebral infarction: Secondary | ICD-10-CM

## 2022-04-16 DIAGNOSIS — R471 Dysarthria and anarthria: Secondary | ICD-10-CM | POA: Diagnosis present

## 2022-04-16 DIAGNOSIS — D649 Anemia, unspecified: Secondary | ICD-10-CM | POA: Diagnosis not present

## 2022-04-16 DIAGNOSIS — I10 Essential (primary) hypertension: Secondary | ICD-10-CM

## 2022-04-16 DIAGNOSIS — R1310 Dysphagia, unspecified: Secondary | ICD-10-CM | POA: Diagnosis not present

## 2022-04-16 DIAGNOSIS — I6381 Other cerebral infarction due to occlusion or stenosis of small artery: Secondary | ICD-10-CM | POA: Diagnosis present

## 2022-04-16 DIAGNOSIS — Z833 Family history of diabetes mellitus: Secondary | ICD-10-CM | POA: Diagnosis not present

## 2022-04-16 DIAGNOSIS — D509 Iron deficiency anemia, unspecified: Secondary | ICD-10-CM | POA: Diagnosis present

## 2022-04-16 DIAGNOSIS — E1122 Type 2 diabetes mellitus with diabetic chronic kidney disease: Secondary | ICD-10-CM | POA: Diagnosis present

## 2022-04-16 DIAGNOSIS — Z803 Family history of malignant neoplasm of breast: Secondary | ICD-10-CM | POA: Diagnosis not present

## 2022-04-16 DIAGNOSIS — I5032 Chronic diastolic (congestive) heart failure: Secondary | ICD-10-CM | POA: Diagnosis present

## 2022-04-16 DIAGNOSIS — R4182 Altered mental status, unspecified: Secondary | ICD-10-CM | POA: Diagnosis not present

## 2022-04-16 DIAGNOSIS — R1312 Dysphagia, oropharyngeal phase: Secondary | ICD-10-CM | POA: Diagnosis not present

## 2022-04-16 DIAGNOSIS — R0989 Other specified symptoms and signs involving the circulatory and respiratory systems: Secondary | ICD-10-CM | POA: Diagnosis not present

## 2022-04-16 DIAGNOSIS — E785 Hyperlipidemia, unspecified: Secondary | ICD-10-CM

## 2022-04-16 DIAGNOSIS — M10372 Gout due to renal impairment, left ankle and foot: Secondary | ICD-10-CM | POA: Diagnosis not present

## 2022-04-16 DIAGNOSIS — Z86010 Personal history of colonic polyps: Secondary | ICD-10-CM | POA: Diagnosis not present

## 2022-04-16 DIAGNOSIS — I120 Hypertensive chronic kidney disease with stage 5 chronic kidney disease or end stage renal disease: Secondary | ICD-10-CM | POA: Diagnosis not present

## 2022-04-16 DIAGNOSIS — G8324 Monoplegia of upper limb affecting left nondominant side: Secondary | ICD-10-CM | POA: Diagnosis present

## 2022-04-16 DIAGNOSIS — I6930 Unspecified sequelae of cerebral infarction: Secondary | ICD-10-CM | POA: Diagnosis present

## 2022-04-16 DIAGNOSIS — I639 Cerebral infarction, unspecified: Secondary | ICD-10-CM

## 2022-04-16 DIAGNOSIS — G8314 Monoplegia of lower limb affecting left nondominant side: Secondary | ICD-10-CM | POA: Diagnosis present

## 2022-04-16 DIAGNOSIS — I132 Hypertensive heart and chronic kidney disease with heart failure and with stage 5 chronic kidney disease, or end stage renal disease: Secondary | ICD-10-CM | POA: Diagnosis present

## 2022-04-16 DIAGNOSIS — N186 End stage renal disease: Secondary | ICD-10-CM | POA: Insufficient documentation

## 2022-04-16 LAB — RAPID URINE DRUG SCREEN, HOSP PERFORMED
Amphetamines: NOT DETECTED
Barbiturates: NOT DETECTED
Benzodiazepines: NOT DETECTED
Cocaine: NOT DETECTED
Opiates: NOT DETECTED
Tetrahydrocannabinol: NOT DETECTED

## 2022-04-16 LAB — URINALYSIS, ROUTINE W REFLEX MICROSCOPIC
Bacteria, UA: NONE SEEN
Bilirubin Urine: NEGATIVE
Glucose, UA: NEGATIVE mg/dL
Hgb urine dipstick: NEGATIVE
Ketones, ur: NEGATIVE mg/dL
Leukocytes,Ua: NEGATIVE
Nitrite: NEGATIVE
Protein, ur: 100 mg/dL — AB
Specific Gravity, Urine: 1.006 (ref 1.005–1.030)
pH: 7 (ref 5.0–8.0)

## 2022-04-16 LAB — TSH: TSH: 1.669 u[IU]/mL (ref 0.350–4.500)

## 2022-04-16 LAB — ECHOCARDIOGRAM COMPLETE BUBBLE STUDY
Area-P 1/2: 4.39 cm2
Calc EF: 58.1 %
MV M vel: 3.73 m/s
MV Peak grad: 55.8 mmHg
S' Lateral: 3 cm
Single Plane A2C EF: 55 %
Single Plane A4C EF: 58.7 %

## 2022-04-16 MED ORDER — ACETAMINOPHEN 650 MG RE SUPP: 650.0000 mg | RECTAL | Status: DC | PRN

## 2022-04-16 MED ORDER — HEPARIN SODIUM (PORCINE) 5000 UNIT/ML IJ SOLN
5000.0000 [IU] | Freq: Three times a day (TID) | INTRAMUSCULAR | Status: DC
  Administered 2022-04-16 – 2022-04-22 (×20): 5000 [IU] via SUBCUTANEOUS
  Filled 2022-04-16 (×19): qty 1

## 2022-04-16 MED ORDER — ROSUVASTATIN CALCIUM 5 MG PO TABS
10.0000 mg | ORAL_TABLET | Freq: Every day | ORAL | Status: DC
  Administered 2022-04-16: 10 mg via ORAL
  Filled 2022-04-16: qty 2

## 2022-04-16 MED ORDER — ACETAMINOPHEN 160 MG/5ML PO SOLN
650.0000 mg | ORAL | Status: DC | PRN
  Administered 2022-04-20: 650 mg
  Filled 2022-04-16: qty 20.3

## 2022-04-16 MED ORDER — POTASSIUM CHLORIDE CRYS ER 10 MEQ PO TBCR
10.0000 meq | EXTENDED_RELEASE_TABLET | Freq: Every day | ORAL | Status: DC
  Administered 2022-04-16: 10 meq via ORAL
  Filled 2022-04-16: qty 1

## 2022-04-16 MED ORDER — CLOPIDOGREL BISULFATE 75 MG PO TABS
75.0000 mg | ORAL_TABLET | Freq: Every day | ORAL | Status: DC
  Administered 2022-04-17: 75 mg via ORAL
  Filled 2022-04-16: qty 1

## 2022-04-16 MED ORDER — STROKE: EARLY STAGES OF RECOVERY BOOK
Freq: Once | Status: AC
  Filled 2022-04-16: qty 1

## 2022-04-16 MED ORDER — ACETAMINOPHEN 325 MG PO TABS
650.0000 mg | ORAL_TABLET | ORAL | Status: DC | PRN
  Administered 2022-04-20 – 2022-04-22 (×3): 650 mg via ORAL
  Filled 2022-04-16 (×3): qty 2

## 2022-04-16 MED ORDER — CLOPIDOGREL BISULFATE 300 MG PO TABS
300.0000 mg | ORAL_TABLET | Freq: Once | ORAL | Status: AC
  Administered 2022-04-16: 300 mg via ORAL
  Filled 2022-04-16: qty 1

## 2022-04-16 MED ORDER — ROSUVASTATIN CALCIUM 20 MG PO TABS
20.0000 mg | ORAL_TABLET | Freq: Every day | ORAL | Status: DC
  Administered 2022-04-17 – 2022-04-22 (×6): 20 mg via ORAL
  Filled 2022-04-16 (×6): qty 1

## 2022-04-16 MED ORDER — SENNOSIDES-DOCUSATE SODIUM 8.6-50 MG PO TABS
1.0000 | ORAL_TABLET | Freq: Every evening | ORAL | Status: DC | PRN
  Administered 2022-04-20: 1 via ORAL
  Filled 2022-04-16 (×2): qty 1

## 2022-04-16 MED ORDER — CALCITRIOL 0.25 MCG PO CAPS
0.2500 ug | ORAL_CAPSULE | ORAL | Status: DC
  Administered 2022-04-17 – 2022-04-22 (×3): 0.25 ug via ORAL
  Filled 2022-04-16 (×4): qty 1

## 2022-04-16 MED ORDER — ASPIRIN 81 MG PO TBEC
81.0000 mg | DELAYED_RELEASE_TABLET | Freq: Every day | ORAL | Status: DC
  Administered 2022-04-16 – 2022-04-22 (×7): 81 mg via ORAL
  Filled 2022-04-16 (×7): qty 1

## 2022-04-16 MED ORDER — FUROSEMIDE 40 MG PO TABS
80.0000 mg | ORAL_TABLET | Freq: Every day | ORAL | Status: DC
  Administered 2022-04-16 – 2022-04-22 (×7): 80 mg via ORAL
  Filled 2022-04-16 (×3): qty 2
  Filled 2022-04-16: qty 4
  Filled 2022-04-16 (×3): qty 2

## 2022-04-16 MED ORDER — CARBAMIDE PEROXIDE 6.5 % OT SOLN
5.0000 [drp] | Freq: Two times a day (BID) | OTIC | Status: AC
  Administered 2022-04-16 – 2022-04-18 (×5): 5 [drp] via OTIC
  Filled 2022-04-16: qty 15

## 2022-04-16 NOTE — Progress Notes (Signed)
  Echocardiogram 2D Echocardiogram has been performed.  Gina Bailey 04/16/2022, 10:20 AM

## 2022-04-16 NOTE — ED Notes (Signed)
Patient to bed 49, awaiting admission to hospital with family. Family not happy at new room.

## 2022-04-16 NOTE — ED Notes (Signed)
Patient given a Purewick.

## 2022-04-16 NOTE — ED Notes (Signed)
Echo at bedside

## 2022-04-16 NOTE — H&P (Addendum)
Date: 04/16/2022               Patient Name:  Gina Bailey MRN: [id number]  DOB: 11-26-1945 Age / Sex: 76 y.o., female   PCP: Angelica Pou, MD         Medical Service: Internal Medicine Teaching Service         Attending Physician: : Dr. Angelia Mould    First Contact: Stormy Fabian, MD      Pager: Liliane Shi [phone]      Second Contact: Lorine Bears      Pager: Liliane Shi [phone]           After Hours (After 5p/  First Contact Pager: [phone]  weekends / holidays): Second Contact Pager: [phone]   SUBJECTIVE   Chief Complaint: L sided facial droop  History of Present Illness:  [name] is a 76yo woman with a history of T2DM with neuropathy, poorly controlled HTN, ESRD with AVF LUE, HLD, former smoker, MR, diastolic HF presenting ED for stroke-like symptoms  Patient lives with son and was noted to be at her baseline around [facility] on Tuesday 9/26. Son returned home at 11PM; at the time, patient was perceived to have mumbled speech  thought to be because she had been sleeping. Patient's daughter saw her on 05/04/23 ~  9:30AM with pronounced L facial droop. Patient's son took a picture and sent it to his wife, an Therapist, sports, who recommended he calls 911 due to concern for stroke.and 911 was called. Patient currently denies CP,  SOB, palpitations, changes in vision, or lightheadedness She denies pain. She is hungry. Patient is still able to ambulate.   Per family, patient had not been taking her meds for months and were unaware about uncontrolled BP and need for nephrology follow up until recent nephrology visit. They have assumed care of her medications for the past three months. Son reports she has been compliant ever since with blood pressure medications, kidney vitamins, and furosemide for leg swelling.  ED Course: Patient found to be A&O x3 with L facial droop, dysarthic, and with L upper extremity weakness. Labs at her baseline. EKG with nsr, and CT without intracranial  abnormalities. MRA brain and cervical significant for acute infarct in R posterior limb of the IC and inferior posterior R lentiform nucleus.  Acute infarct in the occipital lobe periventricular white matter posterior to the L occipital horn. MR brain with limited diffusion in the areas corresponding with infarct. Neurology was consulted and patient was admitted to IMTS for further management.  Meds:  Current Meds  Medication Sig   amLODipine (NORVASC) 10 MG tablet Take 1 tablet (10 mg total) by mouth daily.   calcitRIOL (ROCALTROL) 0.25 MCG capsule Take 0.25 mcg by mouth 3 (three) times a week.   carvedilol (COREG) 6.25 MG tablet Take 1 tablet (6.25 mg total) by mouth 2 (two) times daily.   furosemide (LASIX) 40 MG tablet Take 2 tabs every morning and 1 tab every afternoon or as directed (Patient taking differently: Take 80 mg by mouth daily.)   potassium chloride (MICRO-K) 10 MEQ CR capsule Take 10 mEq by mouth daily.    Past Medical History  Past Surgical History:  Procedure Laterality Date   APPENDECTOMY     AV FISTULA PLACEMENT Left 03/10/2022   Procedure: LEFT ARM ARTERIOVENOUS (AV) FISTULA CREATION VERSUS GRAFT;  Surgeon: Waynetta Sandy, MD;  Location: [place];  Service: Vascular;  Laterality: Left;   COLON SURGERY  08/2006  Partial right colectomy and anastomosis with resection of villous adenoma.   HEMORRHOID SURGERY     hemorrhoidectomy   TONSILLECTOMY     TUBAL LIGATION     VENTRAL HERNIA REPAIR  08/2008   By Dr. Hulen Skains.    Social:  Lives With: son [name] in [place], [place] Occupation: Former Education officer, community, now retired Support: Son [name], his wife, who is an Therapist, sports, and daughter Alleen Borne Level of Function: independent with ADLs, ambulates without assistive devices, though has been unstable on her feet lately, is continent, dependent for iADLs. Does not drive PCP: Dr Dorian Pod Substances: Former smoker. No alcohol use.  Family History: N/A  Allergies: Allergies as  of 04/15/2022 - Review Complete 04/15/2022  Allergen Reaction Noted   Metformin and related Other (See Comments) 06/13/2019   Pineapple Itching and Swelling 09/04/2010   Zestril [lisinopril] Swelling 08/31/2016    Review of Systems: A complete ROS was negative except as per HPI.   OBJECTIVE:   Physical Exam: Blood pressure 137/67, pulse 71, temperature 98.1 F (36.7 C), temperature source Oral, resp. rate 19, height '5\' 5"'$  (1.651 m), weight 79.2 kg, last menstrual period 10/22/1978, SpO2 95 %.  Constitutional: well-appearing woman sitting in bed, in no acute distress HENT: normocephalic atraumatic, mucous membranes moist, partial dentition Cardiovascular: regular rate and rhythm, no m/r/g, no JVD. LUE AVF with good thrill and +bruit. Radial pulses 2+ bilaterally and symmetrical Pulmonary/Chest: normal work of breathing on room air, lungs clear to auscultation bilaterally. No crackles  Abdominal: soft, non-tender, non-distended.  Neurological: alert & oriented x 3, able to understand and follow commands, dysarthric speech, L facial droop, with symmetrical sensation bilaterally. 5/5 strength in bilateral upper and lower extremities. Normal FTN. MSK: no gross abnormalities. 1+ pitting edema Skin: warm and dry Psych: Appropriate mood and affect  Labs: CBC    Component Value Date/Time   WBC 7.6 04/15/2022 1534   RBC 3.37 (L) 04/15/2022 1534   HGB 9.2 (L) 04/15/2022 1548   HGB 13.0 11/27/2015 1400   HCT 27.0 (L) 04/15/2022 1548   HCT 40.4 11/27/2015 1400   PLT 345 04/15/2022 1534   PLT 254 11/27/2015 1400   MCV 87.2 04/15/2022 1534   MCV 85 11/27/2015 1400   MCH 26.1 04/15/2022 1534   MCHC 29.9 (L) 04/15/2022 1534   RDW 16.1 (H) 04/15/2022 1534   RDW 14.9 11/27/2015 1400   LYMPHSABS 2.2 04/15/2022 1534   MONOABS 0.7 04/15/2022 1534   EOSABS 0.3 04/15/2022 1534   BASOSABS 0.0 04/15/2022 1534     CMP     Component Value Date/Time   NA 141 04/15/2022 1548   NA 146 (H)  04/09/2022 0945   K 4.3 04/15/2022 1548   CL 109 04/15/2022 1534   CO2 26 04/15/2022 1534   GLUCOSE 109 (H) 04/15/2022 1534   BUN 51 (H) 04/15/2022 1534   BUN 58 (H) 04/09/2022 0945   CREATININE 5.18 (H) 04/15/2022 1534   CREATININE 1.09 11/20/2014 1636   CALCIUM 8.5 (L) 04/15/2022 1534   PROT 7.1 04/15/2022 1534   PROT 7.2 08/19/2020 1021   ALBUMIN 3.5 04/15/2022 1534   ALBUMIN 4.1 08/19/2020 1021   AST 15 04/15/2022 1534   ALT 11 04/15/2022 1534   ALKPHOS 56 04/15/2022 1534   BILITOT 0.4 04/15/2022 1534   BILITOT 0.3 08/19/2020 1021   GFRNONAA 8 (L) 04/15/2022 1534   GFRNONAA 52 (L) 11/20/2014 1636   GFRAA 32 (L) 08/19/2020 1021   GFRAA 59 (L) 11/20/2014 1636  Imaging: MR BRAIN WO CONTRAST  Result Date: 04/16/2022 CLINICAL DATA:  Left-sided facial droop EXAM: MRI HEAD WITHOUT CONTRAST MRA HEAD WITHOUT CONTRAST MRA NECK WITHOUT CONTRAST TECHNIQUE: Multiplanar, multi-echo pulse sequences of the brain and surrounding structures were acquired without intravenous contrast. Angiographic images of the Circle of Willis were acquired using MRA technique without intravenous contrast. Angiographic images of the neck were acquired using MRA technique without intravenous contrast. Carotid stenosis measurements (when applicable) are obtained utilizing NASCET criteria, using the distal internal carotid diameter as the denominator. COMPARISON:  No prior MRI, correlation is made with CT head 04/15/2022 FINDINGS: MRI HEAD FINDINGS Evaluation is limited by motion. Brain: Restricted diffusion with ADC correlate in the right posterior limb of the internal capsule (series 5, image 78) and inferior posterior right lentiform nucleus (series 5, image 76). Additional small area of restricted diffusion with ADC correlate posterior to the left occipital horn in the left occipital lobe periventricular white matter. No acute hemorrhage, mass, mass effect, or midline shift. No hydrocephalus or extra-axial  collection. Confluent T2 hyperintense signal in the periventricular white matter and pons, likely the sequela of moderate to severe chronic small vessel ischemic disease. Remote lacunar infarcts in the right thalamus, bilateral basal ganglia, and right cerebellum. No hemosiderin deposition to suggest remote hemorrhage. Vascular: Please see MRA findings below. Skull and upper cervical spine: Normal marrow signal. Sinuses/Orbits: No acute finding. Other: The mastoids are well aerated. MRA HEAD FINDINGS Evaluation is limited by motion. Anterior circulation: Both internal carotid arteries are grossly patent to the termini, although evaluation for stenosis is limited by motion. Flow is grossly seen in the A1 and A2 segments. The bilateral M1 segments appear patent. Grossly normal flow in the bilateral M2 and M3 branches. Posterior circulation: Vertebral arteries grossly patent to the vertebrobasilar junction. Basilar patent to its distal aspect. PCAs grossly perfused. MRA NECK FINDINGS Evaluation is limited by motion. Aortic arch: Poorly visualized. Right carotid system: No evidence of hemodynamically significant stenosis at the bifurcation and in the proximal ICA. Left carotid system: The bifurcation is poorly visualized secondary to motion. No evidence of hemodynamically significant stenosis in the ICA. Vertebral arteries: Poor visualization of the V1 segments. The V2 segments appear to be without significant stenosis. Other: Degenerative changes in the cervical spine. IMPRESSION: 1. Evaluation is limited by motion. Within this limitation, there is an acute infarct in the right posterior limb of the internal capsule and posteroinferior right lentiform nucleus. 2. Additional acute infarct in the occipital lobe periventricular white matter posterior to the left occipital horn. 3. Evaluation of the intracranial vasculature is significantly motion limited. Within this limitation, no intracranial large vessel occlusion or  significant stenosis. 4. No hemodynamically significant stenosis in the neck, although evaluation is limited by motion. Electronically Signed   By: Merilyn Baba M.D.   On: 04/16/2022 02:58   MR ANGIO NECK WO CONTRAST  Result Date: 04/16/2022 CLINICAL DATA:  Left-sided facial droop EXAM: MRI HEAD WITHOUT CONTRAST MRA HEAD WITHOUT CONTRAST MRA NECK WITHOUT CONTRAST TECHNIQUE: Multiplanar, multi-echo pulse sequences of the brain and surrounding structures were acquired without intravenous contrast. Angiographic images of the Circle of Willis were acquired using MRA technique without intravenous contrast. Angiographic images of the neck were acquired using MRA technique without intravenous contrast. Carotid stenosis measurements (when applicable) are obtained utilizing NASCET criteria, using the distal internal carotid diameter as the denominator. COMPARISON:  No prior MRI, correlation is made with CT head 04/15/2022 FINDINGS: MRI HEAD FINDINGS Evaluation is limited  by motion. Brain: Restricted diffusion with ADC correlate in the right posterior limb of the internal capsule (series 5, image 78) and inferior posterior right lentiform nucleus (series 5, image 76). Additional small area of restricted diffusion with ADC correlate posterior to the left occipital horn in the left occipital lobe periventricular white matter. No acute hemorrhage, mass, mass effect, or midline shift. No hydrocephalus or extra-axial collection. Confluent T2 hyperintense signal in the periventricular white matter and pons, likely the sequela of moderate to severe chronic small vessel ischemic disease. Remote lacunar infarcts in the right thalamus, bilateral basal ganglia, and right cerebellum. No hemosiderin deposition to suggest remote hemorrhage. Vascular: Please see MRA findings below. Skull and upper cervical spine: Normal marrow signal. Sinuses/Orbits: No acute finding. Other: The mastoids are well aerated. MRA HEAD FINDINGS Evaluation  is limited by motion. Anterior circulation: Both internal carotid arteries are grossly patent to the termini, although evaluation for stenosis is limited by motion. Flow is grossly seen in the A1 and A2 segments. The bilateral M1 segments appear patent. Grossly normal flow in the bilateral M2 and M3 branches. Posterior circulation: Vertebral arteries grossly patent to the vertebrobasilar junction. Basilar patent to its distal aspect. PCAs grossly perfused. MRA NECK FINDINGS Evaluation is limited by motion. Aortic arch: Poorly visualized. Right carotid system: No evidence of hemodynamically significant stenosis at the bifurcation and in the proximal ICA. Left carotid system: The bifurcation is poorly visualized secondary to motion. No evidence of hemodynamically significant stenosis in the ICA. Vertebral arteries: Poor visualization of the V1 segments. The V2 segments appear to be without significant stenosis. Other: Degenerative changes in the cervical spine. IMPRESSION: 1. Evaluation is limited by motion. Within this limitation, there is an acute infarct in the right posterior limb of the internal capsule and posteroinferior right lentiform nucleus. 2. Additional acute infarct in the occipital lobe periventricular white matter posterior to the left occipital horn. 3. Evaluation of the intracranial vasculature is significantly motion limited. Within this limitation, no intracranial large vessel occlusion or significant stenosis. 4. No hemodynamically significant stenosis in the neck, although evaluation is limited by motion. Electronically Signed   By: Merilyn Baba M.D.   On: 04/16/2022 02:58   MR ANGIO HEAD WO CONTRAST  Result Date: 04/16/2022 CLINICAL DATA:  Left-sided facial droop EXAM: MRI HEAD WITHOUT CONTRAST MRA HEAD WITHOUT CONTRAST MRA NECK WITHOUT CONTRAST TECHNIQUE: Multiplanar, multi-echo pulse sequences of the brain and surrounding structures were acquired without intravenous contrast. Angiographic  images of the Circle of Willis were acquired using MRA technique without intravenous contrast. Angiographic images of the neck were acquired using MRA technique without intravenous contrast. Carotid stenosis measurements (when applicable) are obtained utilizing NASCET criteria, using the distal internal carotid diameter as the denominator. COMPARISON:  No prior MRI, correlation is made with CT head 04/15/2022 FINDINGS: MRI HEAD FINDINGS Evaluation is limited by motion. Brain: Restricted diffusion with ADC correlate in the right posterior limb of the internal capsule (series 5, image 78) and inferior posterior right lentiform nucleus (series 5, image 76). Additional small area of restricted diffusion with ADC correlate posterior to the left occipital horn in the left occipital lobe periventricular white matter. No acute hemorrhage, mass, mass effect, or midline shift. No hydrocephalus or extra-axial collection. Confluent T2 hyperintense signal in the periventricular white matter and pons, likely the sequela of moderate to severe chronic small vessel ischemic disease. Remote lacunar infarcts in the right thalamus, bilateral basal ganglia, and right cerebellum. No hemosiderin deposition to suggest remote  hemorrhage. Vascular: Please see MRA findings below. Skull and upper cervical spine: Normal marrow signal. Sinuses/Orbits: No acute finding. Other: The mastoids are well aerated. MRA HEAD FINDINGS Evaluation is limited by motion. Anterior circulation: Both internal carotid arteries are grossly patent to the termini, although evaluation for stenosis is limited by motion. Flow is grossly seen in the A1 and A2 segments. The bilateral M1 segments appear patent. Grossly normal flow in the bilateral M2 and M3 branches. Posterior circulation: Vertebral arteries grossly patent to the vertebrobasilar junction. Basilar patent to its distal aspect. PCAs grossly perfused. MRA NECK FINDINGS Evaluation is limited by motion. Aortic  arch: Poorly visualized. Right carotid system: No evidence of hemodynamically significant stenosis at the bifurcation and in the proximal ICA. Left carotid system: The bifurcation is poorly visualized secondary to motion. No evidence of hemodynamically significant stenosis in the ICA. Vertebral arteries: Poor visualization of the V1 segments. The V2 segments appear to be without significant stenosis. Other: Degenerative changes in the cervical spine. IMPRESSION: 1. Evaluation is limited by motion. Within this limitation, there is an acute infarct in the right posterior limb of the internal capsule and posteroinferior right lentiform nucleus. 2. Additional acute infarct in the occipital lobe periventricular white matter posterior to the left occipital horn. 3. Evaluation of the intracranial vasculature is significantly motion limited. Within this limitation, no intracranial large vessel occlusion or significant stenosis. 4. No hemodynamically significant stenosis in the neck, although evaluation is limited by motion. Electronically Signed   By: Merilyn Baba M.D.   On: 04/16/2022 02:58   CT HEAD WO CONTRAST  Result Date: 04/15/2022 CLINICAL DATA:  Left-sided facial droop EXAM: CT HEAD WITHOUT CONTRAST TECHNIQUE: Contiguous axial images were obtained from the base of the skull through the vertex without intravenous contrast. RADIATION DOSE REDUCTION: This exam was performed according to the departmental dose-optimization program which includes automated exposure control, adjustment of the mA and/or kV according to patient size and/or use of iterative reconstruction technique. COMPARISON:  None Available. FINDINGS: Brain: No acute territorial infarction, hemorrhage or intracranial mass. Moderate white matter hypodensity likely chronic small vessel ischemic change. 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 Nonenlarged ventricles Vascular: No hyperdense vessels.  Carotid vascular  calcification Skull: Normal. Negative for fracture or focal lesion. Sinuses/Orbits: No acute finding.  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 No CT evidence for acute intracranial abnormality. 2. Chronic small vessel ischemic changes of the white matter Electronically Signed   By: Donavan Foil M.D.   On: 04/15/2022 17:03      EKG: personally reviewed my interpretation is normal sinus rhythm without signs is acute ischemia or arrhthymias. Prior EKG with similar findings  ASSESSMENT & PLAN:   Assessment & Plan by Problem: Principal Problem:   Acute ischemic stroke [facility])   Ms. Aidaly Cordner is a 76yo woman with a history of T2DM with neuropathy, poorly controlled HTN, advanced CKD with new RUE AVF, HLD, former smoker, MR, diastolic HF presenting ED for stroke-like symptoms found to have an acute stroke  Acute subcortical CVA Patient presenting with L sided facial droop and dysarthria found to have an acute stroke. MRA brain and cervical significant for acute infarct in R posterior limb of the IC and inferior posterior R lentiform nucleus.  Acute infarct in the occipital lobe periventricular white matter posterior to the L occipital horn. MR brain with limited diffusion in the areas corresponding with infarct. No LVO or stenosis. Patient outside window  for acute interventions. Patient has multiple risk factors including history of tobacco use disorder, uncontrolled HTN, hyperlipidemia, and mitral regurgitation. 9/21 T cholesterol 223, LDL 135, A1c 5.4. Patient is currently on antihypertensives. Patient previously on statin but had fallen off medication list. Will continue stroke work up to rule out embolic source during this hospitalization though presentation could be likely in the setting of small vessel disease. Neurology was consulted in the ED, appreciate recommendations.  -Plavix 300 mg load -Plavix 75 mg and ASA '81mg'$  daily -Restart Crestor 10 mg for secondary prevention,  goal LDL <70 -TTE with bubble study,  ordered -SLP, PT, OT consulted, pending -Telemetry  HLD 9/21 T cholesterol 223, LDL 135. Given CVA, patient should be started on high intensity statin for secondary prevention with LDL goal <70 -Crestor 10 mg, adjusted for GFR  HTN SBP 150-180s during this hospitalization. Home meds include amlodipine '10mg'$ , carvedilol 6.25 mg BID, lasix '80mg'$  daily with better controlled per last OV note.   ESRD Anemia of chronic renal failure Patient followed by Dr. Royce Macadamia at [facility]. LUE AVF placed, with good thrill and bruit.   Hbg was 10.5 in 02/2022. Today 8.8 with MCV 87. This is likely in the setting of kidney disease. Consider getting iron studies -Continue to monitor BMP -Continue to monitor H/H -Consider iron studies -Monitor electrolytes  DM  A1c 5.4. Diet controlled  MR Last echo on 09/2020 with mild to moderate MR regurgitation, increased atrial pressure, and LV EF 55-60%. Patient without SOB, lungs clear to auscultation. Pedal edema present likely in setting of CKD.  -CTM   Diet: NPO VTE: Heparin Code: Full  Prior to Admission Living Arrangement: Home, living son [name]. Anticipated Discharge Location:  pending PT evaluation Barriers to Discharge: Clinical improvement  Dispo: Admit patient to Observation with expected length of stay less than 2 midnights.  Signed:   Romana Juniper, MD Internal Medicine Resident PGY-1 04/16/2022, 5:15 AM

## 2022-04-16 NOTE — Consult Note (Signed)
Neurology Consultation Reason for Consult: Stroke Referring Physician: Baird Cancer, L  CC: Facial weakness  History is obtained from: Patient, son and daughter  HPI: PATTRICIA Bailey is a 76 y.o. female who presents with new stroke.  When asked her what made her think she was having a stroke, she indicates that her children told her that she may be having one.  She had not noticed anything in particular.  Her family notes that she had significant facial weakness and slurred speech and therefore brought her in to be evaluated.  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   LKW: 7 PM 9/26 TNK given?: no, outside of window NIHSS score: 3 1A: Level of Consciousness - 0 1B: Ask Month and Age - 1 1C: 'Blink Eyes' & 'Squeeze Hands' - 0 2: Test Horizontal Extraocular Movements - 0 3: Test Visual Fields - 0 4: Test Facial Palsy - 1 5A: Test Left Arm Motor Drift - 0 5B: Test Right Arm Motor Drift - 0 6A: Test Left Leg Motor Drift - 1 6B: Test Right Leg Motor Drift - 0 7: Test Limb Ataxia - 0 8: Test Sensation - 0 9: Test Language/Aphasia- 0 10: Test Dysarthria - 1 11: Test Extinction/Inattention - 0    Past Medical History:  Diagnosis Date   Chronic kidney disease    Diabetes mellitus    Diet-controlled. Not on antiglycemic medications.   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  Based on the size, patient's age, status as a former smoker, and its discovery in a community setting, her probability of malignancy is 3% (low probability).  Based on this, I recommend follow-up with a CT scan around 05/2014, and if lesion is stable or smaller, no further follow-up is needed.  CT Chest  06/2015: Stable 110m pulmonary nodule. No   Ventral hernia    Incisional ventral hernia. S/P repair by Dr. 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 WHulen Skains (08/2006)     Family History  Problem Relation Age of Onset   Diabetes Other        1st degree reative in 3 of her siblings   Breast cancer Mother        not sure of age     Social History:  reports that she quit smoking about 22 years ago. Her smoking use included cigarettes. She has a 0.30 pack-year smoking history. She has never used smokeless tobacco. She reports that she does not drink alcohol and does not use drugs.   Exam: Current vital signs: BP (!) 162/78   Pulse 80   Temp 98.1 F (36.7 C) (Oral)   Resp 18   Ht '5\' 5"'$  (1.651 m)   Wt 79.2 kg   LMP 10/22/1978   SpO2 97%   BMI 29.06 kg/m  Vital signs in last 24 hours: Temp:  [98.1 F (36.7 C)-99 F (37.2 C)] 98.1 F (36.7 C) (09/27 2207) Pulse Rate:  [70-82] 80 (09/28 0330) Resp:  [15-25] 18 (09/28  0330) BP: (134-183)/(67-85) 162/78 (09/28 0330) SpO2:  [94 %-100 %] 97 % (09/28 0330) Weight:  [79.2 kg] 79.2 kg (09/27 1512)   Physical Exam  Constitutional: Appears well-developed and well-nourished.  Psych: Affect appropriate to situation Eyes: No scleral injection HENT: No OP obstruction MSK: no joint deformities.  Cardiovascular: Normal rate and regular rhythm.  Respiratory: Effort normal, non-labored breathing GI: Soft.  No distension. There is no tenderness.  Skin: WDI  Neuro: Mental Status: Patient is awake, alert, gives month as August  No signs of aphasia or neglect Cranial Nerves: II: Visual Fields are full. Pupils are equal, round, and reactive to light.   III,IV, VI: She has a right exotropia, though does appear to conjugate more when she arouses V: Facial sensation is symmetric to  temperature VII: Facial movement  VIII: hearing is intact to voice X: Uvula elevates symmetrically XI: Shoulder shrug is symmetric. XII: tongue is midline without atrophy or fasciculations.  Motor: Tone is normal. Bulk is normal.  She has mild left arm and left leg weakness 4/5 Sensory: Sensation is symmetric to light touch and temperature in the arms and legs. 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  She will need therapy and secondary stroke prevention measures.  Recommendations: - HgbA1c, fasting lipid panel - Frequent neuro checks - Echocardiogram - Prophylactic therapy-Antiplatelet med: Aspirin - dose '81mg'$  and plavix '75mg'$  daily  after '300mg'$  load  - Risk factor modification - Telemetry monitoring - PT consult, OT consult, Speech consult - Stroke team to follow    Roland Rack, MD [facility] [phone]  If 7pm- 7am, please page neurology on call as listed in [place].

## 2022-04-16 NOTE — Progress Notes (Addendum)
STROKE TEAM PROGRESS NOTE   INTERVAL HISTORY Patient was evaluated at bedside, daughter and son are present. Patient unable to recall large portions of her history, dysarthria noted when she speaks. Family states that patient's mentation has been in decline for some time but noticed new onset weakness w/ difficulty walking and left facial droop, which prompted them to go to ED. She currently lives with son, states that patient has been non-adherent to home medications due to poor memory. Patient is lethargic on evaluation, recalls 5/15 animals, recalls 0/3 words.  MRI scan of the brain shows a right posterior limb internal capsule as well as left tiny periventricular white matter lacunar infarct.  MR angiogram of the brain and neck is suboptimal but showed no large vessel stenosis or occlusion.  LDL cholesterol 135 mg percent.  Hemoglobin A1c is 5.4.  Vitals:   04/16/22 1420 04/16/22 1425 04/16/22 1430 04/16/22 1445  BP: (!) 164/75     Pulse:  76 80 74  Resp:  '19 20 16  '$ Temp:      TempSrc:      SpO2:  99% 99% 100%  Weight:      Height:       CBC:  Recent Labs  Lab 04/15/22 1534 04/15/22 1548  WBC 7.6  --   NEUTROABS 4.3  --   HGB 8.8* 9.2*  HCT 29.4* 27.0*  MCV 87.2  --   PLT 345  --    Basic Metabolic Panel:  Recent Labs  Lab 04/15/22 1534 04/15/22 1548  NA 143 141  K 4.3 4.3  CL 109  --   CO2 26  --   GLUCOSE 109*  --   BUN 51*  --   CREATININE 5.18*  --   CALCIUM 8.5*  --    Lipid Panel:  No results for input(s): "CHOL", "TRIG", "HDL", "CHOLHDL", "VLDL", "LDLCALC" in the last 168 hours.  HgbA1c:  No results for input(s): "HGBA1C" in the last 168 hours.  Urine Drug Screen:  Recent Labs  Lab 04/16/22 0456  LABOPIA NONE DETECTED  COCAINSCRNUR NONE DETECTED  LABBENZ NONE DETECTED  AMPHETMU NONE DETECTED  THCU NONE DETECTED  LABBARB NONE DETECTED    Alcohol Level No results for input(s): "ETH" in the last 168 hours.  IMAGING past 24 hours DG Chest 2  View  Result Date: 04/16/2022 CLINICAL DATA:  Difficulty breathing, evaluate for pleural effusion EXAM: CHEST - 2 VIEW COMPARISON:  Radiographs done on 09/26/2010, CT done on 07/05/2015 FINDINGS: Transverse diameter of heart is increased. There are no signs of pulmonary edema or focal pulmonary consolidation. There is no pleural effusion or pneumothorax. IMPRESSION: Cardiomegaly. There are no signs of pulmonary edema or focal pulmonary consolidation. Electronically Signed   By: Elmer Picker M.D.   On: 04/16/2022 14:15   ECHOCARDIOGRAM COMPLETE BUBBLE STUDY  Result Date: 04/16/2022    ECHOCARDIOGRAM REPORT   Patient Name:   Gina Bailey Date of Exam: 04/16/2022 Medical Rec #:  [id number]       Height:       65.0 in Accession #:    [id number]      Weight:       174.6 lb Date of Birth:  July 10, 1946        BSA:          1.867 m Patient Age:    76 years        BP:           163/87 mmHg Patient  Gender: F               HR:           78 bpm. Exam Location:  Inpatient Procedure: 2D Echo and Saline Contrast Bubble Study Indications:    stroke  History:        Patient has prior history of Echocardiogram examinations, most                 recent 09/20/2020. Signs/Symptoms:Murmur; Risk Factors:Diabetes                 and Hypertension.  Sonographer:    Harvie Junior Referring Phys: [place]  1. Left ventricular ejection fraction, by estimation, is 60 to 65%. The left ventricle has normal function. The left ventricle has no regional wall motion abnormalities. There is moderate left ventricular hypertrophy. Left ventricular diastolic parameters are indeterminate.  2. Right ventricular systolic function is normal. The right ventricular size is normal. There is normal pulmonary artery systolic pressure.  3. Large pleural effusion.  4. The mitral valve is normal in structure. No evidence of mitral valve regurgitation.  5. The aortic valve is tricuspid. Aortic valve regurgitation is not visualized.  6.  The inferior vena cava is normal in size with greater than 50% respiratory variability, suggesting right atrial pressure of 3 mmHg.  7. Agitated saline contrast bubble study was negative, with no evidence of any interatrial shunt. Comparison(s): No significant change from prior study. FINDINGS  Left Ventricle: Left ventricular ejection fraction, by estimation, is 60 to 65%. The left ventricle has normal function. The left ventricle has no regional wall motion abnormalities. The left ventricular internal cavity size was normal in size. There is  moderate left ventricular hypertrophy. Left ventricular diastolic parameters are indeterminate. Right Ventricle: The right ventricular size is normal. Right vetricular wall thickness was not well visualized. Right ventricular systolic function is normal. There is normal pulmonary artery systolic pressure. The tricuspid regurgitant velocity is 2.59 m/s, and with an assumed right atrial pressure of 3 mmHg, the estimated right ventricular systolic pressure is 49.7 mmHg. Left Atrium: Left atrial size was normal in size. Right Atrium: Right atrial size was normal in size. Pericardium: There is no evidence of pericardial effusion. Mitral Valve: The mitral valve is normal in structure. No evidence of mitral valve regurgitation. Tricuspid Valve: The tricuspid valve is normal in structure. Tricuspid valve regurgitation is not demonstrated. Aortic Valve: The aortic valve is tricuspid. Aortic valve regurgitation is not visualized. Pulmonic Valve: The pulmonic valve was normal in structure. Pulmonic valve regurgitation is not visualized. Aorta: The aortic root and ascending aorta are structurally normal, with no evidence of dilitation. Venous: The inferior vena cava is normal in size with greater than 50% respiratory variability, suggesting right atrial pressure of 3 mmHg. IAS/Shunts: The interatrial septum was not well visualized. Agitated saline contrast was given intravenously to  evaluate for intracardiac shunting. Agitated saline contrast bubble study was negative, with no evidence of any interatrial shunt. Additional Comments: There is a large pleural effusion.  LEFT VENTRICLE PLAX 2D LVIDd:         4.20 cm      Diastology LVIDs:         3.00 cm      LV e' medial:    6.96 cm/s LV PW:         1.30 cm      LV E/e' medial:  14.4 LV IVS:  1.30 cm      LV e' lateral:   8.38 cm/s LVOT diam:     1.90 cm      LV E/e' lateral: 11.9 LV SV:         84 LV SV Index:   45 LVOT Area:     2.84 cm  LV Volumes (MOD) LV vol d, MOD A2C: 74.4 ml LV vol d, MOD A4C: 116.0 ml LV vol s, MOD A2C: 33.5 ml LV vol s, MOD A4C: 47.9 ml LV SV MOD A2C:     40.9 ml LV SV MOD A4C:     116.0 ml LV SV MOD BP:      55.5 ml RIGHT VENTRICLE RV Basal diam:  3.50 cm RV Mid diam:    2.50 cm RV S prime:     14.10 cm/s TAPSE (M-mode): 2.8 cm LEFT ATRIUM             Index        RIGHT ATRIUM           Index LA diam:        3.30 cm 1.77 cm/m   RA Area:     11.40 cm LA Vol (A2C):   38.8 ml 20.78 ml/m  RA Volume:   21.60 ml  11.57 ml/m LA Vol (A4C):   59.9 ml 32.08 ml/m LA Biplane Vol: 50.4 ml 26.99 ml/m  AORTIC VALVE             PULMONIC VALVE LVOT Vmax:   131.00 cm/s PV Vmax:       1.10 m/s LVOT Vmean:  90.600 cm/s PV Peak grad:  4.8 mmHg LVOT VTI:    0.295 m  AORTA Ao Root diam: 3.20 cm Ao Asc diam:  3.30 cm MITRAL VALVE                TRICUSPID VALVE MV Area (PHT): 4.39 cm     TR Peak grad:   26.8 mmHg MV Decel Time: 173 msec     TR Vmax:        259.00 cm/s MR Peak grad: 55.8 mmHg MR Vmax:      373.33 cm/s   SHUNTS MV E velocity: 100.00 cm/s  Systemic VTI:  0.30 m MV A velocity: 95.60 cm/s   Systemic Diam: 1.90 cm MV E/A ratio:  1.05 Landscape architect signed by Phineas Inches Signature Date/Time: 04/16/2022/10:45:17 AM    Final    MR BRAIN WO CONTRAST  Result Date: 04/16/2022 CLINICAL DATA:  Left-sided facial droop EXAM: MRI HEAD WITHOUT CONTRAST MRA HEAD WITHOUT CONTRAST MRA NECK WITHOUT CONTRAST TECHNIQUE:  Multiplanar, multi-echo pulse sequences of the brain and surrounding structures were acquired without intravenous contrast. Angiographic images of the Circle of Willis were acquired using MRA technique without intravenous contrast. Angiographic images of the neck were acquired using MRA technique without intravenous contrast. Carotid stenosis measurements (when applicable) are obtained utilizing NASCET criteria, using the distal internal carotid diameter as the denominator. COMPARISON:  No prior MRI, correlation is made with CT head 04/15/2022 FINDINGS: MRI HEAD FINDINGS Evaluation is limited by motion. Brain: Restricted diffusion with ADC correlate in the right posterior limb of the internal capsule (series 5, image 78) and inferior posterior right lentiform nucleus (series 5, image 76). Additional small area of restricted diffusion with ADC correlate posterior to the left occipital horn in the left occipital lobe periventricular white matter. No acute hemorrhage, mass, mass effect, or midline shift. No hydrocephalus or extra-axial collection.  Confluent T2 hyperintense signal in the periventricular white matter and pons, likely the sequela of moderate to severe chronic small vessel ischemic disease. Remote lacunar infarcts in the right thalamus, bilateral basal ganglia, and right cerebellum. No hemosiderin deposition to suggest remote hemorrhage. Vascular: Please see MRA findings below. Skull and upper cervical spine: Normal marrow signal. Sinuses/Orbits: No acute finding. Other: The mastoids are well aerated. MRA HEAD FINDINGS Evaluation is limited by motion. Anterior circulation: Both internal carotid arteries are grossly patent to the termini, although evaluation for stenosis is limited by motion. Flow is grossly seen in the A1 and A2 segments. The bilateral M1 segments appear patent. Grossly normal flow in the bilateral M2 and M3 branches. Posterior circulation: Vertebral arteries grossly patent to the  vertebrobasilar junction. Basilar patent to its distal aspect. PCAs grossly perfused. MRA NECK FINDINGS Evaluation is limited by motion. Aortic arch: Poorly visualized. Right carotid system: No evidence of hemodynamically significant stenosis at the bifurcation and in the proximal ICA. Left carotid system: The bifurcation is poorly visualized secondary to motion. No evidence of hemodynamically significant stenosis in the ICA. Vertebral arteries: Poor visualization of the V1 segments. The V2 segments appear to be without significant stenosis. Other: Degenerative changes in the cervical spine. IMPRESSION: 1. Evaluation is limited by motion. Within this limitation, there is an acute infarct in the right posterior limb of the internal capsule and posteroinferior right lentiform nucleus. 2. Additional acute infarct in the occipital lobe periventricular white matter posterior to the left occipital horn. 3. Evaluation of the intracranial vasculature is significantly motion limited. Within this limitation, no intracranial large vessel occlusion or significant stenosis. 4. No hemodynamically significant stenosis in the neck, although evaluation is limited by motion. Electronically Signed   By: Merilyn Baba M.D.   On: 04/16/2022 02:58   MR ANGIO NECK WO CONTRAST  Result Date: 04/16/2022 CLINICAL DATA:  Left-sided facial droop EXAM: MRI HEAD WITHOUT CONTRAST MRA HEAD WITHOUT CONTRAST MRA NECK WITHOUT CONTRAST TECHNIQUE: Multiplanar, multi-echo pulse sequences of the brain and surrounding structures were acquired without intravenous contrast. Angiographic images of the Circle of Willis were acquired using MRA technique without intravenous contrast. Angiographic images of the neck were acquired using MRA technique without intravenous contrast. Carotid stenosis measurements (when applicable) are obtained utilizing NASCET criteria, using the distal internal carotid diameter as the denominator. COMPARISON:  No prior MRI,  correlation is made with CT head 04/15/2022 FINDINGS: MRI HEAD FINDINGS Evaluation is limited by motion. Brain: Restricted diffusion with ADC correlate in the right posterior limb of the internal capsule (series 5, image 78) and inferior posterior right lentiform nucleus (series 5, image 76). Additional small area of restricted diffusion with ADC correlate posterior to the left occipital horn in the left occipital lobe periventricular white matter. No acute hemorrhage, mass, mass effect, or midline shift. No hydrocephalus or extra-axial collection. Confluent T2 hyperintense signal in the periventricular white matter and pons, likely the sequela of moderate to severe chronic small vessel ischemic disease. Remote lacunar infarcts in the right thalamus, bilateral basal ganglia, and right cerebellum. No hemosiderin deposition to suggest remote hemorrhage. Vascular: Please see MRA findings below. Skull and upper cervical spine: Normal marrow signal. Sinuses/Orbits: No acute finding. Other: The mastoids are well aerated. MRA HEAD FINDINGS Evaluation is limited by motion. Anterior circulation: Both internal carotid arteries are grossly patent to the termini, although evaluation for stenosis is limited by motion. Flow is grossly seen in the A1 and A2 segments. The bilateral M1 segments appear  patent. Grossly normal flow in the bilateral M2 and M3 branches. Posterior circulation: Vertebral arteries grossly patent to the vertebrobasilar junction. Basilar patent to its distal aspect. PCAs grossly perfused. MRA NECK FINDINGS Evaluation is limited by motion. Aortic arch: Poorly visualized. Right carotid system: No evidence of hemodynamically significant stenosis at the bifurcation and in the proximal ICA. Left carotid system: The bifurcation is poorly visualized secondary to motion. No evidence of hemodynamically significant stenosis in the ICA. Vertebral arteries: Poor visualization of the V1 segments. The V2 segments appear to  be without significant stenosis. Other: Degenerative changes in the cervical spine. IMPRESSION: 1. Evaluation is limited by motion. Within this limitation, there is an acute infarct in the right posterior limb of the internal capsule and posteroinferior right lentiform nucleus. 2. Additional acute infarct in the occipital lobe periventricular white matter posterior to the left occipital horn. 3. Evaluation of the intracranial vasculature is significantly motion limited. Within this limitation, no intracranial large vessel occlusion or significant stenosis. 4. No hemodynamically significant stenosis in the neck, although evaluation is limited by motion. Electronically Signed   By: Merilyn Baba M.D.   On: 04/16/2022 02:58   MR ANGIO HEAD WO CONTRAST  Result Date: 04/16/2022 CLINICAL DATA:  Left-sided facial droop EXAM: MRI HEAD WITHOUT CONTRAST MRA HEAD WITHOUT CONTRAST MRA NECK WITHOUT CONTRAST TECHNIQUE: Multiplanar, multi-echo pulse sequences of the brain and surrounding structures were acquired without intravenous contrast. Angiographic images of the Circle of Willis were acquired using MRA technique without intravenous contrast. Angiographic images of the neck were acquired using MRA technique without intravenous contrast. Carotid stenosis measurements (when applicable) are obtained utilizing NASCET criteria, using the distal internal carotid diameter as the denominator. COMPARISON:  No prior MRI, correlation is made with CT head 04/15/2022 FINDINGS: MRI HEAD FINDINGS Evaluation is limited by motion. Brain: Restricted diffusion with ADC correlate in the right posterior limb of the internal capsule (series 5, image 78) and inferior posterior right lentiform nucleus (series 5, image 76). Additional small area of restricted diffusion with ADC correlate posterior to the left occipital horn in the left occipital lobe periventricular white matter. No acute hemorrhage, mass, mass effect, or midline shift. No  hydrocephalus or extra-axial collection. Confluent T2 hyperintense signal in the periventricular white matter and pons, likely the sequela of moderate to severe chronic small vessel ischemic disease. Remote lacunar infarcts in the right thalamus, bilateral basal ganglia, and right cerebellum. No hemosiderin deposition to suggest remote hemorrhage. Vascular: Please see MRA findings below. Skull and upper cervical spine: Normal marrow signal. Sinuses/Orbits: No acute finding. Other: The mastoids are well aerated. MRA HEAD FINDINGS Evaluation is limited by motion. Anterior circulation: Both internal carotid arteries are grossly patent to the termini, although evaluation for stenosis is limited by motion. Flow is grossly seen in the A1 and A2 segments. The bilateral M1 segments appear patent. Grossly normal flow in the bilateral M2 and M3 branches. Posterior circulation: Vertebral arteries grossly patent to the vertebrobasilar junction. Basilar patent to its distal aspect. PCAs grossly perfused. MRA NECK FINDINGS Evaluation is limited by motion. Aortic arch: Poorly visualized. Right carotid system: No evidence of hemodynamically significant stenosis at the bifurcation and in the proximal ICA. Left carotid system: The bifurcation is poorly visualized secondary to motion. No evidence of hemodynamically significant stenosis in the ICA. Vertebral arteries: Poor visualization of the V1 segments. The V2 segments appear to be without significant stenosis. Other: Degenerative changes in the cervical spine. IMPRESSION: 1. Evaluation is limited by motion.  Within this limitation, there is an acute infarct in the right posterior limb of the internal capsule and posteroinferior right lentiform nucleus. 2. Additional acute infarct in the occipital lobe periventricular white matter posterior to the left occipital horn. 3. Evaluation of the intracranial vasculature is significantly motion limited. Within this limitation, no  intracranial large vessel occlusion or significant stenosis. 4. No hemodynamically significant stenosis in the neck, although evaluation is limited by motion. Electronically Signed   By: Merilyn Baba M.D.   On: 04/16/2022 02:58   CT HEAD WO CONTRAST  Result Date: 04/15/2022 CLINICAL DATA:  Left-sided facial droop EXAM: CT HEAD WITHOUT CONTRAST TECHNIQUE: Contiguous axial images were obtained from the base of the skull through the vertex without intravenous contrast. RADIATION DOSE REDUCTION: This exam was performed according to the departmental dose-optimization program which includes automated exposure control, adjustment of the mA and/or kV according to patient size and/or use of iterative reconstruction technique. COMPARISON:  None Available. FINDINGS: Brain: No acute territorial infarction, hemorrhage or intracranial mass. Moderate white matter hypodensity likely chronic small vessel ischemic change. 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 Nonenlarged ventricles Vascular: No hyperdense vessels.  Carotid vascular calcification Skull: Normal. Negative for fracture or focal lesion. Sinuses/Orbits: No acute finding.  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 No CT evidence for acute intracranial abnormality. 2. Chronic small vessel ischemic changes of the white matter Electronically Signed   By: Donavan Foil M.D.   On: 04/15/2022 17:03    --PHYSICAL EXAM-- Constitutional: Elderly pleasant African-American lady, no acute distress  HEENT: Normocephalic, Normal conjunctiva, anicteric. Hearing grossly intact. No nasal discharge. Neck: Neck is supple. No masses or thyromegaly. Resp: Respirations are non-labored. No wheezing. Skin: Warm. No rashes or ulcers.  Neuro Mental Status Awake but lethargic, oriented to self and time, but not to place. Is not able to recall what brought her to ED.  Dysarthric, with some intermittent paucity of speech.   Cognitive/Recall: 0/3 words 4/14 animals  Cranial Nerves II: Nor abnormalities in visual fields. III, IV, VI: EOM intact, no gaze preference or deviation, no nystagmus  V: Normal sensation in V1, V2, and V3 segments bilaterally  VII: L facial droop VIII: Normal hearing to speech  IX, X: Normal palatal elevation, no uvular deviation  XI: 5/5 head turn and 5/5 shoulder shrug bilaterally XII: Symmetric tongue movement, tongue is midline without atrophy or fasciculations.   Motor Normal bulk and tone Strength R UE: 5/5 L UE: 5/5 R LE: 5/5 L LE: 5/5  Sensory  Intact sensation of UE and LE bilaterally.   Cerebellar: FNF, Heel-to-Shin Test, Rapid Alternating Movements: poor coordination not out of proportion to cognitive deficits  ASSESSMENT/PLAN Gina Bailey is a 76 y.o. female with history of CKD, DM, HLD, HTN brought by family to ED for concern of new onset left sided facial weakness and slurred speech. Was found to have L facial droop, dysarthic, and with L upper extremity weakness. MRA brain and cervical significant for acute infarct in R posterior limb of the IC and inferior posterior R lentiform nucleus.  Acute infarct in the occipital lobe periventricular white matter posterior to the L occipital horn. MR brain with limited diffusion in the areas corresponding with infarct.   Stroke: Acute Lacunar Infarcts at the R lentiform nucleus and L posterior occipital horn Etiology:  Likely 2/2 to chronic small vessel disease CT Head w/o contrast: No acute findings, only chronic small vessel ischemic  changes in white matter     MRI  - There is an acute infarct in the right posterior limb of the internal capsule and posteroinferior right lentiform nucleus. Additional acute infarct in the occipital lobe periventricular white matter posterior to the left occipital horn. Evaluation of the intracranial vasculature is significantly motion limited.  MRA: findings as above, with no  intracranial large vessel occlusion or significant stenosis. Carotid Doppler : pending 2D Echo LVEF 60-65% LDL 135 HgbA1c 5.4 VTE prophylaxis - Heparin subQ    Diet   Diet Heart Room service appropriate? Yes; Fluid consistency: Thin   No antithrombotic prior to admission, now on aspirin 81 mg daily and clopidogrel 75 mg daily for 3 weeks and then ASA '81mg'$  alone Therapy recommendations:  pending Disposition:  pending  Hypertension Home meds:  amlodipine, carvedilol, furosemide Stable Permissive hypertension (OK if < 220/120) but gradually normalize in 5-7 days Long-term BP goal normotensive  Hyperlipidemia Home meds:  none LDL 135, goal < 70 Add Crestor 20 mg Continue statin at discharge  Diabetes type II Controlled Home meds:  none HgbA1c 5.4, goal < 7.0 CBGs SSI  Other Stroke Risk Factors Advanced Age >/= 1  Obesity, Body mass index is 29.06 kg/m., BMI >/= 30 associated with increased stroke risk, recommend weight loss, diet and exercise as appropriate  CKD4 Smoking history - encourage smoking cessation  Other Active Problems CKD - Primary team following, concur with management   Hospital day # 0  Christene Slates, MD PGY-1   STROKE MD NOTE :  I have personally obtained history,examined this patient, reviewed notes, independently viewed imaging studies, participated in medical decision making and plan of care.ROS completed by me personally and pertinent positives fully documented  I have made any additions or clarifications directly to the above note. Agree with note above.  Patient noted with dysarthria and facial droop due to right internal capsule and left periventricular white matter lacunar infarcts likely from small vessel disease.  Recommend aspirin and Plavix for 3 days followed by aspirin alone and aggressive risk factor modification.  Physical occupational and speech therapy consults.  Long discussion with patient and family at the bedside and  answered questions.  Greater than 50% time during this 50-minute visit was spent in counseling and coordination of care and discussion with patient and family and answering questions.  Antony Contras, MD Medical Director [facility] Pager: [phone] 04/16/2022 6:13 PM  To contact Stroke Continuity provider, please refer to [email or web address] After hours, contact General Neurology

## 2022-04-16 NOTE — Hospital Course (Addendum)
[name] is a 76 y.o. female with history of CKD, DM, HLD, HTN brought by family to ED for concern of new onset left sided facial weakness and slurred speech and found to have acute lacunar infarct in the R internal capsule and L periventricular white matter.    Acute subcortical CVA Neurology was consulted. MRI with acute infarct in the right posterior limb of the internal capsule and posteroinferior right lentiform nucleus. Additional acute infarct in the occipital lobe periventricular white matter posterior to the left occipital horn. MRA brain with no large vessel occlusion of head and neck.  No PFO thrombus on TTE. Carotid Doppler with 1-39% right ICA and 40-59% left ICA stenosis. Etiology of stroke thought to be due to chronic small vessel disease. They recommended Plavix and ASA x 3 wks then ASA alone. She was started on crestor for secondary prevention.    HTN Permissive HTN was allowed initially. She was restarted on her home medications for HTN.   CKD5 Patient followed by Dr. Royce Bailey at [facility]. LUE AVF placed, with good thrill and bruit. There was no indication for acute dialysis at this time.   Anemia of chronic renal failure Iron studies WNL, iron and ferritin on the lower end of normal. She received Ferrlecit x 4 doses (9/29-10/2)   Pleural effusion Seen on echocardiogram.  Chest x-ray unremarkable.  Thought to be overloaded in the setting of CKD 5 and hypoalbuminemia. She was asymptomatic. Plan for dialysis in the near future.  Outpatient:  '[ ]'$  f/u with nephrology '[ ]'$  f/u BPs

## 2022-04-16 NOTE — ED Notes (Signed)
Lunch arrived, family at bedside.

## 2022-04-16 NOTE — ED Notes (Signed)
ED TO INPATIENT HANDOFF REPORT  ED Nurse Name and Phone #: [phone]  S Name/Age/Gender Gina Bailey 76 y.o. female Room/Bed: 011C/011C  Code Status   Code Status: Full Code  Home/SNF/Other Home Patient oriented to: self, place, time, and situation Is this baseline? Yes   Triage Complete: Triage complete  Chief Complaint Acute ischemic stroke [facility]) [I63.9]  Triage Note Patient BIB [facility] from home for evaluation of left sided facial droop, LKW 1900 yesterday. No arm or leg drift, patient reportedly ambulated independently to EMS stretcher with steady gait. Patient is alert, oriented, and in no apparent distress.  The pt is a and o x4 she cannot tell me anything except that everyone thinks that she has had a stroke   Allergies Allergies  Allergen Reactions   Metformin And Related Other (See Comments)    Could not tolerate metformin due to GI side effects   Pineapple Itching and Swelling   Zestril [Lisinopril] Swelling    Level of Care/Admitting Diagnosis ED Disposition     ED Disposition  Admit   Condition  --   [place]: [place] [[id number]]  Level of Care: Progressive [102]  Admit to Progressive based on following criteria: NEUROLOGICAL AND NEUROSURGICAL complex patients with significant risk of instability, who do not meet ICU criteria, yet require close observation or frequent assessment (< / = every 2 - 4 hours) with medical / nursing intervention.  May admit patient to [facility] or [facility] if equivalent level of care is available:: No  Covid Evaluation: Asymptomatic - no recent exposure (last 10 days) testing not required  Diagnosis: Acute ischemic stroke [facility]) [[id number]]  Admitting Physician: Bosie Helper  Attending Physician: Lucious Groves [[id number]]  Certification:: I certify this patient will need inpatient services for at least 2 midnights  Estimated Length of Stay: 3          B Medical/Surgery  History Past Medical History:  Diagnosis Date   Chronic kidney disease    Diabetes mellitus    Diet-controlled. Not on antiglycemic medications.   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  Based on the size, patient's age, status as a former smoker, and its discovery in a community setting, her probability of malignancy is 3% (low probability).  Based on this, I recommend follow-up with a CT scan around 05/2014, and if lesion is stable or smaller, no further follow-up is needed.  CT Chest 06/2015: Stable 66m pulmonary nodule. No   Ventral hernia    Incisional ventral hernia. S/P repair by Dr. 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 WHulen Skains (08/2006)   Past Surgical History:  Procedure Laterality Date   APPENDECTOMY     AV FISTULA PLACEMENT Left 03/10/2022   Procedure: LEFT ARM ARTERIOVENOUS (AV) FISTULA CREATION VERSUS GRAFT;  Surgeon: CWaynetta Sandy MD;  [place]  Service: Vascular;  Laterality: Left;   COLON SURGERY  08/2006   Partial right colectomy and anastomosis with resection of villous adenoma.   HEMORRHOID SURGERY  hemorrhoidectomy   TONSILLECTOMY     TUBAL LIGATION     VENTRAL HERNIA REPAIR  08/2008   By Dr. Hulen Skains.     A IV Location/Drains/Wounds Patient Lines/Drains/Airways Status     Active Line/Drains/Airways     Name Placement date Placement time Site Days   Peripheral IV 04/15/22 18 G Right Antecubital 04/15/22  --  Antecubital  1   Fistula / Graft Left Arteriovenous fistula 03/10/22  1013  --  37   Incision (Closed) 03/10/22 Arm Left 03/10/22  1015  -- 37             Intake/Output Last 24 hours No intake or output data in the 24 hours ending 04/16/22 1721  Labs/Imaging Results for orders placed or performed during the hospital encounter of 04/15/22 (from the past 48 hour(s))  APTT     Status: None   Collection Time: 04/15/22  3:34 PM  Result Value Ref Range   aPTT 30 24 - 36 seconds    Comment: Performed at [facility], [place] [address], [place], [place] [address]  CBC     Status: Abnormal   Collection Time: 04/15/22  3:34 PM  Result Value Ref Range   WBC 7.6 4.0 - 10.5 K/uL   RBC 3.37 (L) 3.87 - 5.11 MIL/uL   Hemoglobin 8.8 (L) 12.0 - 15.0 g/dL   HCT 29.4 (L) 36.0 - 46.0 %   MCV 87.2 80.0 - 100.0 fL   MCH 26.1 26.0 - 34.0 pg   MCHC 29.9 (L) 30.0 - 36.0 g/dL   RDW 16.1 (H) 11.5 - 15.5 %   Platelets 345 150 - 400 K/uL   nRBC 0.0 0.0 - 0.2 %    Comment: Performed at [facility] [address], [place], [place] [address]  Differential     Status: None   Collection Time: 04/15/22  3:34 PM  Result Value Ref Range   Neutrophils Relative % 58 %   Neutro Abs 4.3 1.7 - 7.7 K/uL   Lymphocytes Relative 29 %   Lymphs Abs 2.2 0.7 - 4.0 K/uL   Monocytes Relative 9 %   Monocytes Absolute 0.7 0.1 - 1.0 K/uL   Eosinophils Relative 4 %   Eosinophils Absolute 0.3 0.0 - 0.5 K/uL   Basophils Relative 0 %   Basophils Absolute 0.0 0.0 - 0.1 K/uL   Immature Granulocytes 0 %   Abs Immature Granulocytes 0.02 0.00 - 0.07 K/uL    Comment: Performed at [facility] [address] [place], [place] [address]  Comprehensive metabolic panel     Status: Abnormal   Collection Time: 04/15/22  3:34 PM  Result Value Ref Range   Sodium 143 135 - 145 mmol/L   Potassium 4.3 3.5 - 5.1 mmol/L   Chloride 109 98 - 111 mmol/L   CO2 26 22 - 32 mmol/L   Glucose, Bld 109 (H) 70 - 99 mg/dL    Comment: Glucose reference range applies only to samples taken after fasting for at least 8 hours.   BUN 51 (H) 8 - 23 mg/dL   Creatinine, Ser 5.18 (H)  0.44 - 1.00 mg/dL   Calcium 8.5 (L) 8.9 - 10.3 mg/dL   Total Protein 7.1 6.5 - 8.1 g/dL   Albumin 3.5 3.5 - 5.0 g/dL   AST 15 15 - 41 U/L   ALT 11 0 - 44 U/L   Alkaline Phosphatase 56 38 - 126 U/L   Total Bilirubin  0.4 0.3 - 1.2 mg/dL   GFR, Estimated 8 (L) >60 mL/min    Comment: (NOTE) Calculated using the CKD-EPI Creatinine Equation (2021)    Anion gap 8 5 - 15    Comment: Performed at [facility] [address], [place], [place] [address]  Protime-INR     Status: None   Collection Time: 04/15/22  3:34 PM  Result Value Ref Range   Prothrombin Time 13.6 11.4 - 15.2 seconds   INR 1.1 0.8 - 1.2    Comment: (NOTE) INR goal varies based on device and disease states. Performed at [facility], [place] [address] [place], [place] [address]   TSH     Status: None   Collection Time: 04/15/22  3:34 PM  Result Value Ref Range   TSH 1.669 0.350 - 4.500 uIU/mL    Comment: Performed by a 3rd Generation assay with a functional sensitivity of <=0.01 uIU/mL. Performed at [facility], [place] [address], [place], [place] [address]   I-Stat venous blood gas, ED     Status: Abnormal   Collection Time: 04/15/22  3:48 PM  Result Value Ref Range   pH, Ven 7.422 7.25 - 7.43   pCO2, Ven 39.6 (L) 44 - 60 mmHg   pO2, Ven 50 (H) 32 - 45 mmHg   Bicarbonate 25.8 20.0 - 28.0 mmol/L   TCO2 27 22 - 32 mmol/L   O2 Saturation 85 %   Acid-Base Excess 1.0 0.0 - 2.0 mmol/L   Sodium 141 135 - 145 mmol/L   Potassium 4.3 3.5 - 5.1 mmol/L   Calcium, Ion 1.07 (L) 1.15 - 1.40 mmol/L   HCT 27.0 (L) 36.0 - 46.0 %   Hemoglobin 9.2 (L) 12.0 - 15.0 g/dL   Sample type VENOUS   Urine rapid drug screen (hosp performed)     Status: None   Collection Time: 04/16/22  4:56 AM  Result Value Ref Range   Opiates NONE DETECTED NONE DETECTED   Cocaine NONE DETECTED NONE DETECTED   Benzodiazepines NONE DETECTED NONE DETECTED   Amphetamines NONE DETECTED NONE DETECTED   Tetrahydrocannabinol NONE DETECTED NONE  DETECTED   Barbiturates NONE DETECTED NONE DETECTED    Comment: (NOTE) DRUG SCREEN FOR MEDICAL PURPOSES ONLY.  IF CONFIRMATION IS NEEDED FOR ANY PURPOSE, NOTIFY LAB WITHIN 5 DAYS.  LOWEST DETECTABLE LIMITS FOR URINE DRUG SCREEN Drug Class                     Cutoff (ng/mL) Amphetamine and metabolites    1000 Barbiturate and metabolites    200 Benzodiazepine                 914 Tricyclics and metabolites     300 Opiates and metabolites        300 Cocaine and metabolites        300 THC                            50 Performed at [facility], [place] [address] [place], [place] [address]   Urinalysis, Routine w reflex microscopic     Status: Abnormal   Collection Time: 04/16/22  4:56 AM  Result Value Ref Range   Color, Urine STRAW (A) YELLOW   APPearance CLEAR CLEAR   Specific Gravity, Urine 1.006 1.005 - 1.030   pH 7.0 5.0 - 8.0   Glucose, UA NEGATIVE NEGATIVE mg/dL  Hgb urine dipstick NEGATIVE NEGATIVE   Bilirubin Urine NEGATIVE NEGATIVE   Ketones, ur NEGATIVE NEGATIVE mg/dL   Protein, ur 100 (A) NEGATIVE mg/dL   Nitrite NEGATIVE NEGATIVE   Leukocytes,Ua NEGATIVE NEGATIVE   RBC / HPF 0-5 0 - 5 RBC/hpf   WBC, UA 0-5 0 - 5 WBC/hpf   Bacteria, UA NONE SEEN NONE SEEN   Squamous Epithelial / LPF 0-5 0 - 5    Comment: Performed at [facility], [place] [address], [place], [place] [address]   DG Chest 2 View  Result Date: 04/16/2022 CLINICAL DATA:  Difficulty breathing, evaluate for pleural effusion EXAM: CHEST - 2 VIEW COMPARISON:  Radiographs done on 09/26/2010, CT done on 07/05/2015 FINDINGS: Transverse diameter of heart is increased. There are no signs of pulmonary edema or focal pulmonary consolidation. There is no pleural effusion or pneumothorax. IMPRESSION: Cardiomegaly. There are no signs of pulmonary edema or focal pulmonary consolidation. Electronically Signed   By: Elmer Picker M.D.   On: 04/16/2022 14:15   ECHOCARDIOGRAM COMPLETE BUBBLE STUDY  Result  Date: 04/16/2022    ECHOCARDIOGRAM REPORT   Patient Name:   Gina Bailey Date of Exam: 04/16/2022 Medical Rec #:  [id number]       Height:       65.0 in Accession #:    [id number]      Weight:       174.6 lb Date of Birth:  1945/10/26        BSA:          1.867 m Patient Age:    31 years        BP:           163/87 mmHg Patient Gender: F               HR:           78 bpm. Exam Location:  Inpatient Procedure: 2D Echo and Saline Contrast Bubble Study Indications:    stroke  History:        Patient has prior history of Echocardiogram examinations, most                 recent 09/20/2020. Signs/Symptoms:Murmur; Risk Factors:Diabetes                 and Hypertension.  Sonographer:    Harvie Junior Referring Phys: [place]  1. Left ventricular ejection fraction, by estimation, is 60 to 65%. The left ventricle has normal function. The left ventricle has no regional wall motion abnormalities. There is moderate left ventricular hypertrophy. Left ventricular diastolic parameters are indeterminate.  2. Right ventricular systolic function is normal. The right ventricular size is normal. There is normal pulmonary artery systolic pressure.  3. Large pleural effusion.  4. The mitral valve is normal in structure. No evidence of mitral valve regurgitation.  5. The aortic valve is tricuspid. Aortic valve regurgitation is not visualized.  6. The inferior vena cava is normal in size with greater than 50% respiratory variability, suggesting right atrial pressure of 3 mmHg.  7. Agitated saline contrast bubble study was negative, with no evidence of any interatrial shunt. Comparison(s): No significant change from prior study. FINDINGS  Left Ventricle: Left ventricular ejection fraction, by estimation, is 60 to 65%. The left ventricle has normal function. The left ventricle has no regional wall motion abnormalities. The left ventricular internal cavity size was normal in size. There is  moderate left ventricular  hypertrophy. Left ventricular  diastolic parameters are indeterminate. Right Ventricle: The right ventricular size is normal. Right vetricular wall thickness was not well visualized. Right ventricular systolic function is normal. There is normal pulmonary artery systolic pressure. The tricuspid regurgitant velocity is 2.59 m/s, and with an assumed right atrial pressure of 3 mmHg, the estimated right ventricular systolic pressure is 36.6 mmHg. Left Atrium: Left atrial size was normal in size. Right Atrium: Right atrial size was normal in size. Pericardium: There is no evidence of pericardial effusion. Mitral Valve: The mitral valve is normal in structure. No evidence of mitral valve regurgitation. Tricuspid Valve: The tricuspid valve is normal in structure. Tricuspid valve regurgitation is not demonstrated. Aortic Valve: The aortic valve is tricuspid. Aortic valve regurgitation is not visualized. Pulmonic Valve: The pulmonic valve was normal in structure. Pulmonic valve regurgitation is not visualized. Aorta: The aortic root and ascending aorta are structurally normal, with no evidence of dilitation. Venous: The inferior vena cava is normal in size with greater than 50% respiratory variability, suggesting right atrial pressure of 3 mmHg. IAS/Shunts: The interatrial septum was not well visualized. Agitated saline contrast was given intravenously to evaluate for intracardiac shunting. Agitated saline contrast bubble study was negative, with no evidence of any interatrial shunt. Additional Comments: There is a large pleural effusion.  LEFT VENTRICLE PLAX 2D LVIDd:         4.20 cm      Diastology LVIDs:         3.00 cm      LV e' medial:    6.96 cm/s LV PW:         1.30 cm      LV E/e' medial:  14.4 LV IVS:        1.30 cm      LV e' lateral:   8.38 cm/s LVOT diam:     1.90 cm      LV E/e' lateral: 11.9 LV SV:         84 LV SV Index:   45 LVOT Area:     2.84 cm  LV Volumes (MOD) LV vol d, MOD A2C: 74.4 ml LV vol d, MOD  A4C: 116.0 ml LV vol s, MOD A2C: 33.5 ml LV vol s, MOD A4C: 47.9 ml LV SV MOD A2C:     40.9 ml LV SV MOD A4C:     116.0 ml LV SV MOD BP:      55.5 ml RIGHT VENTRICLE RV Basal diam:  3.50 cm RV Mid diam:    2.50 cm RV S prime:     14.10 cm/s TAPSE (M-mode): 2.8 cm LEFT ATRIUM             Index        RIGHT ATRIUM           Index LA diam:        3.30 cm 1.77 cm/m   RA Area:     11.40 cm LA Vol (A2C):   38.8 ml 20.78 ml/m  RA Volume:   21.60 ml  11.57 ml/m LA Vol (A4C):   59.9 ml 32.08 ml/m LA Biplane Vol: 50.4 ml 26.99 ml/m  AORTIC VALVE             PULMONIC VALVE LVOT Vmax:   131.00 cm/s PV Vmax:       1.10 m/s LVOT Vmean:  90.600 cm/s PV Peak grad:  4.8 mmHg LVOT VTI:    0.295 m  AORTA Ao Root diam: 3.20 cm Ao Asc  diam:  3.30 cm MITRAL VALVE                TRICUSPID VALVE MV Area (PHT): 4.39 cm     TR Peak grad:   26.8 mmHg MV Decel Time: 173 msec     TR Vmax:        259.00 cm/s MR Peak grad: 55.8 mmHg MR Vmax:      373.33 cm/s   SHUNTS MV E velocity: 100.00 cm/s  Systemic VTI:  0.30 m MV A velocity: 95.60 cm/s   Systemic Diam: 1.90 cm MV E/A ratio:  1.05 Landscape architect signed by Phineas Inches Signature Date/Time: 04/16/2022/10:45:17 AM    Final    MR BRAIN WO CONTRAST  Result Date: 04/16/2022 CLINICAL DATA:  Left-sided facial droop EXAM: MRI HEAD WITHOUT CONTRAST MRA HEAD WITHOUT CONTRAST MRA NECK WITHOUT CONTRAST TECHNIQUE: Multiplanar, multi-echo pulse sequences of the brain and surrounding structures were acquired without intravenous contrast. Angiographic images of the Circle of Willis were acquired using MRA technique without intravenous contrast. Angiographic images of the neck were acquired using MRA technique without intravenous contrast. Carotid stenosis measurements (when applicable) are obtained utilizing NASCET criteria, using the distal internal carotid diameter as the denominator. COMPARISON:  No prior MRI, correlation is made with CT head 04/15/2022 FINDINGS: MRI HEAD FINDINGS  Evaluation is limited by motion. Brain: Restricted diffusion with ADC correlate in the right posterior limb of the internal capsule (series 5, image 78) and inferior posterior right lentiform nucleus (series 5, image 76). Additional small area of restricted diffusion with ADC correlate posterior to the left occipital horn in the left occipital lobe periventricular white matter. No acute hemorrhage, mass, mass effect, or midline shift. No hydrocephalus or extra-axial collection. Confluent T2 hyperintense signal in the periventricular white matter and pons, likely the sequela of moderate to severe chronic small vessel ischemic disease. Remote lacunar infarcts in the right thalamus, bilateral basal ganglia, and right cerebellum. No hemosiderin deposition to suggest remote hemorrhage. Vascular: Please see MRA findings below. Skull and upper cervical spine: Normal marrow signal. Sinuses/Orbits: No acute finding. Other: The mastoids are well aerated. MRA HEAD FINDINGS Evaluation is limited by motion. Anterior circulation: Both internal carotid arteries are grossly patent to the termini, although evaluation for stenosis is limited by motion. Flow is grossly seen in the A1 and A2 segments. The bilateral M1 segments appear patent. Grossly normal flow in the bilateral M2 and M3 branches. Posterior circulation: Vertebral arteries grossly patent to the vertebrobasilar junction. Basilar patent to its distal aspect. PCAs grossly perfused. MRA NECK FINDINGS Evaluation is limited by motion. Aortic arch: Poorly visualized. Right carotid system: No evidence of hemodynamically significant stenosis at the bifurcation and in the proximal ICA. Left carotid system: The bifurcation is poorly visualized secondary to motion. No evidence of hemodynamically significant stenosis in the ICA. Vertebral arteries: Poor visualization of the V1 segments. The V2 segments appear to be without significant stenosis. Other: Degenerative changes in the  cervical spine. IMPRESSION: 1. Evaluation is limited by motion. Within this limitation, there is an acute infarct in the right posterior limb of the internal capsule and posteroinferior right lentiform nucleus. 2. Additional acute infarct in the occipital lobe periventricular white matter posterior to the left occipital horn. 3. Evaluation of the intracranial vasculature is significantly motion limited. Within this limitation, no intracranial large vessel occlusion or significant stenosis. 4. No hemodynamically significant stenosis in the neck, although evaluation is limited by motion. Electronically Signed   By: Bryson Ha  Vasan M.D.   On: 04/16/2022 02:58   MR ANGIO NECK WO CONTRAST  Result Date: 04/16/2022 CLINICAL DATA:  Left-sided facial droop EXAM: MRI HEAD WITHOUT CONTRAST MRA HEAD WITHOUT CONTRAST MRA NECK WITHOUT CONTRAST TECHNIQUE: Multiplanar, multi-echo pulse sequences of the brain and surrounding structures were acquired without intravenous contrast. Angiographic images of the Circle of Willis were acquired using MRA technique without intravenous contrast. Angiographic images of the neck were acquired using MRA technique without intravenous contrast. Carotid stenosis measurements (when applicable) are obtained utilizing NASCET criteria, using the distal internal carotid diameter as the denominator. COMPARISON:  No prior MRI, correlation is made with CT head 04/15/2022 FINDINGS: MRI HEAD FINDINGS Evaluation is limited by motion. Brain: Restricted diffusion with ADC correlate in the right posterior limb of the internal capsule (series 5, image 78) and inferior posterior right lentiform nucleus (series 5, image 76). Additional small area of restricted diffusion with ADC correlate posterior to the left occipital horn in the left occipital lobe periventricular white matter. No acute hemorrhage, mass, mass effect, or midline shift. No hydrocephalus or extra-axial collection. Confluent T2 hyperintense signal  in the periventricular white matter and pons, likely the sequela of moderate to severe chronic small vessel ischemic disease. Remote lacunar infarcts in the right thalamus, bilateral basal ganglia, and right cerebellum. No hemosiderin deposition to suggest remote hemorrhage. Vascular: Please see MRA findings below. Skull and upper cervical spine: Normal marrow signal. Sinuses/Orbits: No acute finding. Other: The mastoids are well aerated. MRA HEAD FINDINGS Evaluation is limited by motion. Anterior circulation: Both internal carotid arteries are grossly patent to the termini, although evaluation for stenosis is limited by motion. Flow is grossly seen in the A1 and A2 segments. The bilateral M1 segments appear patent. Grossly normal flow in the bilateral M2 and M3 branches. Posterior circulation: Vertebral arteries grossly patent to the vertebrobasilar junction. Basilar patent to its distal aspect. PCAs grossly perfused. MRA NECK FINDINGS Evaluation is limited by motion. Aortic arch: Poorly visualized. Right carotid system: No evidence of hemodynamically significant stenosis at the bifurcation and in the proximal ICA. Left carotid system: The bifurcation is poorly visualized secondary to motion. No evidence of hemodynamically significant stenosis in the ICA. Vertebral arteries: Poor visualization of the V1 segments. The V2 segments appear to be without significant stenosis. Other: Degenerative changes in the cervical spine. IMPRESSION: 1. Evaluation is limited by motion. Within this limitation, there is an acute infarct in the right posterior limb of the internal capsule and posteroinferior right lentiform nucleus. 2. Additional acute infarct in the occipital lobe periventricular white matter posterior to the left occipital horn. 3. Evaluation of the intracranial vasculature is significantly motion limited. Within this limitation, no intracranial large vessel occlusion or significant stenosis. 4. No hemodynamically  significant stenosis in the neck, although evaluation is limited by motion. Electronically Signed   By: Merilyn Baba M.D.   On: 04/16/2022 02:58   MR ANGIO HEAD WO CONTRAST  Result Date: 04/16/2022 CLINICAL DATA:  Left-sided facial droop EXAM: MRI HEAD WITHOUT CONTRAST MRA HEAD WITHOUT CONTRAST MRA NECK WITHOUT CONTRAST TECHNIQUE: Multiplanar, multi-echo pulse sequences of the brain and surrounding structures were acquired without intravenous contrast. Angiographic images of the Circle of Willis were acquired using MRA technique without intravenous contrast. Angiographic images of the neck were acquired using MRA technique without intravenous contrast. Carotid stenosis measurements (when applicable) are obtained utilizing NASCET criteria, using the distal internal carotid diameter as the denominator. COMPARISON:  No prior MRI, correlation is made with CT  head 04/15/2022 FINDINGS: MRI HEAD FINDINGS Evaluation is limited by motion. Brain: Restricted diffusion with ADC correlate in the right posterior limb of the internal capsule (series 5, image 78) and inferior posterior right lentiform nucleus (series 5, image 76). Additional small area of restricted diffusion with ADC correlate posterior to the left occipital horn in the left occipital lobe periventricular white matter. No acute hemorrhage, mass, mass effect, or midline shift. No hydrocephalus or extra-axial collection. Confluent T2 hyperintense signal in the periventricular white matter and pons, likely the sequela of moderate to severe chronic small vessel ischemic disease. Remote lacunar infarcts in the right thalamus, bilateral basal ganglia, and right cerebellum. No hemosiderin deposition to suggest remote hemorrhage. Vascular: Please see MRA findings below. Skull and upper cervical spine: Normal marrow signal. Sinuses/Orbits: No acute finding. Other: The mastoids are well aerated. MRA HEAD FINDINGS Evaluation is limited by motion. Anterior circulation:  Both internal carotid arteries are grossly patent to the termini, although evaluation for stenosis is limited by motion. Flow is grossly seen in the A1 and A2 segments. The bilateral M1 segments appear patent. Grossly normal flow in the bilateral M2 and M3 branches. Posterior circulation: Vertebral arteries grossly patent to the vertebrobasilar junction. Basilar patent to its distal aspect. PCAs grossly perfused. MRA NECK FINDINGS Evaluation is limited by motion. Aortic arch: Poorly visualized. Right carotid system: No evidence of hemodynamically significant stenosis at the bifurcation and in the proximal ICA. Left carotid system: The bifurcation is poorly visualized secondary to motion. No evidence of hemodynamically significant stenosis in the ICA. Vertebral arteries: Poor visualization of the V1 segments. The V2 segments appear to be without significant stenosis. Other: Degenerative changes in the cervical spine. IMPRESSION: 1. Evaluation is limited by motion. Within this limitation, there is an acute infarct in the right posterior limb of the internal capsule and posteroinferior right lentiform nucleus. 2. Additional acute infarct in the occipital lobe periventricular white matter posterior to the left occipital horn. 3. Evaluation of the intracranial vasculature is significantly motion limited. Within this limitation, no intracranial large vessel occlusion or significant stenosis. 4. No hemodynamically significant stenosis in the neck, although evaluation is limited by motion. Electronically Signed   By: Merilyn Baba M.D.   On: 04/16/2022 02:58   CT HEAD WO CONTRAST  Result Date: 04/15/2022 CLINICAL DATA:  Left-sided facial droop EXAM: CT HEAD WITHOUT CONTRAST TECHNIQUE: Contiguous axial images were obtained from the base of the skull through the vertex without intravenous contrast. RADIATION DOSE REDUCTION: This exam was performed according to the departmental dose-optimization program which includes  automated exposure control, adjustment of the mA and/or kV according to patient size and/or use of iterative reconstruction technique. COMPARISON:  None Available. FINDINGS: Brain: No acute territorial infarction, hemorrhage or intracranial mass. Moderate white matter hypodensity likely chronic small vessel ischemic change. 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 Nonenlarged ventricles Vascular: No hyperdense vessels.  Carotid vascular calcification Skull: Normal. Negative for fracture or focal lesion. Sinuses/Orbits: No acute finding.  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 No CT evidence for acute intracranial abnormality. 2. Chronic small vessel ischemic changes of the white matter Electronically Signed   By: Donavan Foil M.D.   On: 04/15/2022 17:03    Pending Labs Unresulted Labs (From admission, onward)     Start     Ordered   04/17/22 0500  Renal function panel  Tomorrow morning,   R        04/16/22  0453   04/17/22 0500  Ferritin  Tomorrow morning,   R        04/16/22 0927   04/17/22 0500  Iron and TIBC  Tomorrow morning,   R        04/16/22 0927   04/17/22 0500  Reticulocytes  Tomorrow morning,   R        04/16/22 0927   04/17/22 0500  CBC  Tomorrow morning,   R        04/16/22 1325   04/17/22 0500  Folate  (Dementia Panel (PNL))  Tomorrow morning,   R        04/16/22 1341   04/17/22 0500  Vitamin B12  (Dementia Panel (PNL))  Tomorrow morning,   R        04/16/22 1341   04/17/22 0500  RPR  (Dementia Panel (PNL))  Tomorrow morning,   R        04/16/22 1341   04/17/22 0500  Sedimentation rate  (Dementia Panel (PNL))  Tomorrow morning,   R        04/16/22 1341   04/15/22 1520  Resp Panel by RT-PCR (Flu A&B, Covid) Anterior Nasal Swab  (Asymptomatic - Covid)  Once,   URGENT        04/15/22 1520            Vitals/Pain Today's Vitals   04/16/22 1445 04/16/22 1500 04/16/22 1600 04/16/22 1603  BP:  (!) 148/74 (!) 155/85   Pulse: 74  72 77   Resp: 16 18 (!) 25   Temp:    98.8 F (37.1 C)  TempSrc:    Oral  SpO2: 100% 97% 100%   Weight:      Height:      PainSc:        Isolation Precautions No active isolations  Medications Medications   stroke: early stages of recovery book (has no administration in time range)  acetaminophen (TYLENOL) tablet 650 mg (has no administration in time range)    Or  acetaminophen (TYLENOL) 160 MG/5ML solution 650 mg (has no administration in time range)    Or  acetaminophen (TYLENOL) suppository 650 mg (has no administration in time range)  senna-docusate (Senokot-S) tablet 1 tablet (has no administration in time range)  heparin injection 5,000 Units (5,000 Units Subcutaneous Given 04/16/22 1424)  calcitRIOL (ROCALTROL) capsule 0.25 mcg (has no administration in time range)  furosemide (LASIX) tablet 80 mg (80 mg Oral Given 04/16/22 0913)  aspirin EC tablet 81 mg (81 mg Oral Given 04/16/22 0912)  clopidogrel (PLAVIX) tablet 300 mg (300 mg Oral Given 04/16/22 0547)    And  clopidogrel (PLAVIX) tablet 75 mg (has no administration in time range)  carbamide peroxide (DEBROX) 6.5 % OTIC (EAR) solution 5 drop (5 drops Both EARS Not Given 04/16/22 1422)  rosuvastatin (CRESTOR) tablet 20 mg (has no administration in time range)    Mobility walks with person assist High fall risk   Focused Assessments    R Recommendations: See Admitting Provider Note  Report given to:   Additional Notes:

## 2022-04-16 NOTE — Progress Notes (Addendum)
NAME:  Gina Bailey, MRN:  [id number], DOB:  1945-10-31, LOS: 0 ADMISSION DATE:  04/15/2022  Subjective  NAEON  Patient evaluated at bedside this AM. Patient's daughter was also in the room. She is feeling "alright". Reports fair sleep. Patient denies feeling pain, weakness, vision changes. Her daughter feels taht her speech is more slurred today. Patient is alert and oriented x4, knowing her name, the date, location, and situation.  Objective   Blood pressure (!) 144/80, pulse 73, temperature 98.1 F (36.7 C), temperature source Oral, resp. rate 20, height '5\' 5"'$  (1.651 m), weight 79.2 kg, last menstrual period 10/22/1978, SpO2 95 %.    No intake or output data in the 24 hours ending 04/16/22 0701 Filed Weights   04/15/22 1512  Weight: 79.2 kg   Physical Exam: General: Pleasant, well-appearing in bed. No acute distress. CV: RRR. No rubs, or gallops. LE edema present. Resp: Normal effort. Mild rhonchi heard b/l at the bases of lungs. Abdominal: Soft, nontender, nondistended. Normal bowel sounds. Extremities: Radial and DP pulses 2+ and symmetric. Normal ROM. Skin: Warm and dry. No obvious rash or lesions. Neuro: A&Ox4, knows her name, the date, location, and situation. Right sided facial droop present. CN II-XII intact. No signs of cerebellar deficits. Moves all extremities. 5/5 strength in upper and lower extremities. Normal sensation. No focal deficit noted on exam.    Labs       Latest Ref Rng & Units 04/15/2022    3:48 PM 04/15/2022    3:34 PM 03/10/2022    8:07 AM  CBC  WBC 4.0 - 10.5 K/uL  7.6    Hemoglobin 12.0 - 15.0 g/dL 9.2  8.8  10.5   Hematocrit 36.0 - 46.0 % 27.0  29.4  31.0   Platelets 150 - 400 K/uL  345        Latest Ref Rng & Units 04/15/2022    3:48 PM 04/15/2022    3:34 PM 04/09/2022    9:45 AM  BMP  Glucose 70 - 99 mg/dL  109  107   BUN 8 - 23 mg/dL  51  58   Creatinine 0.44 - 1.00 mg/dL  5.18  4.60   BUN/Creat Ratio 12 - 28   13   Sodium 135 - 145  mmol/L 141  143  146   Potassium 3.5 - 5.1 mmol/L 4.3  4.3  4.4   Chloride 98 - 111 mmol/L  109  105   CO2 22 - 32 mmol/L  26  25   Calcium 8.9 - 10.3 mg/dL  8.5  8.9    Echo  1. Left ventricular ejection fraction, by estimation, is 60 to 65%. The left ventricle has normal function. The left ventricle has no regional wall motion abnormalities. There is moderate left ventricular hypertrophy. Left ventricular diastolic parameters are indeterminate.   2. Right ventricular systolic function is normal. The right ventricular  size is normal. There is normal pulmonary artery systolic pressure.   3. Large pleural effusion.   4. The mitral valve is normal in structure. No evidence of mitral valve regurgitation.   5. The aortic valve is tricuspid. Aortic valve regurgitation is not  visualized.   6. The inferior vena cava is normal in size with greater than 50%  respiratory variability, suggesting right atrial pressure of 3 mmHg.   CXR Cardiomegaly. There are no signs of pulmonary edema or focal pulmonary consolidation.  Summary  [name] is a 76yo woman with a history  of T2DM with neuropathy, poorly controlled HTN, advanced CKD with new RUE AVF, HLD, former smoker, MR, diastolic HF presenting ED for stroke-like symptoms found to have an acute stroke  Assessment & Plan:  Principal Problem:   Acute ischemic stroke ([place])  Acute subcortical CVA Patient presenting with L sided facial droop and dysarthria found to have an acute stroke. MRA brain and cervical significant for acute infarct in R posterior limb of the IC and inferior posterior R lentiform nucleus. TTE does not show embolization. -Neurology consulted, appreciate recs -Plavix 300 mg load -Plavix 75 mg and ASA '81mg'$  daily -SLP, PT, OT consulted -Telemetry   Pleural Effusion Patient's echo on 9/28 showed large pleural effusion. CXR shows no signs of pulmonary edema or pulmonary consolidation though this imaging method to find  pleural effusion is not the most accurate. Pleural effusion is most likely due to her CKD causing volume retention. Since she is currently on RA, denying SOB and is able to ambulate, no medication changes will be made at this time. On exam, patient has LE edema.  -CTM  HLD 9/21 T cholesterol 223, LDL 135. Given CVA, patient should be started on high intensity statin for secondary prevention with LDL goal <70 -Neurology increased Crestor from 10 mg to 20 mg for secondary prevention, goal LDL <70   HTN SBP 150-180s during this hospitalization. Home meds include amlodipine '10mg'$ , carvedilol 6.25 mg BID, lasix '80mg'$  daily with better controlled per last OV note. Plan to resume home medications slowly. -Restarted lasix 80 mg   CKD4 Anemia of chronic renal failure Patient followed by Dr. Royce Macadamia at [facility]. LUE AVF placed, with good thrill and bruit. Hbg is 9.2. This is likely in the setting of kidney disease.  -Monitor renal function panel -Monitor CBC -Iron studies pending   DM A1c 5.4. Diet controlled   Stormy Fabian, MD Internal Medicine Resident PGY-1 PAGER: [phone] 04/16/2022 7:01 AM  If after hours (below), please contact on-call pager: [phone] 5PM-7AM Monday-Friday 1PM-7AM Saturday-Sunday

## 2022-04-17 ENCOUNTER — Inpatient Hospital Stay (HOSPITAL_COMMUNITY): Payer: Medicare PPO

## 2022-04-17 DIAGNOSIS — R0989 Other specified symptoms and signs involving the circulatory and respiratory systems: Secondary | ICD-10-CM

## 2022-04-17 DIAGNOSIS — N185 Chronic kidney disease, stage 5: Secondary | ICD-10-CM | POA: Diagnosis not present

## 2022-04-17 DIAGNOSIS — Z87891 Personal history of nicotine dependence: Secondary | ICD-10-CM | POA: Diagnosis not present

## 2022-04-17 DIAGNOSIS — I120 Hypertensive chronic kidney disease with stage 5 chronic kidney disease or end stage renal disease: Secondary | ICD-10-CM

## 2022-04-17 DIAGNOSIS — I639 Cerebral infarction, unspecified: Secondary | ICD-10-CM | POA: Diagnosis not present

## 2022-04-17 LAB — VITAMIN B12: Vitamin B-12: 299 pg/mL (ref 180–914)

## 2022-04-17 LAB — RETICULOCYTES
Immature Retic Fract: 17.2 % — ABNORMAL HIGH (ref 2.3–15.9)
RBC.: 3.23 MIL/uL — ABNORMAL LOW (ref 3.87–5.11)
Retic Count, Absolute: 54.3 10*3/uL (ref 19.0–186.0)
Retic Ct Pct: 1.7 % (ref 0.4–3.1)

## 2022-04-17 LAB — RENAL FUNCTION PANEL
Albumin: 2.9 g/dL — ABNORMAL LOW (ref 3.5–5.0)
Anion gap: 12 (ref 5–15)
BUN: 54 mg/dL — ABNORMAL HIGH (ref 8–23)
CO2: 27 mmol/L (ref 22–32)
Calcium: 8.7 mg/dL — ABNORMAL LOW (ref 8.9–10.3)
Chloride: 106 mmol/L (ref 98–111)
Creatinine, Ser: 5.09 mg/dL — ABNORMAL HIGH (ref 0.44–1.00)
GFR, Estimated: 8 mL/min — ABNORMAL LOW (ref >60)
Glucose, Bld: 97 mg/dL (ref 70–99)
Phosphorus: 6.6 mg/dL — ABNORMAL HIGH (ref 2.5–4.6)
Potassium: 3.9 mmol/L (ref 3.5–5.1)
Sodium: 145 mmol/L (ref 135–145)

## 2022-04-17 LAB — CBC
HCT: 27.5 % — ABNORMAL LOW (ref 36.0–46.0)
Hemoglobin: 8.5 g/dL — ABNORMAL LOW (ref 12.0–15.0)
MCH: 26 pg (ref 26.0–34.0)
MCHC: 30.9 g/dL (ref 30.0–36.0)
MCV: 84.1 fL (ref 80.0–100.0)
Platelets: 343 10*3/uL (ref 150–400)
RBC: 3.27 MIL/uL — ABNORMAL LOW (ref 3.87–5.11)
RDW: 15.9 % — ABNORMAL HIGH (ref 11.5–15.5)
WBC: 6.3 10*3/uL (ref 4.0–10.5)
nRBC: 0 % (ref 0.0–0.2)

## 2022-04-17 LAB — RPR: RPR Ser Ql: NONREACTIVE

## 2022-04-17 LAB — FOLATE: Folate: 8.6 ng/mL (ref >5.9)

## 2022-04-17 LAB — FERRITIN: Ferritin: 32 ng/mL (ref 11–307)

## 2022-04-17 LAB — IRON AND TIBC
Iron: 34 ug/dL (ref 28–170)
Saturation Ratios: 13 % (ref 10.4–31.8)
TIBC: 273 ug/dL (ref 250–450)
UIBC: 239 ug/dL

## 2022-04-17 LAB — SEDIMENTATION RATE: Sed Rate: 70 mm/hr — ABNORMAL HIGH (ref 0–22)

## 2022-04-17 MED ORDER — SODIUM CHLORIDE 0.9 % IV SOLN
250.0000 mg | Freq: Every day | INTRAVENOUS | Status: AC
  Administered 2022-04-17 – 2022-04-20 (×4): 250 mg via INTRAVENOUS
  Filled 2022-04-17 (×4): qty 20

## 2022-04-17 MED ORDER — CARVEDILOL 6.25 MG PO TABS
6.2500 mg | ORAL_TABLET | Freq: Two times a day (BID) | ORAL | Status: DC
  Administered 2022-04-17 – 2022-04-20 (×7): 6.25 mg via ORAL
  Filled 2022-04-17 (×7): qty 1

## 2022-04-17 MED ORDER — AMLODIPINE BESYLATE 10 MG PO TABS
10.0000 mg | ORAL_TABLET | Freq: Every day | ORAL | Status: DC
  Administered 2022-04-17 – 2022-04-22 (×6): 10 mg via ORAL
  Filled 2022-04-17 (×6): qty 1

## 2022-04-17 MED ORDER — CLOPIDOGREL BISULFATE 75 MG PO TABS
75.0000 mg | ORAL_TABLET | Freq: Every day | ORAL | Status: DC
  Administered 2022-04-18 – 2022-04-22 (×5): 75 mg via ORAL
  Filled 2022-04-17 (×5): qty 1

## 2022-04-17 NOTE — Evaluation (Signed)
Occupational Therapy Evaluation Patient Details Name: Gina Bailey MRN: [id number] DOB: 11-30-1945 Today's Date: 04/17/2022   History of Present Illness Pt is a 76 yo F who presented 9/27 with L facial weakness and slurred speech. PMH significant for HTN, DM, HLD, CKD. MRI confirms Acute Lacunar Infarcts at the R lentiform nucleus and L posterior occipital horn   Clinical Impression   [name] presents to OT with deficits in static and dynamic standing balance, trunk control, and generalized weakness. She required (S) for UB ADLs and min-mod A for LB. Mod A for ADL transfers d/t posterior LOB. Pt would benefit from continued acute OT services to facilitate safe d/c home and optimize occupational performance. She would benefit from CIR level therapy to return to her PLOF.       Recommendations for follow up therapy are one component of a multi-disciplinary discharge planning process, led by the attending physician.  Recommendations may be updated based on patient status, additional functional criteria and insurance authorization.   Follow Up Recommendations  Acute inpatient rehab (3hours/day)    Assistance Recommended at Discharge Frequent or constant Supervision/Assistance  Patient can return home with the following A lot of help with walking and/or transfers;A lot of help with bathing/dressing/bathroom    Functional Status Assessment  Patient has had a recent decline in their functional status and demonstrates the ability to make significant improvements in function in a reasonable and predictable amount of time.  Equipment Recommendations  Tub/shower bench    Recommendations for Other Services Rehab consult     Precautions / Restrictions Precautions Precautions: Fall Restrictions Weight Bearing Restrictions: No      Mobility Bed Mobility Overal bed mobility: Needs Assistance Bed Mobility: Supine to Sit     Supine to sit: Min assist     General bed mobility comments:  extra time and cueing for sequencing    Transfers Overall transfer level: Needs assistance Equipment used: 1 person hand held assist Transfers: Sit to/from Stand, Bed to chair/wheelchair/BSC Sit to Stand: Min assist Stand pivot transfers: Mod assist         General transfer comment: Able to stand several times with as little as min A but had heavier posterior LOB requiring mod A to correct      Balance Overall balance assessment: History of Falls, Needs assistance Sitting-balance support: Feet supported Sitting balance-Leahy Scale: Fair     Standing balance support: During functional activity, Single extremity supported Standing balance-Leahy Scale: Poor Standing balance comment: Posterior LOB                           ADL either performed or assessed with clinical judgement   ADL Overall ADL's : Needs assistance/impaired Eating/Feeding: Supervision/ safety   Grooming: Supervision/safety   Upper Body Bathing: Minimal assistance   Lower Body Bathing: Moderate assistance;Sit to/from stand   Upper Body Dressing : Minimal assistance;Sitting   Lower Body Dressing: Moderate assistance;Sit to/from stand   Toilet Transfer: Moderate assistance;Ambulation   Toileting- Clothing Manipulation and Hygiene: Moderate assistance;Sit to/from stand       Functional mobility during ADLs: Moderate assistance;Cueing for safety;Cueing for sequencing General ADL Comments: Mod A for LB ADLS, (S)-min A for UB, cueing for technique     Vision Baseline Vision/History: 1 Wears glasses Ability to See in Adequate Light: 1 Impaired Patient Visual Report: No change from baseline Vision Assessment?: Yes Eye Alignment: Within Functional Limits Ocular Range of Motion: Within Functional Limits  Alignment/Gaze Preference: Gaze right Tracking/Visual Pursuits: Impaired - to be further tested in functional context Saccades: Additional eye shifts occurred during testing Convergence:  Within functional limits Visual Fields: No apparent deficits         Hand Dominance Right   Extremity/Trunk Assessment Upper Extremity Assessment Upper Extremity Assessment: Defer to OT evaluation LUE Deficits / Details: Slightly impaired overhead AROM. Strength similar to RUE. Slight coordination deficits LUE Sensation: WNL LUE Coordination: decreased fine motor;decreased gross motor   Lower Extremity Assessment Lower Extremity Assessment: LLE deficits/detail RLE Deficits / Details: WFL LLE Deficits / Details: AROM WFL, strength 4/5 throughout   Cervical / Trunk Assessment Cervical / Trunk Assessment: Kyphotic   Communication Communication Communication: HOH   Cognition Arousal/Alertness: Awake/alert Behavior During Therapy: WFL for tasks assessed/performed Overall Cognitive Status: Impaired/Different from baseline Area of Impairment: Following commands, Awareness, Safety/judgement, Memory                     Memory: Decreased short-term memory Following Commands: Follows multi-step commands with increased time Safety/Judgement: Decreased awareness of safety, Decreased awareness of deficits Awareness: Emergent   General Comments: DIL reports pt has slower processing since CVA                Home Living Family/patient expects to be discharged to:: Private residence Living Arrangements: Children Available Help at Discharge: Family Type of Home: House Home Access: Stairs to enter [employer] of Steps: 1 step up onto [place]: One level     Bathroom Shower/Tub: Teacher, early years/pre: Standard     Home Equipment: None   Additional Comments: DIL was present initially and reported family could likely make 24/7 supervision work  Lives With: Son    Prior Functioning/Environment Prior Level of Function : Independent/Modified Independent;History of Falls (last six months)             Mobility Comments: No device  but had several falls reports 2 recent falls one in parking lot over parking tie, does not drive ADLs Comments: Independent using some type of stool to get into bathroom per her report, DIL couldn't confirm        OT Problem List: Decreased strength;Decreased range of motion;Decreased activity tolerance;Impaired balance (sitting and/or standing);Impaired vision/perception;Decreased coordination;Decreased cognition;Decreased safety awareness;Decreased knowledge of use of DME or AE;Impaired UE functional use      OT Treatment/Interventions: Self-care/ADL training;Therapeutic exercise;Energy conservation;Neuromuscular education;DME and/or AE instruction;Manual therapy;Therapeutic activities;Cognitive remediation/compensation;Visual/perceptual remediation/compensation;Balance training;Patient/family education    OT Goals(Current goals can be found in the care plan section) Acute Rehab OT Goals Patient Stated Goal: Eat breakfast OT Goal Formulation: With patient Time For Goal Achievement: 04/30/22 Potential to Achieve Goals: Good ADL Goals Pt Will Perform Upper Body Bathing: with supervision;sitting Pt Will Perform Lower Body Bathing: with supervision;sit to/from stand Pt Will Perform Upper Body Dressing: with supervision;sitting Pt Will Perform Lower Body Dressing: with supervision;sit to/from stand Pt Will Transfer to Toilet: with supervision;ambulating Pt Will Perform Toileting - Clothing Manipulation and hygiene: with supervision;sit to/from stand  OT Frequency: Min 3X/week       AM-PAC OT "6 Clicks" Daily Activity     Outcome Measure Help from another person eating meals?: A Little Help from another person taking care of personal grooming?: A Little Help from another person toileting, which includes using toliet, bedpan, or urinal?: A Lot Help from another person bathing (including washing, rinsing, drying)?: A Lot Help from another person to put on and  taking off regular upper body  clothing?: A Little Help from another person to put on and taking off regular lower body clothing?: A Lot 6 Click Score: 15   End of Session Equipment Utilized During Treatment: Gait belt Nurse Communication: Mobility status  Activity Tolerance: Patient tolerated treatment well Patient left: in chair;with call bell/phone within reach;with chair alarm set  OT Visit Diagnosis: Unsteadiness on feet (R26.81);Muscle weakness (generalized) (M62.81);Repeated falls (R29.6)                Time: 9292-4462 OT Time Calculation (min): 33 min Charges:  OT General Charges $OT Visit: 1 Visit OT Evaluation $OT Eval Moderate Complexity: 1 Mod OT Treatments $Self Care/Home Management : 8-22 mins  Laverle Hobby, OTR/L, CBIS Acute Rehab Office: [phone]   Curtis Sites 04/17/2022, 3:05 PM

## 2022-04-17 NOTE — Progress Notes (Signed)
  Transition of Care [facility]) Screening Note   Patient Details  Name: Gina Bailey Date of Birth: 31-Jan-1946   Transition of Care [facility]) CM/SW Contact:    Pollie Friar, RN Phone Number: 04/17/2022, 3:59 PM   Pt is from home with son. CIR to evaluate. Transition of Care Department [facility]) has reviewed patient. We will continue to monitor patient advancement through interdisciplinary progression rounds. If new patient transition needs arise, please place a TOC consult.

## 2022-04-17 NOTE — Progress Notes (Signed)
PT Cancellation Note  Patient Details Name: [name] MRN: [id number] DOB: 28-Jan-1946   Cancelled Treatment:    Reason Eval/Treat Not Completed: Patient at procedure or test/unavailable; attempted twice, pt eating breakfast, now with vascular for ECHO.  Will attempt again   Reginia Naas 04/17/2022, 12:13 PM Magda Kiel, PT Acute Rehabilitation Services Office:[phone] 04/17/2022

## 2022-04-17 NOTE — Progress Notes (Signed)
? ?  Inpatient Rehab Admissions Coordinator : ? ?Per therapy recommendations, patient was screened for CIR candidacy by Mckenzey Parcell RN MSN.  At this time patient appears to be a potential candidate for CIR. I will place a rehab consult per protocol for full assessment. Please call me with any questions. ? ?Leaha Cuervo RN MSN ?Admissions Coordinator ?[phone] ?  ?

## 2022-04-17 NOTE — Progress Notes (Signed)
NAME:  Gina Bailey, MRN:  [id number], DOB:  12/18/1945, LOS: 1 ADMISSION DATE:  04/15/2022  Subjective  NAEON  Patient evaluated at this AM. She notes feeling good, notes good sleep and appetite. Was alert and oriented x 4 this morning.  Objective   Blood pressure (!) 173/83, pulse 73, temperature 97.9 F (36.6 C), temperature source Oral, resp. rate 18, height '5\' 5"'$  (1.651 m), weight 79.2 kg, last menstrual period 10/22/1978, SpO2 90 %.     Intake/Output Summary (Last 24 hours) at 04/17/2022 0733 Last data filed at 04/17/2022 0119 Gross per 24 hour  Intake --  Output 310 ml  Net -310 ml   Filed Weights   04/15/22 1512  Weight: 79.2 kg   Physical Exam: General: Pleasant, well-appearing sitting in a chair. No acute distress. CV: RRR. No rubs, or gallops. LE edema present. Resp: CTAB. Normal effort.  Skin: Warm and dry. No obvious rash or lesions. Neuro: A&Ox4, knows her name, the date, location, and situation. Right facial droop improving. No focal deficit on exam.    Labs       Latest Ref Rng & Units 04/17/2022    4:39 AM 04/15/2022    3:48 PM 04/15/2022    3:34 PM  CBC  WBC 4.0 - 10.5 K/uL 6.3   7.6   Hemoglobin 12.0 - 15.0 g/dL 8.5  9.2  8.8   Hematocrit 36.0 - 46.0 % 27.5  27.0  29.4   Platelets 150 - 400 K/uL 343   345       Latest Ref Rng & Units 04/17/2022    4:39 AM 04/15/2022    3:48 PM 04/15/2022    3:34 PM  BMP  Glucose 70 - 99 mg/dL 97   109   BUN 8 - 23 mg/dL 54   51   Creatinine 0.44 - 1.00 mg/dL 5.09   5.18   Sodium 135 - 145 mmol/L 145  141  143   Potassium 3.5 - 5.1 mmol/L 3.9  4.3  4.3   Chloride 98 - 111 mmol/L 106   109   CO2 22 - 32 mmol/L 27   26   Calcium 8.9 - 10.3 mg/dL 8.7   8.5      CXR Cardiomegaly. There are no signs of pulmonary edema or focal pulmonary consolidation.  Summary  Gina Bailey is a 76yo woman with a history of T2DM with neuropathy, poorly controlled HTN, advanced CKD with new RUE AVF, HLD, former smoker, MR,  diastolic HF presenting ED for stroke-like symptoms found to have an acute stroke  Assessment & Plan:  Principal Problem:   Acute ischemic stroke Gina Bailey) Active Problems:   Hyperlipidemia associated with type 2 diabetes mellitus (Gina Bailey)   Hypertension   Anemia of chronic renal failure   CKD (chronic kidney disease), stage V (Gina Bailey)  Acute subcortical CVA Patient presenting with L sided facial droop and dysarthria found to have an acute stroke. MRA brain and cervical significant for acute infarct in R posterior limb of the IC and inferior posterior R lentiform nucleus. TTE does not show embolization. Patient will need need SLP services. -Neurology consulted, appreciate recs -Plavix 300 mg load -Plavix 75 mg and ASA '81mg'$  daily - PT, OT pending -Telemetry  HTN SBP 150-180s during this hospitalization. Home meds include amlodipine '10mg'$ , carvedilol 6.25 mg BID, lasix '80mg'$  daily with better controlled per last OV note. Will restart home hypertension medication as we are outside the window of permissive HTN. -Restart  home amlodipine and carvedilol -Continue lasix 80 mg  CKD4 Anemia of chronic renal failure Patient followed by Gina [facility] at [facility]. LUE AVF placed, with good thrill and bruit. Hbg is 8.5. This is likely in the setting of kidney disease. Iron studies WNL, iron and ferritin on the lower end of normal. -Start ferric gluconate 250 mg  -Monitor renal function panel -Monitor CBC  HLD 9/21 T cholesterol 223, LDL 135. Given CVA, patient should be started on high intensity statin for secondary prevention with LDL goal <70 -Continue Crestor 20 mg for secondary prevention, goal LDL <70    Gina Fabian, MD Internal Medicine Resident PGY-1 PAGER: [phone] 04/17/2022 7:33 AM  If after hours (below), please contact on-call pager: [phone] 5PM-7AM Monday-Friday 1PM-7AM Saturday-Sunday

## 2022-04-17 NOTE — Evaluation (Signed)
Speech Language Pathology Evaluation Patient Details Name: Gina Bailey MRN: [id number] DOB: April 18, 1946 Today's Date: 04/17/2022 Time: 1023-1040 SLP Time Calculation (min) (ACUTE ONLY): 17 min  Problem List:  Patient Active Problem List   Diagnosis Date Noted   Acute ischemic stroke ([place]) 04/16/2022   CKD (chronic kidney disease), stage V ([place]) 04/16/2022   Anemia of chronic renal failure 04/09/2022   Gait instability with falls 04/09/2022   Heart murmur, systolic 40/98/1191   Diabetic neuropathy ([place]) 03/06/2022   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   Cataracts, bilateral 08/05/2021   CKD stage 4 due to type 2 diabetes mellitus ([place]) 04/08/2021   Absent pedal pulses 01/23/2021   Mitral regurgitation 11/11/2020   Aortic root dilation ([place]) 11/11/2020   Pancreas cysts, incidental finding, likely benign, f/u imaging in 2024 can be considered 08/26/2020   Preventative health care 09/04/2010   T2DM (type 2 diabetes mellitus) ([place]) 04/18/2009   Hyperlipidemia associated with type 2 diabetes mellitus ([place]) 06/24/2007   Hypertension 06/04/2006   Past Medical History:  Past Medical History:  Diagnosis Date   Chronic kidney disease    Diabetes mellitus    Diet-controlled. Not on antiglycemic medications.   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  Based on the size, patient's age, status as a former smoker, and its discovery in a community setting, her probability of malignancy is 3% (low probability).  Based on this, I recommend follow-up with a CT scan around 05/2014, and if lesion is stable or smaller, no further follow-up is needed.  CT Chest 06/2015: Stable 15m pulmonary nodule. No   Ventral hernia    Incisional ventral hernia. S/P repair by  Dr. 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 WHulen Skains (08/2006)   Past Surgical History:  Past Surgical History:  Procedure Laterality Date   APPENDECTOMY     AV FISTULA PLACEMENT Left 03/10/2022   Procedure: LEFT ARM ARTERIOVENOUS (AV) FISTULA CREATION VERSUS GRAFT;  Surgeon: CWaynetta Sandy MD;  [facility]  Service: Vascular;  Laterality: Left;   COLON SURGERY  08/2006   Partial right colectomy and anastomosis with resection of villous adenoma.   HEMORRHOID SURGERY     hemorrhoidectomy   TONSILLECTOMY     TUBAL LIGATION     VENTRAL HERNIA REPAIR  08/2008   By Dr. WHulen Skains   HPI:  Pt is a 76y/o female who presented with left-sided facial droop and dysarthria. Per neurology's note, pt's family states that the pt's mentation has been in decline for some time and that that patient has been non-adherent to home medications due to poor memory. MRI brain: acute infarct in the right posterior limb of the internal capsule and posteroinferior right lentiform nucleus. Additional acute infarct in the occipital lobe periventricular white matter posterior to the left occipital horn. PMH: T2DM with neuropathy, poorly controlled HTN, ESRD with AVF LUE, HLD, former smoker, MR, diastolic HF.   Assessment / Plan / Recommendation Clinical Impression  Pt participated in speech-language-cognition evaluation with her son and daughter present. Pt resides  with her son, completed some college, and is retired. Pt and her children reported impairments in memory, attention, and topic maintenance which have been worsening for some years. Pt's family described the pt's speech as "slurred" and stated that it is "definitely different from baseline". The [facility] Mental Status Examination was completed to evaluate the pt's cognitive-linguistic  skills. She achieved a score of 13/30 which is below the normal limits of 27 or more out of 30. She exhibited difficulty in the areas of awareness, attention, memory, problem solving, and executive function. She also presented with mild to moderate dysarthria characterized by reduced articulatory precision which negatively impacted speech intelligibility at the conversational level. Per the family, pt's cognitive performance is at baseline. Pt's family advised that the pt's declining cognition has been discussed with the pt's doctor and that pt is scheduled for further cognitive evaluation in November. SLP is in agreement with this plan to determine etiology of pt's cognitive decline. Skilled SLP services are clinically indicated at this time to improve motor speech skills.    SLP Assessment  SLP Recommendation/Assessment: Patient needs continued Speech Lanaguage Pathology Services SLP Visit Diagnosis: Cognitive communication deficit (R41.841);Dysarthria and anarthria (R47.1)    Recommendations for follow up therapy are one component of a multi-disciplinary discharge planning process, led by the attending physician.  Recommendations may be updated based on patient status, additional functional criteria and insurance authorization.    Follow Up Recommendations   (Continued SLP services at level of care recommended by PT/OT)    Assistance Recommended at Discharge  Frequent or constant Supervision/Assistance  Functional Status Assessment Patient has had a recent decline in their functional status and demonstrates the ability to make significant improvements in function in a reasonable and predictable amount of time.  Frequency and Duration min 2x/week  2 weeks      SLP Evaluation Cognition  Overall Cognitive Status: History of cognitive impairments - at baseline Arousal/Alertness: Awake/alert Orientation Level: Oriented to person;Oriented to place;Disoriented to time;Disoriented to  situation Year: 2023 Month: September Day of Week: Incorrect Attention: Focused;Sustained;Selective Focused Attention: Appears intact Sustained Attention: Appears intact Selective Attention: Impaired Selective Attention Impairment: Verbal complex Memory: Impaired Memory Impairment: Decreased short term memory;Decreased recall of new information (Immediate: 5/5; delayed: 0/5; with cues: 3/5) Awareness: Impaired Awareness Impairment: Intellectual impairment Problem Solving: Impaired Problem Solving Impairment: Verbal complex (money: 0/3; time: 0/1) Executive Function: Sequencing;Organizing Sequencing: Impaired Sequencing Impairment: Verbal complex (clock: 2/4; verbal sequencing: multiple steps missing) Organizing: Impaired Organizing Impairment: Verbal complex (backward digit span: 1/2)       Comprehension  Auditory Comprehension Overall Auditory Comprehension: Appears within functional limits for tasks assessed Yes/No Questions: Within Functional Limits Commands: Within Functional Limits (additional processing time necessary) Interfering Components: Attention;Working memory;Processing speed    Expression Expression Primary Mode of Expression: Verbal Verbal Expression Initiation: No impairment Level of Generative/Spontaneous Verbalization: Conversation Repetition: No impairment Naming: No impairment Pragmatics: Impairment Impairments: Topic maintenance Interfering Components: Attention   Oral / Motor  Oral Motor/Sensory Function Overall Oral Motor/Sensory Function: Mild impairment Facial ROM: Reduced left;Suspected CN VII (facial) dysfunction Facial Symmetry: Abnormal symmetry left;Suspected CN VII (facial) dysfunction Facial Strength: Reduced left;Suspected CN VII (facial) dysfunction Lingual ROM: Reduced left;Reduced right;Suspected CN XII (hypoglossal) dysfunction Lingual Strength: Reduced;Suspected CN XII (hypoglossal) dysfunction Motor Speech Overall Motor Speech:  Impaired Respiration: Within functional limits Phonation: Normal Resonance: Within functional limits Articulation: Impaired Level of Impairment: Conversation Intelligibility: Intelligibility reduced Word: 75-100% accurate Phrase: 75-100% accurate Sentence: 75-100% accurate Conversation: 75-100%  accurate Motor Planning: Witnin functional limits Motor Speech Errors: Aware           Gina Bailey, [place], [place] Office number [phone]  Horton Marshall 04/17/2022, 11:34 AM

## 2022-04-17 NOTE — Evaluation (Signed)
Physical Therapy Evaluation Patient Details Name: Gina Bailey MRN: [id number] DOB: Nov 18, 1945 Today's Date: 04/17/2022  History of Present Illness  Pt is a 76 yo F who presented 9/27 with L facial weakness and slurred speech. PMH significant for HTN, DM, HLD, CKD. MRI confirms Acute Lacunar Infarcts at the R lentiform nucleus and L posterior occipital horn  Clinical Impression  Patient presents with decreased L side awareness, decreased balance, decreased strength and high fall risk limiting independence and safety.  She was previously walking without device at home and able to complete ADL's on her own, though daughter reports they had planned to get her a walker due to two recent falls.  She ambulated with min A with RW in hallway with L lateral lean and flexed posture, she reported feeling heavy on L side and demonstrated difficulty finding toilet paper and grabbar on wall on L side.  She will benefit from skilled PT in the acute setting and from acute inpatient rehab prior to d/c home.      Recommendations for follow up therapy are one component of a multi-disciplinary discharge planning process, led by the attending physician.  Recommendations may be updated based on patient status, additional functional criteria and insurance authorization.  Follow Up Recommendations Acute inpatient rehab (3hours/day)      Assistance Recommended at Discharge Frequent or constant Supervision/Assistance  Patient can return home with the following  A little help with walking and/or transfers;A little help with bathing/dressing/bathroom;Assistance with cooking/housework;Help with stairs or ramp for entrance;Direct supervision/assist for medications management;Assist for transportation    Equipment Recommendations Rolling walker (2 wheels)  Recommendations for Other Services  Rehab consult    Functional Status Assessment Patient has had a recent decline in their functional status and demonstrates the  ability to make significant improvements in function in a reasonable and predictable amount of time.     Precautions / Restrictions Precautions Precautions: Fall      Mobility  Bed Mobility Overal bed mobility: Needs Assistance Bed Mobility: Supine to Sit     Supine to sit: Min assist     General bed mobility comments: increased time, heavy use of rail. assist to scoot hips to EOB    Transfers Overall transfer level: Needs assistance Equipment used: 1 person hand held assist, Rolling walker (2 wheels) Transfers: Sit to/from Stand Sit to Stand: Min assist           General transfer comment: stood with A for balance, pt reaching for UE support    Ambulation/Gait Ambulation/Gait assistance: Min assist Gait Distance (Feet): 90 Feet (&15') Assistive device: Rolling walker (2 wheels) Gait Pattern/deviations: Step-to pattern, Decreased step length - left, Drifts right/left, Trunk flexed       General Gait Details: leaning L and reports L side heavy, turns both ways in hallway with increased time, cues for technique, assist for balance with L lateral lean  Stairs            Wheelchair Mobility    Modified Rankin (Stroke Patients Only) Modified Rankin (Stroke Patients Only) Pre-Morbid Rankin Score: Moderate disability Modified Rankin: Moderately severe disability     Balance Overall balance assessment: Needs assistance, History of Falls Sitting-balance support: Feet supported Sitting balance-Leahy Scale: Good Sitting balance - Comments: leaning on toilet for hygiene after BM   Standing balance support: During functional activity, Reliant on assistive device for balance Standing balance-Leahy Scale: Poor Standing balance comment: UE support leaning on elbows for balance washing hands, stood without support with  wide BOS and CGA                             Pertinent Vitals/Pain Pain Assessment Pain Assessment: No/denies pain    Home Living  Family/patient expects to be discharged to:: Private residence Living Arrangements: Children Available Help at Discharge: Family Type of Home: House Home Access: Stairs to enter   [employer] of Steps: 1 step up onto [place]: One level Home Equipment: None Additional Comments: DIL was present initially and reported family could likely make 24/7 supervision work    Prior Function Prior Level of Function : Independent/Modified Independent;History of Falls (last six months)             Mobility Comments: No device but had several falls reports 2 recent falls one in parking lot over parking tie, does not drive ADLs Comments: Independent using some type of stool to get into bathroom per her report, DIL couldn't confirm     Hand Dominance   Dominant Hand: Right    Extremity/Trunk Assessment   Upper Extremity Assessment Upper Extremity Assessment: Defer to OT evaluation    Lower Extremity Assessment Lower Extremity Assessment: LLE deficits/detail RLE Deficits / Details: WFL LLE Deficits / Details: AROM WFL, strength 4/5 throughout    Cervical / Trunk Assessment Cervical / Trunk Assessment: Kyphotic  Communication   Communication: HOH  Cognition Arousal/Alertness: Awake/alert Behavior During Therapy: WFL for tasks assessed/performed Overall Cognitive Status: Impaired/Different from baseline Area of Impairment: Following commands, Awareness, Safety/judgement, Memory, Problem solving                     Memory: Decreased short-term memory Following Commands: Follows multi-step commands with increased time Safety/Judgement: Decreased awareness of safety, Decreased awareness of deficits   Problem Solving: Slow processing, Decreased initiation, Requires verbal cues          General Comments General comments (skin integrity, edema, etc.): daughter in the room and requesting in house rehab; toileted in bathroom needing cues for finding  grabbar for safety stand to sit and for finding toilet paper on L side    Exercises     Assessment/Plan    PT Assessment Patient needs continued PT services  PT Problem List Decreased strength;Decreased mobility;Decreased safety awareness;Decreased balance;Decreased knowledge of use of DME;Decreased cognition       PT Treatment Interventions DME instruction;Therapeutic exercise;Gait training;Balance training;Neuromuscular re-education;Functional mobility training;Therapeutic activities;Patient/family education;Cognitive remediation    PT Goals (Current goals can be found in the Care Plan section)  Acute Rehab PT Goals Patient Stated Goal: to be as independent as she can be PT Goal Formulation: With patient/family Time For Goal Achievement: 04/30/22 Potential to Achieve Goals: Good    Frequency Min 4X/week     Co-evaluation               AM-PAC PT "6 Clicks" Mobility  Outcome Measure Help needed turning from your back to your side while in a flat bed without using bedrails?: A Little Help needed moving from lying on your back to sitting on the side of a flat bed without using bedrails?: A Little Help needed moving to and from a bed to a chair (including a wheelchair)?: A Little Help needed standing up from a chair using your arms (e.g., wheelchair or bedside chair)?: A Little Help needed to walk in hospital room?: A Little Help needed climbing 3-5 steps with a railing? : Total  6 Click Score: 16    End of Session Equipment Utilized During Treatment: Gait belt Activity Tolerance: Patient tolerated treatment well Patient left: in bed;with call bell/phone within reach;with family/visitor present;with bed alarm set   PT Visit Diagnosis: Other abnormalities of gait and mobility (R26.89);Other symptoms and signs involving the nervous system (R29.898);History of falling (Z91.81);Hemiplegia and hemiparesis Hemiplegia - Right/Left: Left Hemiplegia - dominant/non-dominant:  Non-dominant Hemiplegia - caused by: Cerebral infarction    Time: 3543-0148 PT Time Calculation (min) (ACUTE ONLY): 41 min   Charges:   PT Evaluation $PT Eval Moderate Complexity: 1 Mod PT Treatments $Gait Training: 8-22 mins $Therapeutic Activity: 8-22 mins        Magda Kiel, PT Acute Rehabilitation Services [phone] 04/17/2022   Reginia Naas 04/17/2022, 3:58 PM

## 2022-04-17 NOTE — Progress Notes (Signed)
STROKE TEAM PROGRESS NOTE   INTERVAL HISTORY Patient was evaluated at bedside, daughter and son are present.  Patient neurologically stable.  Slurred speech and left facial droop improved  .  Vital signs stable. Carotid ultrasound shows 40 to 59% left ICA and 1-39% right ICA stenosis. Vitals:   04/16/22 2326 04/17/22 0332 04/17/22 0750 04/17/22 1335  BP: (!) 144/71 (!) 173/83 (!) 165/82 (!) 160/74  Pulse: 72 73 71 77  Resp: '18 18 14 20  '$ Temp: 97.9 F (36.6 C)  98.2 F (36.8 C) 98 F (36.7 C)  TempSrc: Oral  Oral Oral  SpO2: 99% 90% 99% 98%  Weight:      Height:       CBC:  Recent Labs  Lab 04/15/22 1534 04/15/22 1548 04/17/22 0439  WBC 7.6  --  6.3  NEUTROABS 4.3  --   --   HGB 8.8* 9.2* 8.5*  HCT 29.4* 27.0* 27.5*  MCV 87.2  --  84.1  PLT 345  --  253   Basic Metabolic Panel:  Recent Labs  Lab 04/15/22 1534 04/15/22 1548 04/17/22 0439  NA 143 141 145  K 4.3 4.3 3.9  CL 109  --  106  CO2 26  --  27  GLUCOSE 109*  --  97  BUN 51*  --  54*  CREATININE 5.18*  --  5.09*  CALCIUM 8.5*  --  8.7*  PHOS  --   --  6.6*   Lipid Panel:  No results for input(s): "CHOL", "TRIG", "HDL", "CHOLHDL", "VLDL", "LDLCALC" in the last 168 hours.  HgbA1c:  No results for input(s): "HGBA1C" in the last 168 hours.  Urine Drug Screen:  Recent Labs  Lab 04/16/22 0456  LABOPIA NONE DETECTED  COCAINSCRNUR NONE DETECTED  LABBENZ NONE DETECTED  AMPHETMU NONE DETECTED  THCU NONE DETECTED  LABBARB NONE DETECTED    Alcohol Level No results for input(s): "ETH" in the last 168 hours.  IMAGING past 24 hours VAS US CAROTID  Result Date: 04/17/2022 Carotid Arterial Duplex Study Patient Name:  Gina Bailey  Date of Exam:   04/17/2022 Medical Rec #: [id number]        Accession #:    [id number] Date of Birth: 1946-05-19 (76 years)         Patient Gender: F Patient Age:   76 years Exam Location:  [facility] Procedure:      VAS US CAROTID Referring Phys: Janine Ores  --------------------------------------------------------------------------------  Indications:       CVA. Risk Factors:      Hypertension, hyperlipidemia. Comparison Study:  No prior studies. Performing Technologist: Oliver Hum RVT  Examination Guidelines: A complete evaluation includes B-mode imaging, spectral Doppler, color Doppler, and power Doppler as needed of all accessible portions of each vessel. Bilateral testing is considered an integral part of a complete examination. Limited examinations for reoccurring indications may be performed as noted.  Right Carotid Findings: +----------+--------+--------+--------+-----------------------+--------+           PSV cm/sEDV cm/sStenosisPlaque Description     Comments +----------+--------+--------+--------+-----------------------+--------+ CCA Prox  73      10              smooth and heterogenous         +----------+--------+--------+--------+-----------------------+--------+ CCA Distal72      14              smooth and heterogenous         +----------+--------+--------+--------+-----------------------+--------+ ICA Prox  49  9               smooth and heterogenoustortuous +----------+--------+--------+--------+-----------------------+--------+ ICA Distal79      15                                     tortuous +----------+--------+--------+--------+-----------------------+--------+ ECA       69      7                                               +----------+--------+--------+--------+-----------------------+--------+ +----------+--------+-------+--------+-------------------+           PSV cm/sEDV cmsDescribeArm Pressure (mmHG) +----------+--------+-------+--------+-------------------+ [id number]                                         +----------+--------+-------+--------+-------------------+ +---------+--------+--+--------+-+---------+ VertebralPSV cm/s57EDV cm/s9Antegrade  +---------+--------+--+--------+-+---------+  Left Carotid Findings: +----------+--------+--------+--------+-----------------------+--------+           PSV cm/sEDV cm/sStenosisPlaque Description     Comments +----------+--------+--------+--------+-----------------------+--------+ CCA Prox  61      10              smooth and heterogenous         +----------+--------+--------+--------+-----------------------+--------+ CCA Distal60      9               smooth and heterogenous         +----------+--------+--------+--------+-----------------------+--------+ ICA Prox  364     42      40-59%  smooth and heterogenoustortuous +----------+--------+--------+--------+-----------------------+--------+ ICA Mid   49      13                                     tortuous +----------+--------+--------+--------+-----------------------+--------+ ICA Distal53      15                                     tortuous +----------+--------+--------+--------+-----------------------+--------+ ECA       267     9                                               +----------+--------+--------+--------+-----------------------+--------+ +----------+--------+--------+--------+-------------------+           PSV cm/sEDV cm/sDescribeArm Pressure (mmHG) +----------+--------+--------+--------+-------------------+ Subclavian300                                         +----------+--------+--------+--------+-------------------+ +---------+--------+--+--------+-+---------+ VertebralPSV cm/s49EDV cm/s9Antegrade +---------+--------+--+--------+-+---------+   Summary: Right Carotid: Velocities in the right ICA are consistent with a 1-39% stenosis. Left Carotid: Velocities in the left ICA are consistent with a 40-59% stenosis. Vertebrals: Bilateral vertebral arteries demonstrate antegrade flow. *See table(s) above for measurements and observations.  Electronically signed by Antony Contras MD on 04/17/2022  at 2:26:29 PM.    Final     --PHYSICAL EXAM-- Constitutional: Elderly pleasant African-American lady, no acute distress  HEENT:  Normocephalic, Normal conjunctiva, anicteric. Hearing grossly intact. No nasal discharge. Neck: Neck is supple. No masses or thyromegaly. Resp: Respirations are non-labored. No wheezing. Skin: Warm. No rashes or ulcers.  Neuro Mental Status Awake and interactive oriented to self and time, but not to place. Is not able to recall what brought her to ED.  Dysarthric, with some intermittent paucity of speech.  Cognitive/Recall: 0/3 words 4/14 animals  Cranial Nerves II: Nor abnormalities in visual fields. III, IV, VI: EOM intact, no gaze preference or deviation, no nystagmus  V: Normal sensation in V1, V2, and V3 segments bilaterally  VII: L facial droop VIII: Normal hearing to speech  IX, X: Normal palatal elevation, no uvular deviation  XI: 5/5 head turn and 5/5 shoulder shrug bilaterally XII: Symmetric tongue movement, tongue is midline without atrophy or fasciculations.   Motor Normal bulk and tone Strength R UE: 5/5 L UE: 5/5 R LE: 5/5 L LE: 5/5  Sensory  Intact sensation of UE and LE bilaterally.   Cerebellar: FNF, Heel-to-Shin Test, Rapid Alternating Movements: poor coordination not out of proportion to cognitive deficits  ASSESSMENT/PLAN Gina Bailey is a 76 y.o. female with history of CKD, DM, HLD, HTN brought by family to ED for concern of new onset left sided facial weakness and slurred speech. Was found to have L facial droop, dysarthic, and with L upper extremity weakness. MRA brain and cervical significant for acute infarct in R posterior limb of the IC and inferior posterior R lentiform nucleus.  Acute infarct in the occipital lobe periventricular white matter posterior to the L occipital horn. MR brain with limited diffusion in the areas corresponding with infarct.   Stroke: Acute Lacunar Infarcts at the R lentiform nucleus and  L posterior occipital horn Etiology:  Likely 2/2 to chronic small vessel disease CT Head w/o contrast: No acute findings, only chronic small vessel ischemic changes in white matter     MRI  - There is an acute infarct in the right posterior limb of the internal capsule and posteroinferior right lentiform nucleus. Additional acute infarct in the occipital lobe periventricular white matter posterior to the left occipital horn. Evaluation of the intracranial vasculature is significantly motion limited.  MRA: findings as above, with no intracranial large vessel occlusion or significant stenosis. Carotid Doppler : 1-39% right ICA and 40-59% left ICA stenosis 2D Echo LVEF 60-65% LDL 135 HgbA1c 5.4 VTE prophylaxis - Heparin subQ    Diet   Diet Heart Room service appropriate? Yes; Fluid consistency: Thin   No antithrombotic prior to admission, now on aspirin 81 mg daily and clopidogrel 75 mg daily for 3 weeks and then ASA '81mg'$  alone Therapy recommendations:  pending Disposition:  pending  Hypertension Home meds:  amlodipine, carvedilol, furosemide Stable Permissive hypertension (OK if < 220/120) but gradually normalize in 5-7 days Long-term BP goal normotensive  Hyperlipidemia Home meds:  none LDL 135, goal < 70 Add Crestor 20 mg Continue statin at discharge  Diabetes type II Controlled Home meds:  none HgbA1c 5.4, goal < 7.0 CBGs SSI  Other Stroke Risk Factors Advanced Age >/= 90  Obesity, Body mass index is 29.06 kg/m., BMI >/= 30 associated with increased stroke risk, recommend weight loss, diet and exercise as appropriate  CKD4 Smoking history - encourage smoking cessation  Other Active Problems CKD - Primary team following, concur with management   Hospital day # 1  Patient noted with dysarthria and facial droop due to right internal capsule and left  periventricular white matter lacunar infarcts likely from small vessel disease.  Recommend aspirin and Plavix for 3 days  followed by aspirin alone and aggressive risk factor modification.  Physical occupational and speech therapy consults.  Long discussion with patient and family at the bedside and answered questions.  Greater than 50% time during this 27mnute visit was spent in counseling and coordination of care and discussion with patient and family and answering questions.  Follow-up as outpatient stroke clinic in 2 months.  Stroke team will sign off.  Kindly call for questions. PAntony Contras MD Medical Director [facility] Pager: [phone]/29/2023 3:32 PM  To contact Stroke Continuity provider, please refer to [email or web address] After hours, contact General Neurology

## 2022-04-17 NOTE — Progress Notes (Signed)
Carotid artery duplex has been completed. Preliminary results can be found in CV Proc through chart review.   04/17/22 2:15 PM Gina Bailey RVT

## 2022-04-17 NOTE — Progress Notes (Addendum)
Speech Language Pathology Treatment: Cognitive-Linquistic (dysarthria)  Patient Details Name: Gina Bailey MRN: [id number] DOB: 1946-07-15 Today's Date: 04/17/2022 Time: 1040-1055 SLP Time Calculation (min) (ACUTE ONLY): 15 min  Assessment / Plan / Recommendation Clinical Impression  Pt was seen for dysarthria treatment with her family present. Pt was cooperative during the session and exhibited increased awareness of her motor speech impairments by the end of the session. Pt and her family were educated regarding the nature of dysarthria, and compensatory strategies to improve speech intelligibility. All parties verbalized understanding regarding all areas of education. She used compensatory strategies at the word level with 80% accuracy increasing to 100% with models. At the phrase level she demonstrated 70% accuracy increasing to 100% with prompts for rate and overarticulation. Some generalization was noted during conversation with occasional self-correction for articulation. SLP will continue to follow pt.    Gina Bailey Gina Bailey: Pt is a 76 y/o female who presented with left-sided facial droop and dysarthria. Per neurology's note, pt's family states that the pt's mentation has been in decline for some time and that that patient has been non-adherent to home medications due to poor memory. MRI brain: acute infarct in the right posterior limb of the internal capsule and posteroinferior right lentiform nucleus. Additional acute infarct in the occipital lobe periventricular white matter posterior to the left occipital horn. PMH: T2DM with neuropathy, poorly controlled HTN, ESRD with AVF LUE, HLD, former smoker, MR, diastolic HF.      SLP Plan  Continue with current plan of care  Patient needs continued Speech Lanaguage Pathology Services   Recommendations for follow up therapy are one component of a multi-disciplinary discharge planning process, led by the attending physician.  Recommendations may be updated  based on patient status, additional functional criteria and insurance authorization.    Recommendations                   Follow Up Recommendations:  (Continued SLP services at level of care recommended by PT/OT) Assistance recommended at discharge: Frequent or constant Supervision/Assistance SLP Visit Diagnosis: Dysarthria and anarthria (R47.1) Plan: Continue with current plan of care          Gina Bailey, [place], [place] Office number [phone]  Horton Marshall  04/17/2022, 11:46 AM

## 2022-04-18 LAB — RENAL FUNCTION PANEL
Albumin: 3 g/dL — ABNORMAL LOW (ref 3.5–5.0)
Anion gap: 10 (ref 5–15)
BUN: 51 mg/dL — ABNORMAL HIGH (ref 8–23)
CO2: 25 mmol/L (ref 22–32)
Calcium: 8.3 mg/dL — ABNORMAL LOW (ref 8.9–10.3)
Chloride: 107 mmol/L (ref 98–111)
Creatinine, Ser: 4.94 mg/dL — ABNORMAL HIGH (ref 0.44–1.00)
GFR, Estimated: 8 mL/min — ABNORMAL LOW (ref >60)
Glucose, Bld: 81 mg/dL (ref 70–99)
Phosphorus: 5.9 mg/dL — ABNORMAL HIGH (ref 2.5–4.6)
Potassium: 3.7 mmol/L (ref 3.5–5.1)
Sodium: 142 mmol/L (ref 135–145)

## 2022-04-18 LAB — CBC
HCT: 27.8 % — ABNORMAL LOW (ref 36.0–46.0)
Hemoglobin: 8.7 g/dL — ABNORMAL LOW (ref 12.0–15.0)
MCH: 26.4 pg (ref 26.0–34.0)
MCHC: 31.3 g/dL (ref 30.0–36.0)
MCV: 84.2 fL (ref 80.0–100.0)
Platelets: 330 10*3/uL (ref 150–400)
RBC: 3.3 MIL/uL — ABNORMAL LOW (ref 3.87–5.11)
RDW: 15.9 % — ABNORMAL HIGH (ref 11.5–15.5)
WBC: 7.4 10*3/uL (ref 4.0–10.5)
nRBC: 0 % (ref 0.0–0.2)

## 2022-04-18 NOTE — Plan of Care (Signed)
  Problem: Activity: Goal: Risk for activity intolerance will decrease Outcome: Progressing   Problem: Coping: Goal: Level of anxiety will decrease Outcome: Progressing   Problem: Pain Managment: Goal: General experience of comfort will improve Outcome: Progressing   Problem: Safety: Goal: Ability to remain free from injury will improve Outcome: Progressing   Problem: Skin Integrity: Goal: Risk for impaired skin integrity will decrease Outcome: Progressing   

## 2022-04-18 NOTE — Discharge Instructions (Addendum)
Dear Gina Bailey, it has been a pleasure caring for you and I am so happy to see you doing well! You were hospitalized for stroke. Since then you have recovered, you are no longer . We also continued a lot of your home medications to manage your . In addition to medications, you were also seen by .   When you are discharged we would like you to do the following:  1. You were started on the following medications in the hospital. Please continue them using these following instructions:  2.  Some of your medication regimen has been changed.  Please see the following instructions for the changes:  3. Continue taking the rest of the medications as you were before you came to the hospital.  4. Follow-up with your

## 2022-04-18 NOTE — Progress Notes (Signed)
Physical Therapy Treatment Patient Details Name: Gina Bailey MRN: [id number] DOB: 05-27-1946 Today's Date: 04/18/2022   History of Present Illness Pt is a 76 yo F who presented 9/27 with L facial weakness and slurred speech. PMH significant for HTN, DM, HLD, CKD. MRI confirms Acute Lacunar Infarcts at the R lentiform nucleus and L posterior occipital horn    PT Comments    Pt received in chair, agreeable to therapy session with good participation and tolerance for transfer and gait training, per RN she had been up in chair >6 hours at beginning of session. Pt needing up to minA/modA for unsupported standing balance and minA for gait with RW and mod cues for safety, activity pacing and frequent assist for RW mgmt. Pt with increasing L lean and LUE fatigue as distance progressed. Pt reports 5/10 modified RPE (fatigue) at end of session, pt would benefit from higher intensity post-acute rehab and likely to tolerate 3 hours of therapy a day well. Pt continues to benefit from PT services to progress toward functional mobility goals.    Recommendations for follow up therapy are one component of a multi-disciplinary discharge planning process, led by the attending physician.  Recommendations may be updated based on patient status, additional functional criteria and insurance authorization.  Follow Up Recommendations  Acute inpatient rehab (3hours/day)     Assistance Recommended at Discharge Frequent or constant Supervision/Assistance  Patient can return home with the following A little help with walking and/or transfers;A little help with bathing/dressing/bathroom;Assistance with cooking/housework;Help with stairs or ramp for entrance;Direct supervision/assist for medications management;Assist for transportation   Equipment Recommendations  Rolling walker (2 wheels)    Recommendations for Other Services Rehab consult     Precautions / Restrictions Precautions Precautions:  Fall Restrictions Weight Bearing Restrictions: No     Mobility  Bed Mobility Overal bed mobility: Needs Assistance Bed Mobility: Sit to Sidelying, Rolling Rolling: Min assist       Sit to sidelying: Mod assist General bed mobility comments: increased time, heavy modA for BLE assist to reach sidelying. increased effort to roll toward her back.    Transfers Overall transfer level: Needs assistance Equipment used: 1 person hand held assist, Rolling walker (2 wheels) Transfers: Sit to/from Stand Sit to Stand: Min assist           General transfer comment: from chair and to/from low toliet seat heights, then EOB; decreased eccentric control to sit and needs consistent cues for safer UE placement    Ambulation/Gait Ambulation/Gait assistance: Min assist Gait Distance (Feet): 80 Feet (24f to toilet, then 89f Assistive device: Rolling walker (2 wheels) Gait Pattern/deviations: Step-to pattern, Decreased step length - left, Drifts right/left, Trunk flexed, Narrow base of support, Decreased dorsiflexion - left Gait velocity: grossly <0.3 m/s     General Gait Details: leaning L and reports L side heavy, turns both ways in hallway with increased time, cues for technique, assist for balance with L lateral lean; pt veering toward L side of hallway. flexed posture, able to correct briefly with cues; frequent assist needed for RW mgmt     Modified Rankin (Stroke Patients Only) Modified Rankin (Stroke Patients Only) Pre-Morbid Rankin Score: Moderate disability Modified Rankin: Moderately severe disability     Balance Overall balance assessment: Needs assistance, History of Falls Sitting-balance support: Feet supported Sitting balance-Leahy Scale: Good Sitting balance - Comments: leaning on toilet for hygiene after BM   Standing balance support: During functional activity, Reliant on assistive device for balance Standing balance-Leahy  Scale: Poor Standing balance comment: UE  support leaning on elbows for balance washing hands, stood without support with wide BOS and CGA to minA with posterior LOB when letting go of RW to throw away hand towel needing min/modA to correct                            Cognition Arousal/Alertness: Awake/alert Behavior During Therapy: WFL for tasks assessed/performed Overall Cognitive Status: Impaired/Different from baseline Area of Impairment: Following commands, Awareness, Safety/judgement, Memory, Problem solving                     Memory: Decreased short-term memory Following Commands: Follows multi-step commands with increased time Safety/Judgement: Decreased awareness of safety, Decreased awareness of deficits   Problem Solving: Slow processing, Decreased initiation, Requires verbal cues General Comments: DIL reports pt has slower processing since CVA; pt frequently lifting BUE off RW handles with resulting LOB needs reminders to maintain grasp, especially on LUE        Exercises Other Exercises Other Exercises: seated BLE AROM: ankle pumps, LAQ, hip flexion x10 reps ea    General Comments General comments (skin integrity, edema, etc.): SpO2 and HR WFL on RA      Pertinent Vitals/Pain Pain Assessment Pain Assessment: No/denies pain           PT Goals (current goals can now be found in the care plan section) Acute Rehab PT Goals Patient Stated Goal: to be as independent as she can be PT Goal Formulation: With patient/family Time For Goal Achievement: 04/30/22 Progress towards PT goals: Progressing toward goals    Frequency    Min 4X/week      PT Plan Current plan remains appropriate       AM-PAC PT "6 Clicks" Mobility   Outcome Measure  Help needed turning from your back to your side while in a flat bed without using bedrails?: A Little Help needed moving from lying on your back to sitting on the side of a flat bed without using bedrails?: A Little Help needed moving to and from  a bed to a chair (including a wheelchair)?: A Little Help needed standing up from a chair using your arms (e.g., wheelchair or bedside chair)?: A Little Help needed to walk in hospital room?: A Lot (mod cues and assist to manage RW) Help needed climbing 3-5 steps with a railing? : Total 6 Click Score: 15    End of Session Equipment Utilized During Treatment: Gait belt Activity Tolerance: Patient tolerated treatment well Patient left: in bed;with call bell/phone within reach;with family/visitor present;with bed alarm set (grandson present in room) Nurse Communication: Mobility status PT Visit Diagnosis: Other abnormalities of gait and mobility (R26.89);Other symptoms and signs involving the nervous system (R29.898);History of falling (Z91.81);Hemiplegia and hemiparesis Hemiplegia - Right/Left: Left Hemiplegia - dominant/non-dominant: Non-dominant Hemiplegia - caused by: Cerebral infarction     Time: 6160-7371 PT Time Calculation (min) (ACUTE ONLY): 35 min  Charges:  $Gait Training: 8-22 mins $Therapeutic Activity: 8-22 mins                     Hersey Maclellan P., PTA Acute Rehabilitation Services Secure Chat Preferred 9a-5:30pm Office: [place] 04/18/2022, 3:14 PM

## 2022-04-18 NOTE — PMR Pre-admission (Signed)
PMR Admission Coordinator Pre-Admission Assessment  Patient: Gina Bailey is an 76 y.o., female MRN: [id number] DOB: January 10, 1946 Height: 5\' 5"  (165.1 cm) Weight: 79.2 kg  Insurance Information HMO: ***    PPO: ***     PCP: ***     IPA: ***     80/20: ***     OTHER: *** PRIMARY: [employer]      Policy#: [id number]      Subscriber: patient CM Name: ***      Phone#: ***     Fax#: *** Pre-Cert#: ***      Employer: *** Benefits:  Phone #: [phone]     Name: *** Eff. Date: ***     Deduct: ***      Out of Pocket Max: ***      Life Max: *** CIR: ***      SNF: *** Outpatient: ***     Co-Pay: *** Home Health: ***      Co-Pay: *** DME: ***     Co-Pay: *** Providers: in network  SECONDARY:       Policy#:      Phone#:   Artist:       Phone#:   The Data processing manager" for patients in Inpatient Rehabilitation Facilities with attached "Privacy Act Statement-Health Care Records" was provided and verbally reviewed with: Patient and Family  Emergency Contact Information Contact Information     Name Relation Home Work Mobile   [place] Son   [phone]   Payton Doughty Daughter [phone]       Current Medical History  Patient Admitting Diagnosis: L CVA  History of Present Illness: A 76 y.o. female who presents with new stroke.  When asked what made her think she was having a stroke, she indicates that her children told her that she may be having one.  She had not noticed anything in particular.  Her family notes that she had significant facial weakness and slurred speech and therefore brought her in to be evaluated.  In the emergency department an MRI was obtained which demonstrated an acute infarct in the right posterior limb of the internal capsule and posteroinferior right lentiform nucleus.  An additional acute infarct in the occipital lobe periventricular white matter posterior to the left occipital horn was also noted.   PT/OT/SLP evaluations were completed with recommendations for acute inpatient rehab admission.  Complete NIHSS TOTAL: 2  Patient's medical record from [facility] has been reviewed by the rehabilitation admission coordinator and physician.  Past Medical History  Past Medical History:  Diagnosis Date   Chronic kidney disease    Diabetes mellitus    Diet-controlled. Not on antiglycemic medications.   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  Based on the size, patient's age, status as a former smoker, and its discovery in a community setting, her probability of malignancy is 3% (low probability).  Based on this, I recommend follow-up with a CT scan around 05/2014, and if lesion is stable or smaller, no further follow-up is needed.  CT Chest 06/2015: Stable 3mm pulmonary nodule. No   Ventral hernia    Incisional ventral hernia. S/P repair by Dr. 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 Lindie Spruce. (08/2006)    Has the patient had major surgery during 100 days prior to admission? Yes  Family History   family history includes Breast cancer in her mother; Diabetes in an other family member.  Current Medications  Current Facility-Administered Medications:    acetaminophen (TYLENOL) tablet 650 mg, 650 mg, Oral, Q4H PRN **OR** acetaminophen (TYLENOL) 160 MG/5ML solution 650 mg, 650 mg, Per Tube, Q4H PRN **OR** acetaminophen (TYLENOL) suppository 650 mg, 650 mg, Rectal, Q4H PRN, Kirke Corin, Flossie Buffy, MD   amLODipine (NORVASC) tablet 10 mg, 10 mg, Oral, Daily, Banci, Daniela, MD, 10 mg at 04/18/22 1008   aspirin EC  tablet 81 mg, 81 mg, Oral, Daily, Rejeana Brock, MD, 81 mg at 04/18/22 1008   calcitRIOL (ROCALTROL) capsule 0.25 mcg, 0.25 mcg, Oral, Once per day on Mon Wed Fri, Amponsah, Prosper M, MD, 0.25 mcg at 04/17/22 0939   carbamide peroxide (DEBROX) 6.5 % OTIC (EAR) solution 5 drop, 5 drop, Both EARS, BID, Doran Stabler, DO, 5 drop at 04/18/22 1009   carvedilol (COREG) tablet 6.25 mg, 6.25 mg, Oral, BID WC, Banci, Daniela, MD, 6.25 mg at 04/18/22 0900   [COMPLETED] clopidogrel (PLAVIX) tablet 300 mg, 300 mg, Oral, Once, 300 mg at 04/16/22 0547 **AND** clopidogrel (PLAVIX) tablet 75 mg, 75 mg, Oral, Daily, Banci, Daniela, MD, 75 mg at 04/18/22 1008   ferric gluconate (FERRLECIT) 250 mg in sodium chloride 0.9 % 250 mL IVPB, 250 mg, Intravenous, Daily, Banci, Fausto Skillern, MD, Last Rate: 135 mL/hr at 04/18/22 1019, 250 mg at 04/18/22 1019   furosemide (LASIX) tablet 80 mg, 80 mg, Oral, Daily, Steffanie Rainwater, MD, 80 mg at 04/18/22 1008   heparin injection 5,000 Units, 5,000 Units, Subcutaneous, Q8H, Steffanie Rainwater, MD, 5,000 Units at 04/18/22 0539   rosuvastatin (CRESTOR) tablet 20 mg, 20 mg, Oral, Daily, Shafer, Devon, NP, 20 mg at 04/18/22 1008   senna-docusate (Senokot-S) tablet 1 tablet, 1 tablet, Oral, QHS PRN, Steffanie Rainwater, MD  Patients Current Diet:  Diet Order             Diet Heart Room service appropriate? Yes; Fluid consistency: Thin  Diet effective now                   Precautions / Restrictions Precautions Precautions: Fall Restrictions Weight Bearing Restrictions: No   Has the patient had 2 or more falls or a fall with injury in the past year? Yes  Prior Activity Level Limited Community (1-2x/wk): Went out 2-3 times a week.  Not driving.  Prior Functional Level Self Care: Did the patient need help bathing, dressing, using the toilet or eating? Independent  Indoor Mobility: Did the patient need assistance with walking from room to room (with or without  device)? Independent  Stairs: Did the patient need assistance with internal or external stairs (with or without device)? Independent  Functional Cognition: Did the patient need help planning regular tasks such as shopping or remembering to take medications? Needed some help  Patient Information Are you of Hispanic, Latino/a,or Spanish origin?: A. No, not of Hispanic, Latino/a, or Spanish origin What is your race?: B. Black or African American Do you need or want an interpreter to communicate with a doctor or health care staff?: 0. No  Patient's Response To:  Health Literacy and Transportation Is the  patient able to respond to health literacy and transportation needs?: Yes Health Literacy - How often do you need to have someone help you when you read instructions, pamphlets, or other written material from your doctor or pharmacy?: Rarely In the past 12 months, has lack of transportation kept you from medical appointments or from getting medications?: No In the past 12 months, has lack of transportation kept you from meetings, work, or from getting things needed for daily living?: No  Home Assistive Devices / Equipment Home Equipment: None  Prior Device Use: Indicate devices/aids used by the patient prior to current illness, exacerbation or injury? None of the above  Current Functional Level Cognition  Arousal/Alertness: Awake/alert Overall Cognitive Status: Impaired/Different from baseline Orientation Level: Oriented to person, Oriented to place Following Commands: Follows multi-step commands with increased time Safety/Judgement: Decreased awareness of safety, Decreased awareness of deficits General Comments: DIL reports pt has slower processing since CVA Attention: Focused, Sustained, Selective Focused Attention: Appears intact Sustained Attention: Appears intact Selective Attention: Impaired Selective Attention Impairment: Verbal complex Memory: Impaired Memory Impairment:  Decreased short term memory, Decreased recall of new information (Immediate: 5/5; delayed: 0/5; with cues: 3/5) Awareness: Impaired Awareness Impairment: Intellectual impairment Problem Solving: Impaired Problem Solving Impairment: Verbal complex (money: 0/3; time: 0/1) Executive Function: Sequencing, Occupational hygienist: Impaired Sequencing Impairment: Verbal complex (clock: 2/4; verbal sequencing: multiple steps missing) Organizing: Impaired Organizing Impairment: Verbal complex (backward digit span: 1/2)    Extremity Assessment (includes Sensation/Coordination)  Upper Extremity Assessment: Defer to OT evaluation LUE Deficits / Details: Slightly impaired overhead AROM. Strength similar to RUE. Slight coordination deficits LUE Sensation: WNL LUE Coordination: decreased fine motor, decreased gross motor  Lower Extremity Assessment: LLE deficits/detail RLE Deficits / Details: WFL LLE Deficits / Details: AROM WFL, strength 4/5 throughout    ADLs  Overall ADL's : Needs assistance/impaired Eating/Feeding: Supervision/ safety Grooming: Supervision/safety Upper Body Bathing: Minimal assistance Lower Body Bathing: Moderate assistance, Sit to/from stand Upper Body Dressing : Minimal assistance, Sitting Lower Body Dressing: Moderate assistance, Sit to/from stand Toilet Transfer: Moderate assistance, Ambulation Toileting- Clothing Manipulation and Hygiene: Moderate assistance, Sit to/from stand Functional mobility during ADLs: Moderate assistance, Cueing for safety, Cueing for sequencing General ADL Comments: Mod A for LB ADLS, (S)-min A for UB, cueing for technique    Mobility  Overal bed mobility: Needs Assistance Bed Mobility: Supine to Sit Supine to sit: Min assist General bed mobility comments: increased time, heavy use of rail. assist to scoot hips to EOB    Transfers  Overall transfer level: Needs assistance Equipment used: 1 person hand held assist, Rolling walker (2  wheels) Transfers: Sit to/from Stand Sit to Stand: Min assist Bed to/from chair/wheelchair/BSC transfer type:: Stand pivot Stand pivot transfers: Mod assist General transfer comment: stood with A for balance, pt reaching for UE support    Ambulation / Gait / Stairs / Wheelchair Mobility  Ambulation/Gait Ambulation/Gait assistance: Editor, commissioning (Feet): 90 Feet (&15') Assistive device: Rolling walker (2 wheels) Gait Pattern/deviations: Step-to pattern, Decreased step length - left, Drifts right/left, Trunk flexed General Gait Details: leaning L and reports L side heavy, turns both ways in hallway with increased time, cues for technique, assist for balance with L lateral lean    Posture / Balance Dynamic Sitting Balance Sitting balance - Comments: leaning on toilet for hygiene after BM Balance Overall balance assessment: Needs assistance, History of Falls Sitting-balance support: Feet supported Sitting balance-Leahy Scale: Good Sitting balance - Comments: leaning on toilet for hygiene  after BM Standing balance support: During functional activity, Reliant on assistive device for balance Standing balance-Leahy Scale: Poor Standing balance comment: UE support leaning on elbows for balance washing hands, stood without support with wide BOS and CGA    Special needs/care consideration Skin Has a graft in left upper arm to dialysis but not currently on dialysis, Diabetic management yes h/o DM, and Special service needs ***   Previous Home Environment (from acute therapy documentation) Living Arrangements: Children  Lives With: Son Available Help at Discharge: Family Type of Home: House Home Layout: One level Home Access: Stairs to enter [employer] of Steps: 1 step up onto porch Bathroom Shower/Tub: Engineer, manufacturing systems: Standard Additional Comments: DIL was present initially and reported family could likely make 24/7 supervision work  Discharge Living  Setting Plans for Discharge Living Setting: Patient's home, House, Lives with (comment) (Is a widow.  Lives with her son.) Type of Home at Discharge: House Discharge Home Layout: One level Discharge Home Access: Stairs to enter Entrance Stairs-Rails: None Entrance Stairs-Number of Steps: 1 step up onto porch Discharge Bathroom Shower/Tub: Tub/shower unit, Curtain Discharge Bathroom Toilet: Standard Discharge Bathroom Accessibility: Yes How Accessible: Accessible via walker  Social/Family/Support Systems Patient Roles: Parent, Other (Comment) (Has a son, daughter, dtr-in-law) Contact Information: Tzipporah Jurick - son - [phone] Anticipated Caregiver: Son, Dtr and dtr-in-law Ability/Limitations of Caregiver: Son works on weekends and dtr-in-law works at [employer] as an Charity fundraiser during the week. Caregiver Availability: 24/7 (Likely family can work out 24/7 supervision.) Discharge Plan Discussed with Primary Caregiver: Yes Is Caregiver In Agreement with Plan?: Yes Does Caregiver/Family have Issues with Lodging/Transportation while Pt is in Rehab?: No  Goals Patient/Family Goal for Rehab: PT/OT/SLP supervision goals Expected length of stay: 7-10 days Pt/Family Agrees to Admission and willing to participate: Yes Program Orientation Provided & Reviewed with Pt/Caregiver Including Roles  & Responsibilities: Yes  Decrease burden of Care through IP rehab admission: N/A  Possible need for SNF placement upon discharge: Not anticipated  Patient Condition: I have reviewed medical records from [facility] health, spoken with CM, and patient and family member. I met with patient at the bedside for inpatient rehabilitation assessment.  Patient will benefit from ongoing PT, OT, and SLP, can actively participate in 3 hours of therapy a day 5 days of the week, and can make measurable gains during the admission.  Patient will also benefit from the coordinated team approach during an Inpatient Acute Rehabilitation  admission.  The patient will receive intensive therapy as well as Rehabilitation physician, nursing, social worker, and care management interventions.  Due to bladder management, bowel management, safety, skin/wound care, disease management, medication administration, pain management, and patient education the patient requires 24 hour a day rehabilitation nursing.  The patient is currently *** with mobility and basic ADLs.  Discharge setting and therapy post discharge at home with home health is anticipated.  Patient has agreed to participate in the Acute Inpatient Rehabilitation Program and will admit {Time; today/tomorrow:10263}.  Preadmission Screen Completed By:  Trish Mage, 04/18/2022 12:29 PM ______________________________________________________________________   Discussed status with Dr. Marland Kitchen on *** at *** and received approval for admission today.  Admission Coordinator:  Trish Mage, RN, time Marland KitchenDorna Bloom ***   Assessment/Plan: Diagnosis: Does the need for close, 24 hr/day Medical supervision in concert with the patient's rehab needs make it unreasonable for this patient to be served in a less intensive setting? {yes_no_potentially:3041433} Co-Morbidities requiring supervision/potential complications: *** Due to {due WG:9562130}, does  the patient require 24 hr/day rehab nursing? {yes_no_potentially:3041433} Does the patient require coordinated care of a physician, rehab nurse, PT, OT, and SLP to address physical and functional deficits in the context of the above medical diagnosis(es)? {yes_no_potentially:3041433} Addressing deficits in the following areas: {deficits:3041436} Can the patient actively participate in an intensive therapy program of at least 3 hrs of therapy 5 days a week? {yes_no_potentially:3041433} The potential for patient to make measurable gains while on inpatient rehab is {potential:3041437} Anticipated functional outcomes upon discharge from inpatient rehab:  {functional outcomes:304600100} PT, {functional outcomes:304600100} OT, {functional outcomes:304600100} SLP Estimated rehab length of stay to reach the above functional goals is: *** Anticipated discharge destination: {anticipated dc setting:21604} 10. Overall Rehab/Functional Prognosis: {potential:3041437}   MD Signature: ***

## 2022-04-18 NOTE — Progress Notes (Signed)
HD#2 Subjective:  Overnight Events: No event  Patient seen at bedside with her daughter-in-law.  She denies any issue.  Reports good p.o. intake, BM and urination.  Denies any change in neurological function since yesterday.  Objective:  Vital signs in last 24 hours: Vitals:   04/17/22 1900 04/17/22 2156 04/17/22 2358 04/18/22 0425  BP: (!) 156/72 (!) 143/63 131/69 (!) 149/67  Pulse: 90 72 67 65  Resp:  '18 20 20  '$ Temp:  98.9 F (37.2 C) 98.2 F (36.8 C) 98.4 F (36.9 C)  TempSrc:  Axillary Axillary Oral  SpO2:  98% 100% 96%  Weight:      Height:       Supplemental O2: Room Air SpO2: 96 %   Physical Exam:  Physical Exam Constitutional:      General: She is not in acute distress. HENT:     Head: Normocephalic.  Eyes:     General:        Right eye: No discharge.        Left eye: No discharge.     Conjunctiva/sclera: Conjunctivae normal.  Cardiovascular:     Rate and Rhythm: Normal rate and regular rhythm.  Pulmonary:     Effort: Pulmonary effort is normal. No respiratory distress.  Abdominal:     Palpations: Abdomen is soft.  Skin:    General: Skin is warm.     Coloration: Skin is not jaundiced.  Neurological:     Mental Status: She is alert.     Comments: Left-sided facial drooping noted. Other cranial nerves intact 4/5 strength of left upper and lower extremity 5/5 strength of right upper and lower extremity Sensation intact  Psychiatric:        Mood and Affect: Mood normal.     Filed Weights   04/15/22 1512  Weight: 79.2 kg     Intake/Output Summary (Last 24 hours) at 04/18/2022 0641 Last data filed at 04/17/2022 1900 Gross per 24 hour  Intake --  Output 1500 ml  Net -1500 ml   Net IO Since Admission: -1,810 mL [04/18/22 0641]  Pertinent Labs:    Latest Ref Rng & Units 04/17/2022    4:39 AM 04/15/2022    3:48 PM 04/15/2022    3:34 PM  CBC  WBC 4.0 - 10.5 K/uL 6.3   7.6   Hemoglobin 12.0 - 15.0 g/dL 8.5  9.2  8.8   Hematocrit 36.0 -  46.0 % 27.5  27.0  29.4   Platelets 150 - 400 K/uL 343   345        Latest Ref Rng & Units 04/17/2022    4:39 AM 04/15/2022    3:48 PM 04/15/2022    3:34 PM  CMP  Glucose 70 - 99 mg/dL 97   109   BUN 8 - 23 mg/dL 54   51   Creatinine 0.44 - 1.00 mg/dL 5.09   5.18   Sodium 135 - 145 mmol/L 145  141  143   Potassium 3.5 - 5.1 mmol/L 3.9  4.3  4.3   Chloride 98 - 111 mmol/L 106   109   CO2 22 - 32 mmol/L 27   26   Calcium 8.9 - 10.3 mg/dL 8.7   8.5   Total Protein 6.5 - 8.1 g/dL   7.1   Total Bilirubin 0.3 - 1.2 mg/dL   0.4   Alkaline Phos 38 - 126 U/L   56   AST 15 - 41 U/L  15   ALT 0 - 44 U/L   11     Imaging: VAS US CAROTID  Result Date: 04/17/2022 Carotid Arterial Duplex Study Patient Name:  LILLIA LENGEL  Date of Exam:   04/17/2022 Medical Rec #: [id number]        Accession #:    [id number] Date of Birth: [birth year]         Patient Gender: F Patient Age:   76 years Exam Location:  [facility] Procedure:      VAS US CAROTID Referring Phys: Janine Ores --------------------------------------------------------------------------------  Indications:       CVA. Risk Factors:      Hypertension, hyperlipidemia. Comparison Study:  No prior studies. Performing Technologist: Oliver Hum RVT  Examination Guidelines: A complete evaluation includes B-mode imaging, spectral Doppler, color Doppler, and power Doppler as needed of all accessible portions of each vessel. Bilateral testing is considered an integral part of a complete examination. Limited examinations for reoccurring indications may be performed as noted.  Right Carotid Findings: +----------+--------+--------+--------+-----------------------+--------+           PSV cm/sEDV cm/sStenosisPlaque Description     Comments +----------+--------+--------+--------+-----------------------+--------+ CCA Prox  73      10              smooth and heterogenous          +----------+--------+--------+--------+-----------------------+--------+ CCA Distal72      14              smooth and heterogenous         +----------+--------+--------+--------+-----------------------+--------+ ICA Prox  49      9               smooth and heterogenoustortuous +----------+--------+--------+--------+-----------------------+--------+ ICA Distal79      15                                     tortuous +----------+--------+--------+--------+-----------------------+--------+ ECA       69      7                                               +----------+--------+--------+--------+-----------------------+--------+ +----------+--------+-------+--------+-------------------+           PSV cm/sEDV cmsDescribeArm Pressure (mmHG) +----------+--------+-------+--------+-------------------+ [id number]                                         +----------+--------+-------+--------+-------------------+ +---------+--------+--+--------+-+---------+ VertebralPSV cm/s57EDV cm/s9Antegrade +---------+--------+--+--------+-+---------+  Left Carotid Findings: +----------+--------+--------+--------+-----------------------+--------+           PSV cm/sEDV cm/sStenosisPlaque Description     Comments +----------+--------+--------+--------+-----------------------+--------+ CCA Prox  61      10              smooth and heterogenous         +----------+--------+--------+--------+-----------------------+--------+ CCA Distal60      9               smooth and heterogenous         +----------+--------+--------+--------+-----------------------+--------+ ICA Prox  364     42      40-59%  smooth and heterogenoustortuous +----------+--------+--------+--------+-----------------------+--------+ ICA Mid   49      13  tortuous +----------+--------+--------+--------+-----------------------+--------+ ICA Distal53      15                                      tortuous +----------+--------+--------+--------+-----------------------+--------+ ECA       267     9                                               +----------+--------+--------+--------+-----------------------+--------+ +----------+--------+--------+--------+-------------------+           PSV cm/sEDV cm/sDescribeArm Pressure (mmHG) +----------+--------+--------+--------+-------------------+ Subclavian300                                         +----------+--------+--------+--------+-------------------+ +---------+--------+--+--------+-+---------+ VertebralPSV cm/s49EDV cm/s9Antegrade +---------+--------+--+--------+-+---------+   Summary: Right Carotid: Velocities in the right ICA are consistent with a 1-39% stenosis. Left Carotid: Velocities in the left ICA are consistent with a 40-59% stenosis. Vertebrals: Bilateral vertebral arteries demonstrate antegrade flow. *See table(s) above for measurements and observations.  Electronically signed by Antony Contras MD on 04/17/2022 at 2:26:29 PM.    Final     Assessment/Plan:   Principal Problem:   Acute ischemic stroke ([place]) Active Problems:   Hyperlipidemia associated with type 2 diabetes mellitus ([place])   Hypertension   Anemia of chronic renal failure   CKD (chronic kidney disease), stage V [facility])   Patient Summary: [name] is a 76yo woman with a history of T2DM with neuropathy, poorly controlled HTN, advanced CKD with new RUE AVF, HLD, former smoker, MR, diastolic HF, mated for acute subcortical CVA.  Pending CIR for rehab.   Acute subcortical CVA MRA brain and cervical significant for acute infarct in R posterior limb of the IC and inferior posterior R lentiform nucleus.  No large vessel occlusion of head and neck.  No PFO thrombus TTE.  -Appreciate neurology recommendation -Plavix and ASA x 3 wks then ASA alone -LDL above goal, continue Crestor 20 mg -Pending CIR evaluation    HTN Blood pressure continues to improve -Continue amlodipine, Lasix and carvedilol   CKD5 Patient followed by Dr. Royce Macadamia at [facility]. LUE AVF placed, with good thrill and bruit.  No indication for acute dialysis at this time. -Follow-up with nephrology outpatient  Anemia of chronic renal failure This is likely in the setting of kidney disease. Iron studies WNL, iron and ferritin on the lower end of normal.   > Continue Ferrlecit x 4 doses   Pleural effusion Seen on echocardiogram.  Chest x-ray was actually unremarkable.  Patient maybe overloaded in the setting of CKD 5 and hypoalbuminemia.  She has no respiratory concerns at this time.  Plan for dialysis in the near future.  HLD -Continue Crestor 20 mg for secondary prevention, goal LDL <70  Diet: Heart Healthy IVF: None,None VTE: Heparin Code: Full PT/OT recs: CIR, none. TOC recs:   Dispo: Anticipated discharge to Rehab in 2 days pending CIR eval.   Gaylan Gerold, DO 04/18/2022, 6:41 AM Pager: [phone]  Please contact the on call pager after 5 pm and on weekends at [phone].

## 2022-04-18 NOTE — Progress Notes (Signed)
[facility] rehab admissions - I met with patient and her dtr-in-law Delcine at the bedside.  Delcine is an Therapist, sports who works here at [employer] and well known to this admissions Warehouse manager.  Patient does not want SNF placement.  Patient prefers CIR or home with [place] if she progresses well.  Patient has [employer].  I will open the case on Monday and seek authorization for CIR in case that becomes the plan.  I will have my partners follow up on Monday.  Call me for questions.  [phone]

## 2022-04-19 DIAGNOSIS — I639 Cerebral infarction, unspecified: Secondary | ICD-10-CM | POA: Diagnosis not present

## 2022-04-19 LAB — BASIC METABOLIC PANEL
Anion gap: 8 (ref 5–15)
BUN: 51 mg/dL — ABNORMAL HIGH (ref 8–23)
CO2: 25 mmol/L (ref 22–32)
Calcium: 8.3 mg/dL — ABNORMAL LOW (ref 8.9–10.3)
Chloride: 106 mmol/L (ref 98–111)
Creatinine, Ser: 4.99 mg/dL — ABNORMAL HIGH (ref 0.44–1.00)
GFR, Estimated: 8 mL/min — ABNORMAL LOW (ref >60)
Glucose, Bld: 84 mg/dL (ref 70–99)
Potassium: 4 mmol/L (ref 3.5–5.1)
Sodium: 139 mmol/L (ref 135–145)

## 2022-04-19 MED ORDER — FUROSEMIDE 40 MG PO TABS: 40.0000 mg | ORAL_TABLET | Freq: Every evening | ORAL | Status: DC

## 2022-04-19 NOTE — Progress Notes (Signed)
Physical Therapy Treatment Patient Details Name: Gina Bailey MRN: [id number] DOB: [birth year] Today's Date: 04/19/2022   History of Present Illness Pt is a 76 yo F who presented 9/27 with L facial weakness and slurred speech. PMH significant for HTN, DM, HLD, CKD. MRI confirms Acute Lacunar Infarcts at the R lentiform nucleus and L posterior occipital horn    PT Comments    Pt making steady progress with mobility but still requiring assist with all mobility and moderate verbal cues. Pt continues to be motivated to work toward independence and feel she is a good candidate for acute inpatient rehab.    Recommendations for follow up therapy are one component of a multi-disciplinary discharge planning process, led by the attending physician.  Recommendations may be updated based on patient status, additional functional criteria and insurance authorization.  Follow Up Recommendations  Acute inpatient rehab (3hours/day)     Assistance Recommended at Discharge Frequent or constant Supervision/Assistance  Patient can return home with the following A little help with walking and/or transfers;A little help with bathing/dressing/bathroom;Assistance with cooking/housework;Help with stairs or ramp for entrance;Direct supervision/assist for medications management;Assist for transportation   Equipment Recommendations  Rolling walker (2 wheels)    Recommendations for Other Services       Precautions / Restrictions Precautions Precautions: Fall Restrictions Weight Bearing Restrictions: No     Mobility  Bed Mobility Overal bed mobility: Needs Assistance Bed Mobility: Rolling, Sidelying to Sit Rolling: Min assist Sidelying to sit: Min assist       General bed mobility comments: Assist to bring shoulders over to roll. Assist to elevate trunk into sitting and provide support for pt to scoot to EOB    Transfers Overall transfer level: Needs assistance Equipment used: Rolling walker (2  wheels) Transfers: Sit to/from Stand Sit to Stand: Mod assist           General transfer comment: Assist to bring hips up and for balance. Verbal cues for hand placement    Ambulation/Gait Ambulation/Gait assistance: Min assist Gait Distance (Feet): 100 Feet Assistive device: Rolling walker (2 wheels) Gait Pattern/deviations: Step-to pattern, Decreased step length - left, Drifts right/left, Trunk flexed, Narrow base of support, Decreased dorsiflexion - left Gait velocity: decr Gait velocity interpretation: <1.31 ft/sec, indicative of household ambulator   General Gait Details: Assist for balance and support. Pt with left trunk with partial collapse and needs verbal cues to stand more erect to correct. Incr frequency of cues as pt fatigues. Verbal cues to attend to lt side of hallway. Assist to manage lt drift with walker.   Stairs             Wheelchair Mobility    Modified Rankin (Stroke Patients Only) Modified Rankin (Stroke Patients Only) Pre-Morbid Rankin Score: Moderate disability Modified Rankin: Moderately severe disability     Balance Overall balance assessment: Needs assistance, History of Falls Sitting-balance support: Feet supported Sitting balance-Leahy Scale: Good     Standing balance support: During functional activity, Reliant on assistive device for balance, Bilateral upper extremity supported Standing balance-Leahy Scale: Poor Standing balance comment: walker and min guard for static standing                            Cognition Arousal/Alertness: Awake/alert Behavior During Therapy: WFL for tasks assessed/performed Overall Cognitive Status: Impaired/Different from baseline Area of Impairment: Following commands, Awareness, Safety/judgement, Memory, Problem solving  Memory: Decreased short-term memory Following Commands: Follows multi-step commands with increased time Safety/Judgement: Decreased  awareness of safety, Decreased awareness of deficits Awareness: Emergent Problem Solving: Slow processing, Decreased initiation, Requires verbal cues General Comments: lt inattention        Exercises      General Comments        Pertinent Vitals/Pain Pain Assessment Pain Assessment: No/denies pain    Home Living                          Prior Function            PT Goals (current goals can now be found in the care plan section) Acute Rehab PT Goals Patient Stated Goal: to be as independent as she can be Progress towards PT goals: Progressing toward goals    Frequency    Min 4X/week      PT Plan Current plan remains appropriate    Co-evaluation              AM-PAC PT "6 Clicks" Mobility   Outcome Measure  Help needed turning from your back to your side while in a flat bed without using bedrails?: A Little Help needed moving from lying on your back to sitting on the side of a flat bed without using bedrails?: A Little Help needed moving to and from a bed to a chair (including a wheelchair)?: A Little Help needed standing up from a chair using your arms (e.g., wheelchair or bedside chair)?: A Lot Help needed to walk in hospital room?: A Lot (mod cues and assist to manage RW) Help needed climbing 3-5 steps with a railing? : Total 6 Click Score: 14    End of Session Equipment Utilized During Treatment: Gait belt Activity Tolerance: Patient tolerated treatment well Patient left: with call bell/phone within reach;with family/visitor present;in chair;with chair alarm set (grandson present in room) Nurse Communication: Mobility status PT Visit Diagnosis: Other abnormalities of gait and mobility (R26.89);Other symptoms and signs involving the nervous system (R29.898);History of falling (Z91.81);Hemiplegia and hemiparesis Hemiplegia - Right/Left: Left Hemiplegia - dominant/non-dominant: Non-dominant Hemiplegia - caused by: Cerebral infarction      Time: 0829-0850 PT Time Calculation (min) (ACUTE ONLY): 21 min  Charges:  $Gait Training: 8-22 mins                     [place] [employer] [place] 04/19/2022, 10:34 AM

## 2022-04-19 NOTE — Progress Notes (Addendum)
HD#3 Subjective:  Overnight Events: No events.   BP 160/73. BMP stable BUN, Cr.   Patient in chair, eating breakfast this AM. Feels good. Denies chest pain, SOB, abd pain.   Objective:  Vital signs in last 24 hours: Vitals:   04/18/22 2034 04/18/22 2034 04/18/22 2322 04/19/22 0534  BP: 129/63 129/63 (!) 150/76 (!) 160/73  Pulse: 68 69 71 70  Resp: '18 18 18 18  '$ Temp: 97.7 F (36.5 C) 97.7 F (36.5 C) (!) 97.5 F (36.4 C) 97.8 F (36.6 C)  TempSrc: Axillary Axillary Axillary Axillary  SpO2: 99% 100% 99% 98%  Weight:      Height:       Supplemental O2: Room Air SpO2: 98 %   Physical Exam:   GEN: alert, African American female in no acute distress  CARDIO: RRR. No m/r/g. PULM: CTAB. Normal WOB on RA. ABD: soft, non-tender, non-distended.  EXT: No peripheral edema  NEURO: alert and oriented to self, place, year. FTN intact. 5/5 strength in BUE and BLE. L facial droop. No dysarthria noted.    Filed Weights   04/15/22 1512  Weight: 79.2 kg     Intake/Output Summary (Last 24 hours) at 04/19/2022 0620 Last data filed at 04/18/2022 1615 Gross per 24 hour  Intake 576.37 ml  Output --  Net 576.37 ml    Net IO Since Admission: -1,233.63 mL [04/19/22 0620]  Pertinent Labs:    Latest Ref Rng & Units 04/18/2022    7:41 AM 04/17/2022    4:39 AM 04/15/2022    3:48 PM  CBC  WBC 4.0 - 10.5 K/uL 7.4  6.3    Hemoglobin 12.0 - 15.0 g/dL 8.7  8.5  9.2   Hematocrit 36.0 - 46.0 % 27.8  27.5  27.0   Platelets 150 - 400 K/uL 330  343         Latest Ref Rng & Units 04/18/2022    7:41 AM 04/17/2022    4:39 AM 04/15/2022    3:48 PM  CMP  Glucose 70 - 99 mg/dL 81  97    BUN 8 - 23 mg/dL 51  54    Creatinine 0.44 - 1.00 mg/dL 4.94  5.09    Sodium 135 - 145 mmol/L 142  145  141   Potassium 3.5 - 5.1 mmol/L 3.7  3.9  4.3   Chloride 98 - 111 mmol/L 107  106    CO2 22 - 32 mmol/L 25  27    Calcium 8.9 - 10.3 mg/dL 8.3  8.7      Imaging: No results  found.  Assessment/Plan:   Principal Problem:   Acute ischemic stroke ([place]) Active Problems:   Hyperlipidemia associated with type 2 diabetes mellitus ([place])   Hypertension   Anemia of chronic renal failure   CKD (chronic kidney disease), stage V [facility])   Patient Summary: Gina Bailey is a 76yo woman with a history of T2DM with neuropathy, poorly controlled HTN, advanced CKD with new LUE AVF, HLD, former smoker, MR, diastolic HF, who presented with acute subcortical CVA.  Pending CIR for rehab.   Acute subcortical CVA MRA brain and cervical significant for acute infarct in R posterior limb of the IC and inferior posterior R lentiform nucleus.  No large vessel occlusion of head and neck.  No PFO thrombus TTE.  -Appreciate neurology recommendation -Plavix and ASA x 3 wks then ASA alone -LDL above goal, continue Crestor 20 mg -Pending CIR, they will reach  out again on Monday   HTN Blood pressure slightly increased this AM, intermittently normal  and mildly elevated during admission. -Continue amlodipine and carvedilol -resumed home dosing of lasix    CKD5 Patient followed by Dr. Royce [facility]adamia at [facility]. LUE AVF placed, with good thrill and bruit.  No indication for acute dialysis at this time. -Follow-up with nephrology outpatient  Anemia of chronic renal failure This is likely in the setting of kidney disease. Iron studies WNL, iron and ferritin on the lower end of normal.   > Continue Ferrlecit x 4 doses (9/29-10/2)   Pleural effusion Seen on echocardiogram.  Chest x-ray was actually unremarkable.  Patient may be overloaded in the setting of CKD 5 and hypoalbuminemia.  She has no respiratory concerns at this time.  Plan for dialysis in the near future.  HLD -Continue Crestor 20 mg for secondary prevention, goal LDL <70  Diet: Heart Healthy IVF: None,None VTE: Heparin Code: Full PT/OT recs: CIR, none. TOC recs:   Dispo: Anticipated discharge to Rehab in  1 days pending CIR eval.   Rolanda Lundborg, MD 04/19/2022, 6:20 AM Pager: [phone]  Please contact the on call pager after 5 pm and on weekends at [phone].

## 2022-04-20 ENCOUNTER — Inpatient Hospital Stay (HOSPITAL_COMMUNITY): Admission: RE | Admit: 2022-04-20 | Payer: Medicare PPO | Source: Ambulatory Visit

## 2022-04-20 DIAGNOSIS — N185 Chronic kidney disease, stage 5: Secondary | ICD-10-CM | POA: Diagnosis not present

## 2022-04-20 DIAGNOSIS — I120 Hypertensive chronic kidney disease with stage 5 chronic kidney disease or end stage renal disease: Secondary | ICD-10-CM | POA: Diagnosis not present

## 2022-04-20 DIAGNOSIS — Z87891 Personal history of nicotine dependence: Secondary | ICD-10-CM | POA: Diagnosis not present

## 2022-04-20 DIAGNOSIS — I639 Cerebral infarction, unspecified: Secondary | ICD-10-CM | POA: Diagnosis not present

## 2022-04-20 LAB — BASIC METABOLIC PANEL
Anion gap: 12 (ref 5–15)
BUN: 54 mg/dL — ABNORMAL HIGH (ref 8–23)
CO2: 25 mmol/L (ref 22–32)
Calcium: 8.7 mg/dL — ABNORMAL LOW (ref 8.9–10.3)
Chloride: 104 mmol/L (ref 98–111)
Creatinine, Ser: 4.71 mg/dL — ABNORMAL HIGH (ref 0.44–1.00)
GFR, Estimated: 9 mL/min — ABNORMAL LOW (ref >60)
Glucose, Bld: 103 mg/dL — ABNORMAL HIGH (ref 70–99)
Potassium: 4 mmol/L (ref 3.5–5.1)
Sodium: 141 mmol/L (ref 135–145)

## 2022-04-20 MED ORDER — CARVEDILOL 12.5 MG PO TABS
12.5000 mg | ORAL_TABLET | Freq: Two times a day (BID) | ORAL | Status: DC
  Administered 2022-04-20 – 2022-04-22 (×4): 12.5 mg via ORAL
  Filled 2022-04-20 (×4): qty 1

## 2022-04-20 MED ORDER — ORAL CARE MOUTH RINSE: 15.0000 mL | OROMUCOSAL | Status: DC | PRN

## 2022-04-20 MED ORDER — POLYETHYLENE GLYCOL 3350 17 G PO PACK
17.0000 g | PACK | Freq: Every day | ORAL | Status: DC
  Administered 2022-04-20 – 2022-04-22 (×3): 17 g via ORAL
  Filled 2022-04-20 (×3): qty 1

## 2022-04-20 NOTE — Care Management Important Message (Signed)
Important Message  Patient Details  Name: Gina Bailey MRN: [id number] Date of Birth: 1946-03-18   Medicare Important Message Given:  Yes     Orbie Pyo 04/20/2022, 3:55 PM

## 2022-04-20 NOTE — Plan of Care (Signed)
  Problem: Education: Goal: Knowledge of General Education information will improve Description: Including pain rating scale, medication(s)/side effects and non-pharmacologic comfort measures Outcome: Progressing   Problem: Activity: Goal: Risk for activity intolerance will decrease Outcome: Progressing   Problem: Pain Managment: Goal: General experience of comfort will improve Outcome: Progressing   Problem: Skin Integrity: Goal: Risk for impaired skin integrity will decrease Outcome: Progressing   Problem: Self-Care: Goal: Ability to participate in self-care as condition permits will improve Outcome: Progressing

## 2022-04-20 NOTE — Discharge Summary (Signed)
Name: Gina Bailey MRN: [id number] DOB: 1945/11/21 76 y.o. PCP: Angelica Pou, MD  Date of Admission: 04/15/2022  2:40 PM Date of Discharge: 04/22/22 Attending Physician: Dr. Angelia Mould  Discharge Diagnosis: Principal Problem:   Acute ischemic stroke [facility]) Active Problems:   Hyperlipidemia associated with type 2 diabetes mellitus ([place])   Hypertension   Anemia of chronic renal failure   CKD (chronic kidney disease), stage V ([place])   Gout flare    Discharge Medications: Allergies as of 04/22/2022       Reactions   Metformin And Related Other (See Comments)   Could not tolerate metformin due to GI side effects   Pineapple Itching, Swelling   Zestril [lisinopril] Swelling        Medication List     TAKE these medications    Accu-Chek FastClix Lancets Misc USE ONCE DAILY AS DIRECTED TO MONITOR BLOOD SUGAR   Accu-Chek Guide test strip Generic drug: glucose blood Check blood sugar 7 times a week. diag code E11.9. Non insulin dependent   Accu-Chek Nano SmartView w/Device Kit 1 each by Does not apply route 1 day or 1 dose. Uses to check blood sugar once daily in the morning.   amLODipine 10 MG tablet Commonly known as: NORVASC Take 1 tablet (10 mg total) by mouth daily.   aspirin EC 81 MG tablet Take 1 tablet (81 mg total) by mouth daily. Swallow whole.   calcitRIOL 0.25 MCG capsule Commonly known as: ROCALTROL Take 0.25 mcg by mouth 3 (three) times a week.   carvedilol 12.5 MG tablet Commonly known as: COREG Take 1 tablet (12.5 mg total) by mouth 2 (two) times daily with a meal. What changed:  medication strength how much to take when to take this   clopidogrel 75 MG tablet Commonly known as: PLAVIX Take 1 tablet (75 mg total) by mouth daily for 15 days.   furosemide 40 MG tablet Commonly known as: LASIX Take 2 tabs every morning and 1 tab every afternoon or as directed What changed:  how much to take how to take this when to take  this additional instructions   potassium chloride 10 MEQ CR capsule Commonly known as: MICRO-K Take 10 mEq by mouth daily.   predniSONE 20 MG tablet Commonly known as: DELTASONE Take 2 tablets (40 mg total) by mouth daily for 3 days.   rosuvastatin 20 MG tablet Commonly known as: CRESTOR Take 1 tablet (20 mg total) by mouth daily.       Disposition and follow-up:   [name] was discharged from [facility] in Stable condition.  At the hospital follow up visit please address:  1.  Follow-up:              Acute CVA -Patient was started on aspirin and Plavix for 3 weeks and aspirin alone -Crestor 20 mg was started -Follow-up with neurology  CKD 5 She has good urine output during this hospitalization -Follow-up with nephrology as scheduled  Iron deficiency anemia Received 4 transfusion of IV Ferrlecit -Recheck iron study in 4-6 weeks  Hypertension -Coreg was increased to 12.5 mg twice daily -Continue amlodipine -Original prescription for Lasix 80 mg in a.m. and 40 mg in p.m. Patient only takes 80 mg daily.  Please assess for the need of twice daily dosing.  Pleural effusion Found on echocardiogram.  Chest x-ray was unremarkable.  Please reassess for any respiratory symptoms.  Gout flare Patient developed pain on her left big toe  which is swollen and warm to touch.  No history of gout.  She is prone to have gout given her CKD 5.  Patient was started on 3 days of prednisone for symptom relief. -If she has another episode of gout, consider uric acid lowering therapy.   2.  Labs / imaging needed at time of follow-up: CBC, BMP, Iron study and ferritin in 4-6 weeks   3.  Pending labs/ test needing follow-up: NA  Follow-up Appointments:  Follow-up Information     Garvin Fila, MD. Schedule an appointment as soon as possible for a visit in 8 week(s).   Specialties: Neurology, Radiology Contact information: [address] [place] [address] [phone]                Follow-up Dr. Jimmye Norman PCP appointment 05/22/22 Follow-up with Gardiner Barefoot (podiatry) 06/17/2022  Hospital Course by problem list: [name] is a 76 y.o. female with history of CKD, DM, HLD, HTN brought by family to ED for concern of new onset left sided facial weakness and slurred speech and found to have acute lacunar infarct in the R internal capsule and L periventricular white matter.    Acute subcortical CVA Neurology was consulted. MRI with acute infarct in the right posterior limb of the internal capsule and posteroinferior right lentiform nucleus. Additional acute infarct in the occipital lobe periventricular white matter posterior to the left occipital horn. MRA brain with no large vessel occlusion of head and neck.  No PFO thrombus on TTE. Carotid Doppler with 1-39% right ICA and 40-59% left ICA stenosis. Etiology of stroke thought to be due to chronic small vessel disease. They recommended Plavix and ASA x 3 wks then ASA alone. She was started on crestor for secondary prevention.    HTN Permissive HTN was allowed initially. She was restarted on her home medications for HTN and her carvedilol was increased in the setting of elevated Bps. She was only taking 26m of lasix at home.    CKD5 [facility], with good thrill and bruit, will be mature in 05/2022. There was no indication for acute dialysis at this time.   Anemia of chronic renal failure Iron studies WNL, iron and ferritin on the lower end of normal. She received Ferrlecit x 4 doses (9/29-10/2)   Pleural effusion Seen on echocardiogram.  Chest x-ray unremarkable. Thought to be overloaded in the setting of CKD 5 and hypoalbuminemia. She was asymptomatic. Plan for dialysis in the near future.      Subjective: Patient was seen at bedside with family.  She reported pain on her left big toe  that is swollen and very tender to palpation this morning.  Otherwise patient has no other issue.  She is ready to go to inpatient rehab.  Discharge Vitals:   BP (!) 141/62 (BP Location: Right Arm)   Pulse 72   Temp 98.8 F (37.1 C) (Oral)   Resp 18   Ht '5\' 5"'  (1.651 m)   Wt 79.2 kg   LMP 10/22/1978   SpO2 98%   BMI 29.06 kg/m  Discharge exam: Physical Exam Constitutional:      General: She is not in acute distress. HENT:     Head: Normocephalic.  Eyes:     General:        Right eye: No discharge.        Left eye: No discharge.  Conjunctiva/sclera: Conjunctivae normal.  Cardiovascular:     Rate and Rhythm: Normal rate and regular rhythm.     Heart sounds: Normal heart sounds.  Pulmonary:     Effort: Pulmonary effort is normal. No respiratory distress.  Abdominal:     General: Bowel sounds are normal. There is no distension.     Palpations: Abdomen is soft.     Tenderness: There is no abdominal tenderness.  Musculoskeletal:     Right lower leg: No edema.     Left lower leg: No edema.     Comments: Left big toe swollen and warm to touch.  Very tender to palpation.  Skin:    General: Skin is warm.  Neurological:     Mental Status: She is alert and oriented to person, place, and time.  Psychiatric:        Mood and Affect: Mood normal.        Behavior: Behavior normal.       Pertinent Labs, Studies, and Procedures:     Latest Ref Rng & Units 04/18/2022    7:41 AM 04/17/2022    4:39 AM 04/15/2022    3:48 PM  CBC  WBC 4.0 - 10.5 K/uL 7.4  6.3    Hemoglobin 12.0 - 15.0 g/dL 8.7  8.5  9.2   Hematocrit 36.0 - 46.0 % 27.8  27.5  27.0   Platelets 150 - 400 K/uL 330  343         Latest Ref Rng & Units 04/20/2022    2:38 AM 04/19/2022    7:10 AM 04/18/2022    7:41 AM  CMP  Glucose 70 - 99 mg/dL 103  84  81   BUN 8 - 23 mg/dL 54  51  51   Creatinine 0.44 - 1.00 mg/dL 4.71  4.99  4.94   Sodium 135 - 145 mmol/L 141  139  142   Potassium 3.5 - 5.1 mmol/L 4.0  4.0   3.7   Chloride 98 - 111 mmol/L 104  106  107   CO2 22 - 32 mmol/L '25  25  25   ' Calcium 8.9 - 10.3 mg/dL 8.7  8.3  8.3     DG Chest 2 View  Result Date: 04/16/2022 CLINICAL DATA:  IMPRESSION: Cardiomegaly. There are no signs of pulmonary edema or focal pulmonary consolidation.   ECHOCARDIOGRAM COMPLETE BUBBLE STUDY  Result Date: 04/16/2022    ECHOCARDIOGRAM REPORT    1. Left ventricular ejection fraction, by estimation, is 60 to 65%. The left ventricle has normal function. The left ventricle has no regional wall motion abnormalities. There is moderate left ventricular hypertrophy. Left ventricular diastolic parameters are indeterminate.  2. Right ventricular systolic function is normal. The right ventricular size is normal. There is normal pulmonary artery systolic pressure.  3. Large pleural effusion.  4. The mitral valve is normal in structure. No evidence of mitral valve regurgitation.  5. The aortic valve is tricuspid. Aortic valve regurgitation is not visualized.  6. The inferior vena cava is normal in size with greater than 50% respiratory variability, suggesting right atrial pressure of 3 mmHg.  7. Agitated saline contrast bubble study was negative, with no evidence of any interatrial shunt. Comparison(s): No significant change from prior study.  MR BRAIN WO CONTRAST  Result Date: 04/16/2022 CLINICAL DATA:  Left-sided facial droop EXAM: MRI HEAD WITHOUT CONTRAST MRA HEAD WITHOUT CONTRAST MRA NECK WITHOUT CONTRAST TECHNIQUE:  IMPRESSION: 1. Evaluation is limited by motion. Within this limitation, there  is an acute infarct in the right posterior limb of the internal capsule and posteroinferior right lentiform nucleus. 2. Additional acute infarct in the occipital lobe periventricular white matter posterior to the left occipital horn. 3. Evaluation of the intracranial vasculature is significantly motion limited. Within this limitation, no intracranial large vessel occlusion or significant  stenosis. 4. No hemodynamically significant stenosis in the neck, although evaluation is limited by motion.   MR ANGIO NECK WO CONTRAST IMPRESSION: 1. Evaluation is limited by motion. Within this limitation, there is an acute infarct in the right posterior limb of the internal capsule and posteroinferior right lentiform nucleus. 2. Additional acute infarct in the occipital lobe periventricular white matter posterior to the left occipital horn. 3. Evaluation of the intracranial vasculature is significantly motion limited. Within this limitation, no intracranial large vessel occlusion or significant stenosis. 4. No hemodynamically significant stenosis in the neck, although evaluation is limited by motion. Electronically Signed   By: Merilyn Baba M.D.   On: 04/16/2022 02:58   MR ANGIO HEAD WO CONTRAST IMPRESSION: 1. Evaluation is limited by motion. Within this limitation, there is an acute infarct in the right posterior limb of the internal capsule and posteroinferior right lentiform nucleus. 2. Additional acute infarct in the occipital lobe periventricular white matter posterior to the left occipital horn. 3. Evaluation of the intracranial vasculature is significantly motion limited. Within this limitation, no intracranial large vessel occlusion or significant stenosis. 4. No hemodynamically significant stenosis in the neck, although evaluation is limited by motion. Electronically Signed   By: Merilyn Baba M.D.   On: 04/16/2022 02:58   CT HEAD WO CONTRAST IMPRESSION: 1. No CT evidence for acute intracranial abnormality. 2. Chronic small vessel ischemic changes of the white matter Electronically Signed   By: Donavan Foil M.D.   On: 04/15/2022 17:03     Discharge Instructions: Discharge Instructions     Call MD for:  persistant nausea and vomiting   Complete by: As directed    Call MD for:  redness, tenderness, or signs of infection (pain, swelling, redness, odor or green/yellow discharge around  incision site)   Complete by: As directed    Call MD for:  severe uncontrolled pain   Complete by: As directed    Call MD for:  temperature >100.4   Complete by: As directed    Diet - low sodium heart healthy   Complete by: As directed    Increase activity slowly   Complete by: As directed       .risr    Discharge Instructions      Dear Ms. Connery, it has been a pleasure caring for you and I am so happy to see you doing well! You were hospitalized for stroke.   We also continued a lot of your home medications to manage your stroke. In addition to medications, you were also seen by neurologist.   When you are discharged we would like you to do the following:  1. You were started on the following medications in the hospital. Please continue them using these following instructions:  -aspirin and plavix for 16 days then aspirin alone  -started on crestor   2.  Some of your medication regimen has been changed.  Please see the following instructions for the changes: -increased carvedilol to 12.5 BID   3. Please take Prednisone 40 mg for 3 days for your gout attack.   4. Continue taking the rest of the medications as you were before you came  to the hospital.  5. Follow-up with your PCP, neurologist, and nephrologist  Take care and stay healthy!     SignedGaylan Gerold, DO 04/22/2022, 11:38 AM   Pager: [phone]

## 2022-04-20 NOTE — Progress Notes (Signed)
NAME:  Gina Bailey, MRN:  [id number], DOB:  25-Jan-1946, LOS: 4 ADMISSION DATE:  04/15/2022  Subjective  NAEON/Overnight events: None  Patient evaluated at bedside this AM. She is reading a newspaper. Her son and daughter are at bedside. Patient reports feeling okay this morning with children bedside and is oriented to person-place-time. She denies any acute concerns or complaints. Discussed plan to gradually increase blood pressure medications. Bowel regimen was adjusted for some minor constipation. Discharging to acute rehabilitation unit will help her regain strength. Patient expressed understanding of and agreement with this plan.  Objective   Blood pressure (!) 171/82, pulse 76, temperature 98.7 F (37.1 C), temperature source Oral, resp. rate 18, height '5\' 5"'$  (1.651 m), weight 174 lb 9.7 oz (79.2 kg), last menstrual period 10/22/1978, SpO2 100 %.     Intake/Output Summary (Last 24 hours) at 04/20/2022 0622 Last data filed at 04/19/2022 2253 Gross per 24 hour  Intake --  Output 1200 ml  Net -1200 ml   Filed Weights   04/15/22 1512  Weight: 79.2 kg   Physical Exam: General: alert, well-appearing with glasses. In no acute distress.  CV: RRR. No m/r/g. Pulm: CTAB. Normal WOB on RA. Abdomen: Soft, non-tender, non-distended MSK: No peripheral edema Neuro: alert and oriented to person-place-time. 5/5 strength in BUE and BLE. Dysarthric.   Labs       Latest Ref Rng & Units 04/18/2022    7:41 AM 04/17/2022    4:39 AM 04/15/2022    3:48 PM  CBC  WBC 4.0 - 10.5 K/uL 7.4  6.3    Hemoglobin 12.0 - 15.0 g/dL 8.7  8.5  9.2   Hematocrit 36.0 - 46.0 % 27.8  27.5  27.0   Platelets 150 - 400 K/uL 330  343        Latest Ref Rng & Units 04/20/2022    2:38 AM 04/19/2022    7:10 AM 04/18/2022    7:41 AM  BMP  Glucose 70 - 99 mg/dL 103  84  81   BUN 8 - 23 mg/dL 54  51  51   Creatinine 0.44 - 1.00 mg/dL 4.71  4.99  4.94   Sodium 135 - 145 mmol/L 141  139  142   Potassium 3.5 - 5.1  mmol/L 4.0  4.0  3.7   Chloride 98 - 111 mmol/L 104  106  107   CO2 22 - 32 mmol/L '25  25  25   '$ Calcium 8.9 - 10.3 mg/dL 8.7  8.3  8.3    CBC 8.7 > 8.5 BMP Cr 4.99 > 4.71  Summary  [name] is a 76yo woman with a history of T2DM with neuropathy, poorly controlled HTN, advanced CKD with new LUE AVF, HLD, former smoker, MR, diastolic HF, who presented with acute subcortical CVA.  Pending CIR for rehab.  Assessment & Plan:  Principal Problem:   Acute ischemic stroke [facility]) Active Problems:   Hyperlipidemia associated with type 2 diabetes mellitus ([place])   Hypertension   Anemia of chronic renal failure   CKD (chronic kidney disease), stage V ([facility])  Acute subcortical CVA MRA brain and cervical significant for acute infarct in R posterior limb of the IC and inferior posterior R lentiform nucleus.  No large vessel occlusion of head and neck.  No PFO thrombus TTE.  -Appreciate neurology recommendation -Plavix and ASA x 3 wks then ASA alone -LDL above goal, continue Crestor 20 mg -Pending CIR, they will f/u today  HTN Blood pressure increased to 190s, mildly elevated during admission. Allowed for permissive HTN in setting of stroke -Continue amlodipine and lasix -increase carvedilol 12.5 BID -if continued HTN, can consider restarting evening dose of lasix    CKD5 Patient followed by Dr. Royce Macadamia at [facility]. LUE AVF placed, with good thrill and bruit.  No indication for acute dialysis at this time. -Follow-up with nephrology outpatient   Anemia of chronic renal failure This is likely in the setting of kidney disease. Iron studies WNL, iron and ferritin on the lower end of normal.   > Finish Ferrlecit x 4 doses (9/29-10/2)   Pleural effusion Seen on echocardiogram.  Chest x-ray was actually unremarkable.  Patient may be overloaded in the setting of CKD 5 and hypoalbuminemia.  She has no respiratory concerns at this time.  Plan for dialysis in the near future.    HLD -Continue Crestor 20 mg for secondary prevention, goal LDL <70    Best practice:  DIET: Heart Healthy IVF: None DVT PPX: Heparin CODE: Full  Dispo: AIR pending CIR eval Barriers to discharge: insurance [place], MD Internal Medicine Resident PGY-1 PAGER: [phone] 04/20/2022 6:22 AM  If after hours (below), please contact on-call pager: [phone] 5PM-7AM Monday-Friday 1PM-7AM Saturday-Sunday

## 2022-04-20 NOTE — Progress Notes (Signed)
Occupational Therapy Treatment Patient Details Name: Gina Bailey MRN: [id number] DOB: 10-02-1945 Today's Date: 04/20/2022   History of present illness Pt is a 76 yo F who presented 9/27 with L facial weakness and slurred speech. PMH significant for HTN, DM, HLD, CKD. MRI confirms Acute Lacunar Infarcts at the R lentiform nucleus and L posterior occipital horn   OT comments  Ms Rosaria Ferries is making steady progress toward goals. Able to stand with min A with mulitmodal cues to shift weight forward; Mod A with ADL. L inattention noted during tasks. Supportive daughter present during session - educated on minimizing distractions during meals and to check for pocketing L cheek. . Excellent candidate for AIR to maximize functional level of independence to facilitate safe DC home. Will follow.    Recommendations for follow up therapy are one component of a multi-disciplinary discharge planning process, led by the attending physician.  Recommendations may be updated based on patient status, additional functional criteria and insurance authorization.    Follow Up Recommendations  Acute inpatient rehab (3hours/day)    Assistance Recommended at Discharge Frequent or constant Supervision/Assistance  Patient can return home with the following  A lot of help with walking and/or transfers;A lot of help with bathing/dressing/bathroom   Equipment Recommendations  Tub/shower bench    Recommendations for Other Services Rehab consult    Precautions / Restrictions Precautions Precautions: Fall Restrictions Weight Bearing Restrictions: No       Mobility Bed Mobility               General bed mobility comments: OOB in chair    Transfers Overall transfer level: Needs assistance   Transfers: Sit to/from Stand Sit to Stand: Min assist           General transfer comment: multiple attemps however able to stand with min A once ewight shifted anteriorly     Balance Overall balance  assessment: Needs assistance, History of Falls Sitting-balance support: Feet supported Sitting balance-Leahy Scale: Good     Standing balance support: During functional activity, Reliant on assistive device for balance, Bilateral upper extremity supported Standing balance-Leahy Scale: Poor Standing balance comment: walker and min guard for static standing                           ADL either performed or assessed with clinical judgement   ADL   Eating/Feeding: Supervision/ safety   Grooming: Supervision/safety;Set up   Upper Body Bathing: Supervision/ safety;Set up   Lower Body Bathing: Moderate assistance   Upper Body Dressing : Minimal assistance   Lower Body Dressing: Moderate assistance   Toilet Transfer: Moderate assistance   Toileting- Clothing Manipulation and Hygiene: Moderate assistance              Extremity/Trunk Assessment Upper Extremity Assessment Upper Extremity Assessment: LUE deficits/detail LUE Deficits / Details: decreased fine motor/ccordination; slow clumsy movements; unaware L hand resting in tea LUE Coordination: decreased fine motor   Lower Extremity Assessment Lower Extremity Assessment: Defer to PT evaluation        Vision   Eye Alignment:  (? lazy eye) Tracking/Visual Pursuits: Decreased smoothness of horizontal tracking;Decreased smoothness of vertical tracking Saccades: Additional eye shifts occurred during testing Additional Comments: will further assess   Perception Perception Perception: Impaired (L inattention)   Praxis      Cognition Arousal/Alertness: Awake/alert Behavior During Therapy: WFL for tasks assessed/performed Overall Cognitive Status: Impaired/Different from baseline Area of Impairment: Following commands, Awareness,  Safety/judgement, Memory, Problem solving                     Memory: Decreased short-term memory Following Commands: Follows multi-step commands with increased  time Safety/Judgement: Decreased awareness of safety, Decreased awareness of deficits Awareness: Emergent Problem Solving: Slow processing, Decreased initiation, Requires verbal cues General Comments: lt inattention        Exercises      Shoulder Instructions       General Comments      Pertinent Vitals/ Pain          Home Living                                          Prior Functioning/Environment              Frequency  Min 3X/week        Progress Toward Goals  OT Goals(current goals can now be found in the care plan section)  Progress towards OT goals: Progressing toward goals  Acute Rehab OT Goals Patient Stated Goal: to get better OT Goal Formulation: With patient/family Time For Goal Achievement: 04/30/22 Potential to Achieve Goals: Good ADL Goals Pt Will Perform Upper Body Bathing: with supervision;sitting Pt Will Perform Lower Body Bathing: with supervision;sit to/from stand Pt Will Perform Upper Body Dressing: with supervision;sitting Pt Will Perform Lower Body Dressing: with supervision;sit to/from stand Pt Will Transfer to Toilet: with supervision;ambulating Pt Will Perform Toileting - Clothing Manipulation and hygiene: with supervision;sit to/from stand  Plan Discharge plan remains appropriate    Co-evaluation                 AM-PAC OT "6 Clicks" Daily Activity     Outcome Measure   Help from another person eating meals?: A Little Help from another person taking care of personal grooming?: A Little Help from another person toileting, which includes using toliet, bedpan, or urinal?: A Lot Help from another person bathing (including washing, rinsing, drying)?: A Lot Help from another person to put on and taking off regular upper body clothing?: A Little Help from another person to put on and taking off regular lower body clothing?: A Lot 6 Click Score: 15    End of Session Equipment Utilized During Treatment:  Gait belt;Rolling walker (2 wheels)  OT Visit Diagnosis: Unsteadiness on feet (R26.81);Muscle weakness (generalized) (M62.81);Repeated falls (R29.6);Other symptoms and signs involving cognitive function   Activity Tolerance Patient tolerated treatment well   Patient Left in chair;with call bell/phone within reach;with chair alarm set   Nurse Communication Mobility status        Time: 9030-0923 OT Time Calculation (min): 23 min  Charges: OT General Charges $OT Visit: 1 Visit OT Treatments $Self Care/Home Management : 23-37 mins  Maurie Boettcher, OT/L   Acute OT Clinical Specialist [place] Pager [phone] Office [phone]   [facility] 04/20/2022, 12:53 PM

## 2022-04-20 NOTE — Progress Notes (Addendum)
Physical Therapy Treatment Patient Details Name: Gina Bailey MRN: [id number] DOB: 11-21-1945 Today's Date: 04/20/2022   History of Present Illness Pt is a 76 yo F who presented 9/27 with L facial weakness and slurred speech. PMH significant for HTN, DM, HLD, CKD. MRI confirms Acute Lacunar Infarcts at the R lentiform nucleus and L posterior occipital horn    PT Comments    Pt progressing towards physical therapy goals. Was able to perform transfers and ambulation with min-mod assist and RW for support. Pt's family present in room and educated on reason for L side inattention, how to assist her to attend to L side, and positioning of LUE while sitting in chair. Feel AIR remains the most appropriate d/c disposition at this time to maximize functional independence and safety, prior to return home with family support. Will continue to follow and progress as able per POC.     Recommendations for follow up therapy are one component of a multi-disciplinary discharge planning process, led by the attending physician.  Recommendations may be updated based on patient status, additional functional criteria and insurance authorization.  Follow Up Recommendations  Acute inpatient rehab (3hours/day)     Assistance Recommended at Discharge Frequent or constant Supervision/Assistance  Patient can return home with the following A little help with walking and/or transfers;A little help with bathing/dressing/bathroom;Assistance with cooking/housework;Help with stairs or ramp for entrance;Direct supervision/assist for medications management;Assist for transportation   Equipment Recommendations  Rolling walker (2 wheels)    Recommendations for Other Services Rehab consult     Precautions / Restrictions Precautions Precautions: Fall Restrictions Weight Bearing Restrictions: No     Mobility  Bed Mobility               General bed mobility comments: OOB in chair    Transfers Overall transfer  level: Needs assistance Equipment used: Rolling walker (2 wheels) Transfers: Sit to/from Stand Sit to Stand: Mod assist           General transfer comment: Multiple attempts and utilizing momentum. Successful with mod assist and RW for support.    Ambulation/Gait Ambulation/Gait assistance: Mod assist Gait Distance (Feet): 70 Feet Assistive device: Rolling walker (2 wheels) Gait Pattern/deviations: Step-to pattern, Decreased step length - left, Drifts right/left, Trunk flexed, Narrow base of support, Decreased dorsiflexion - left Gait velocity: Decreased Gait velocity interpretation: 1.31 - 2.62 ft/sec, indicative of limited community ambulator   General Gait Details: Assist for balance support and walker management. Pt was able to ambulate out to the hall and back to her room. Distance limited due to need to use bathroom. VC's throughout to attend to the L.   Stairs             Wheelchair Mobility    Modified Rankin (Stroke Patients Only) Modified Rankin (Stroke Patients Only) Pre-Morbid Rankin Score: Moderate disability Modified Rankin: Moderately severe disability     Balance Overall balance assessment: Needs assistance, History of Falls Sitting-balance support: Feet supported Sitting balance-Leahy Scale: Good Sitting balance - Comments: leaning on toilet for hygiene after BM   Standing balance support: During functional activity, Reliant on assistive device for balance, Bilateral upper extremity supported Standing balance-Leahy Scale: Poor                              Cognition Arousal/Alertness: Awake/alert Behavior During Therapy: WFL for tasks assessed/performed Overall Cognitive Status: Impaired/Different from baseline Area of Impairment: Following commands, Awareness, Safety/judgement, Memory,  Problem solving                     Memory: Decreased short-term memory Following Commands: Follows multi-step commands with increased  time Safety/Judgement: Decreased awareness of safety, Decreased awareness of deficits Awareness: Emergent Problem Solving: Slow processing, Decreased initiation, Requires verbal cues General Comments: lt inattention        Exercises      General Comments        Pertinent Vitals/Pain Pain Assessment Pain Assessment: No/denies pain    Home Living                          Prior Function            PT Goals (current goals can now be found in the care plan section) Acute Rehab PT Goals PT Goal Formulation: With patient/family Time For Goal Achievement: 04/30/22 Potential to Achieve Goals: Good Progress towards PT goals: Progressing toward goals    Frequency    Min 2X/week      PT Plan Current plan remains appropriate    Co-evaluation              AM-PAC PT "6 Clicks" Mobility   Outcome Measure  Help needed turning from your back to your side while in a flat bed without using bedrails?: A Little Help needed moving from lying on your back to sitting on the side of a flat bed without using bedrails?: A Little Help needed moving to and from a bed to a chair (including a wheelchair)?: A Little Help needed standing up from a chair using your arms (e.g., wheelchair or bedside chair)?: A Lot Help needed to walk in hospital room?: A Lot Help needed climbing 3-5 steps with a railing? : A Little 6 Click Score: 16    End of Session Equipment Utilized During Treatment: Gait belt Activity Tolerance: Patient tolerated treatment well Patient left: with call bell/phone within reach;with family/visitor present;in chair;with chair alarm set Nurse Communication: Mobility status PT Visit Diagnosis: Other abnormalities of gait and mobility (R26.89);Other symptoms and signs involving the nervous system (R29.898);History of falling (Z91.81);Hemiplegia and hemiparesis Hemiplegia - Right/Left: Left Hemiplegia - dominant/non-dominant: Non-dominant Hemiplegia - caused  by: Cerebral infarction     Time: 1456-1520 PT Time Calculation (min) (ACUTE ONLY): 24 min  Charges:  $Gait Training: 23-37 mins                     Gina Bailey, PT, DPT Acute Rehabilitation Services Secure Chat Preferred Office: [phone]    Gina Bailey 04/20/2022, 3:48 PM

## 2022-04-20 NOTE — Progress Notes (Signed)
Inpatient Rehab Admissions Coordinator:  Saw pt and family at bedside. Informed them that insurance authorization started. Will continue to follow.   Gayland Curry, [place], [place] Admissions Coordinator [phone]

## 2022-04-21 DIAGNOSIS — N185 Chronic kidney disease, stage 5: Secondary | ICD-10-CM | POA: Diagnosis not present

## 2022-04-21 DIAGNOSIS — I120 Hypertensive chronic kidney disease with stage 5 chronic kidney disease or end stage renal disease: Secondary | ICD-10-CM | POA: Diagnosis not present

## 2022-04-21 DIAGNOSIS — Z87891 Personal history of nicotine dependence: Secondary | ICD-10-CM | POA: Diagnosis not present

## 2022-04-21 DIAGNOSIS — I639 Cerebral infarction, unspecified: Secondary | ICD-10-CM | POA: Diagnosis not present

## 2022-04-21 NOTE — Plan of Care (Signed)
  Problem: Education: Goal: Knowledge of General Education information will improve Description: Including pain rating scale, medication(s)/side effects and non-pharmacologic comfort measures Outcome: Progressing   Problem: Activity: Goal: Risk for activity intolerance will decrease Outcome: Progressing   Problem: Safety: Goal: Ability to remain free from injury will improve Outcome: Progressing   Problem: Skin Integrity: Goal: Risk for impaired skin integrity will decrease Outcome: Progressing   Problem: Nutrition: Goal: Risk of aspiration will decrease Outcome: Progressing

## 2022-04-21 NOTE — Progress Notes (Signed)
NAME:  Gina Bailey, MRN:  [id number], DOB:  [birth year], LOS: 5 ADMISSION DATE:  04/15/2022  Subjective  NAEON/Overnight events: None  Patient evaluated at bedside this AM. Patient reports feeling fine this morning, her son was present at bedside. She was oriented to person, place, and year but not month. Discussed plan of discharging to SNF, which is still pending approval from her insurance. All of the patient's questions were answered.  Objective   Blood pressure (!) 112/53, pulse 81, temperature 98 F (36.7 C), temperature source Oral, resp. rate 18, height '5\' 5"'$  (1.651 m), weight 79.2 kg, last menstrual period 10/22/1978, SpO2 100 %.     Intake/Output Summary (Last 24 hours) at 04/21/2022 1113 Last data filed at 04/21/2022 1000 Gross per 24 hour  Intake 580 ml  Output 2551 ml  Net -1971 ml   Filed Weights   04/15/22 1512  Weight: 79.2 kg    Physical Exam: General: chronically ill appearing African American female in no acute distress  CV: RRR. No m/r/g. Pulm: CTAB. Normal WOB on RA. Abdomen: Soft, non-tender, non-distended MSK: No peripheral edema Neuro: alert and oriented to person, place, and year but not month. Speech dysarthric. R gaze preference. EOM intact. 4/5 strength in left UE and LE. 5/5 strength in RUE and RLE.      Labs       Latest Ref Rng & Units 04/18/2022    7:41 AM 04/17/2022    4:39 AM 04/15/2022    3:48 PM  CBC  WBC 4.0 - 10.5 K/uL 7.4  6.3    Hemoglobin 12.0 - 15.0 g/dL 8.7  8.5  9.2   Hematocrit 36.0 - 46.0 % 27.8  27.5  27.0   Platelets 150 - 400 K/uL 330  343        Latest Ref Rng & Units 04/20/2022    2:38 AM 04/19/2022    7:10 AM 04/18/2022    7:41 AM  BMP  Glucose 70 - 99 mg/dL 103  84  81   BUN 8 - 23 mg/dL 54  51  51   Creatinine 0.44 - 1.00 mg/dL 4.71  4.99  4.94   Sodium 135 - 145 mmol/L 141  139  142   Potassium 3.5 - 5.1 mmol/L 4.0  4.0  3.7   Chloride 98 - 111 mmol/L 104  106  107   CO2 22 - 32 mmol/L '25  25  25   '$ Calcium  8.9 - 10.3 mg/dL 8.7  8.3  8.3     Summary  Gina Bailey is a 76yo woman with a history of T2DM with neuropathy, poorly controlled HTN, advanced CKD with new LUE AVF, HLD, former smoker, MR, diastolic HF, who presented with acute subcortical CVA.  Pending CIR for rehab.  Assessment & Plan:  Principal Problem:   Acute ischemic stroke [facility]) Active Problems:   Hyperlipidemia associated with type 2 diabetes mellitus ([place])   Hypertension   Anemia of chronic renal failure   CKD (chronic kidney disease), stage V (HCC)  Acute subcortical CVA MRA brain and cervical significant for acute infarct in R posterior limb of the IC and inferior posterior R lentiform nucleus.  No large vessel occlusion of head and neck.  No PFO thrombus TTE.  -Appreciate neurology recommendation -Plavix and ASA x 3 wks (9/29-10/19) then ASA alone -LDL above goal, continue Crestor 20 mg -Pending insurance auth for CIR    HTN Blood pressure improved with increased carvedilol.  -Continue  amlodipine and lasix -continue carvedilol 12.5 BID -if continued HTN, can consider restarting evening dose of lasix    CKD5 Patient followed by Dr. Royce Macadamia at [facility]. LUE AVF placed, with good thrill and bruit, will be mature in 05/2022.  No indication for acute dialysis at this time.  -Follow-up with nephrology outpatient   Anemia of chronic renal failure This is likely in the setting of kidney disease. Iron studies WNL, iron and ferritin on the lower end of normal. S/p Ferrlecit x 4 doses (9/29-10/2)   Pleural effusion Seen on echocardiogram.  Chest x-ray was actually unremarkable. Patient may be overloaded in the setting of CKD 5 and hypoalbuminemia.  She has no respiratory concerns at this time.  Plan for dialysis in the future.   HLD -Continue Crestor 20 mg for secondary prevention, goal LDL <70  Best practice:  DIET: Heart Healthy IVF: None DVT PPX: Heparin CODE: Full  PT/OT recs: AIR Dispo:  AIR Barriers to discharge: bed at [place], MD Internal Medicine Resident PGY-1 PAGER: [phone] 04/21/2022 6:08 AM  If after hours (below), please contact on-call pager: [phone] 5PM-7AM Monday-Friday 1PM-7AM Saturday-Sunday

## 2022-04-21 NOTE — Progress Notes (Signed)
Mobility Specialist: Progress Note   04/21/22 1419  Mobility  Activity Ambulated with assistance in hallway  Activity Response Tolerated well  Distance Ambulated (ft) 200 ft  $Mobility charge 1 Mobility  Level of Assistance Minimal assist, patient does 75% or more  Assistive Device Front wheel walker   Pt received in the chair and agreeable to mobility. Stopped x3 for standing breaks secondary to fatigue. Required verbal cues for RW proximity and upright posture as pt fatigued. Pt back to the chair after session with call bell at her side.  [facility] Toniesha Zellner Mobility Specialist Mobility Specialist [address]: [phone]

## 2022-04-21 NOTE — Progress Notes (Signed)
Inpatient Rehab Admissions Coordinator:    I received insurance auth but do not have an available bed for this Pt. On CIR today. I will follow for potential admit pending bed availability tomorrow.   Clemens Catholic, [place], [place] Admissions Coordinator  [phone] ([place]) [phone] (office)

## 2022-04-22 ENCOUNTER — Inpatient Hospital Stay (HOSPITAL_COMMUNITY)
Admission: RE | Admit: 2022-04-22 | Discharge: 2022-05-02 | DRG: 056 | Disposition: A | Discharge status: Home Health | Payer: Medicare PPO | Source: Intra-hospital | Attending: Physical Medicine & Rehabilitation | Admitting: Physical Medicine & Rehabilitation

## 2022-04-22 ENCOUNTER — Other Ambulatory Visit: Payer: Self-pay

## 2022-04-22 ENCOUNTER — Encounter (HOSPITAL_COMMUNITY): Payer: Self-pay | Admitting: Physical Medicine & Rehabilitation

## 2022-04-22 DIAGNOSIS — I672 Cerebral atherosclerosis: Secondary | ICD-10-CM | POA: Diagnosis not present

## 2022-04-22 DIAGNOSIS — E1122 Type 2 diabetes mellitus with diabetic chronic kidney disease: Secondary | ICD-10-CM | POA: Diagnosis present

## 2022-04-22 DIAGNOSIS — I69398 Other sequelae of cerebral infarction: Principal | ICD-10-CM

## 2022-04-22 DIAGNOSIS — N185 Chronic kidney disease, stage 5: Secondary | ICD-10-CM | POA: Diagnosis not present

## 2022-04-22 DIAGNOSIS — R410 Disorientation, unspecified: Secondary | ICD-10-CM | POA: Diagnosis not present

## 2022-04-22 DIAGNOSIS — R531 Weakness: Secondary | ICD-10-CM | POA: Diagnosis present

## 2022-04-22 DIAGNOSIS — M10372 Gout due to renal impairment, left ankle and foot: Secondary | ICD-10-CM

## 2022-04-22 DIAGNOSIS — R600 Localized edema: Secondary | ICD-10-CM | POA: Diagnosis present

## 2022-04-22 DIAGNOSIS — E785 Hyperlipidemia, unspecified: Secondary | ICD-10-CM | POA: Diagnosis present

## 2022-04-22 DIAGNOSIS — I69354 Hemiplegia and hemiparesis following cerebral infarction affecting left non-dominant side: Secondary | ICD-10-CM | POA: Diagnosis not present

## 2022-04-22 DIAGNOSIS — Z6827 Body mass index (BMI) 27.0-27.9, adult: Secondary | ICD-10-CM | POA: Diagnosis not present

## 2022-04-22 DIAGNOSIS — Z888 Allergy status to other drugs, medicaments and biological substances status: Secondary | ICD-10-CM

## 2022-04-22 DIAGNOSIS — I639 Cerebral infarction, unspecified: Secondary | ICD-10-CM

## 2022-04-22 DIAGNOSIS — Z87891 Personal history of nicotine dependence: Secondary | ICD-10-CM | POA: Diagnosis not present

## 2022-04-22 DIAGNOSIS — R1312 Dysphagia, oropharyngeal phase: Secondary | ICD-10-CM | POA: Diagnosis not present

## 2022-04-22 DIAGNOSIS — Z803 Family history of malignant neoplasm of breast: Secondary | ICD-10-CM | POA: Diagnosis not present

## 2022-04-22 DIAGNOSIS — E1140 Type 2 diabetes mellitus with diabetic neuropathy, unspecified: Secondary | ICD-10-CM | POA: Diagnosis present

## 2022-04-22 DIAGNOSIS — R1310 Dysphagia, unspecified: Secondary | ICD-10-CM | POA: Diagnosis not present

## 2022-04-22 DIAGNOSIS — I6523 Occlusion and stenosis of bilateral carotid arteries: Secondary | ICD-10-CM | POA: Diagnosis present

## 2022-04-22 DIAGNOSIS — I6930 Unspecified sequelae of cerebral infarction: Secondary | ICD-10-CM | POA: Diagnosis present

## 2022-04-22 DIAGNOSIS — M109 Gout, unspecified: Secondary | ICD-10-CM | POA: Diagnosis present

## 2022-04-22 DIAGNOSIS — R350 Frequency of micturition: Secondary | ICD-10-CM | POA: Diagnosis not present

## 2022-04-22 DIAGNOSIS — Z7982 Long term (current) use of aspirin: Secondary | ICD-10-CM | POA: Diagnosis not present

## 2022-04-22 DIAGNOSIS — R5381 Other malaise: Secondary | ICD-10-CM | POA: Diagnosis present

## 2022-04-22 DIAGNOSIS — Z86010 Personal history of colonic polyps: Secondary | ICD-10-CM

## 2022-04-22 DIAGNOSIS — I6359 Cerebral infarction due to unspecified occlusion or stenosis of other cerebral artery: Secondary | ICD-10-CM | POA: Diagnosis not present

## 2022-04-22 DIAGNOSIS — Z833 Family history of diabetes mellitus: Secondary | ICD-10-CM | POA: Diagnosis not present

## 2022-04-22 DIAGNOSIS — M1030 Gout due to renal impairment, unspecified site: Secondary | ICD-10-CM

## 2022-04-22 DIAGNOSIS — E669 Obesity, unspecified: Secondary | ICD-10-CM | POA: Diagnosis present

## 2022-04-22 DIAGNOSIS — I10 Essential (primary) hypertension: Secondary | ICD-10-CM | POA: Diagnosis not present

## 2022-04-22 DIAGNOSIS — Z9109 Other allergy status, other than to drugs and biological substances: Secondary | ICD-10-CM

## 2022-04-22 DIAGNOSIS — R4182 Altered mental status, unspecified: Secondary | ICD-10-CM | POA: Diagnosis not present

## 2022-04-22 DIAGNOSIS — I69320 Aphasia following cerebral infarction: Secondary | ICD-10-CM

## 2022-04-22 DIAGNOSIS — I69391 Dysphagia following cerebral infarction: Secondary | ICD-10-CM

## 2022-04-22 DIAGNOSIS — D631 Anemia in chronic kidney disease: Secondary | ICD-10-CM | POA: Diagnosis present

## 2022-04-22 DIAGNOSIS — R29705 NIHSS score 5: Secondary | ICD-10-CM | POA: Diagnosis not present

## 2022-04-22 DIAGNOSIS — I4891 Unspecified atrial fibrillation: Secondary | ICD-10-CM | POA: Diagnosis not present

## 2022-04-22 DIAGNOSIS — Z7902 Long term (current) use of antithrombotics/antiplatelets: Secondary | ICD-10-CM | POA: Diagnosis not present

## 2022-04-22 DIAGNOSIS — M10072 Idiopathic gout, left ankle and foot: Secondary | ICD-10-CM | POA: Diagnosis present

## 2022-04-22 DIAGNOSIS — I1311 Hypertensive heart and chronic kidney disease without heart failure, with stage 5 chronic kidney disease, or end stage renal disease: Secondary | ICD-10-CM | POA: Diagnosis not present

## 2022-04-22 DIAGNOSIS — Z79899 Other long term (current) drug therapy: Secondary | ICD-10-CM

## 2022-04-22 DIAGNOSIS — R29818 Other symptoms and signs involving the nervous system: Secondary | ICD-10-CM | POA: Diagnosis not present

## 2022-04-22 DIAGNOSIS — I69322 Dysarthria following cerebral infarction: Secondary | ICD-10-CM

## 2022-04-22 DIAGNOSIS — D649 Anemia, unspecified: Secondary | ICD-10-CM

## 2022-04-22 DIAGNOSIS — I69392 Facial weakness following cerebral infarction: Secondary | ICD-10-CM

## 2022-04-22 DIAGNOSIS — K5900 Constipation, unspecified: Secondary | ICD-10-CM | POA: Diagnosis present

## 2022-04-22 HISTORY — DX: Gout due to renal impairment, unspecified site: M10.30

## 2022-04-22 LAB — CBC
HCT: 25.9 % — ABNORMAL LOW (ref 36.0–46.0)
Hemoglobin: 8 g/dL — ABNORMAL LOW (ref 12.0–15.0)
MCH: 26.3 pg (ref 26.0–34.0)
MCHC: 30.9 g/dL (ref 30.0–36.0)
MCV: 85.2 fL (ref 80.0–100.0)
Platelets: 313 10*3/uL (ref 150–400)
RBC: 3.04 MIL/uL — ABNORMAL LOW (ref 3.87–5.11)
RDW: 16.6 % — ABNORMAL HIGH (ref 11.5–15.5)
WBC: 7.7 10*3/uL (ref 4.0–10.5)
nRBC: 0 % (ref 0.0–0.2)

## 2022-04-22 MED ORDER — HEPARIN SODIUM (PORCINE) 5000 UNIT/ML IJ SOLN
5000.0000 [IU] | Freq: Three times a day (TID) | INTRAMUSCULAR | Status: DC
  Administered 2022-04-22 – 2022-04-27 (×14): 5000 [IU] via SUBCUTANEOUS
  Filled 2022-04-22 (×14): qty 1

## 2022-04-22 MED ORDER — CLOPIDOGREL BISULFATE 75 MG PO TABS
75.0000 mg | ORAL_TABLET | Freq: Every day | ORAL | Status: DC
  Administered 2022-04-23 – 2022-05-02 (×10): 75 mg via ORAL
  Filled 2022-04-22 (×10): qty 1

## 2022-04-22 MED ORDER — PROCHLORPERAZINE MALEATE 5 MG PO TABS: 5.0000 mg | ORAL_TABLET | Freq: Four times a day (QID) | ORAL | Status: DC | PRN

## 2022-04-22 MED ORDER — PROCHLORPERAZINE 25 MG RE SUPP: 12.5000 mg | Freq: Four times a day (QID) | RECTAL | Status: DC | PRN

## 2022-04-22 MED ORDER — PREDNISONE 20 MG PO TABS
40.0000 mg | ORAL_TABLET | Freq: Every day | ORAL | Status: AC
  Administered 2022-04-23 – 2022-04-25 (×3): 40 mg via ORAL
  Filled 2022-04-22 (×3): qty 2

## 2022-04-22 MED ORDER — ROSUVASTATIN CALCIUM 20 MG PO TABS: 20.0000 mg | ORAL_TABLET | Freq: Every day | ORAL | 1 refills | Status: DC

## 2022-04-22 MED ORDER — DIPHENHYDRAMINE HCL 12.5 MG/5ML PO ELIX: 12.5000 mg | ORAL_SOLUTION | Freq: Four times a day (QID) | ORAL | Status: DC | PRN

## 2022-04-22 MED ORDER — CARVEDILOL 12.5 MG PO TABS
12.5000 mg | ORAL_TABLET | Freq: Two times a day (BID) | ORAL | Status: DC
  Administered 2022-04-22 – 2022-04-27 (×10): 12.5 mg via ORAL
  Filled 2022-04-22 (×10): qty 1

## 2022-04-22 MED ORDER — FUROSEMIDE 40 MG PO TABS
80.0000 mg | ORAL_TABLET | Freq: Every day | ORAL | Status: DC
  Administered 2022-04-23 – 2022-05-02 (×10): 80 mg via ORAL
  Filled 2022-04-22 (×11): qty 2

## 2022-04-22 MED ORDER — AMLODIPINE BESYLATE 10 MG PO TABS
10.0000 mg | ORAL_TABLET | Freq: Every day | ORAL | Status: DC
  Administered 2022-04-23 – 2022-04-27 (×5): 10 mg via ORAL
  Filled 2022-04-22 (×5): qty 1

## 2022-04-22 MED ORDER — METHOCARBAMOL 500 MG PO TABS: 500.0000 mg | ORAL_TABLET | Freq: Four times a day (QID) | ORAL | Status: DC | PRN

## 2022-04-22 MED ORDER — PREDNISONE 20 MG PO TABS
40.0000 mg | ORAL_TABLET | Freq: Every day | ORAL | Status: DC
  Administered 2022-04-22: 40 mg via ORAL
  Filled 2022-04-22: qty 2

## 2022-04-22 MED ORDER — PREDNISONE 20 MG PO TABS: 40.0000 mg | ORAL_TABLET | Freq: Every day | ORAL | Status: DC

## 2022-04-22 MED ORDER — SORBITOL 70 % SOLN: 30.0000 mL | Freq: Every day | Status: DC | PRN

## 2022-04-22 MED ORDER — CLOPIDOGREL BISULFATE 75 MG PO TABS: 75.0000 mg | ORAL_TABLET | Freq: Every day | ORAL | 0 refills | Status: DC

## 2022-04-22 MED ORDER — ROSUVASTATIN CALCIUM 20 MG PO TABS
20.0000 mg | ORAL_TABLET | Freq: Every day | ORAL | Status: DC
  Administered 2022-04-23 – 2022-05-02 (×10): 20 mg via ORAL
  Filled 2022-04-22 (×10): qty 1

## 2022-04-22 MED ORDER — HEPARIN SODIUM (PORCINE) 5000 UNIT/ML IJ SOLN: 5000.0000 [IU] | Freq: Three times a day (TID) | INTRAMUSCULAR | Status: DC

## 2022-04-22 MED ORDER — SENNOSIDES-DOCUSATE SODIUM 8.6-50 MG PO TABS: 1.0000 | ORAL_TABLET | Freq: Every evening | ORAL | Status: DC | PRN

## 2022-04-22 MED ORDER — FLEET ENEMA 7-19 GM/118ML RE ENEM: 1.0000 | ENEMA | Freq: Once | RECTAL | Status: DC | PRN

## 2022-04-22 MED ORDER — PROCHLORPERAZINE EDISYLATE 10 MG/2ML IJ SOLN: 5.0000 mg | Freq: Four times a day (QID) | INTRAMUSCULAR | Status: DC | PRN

## 2022-04-22 MED ORDER — TRAZODONE HCL 50 MG PO TABS: 25.0000 mg | ORAL_TABLET | Freq: Every evening | ORAL | Status: DC | PRN

## 2022-04-22 MED ORDER — ASPIRIN 81 MG PO TBEC: 81.0000 mg | DELAYED_RELEASE_TABLET | Freq: Every day | ORAL | 1 refills | Status: DC

## 2022-04-22 MED ORDER — ASPIRIN 81 MG PO TBEC
81.0000 mg | DELAYED_RELEASE_TABLET | Freq: Every day | ORAL | Status: DC
  Administered 2022-04-23 – 2022-05-02 (×10): 81 mg via ORAL
  Filled 2022-04-22 (×10): qty 1

## 2022-04-22 MED ORDER — CARVEDILOL 12.5 MG PO TABS: 12.5000 mg | ORAL_TABLET | Freq: Two times a day (BID) | ORAL | 1 refills | Status: DC

## 2022-04-22 MED ORDER — CALCITRIOL 0.25 MCG PO CAPS
0.2500 ug | ORAL_CAPSULE | ORAL | Status: DC
  Administered 2022-04-24 – 2022-05-01 (×4): 0.25 ug via ORAL
  Filled 2022-04-22 (×4): qty 1

## 2022-04-22 MED ORDER — GUAIFENESIN-DM 100-10 MG/5ML PO SYRP: 5.0000 mL | ORAL_SOLUTION | Freq: Four times a day (QID) | ORAL | Status: DC | PRN

## 2022-04-22 MED ORDER — ACETAMINOPHEN 325 MG PO TABS: 325.0000 mg | ORAL_TABLET | ORAL | Status: DC | PRN

## 2022-04-22 NOTE — Progress Notes (Signed)
Report given to Kennyth Lose RN at [facility] reflecting patient's current status, family notified.

## 2022-04-22 NOTE — TOC Transition Note (Signed)
Transition of Care [facility]) - CM/SW Discharge Note   Patient Details  Name: Gina Bailey MRN: [id number] Date of Birth: Dec 06, 1945  Transition of Care [facility]) CM/SW Contact:  Pollie Friar, RN Phone Number: 04/22/2022, 11:31 AM   Clinical Narrative:    Pt is discharging to CIR today. CM signing off.    Final next level of care: IP Rehab Facility Barriers to Discharge: No Barriers Identified   Patient Goals and CMS Choice   CMS Medicare.gov Compare Post Acute Care list provided to:: Patient Choice offered to / list presented to : Patient  Discharge Placement                       Discharge Plan and Services                                     Social Determinants of Health (SDOH) Interventions     Readmission Risk Interventions     No data to display

## 2022-04-22 NOTE — H&P (Signed)
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 [place] CTH negative.  Neurology consulted.  MRI with findings of right posterior limb internal capsule as well as left tiny periventricular white matter lacunar infarcts.  MR angiogram of the brain and neck is suboptimal but showed no large vessel stenosis or occlusion.  Etiology likely secondary to chronic small vessel disease.  2D echo with left ventricular ejection fraction 60 to 65%, LDL 135 and hemoglobin A1c 5.4.  She was started on heparin subcutaneously for VTE prophylaxis.  Aspirin 81 mg daily and clopidogrel 75 mg daily for 3 weeks initiated on 9/28 with recommendations to continue aspirin alone.  She was started on Crestor 20 mg daily.  Her neurologic deficits have improved.  She is tolerating heart healthy/D3 diet and thin liquids.  Acute pain of left great toe which on physical exam appeared swollen and warm to touch.  Primary team has initiated prednisone for 3 days. Having BMs. Using [place].  She has CKD 5 followed by Dr. Royce Macadamia at [facility] LUE AVF as placed, to mature at 11/23. No indication for acute dialysis currently. The patient requires inpatient physical medicine and rehabilitation evaluations and treatment secondary to dysfunction due to right internal capsule infarct.  She lives with her son, Gina Bailey.  Review of Systems  Constitutional:  Positive for chills. Negative for fever.  HENT:  Negative for congestion and sore throat.   Eyes:  Negative for blurred vision and double vision.  Respiratory:  Negative for cough and shortness of breath.   Cardiovascular:  Negative for chest pain and palpitations.  Gastrointestinal:  Negative for abdominal pain, constipation, nausea and vomiting.  Genitourinary:   Negative for dysuria, frequency and urgency.  Musculoskeletal:        Complaining of left medial,GT pain  Neurological:  Positive for speech change and weakness. Negative for dizziness, sensory change and headaches.    Past Surgical History:  Procedure Laterality Date   APPENDECTOMY     AV FISTULA PLACEMENT Left 03/10/2022   Procedure: LEFT ARM ARTERIOVENOUS (AV) FISTULA CREATION VERSUS GRAFT;  Surgeon: Waynetta Sandy, MD;  Location: [place];  Service: Vascular;  Laterality: Left;   COLON SURGERY  08/2006   Partial right colectomy and anastomosis with resection of villous adenoma.   HEMORRHOID SURGERY     hemorrhoidectomy   TONSILLECTOMY     TUBAL LIGATION     VENTRAL HERNIA REPAIR  08/2008   By Dr. Hulen Skains.   Family History  Problem Relation Age of Onset   Diabetes Other        1st degree reative in 3 of her siblings   Breast cancer Mother        not sure of age   Social History:  reports that she quit smoking about 22 years ago. Her smoking use included cigarettes. She has a 0.30 pack-year smoking history. She has never used smokeless tobacco. She reports that she does not drink alcohol and does not use drugs. Allergies:  Allergies  Allergen Reactions   Metformin And Related Other (See Comments)    Could not tolerate metformin due to GI side effects   Pineapple Itching and Swelling   Zestril [Lisinopril] Swelling   Medications Prior to Admission  Medication Sig Dispense Refill  Accu-Chek FastClix Lancets MISC USE ONCE DAILY AS DIRECTED TO MONITOR BLOOD SUGAR 102 each 6   amLODipine (NORVASC) 10 MG tablet Take 1 tablet (10 mg total) by mouth daily. 90 tablet 3   aspirin EC 81 MG tablet Take 1 tablet (81 mg total) by mouth daily. Swallow whole. 30 tablet 1   Blood Glucose Monitoring Suppl (ACCU-CHEK NANO SMARTVIEW) w/Device KIT 1 each by Does not apply route 1 day or 1 dose. Uses to check blood sugar once daily in the morning. 3 kit 3   calcitRIOL (ROCALTROL) 0.25 MCG  capsule Take 0.25 mcg by mouth 3 (three) times a week.     carvedilol (COREG) 12.5 MG tablet Take 1 tablet (12.5 mg total) by mouth 2 (two) times daily with a meal. 60 tablet 1   clopidogrel (PLAVIX) 75 MG tablet Take 1 tablet (75 mg total) by mouth daily for 15 days. 15 tablet 0   furosemide (LASIX) 40 MG tablet Take 2 tabs every morning and 1 tab every afternoon or as directed (Patient taking differently: Take 80 mg by mouth daily.) 90 tablet 5   glucose blood (ACCU-CHEK GUIDE) test strip Check blood sugar 7 times a week. diag code E11.9. Non insulin dependent 100 each 3   potassium chloride (MICRO-K) 10 MEQ CR capsule Take 10 mEq by mouth daily.     predniSONE (DELTASONE) 20 MG tablet Take 2 tablets (40 mg total) by mouth daily for 3 days.     rosuvastatin (CRESTOR) 20 MG tablet Take 1 tablet (20 mg total) by mouth daily. 30 tablet 1     Home: Home Living Family/patient expects to be discharged to:: Private residence Living Arrangements: Children Available Help at Discharge: Family Type of Home: House Home Access: Stairs to enter [employer] of Steps: 1 step up onto [place]: One level Bathroom Shower/Tub: Chiropodist: [place]: None Additional Comments: DIL was present initially and reported family could likely make 24/7 supervision work  Lives With: Son   Functional History: Prior Function Prior Level of Function : Independent/Modified Independent, History of Falls (last six months) Mobility Comments: No device but had several falls reports 2 recent falls one in parking lot over parking tie, does not drive ADLs Comments: Independent using some type of stool to get into bathroom per her report, DIL couldn't confirm   Functional Status:  Mobility: Bed Mobility Overal bed mobility: Needs Assistance Bed Mobility: Rolling, Sidelying to Sit Rolling: Min assist Sidelying to sit: Min assist Supine to sit: Min assist Sit to  sidelying: Mod assist General bed mobility comments: OOB in chair Transfers Overall transfer level: Needs assistance Equipment used: Rolling walker (2 wheels) Transfers: Sit to/from Stand Sit to Stand: Mod assist Bed to/from chair/wheelchair/BSC transfer type:: Stand pivot Stand pivot transfers: Mod assist General transfer comment: Multiple attempts and utilizing momentum. Successful with mod assist and RW for support. Ambulation/Gait Ambulation/Gait assistance: Mod assist Gait Distance (Feet): 70 Feet Assistive device: Rolling walker (2 wheels) Gait Pattern/deviations: Step-to pattern, Decreased step length - left, Drifts right/left, Trunk flexed, Narrow base of support, Decreased dorsiflexion - left General Gait Details: Assist for balance support and walker management. Pt was able to ambulate out to the hall and back to her room. Distance limited due to need to use bathroom. VC's throughout to attend to the L. Gait velocity: Decreased Gait velocity interpretation: 1.31 - 2.62 ft/sec, indicative of limited community ambulator   ADL: ADL Overall ADL's : Needs assistance/impaired Eating/Feeding:  Supervision/ safety Grooming: Supervision/safety, Set up Upper Body Bathing: Supervision/ safety, Set up Lower Body Bathing: Moderate assistance Upper Body Dressing : Minimal assistance Lower Body Dressing: Moderate assistance Toilet Transfer: Moderate assistance Toileting- Clothing Manipulation and Hygiene: Moderate assistance Functional mobility during ADLs: Moderate assistance, Cueing for safety, Cueing for sequencing General ADL Comments: Mod A for LB ADLS, (S)-min A for UB, cueing for technique   Cognition: Cognition Overall Cognitive Status: Impaired/Different from baseline Arousal/Alertness: Awake/alert Orientation Level: Oriented to person, Disoriented to time, Disoriented to situation, Oriented to place Year: 2023 Month: September Day of Week: Incorrect Attention: Focused,  Sustained, Selective Focused Attention: Appears intact Sustained Attention: Appears intact Selective Attention: Impaired Selective Attention Impairment: Verbal complex Memory: Impaired Memory Impairment: Decreased short term memory, Decreased recall of new information (Immediate: 5/5; delayed: 0/5; with cues: 3/5) Awareness: Impaired Awareness Impairment: Intellectual impairment Problem Solving: Impaired Problem Solving Impairment: Verbal complex (money: 0/3; time: 0/1) Executive Function: Sequencing, Technical brewer: Impaired Sequencing Impairment: Verbal complex (clock: 2/4; verbal sequencing: multiple steps missing) Organizing: Impaired Organizing Impairment: Verbal complex (backward digit span: 1/2) Cognition Arousal/Alertness: Awake/alert Behavior During Therapy: WFL for tasks assessed/performed Overall Cognitive Status: Impaired/Different from baseline Area of Impairment: Following commands, Awareness, Safety/judgement, Memory, Problem solving Memory: Decreased short-term memory Following Commands: Follows multi-step commands with increased time Safety/Judgement: Decreased awareness of safety, Decreased awareness of deficits Awareness: Emergent Problem Solving: Slow processing, Decreased initiation, Requires verbal cues General Comments: lt inattention   Physical Exam: Blood pressure 126/79, pulse 69, temperature 98.5 F (36.9 C), temperature source Oral, resp. rate 17, height '5\' 6"'  (1.676 m), weight 77 kg, last menstrual period 10/22/1978, SpO2 99 %.   General: No apparent distress HEENT: Head is normocephalic, atraumatic, PERRLA, EOMI, sclera anicteric, oral mucosa pink and moist,   Neck: Supple without JVD or lymphadenopathy Heart: Reg rate and rhythm. No murmurs rubs or gallops Chest: CTA bilaterally without wheezes, rales, or rhonchi; no distress Abdomen: Soft, non-tender, non-distended, bowel sounds positive. Extremities: No clubbing, cyanosis, Pulses are  2+ Psych: Pt's affect is flat Skin: Clean and intact without signs of breakdown Neuro:  Alert to person and date only, not able to say what brought her to the hospital, decreased memory, follows simple commands, CN 2-12 intact orther than L facial weakness, L inattention, she has dysarthria Sensation intact to LT in all 4 extremities Strength 5/5 in b/l UE and RLE, strength at least 4-/5 in proximal LLE and at least 2/5 in distal LLE however she provides limited effort due to pain FTN intact b/l Musculoskeletal:  Trace edema, left GT, 1st metatarsal head TTP  IV RUE   Results for orders placed or performed during the hospital encounter of 04/22/22 (from the past 48 hour(s))  CBC     Status: Abnormal   Collection Time: 04/22/22  3:32 PM  Result Value Ref Range   WBC 7.7 4.0 - 10.5 K/uL   RBC 3.04 (L) 3.87 - 5.11 MIL/uL   Hemoglobin 8.0 (L) 12.0 - 15.0 g/dL   HCT 25.9 (L) 36.0 - 46.0 %   MCV 85.2 80.0 - 100.0 fL   MCH 26.3 26.0 - 34.0 pg   MCHC 30.9 30.0 - 36.0 g/dL   RDW 16.6 (H) 11.5 - 15.5 %   Platelets 313 150 - 400 K/uL   nRBC 0.0 0.0 - 0.2 %    Comment: Performed at [facility], [address] [place],  [address]   No results found.    Blood pressure  126/79, pulse 69, temperature 98.5 F (36.9 C), temperature source Oral, resp. rate 17, height '5\' 6"'  (1.676 m), weight 77 kg, last menstrual period 10/22/1978, SpO2 99 %.  Medical Problem List and Plan: 1. Functional deficits secondary to right posterior limb internal capsule as well as left tiny periventricular white matter lacunar infarcts with dysarthria  -patient may shower  -ELOS/Goals: 7-10 days  -Admit to CIR 2.  Antithrombotics: -DVT/anticoagulation:  Pharmaceutical: Heparin  -antiplatelet therapy: aspirin 81 mg and Plavix for 3 weeks then aspirin alone 3. Pain Management: Tylenol as needed 4. Mood/Behavior/Sleep: LCSW to evaluate and provide emotional support  -antipsychotic agents: none 5.  Neuropsych/cognition: This patient does capable of making decisions on her own behalf. 6. Skin/Wound Care: Routine skin care checks 7. 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 Glucose stable on last BMP 10: Chronic BLE edema: minimal currently -continue Lasix 80 mg q AM (40 mg q PM discontinued)  -continue compression stockings when OOB 11: Hypertension: monitor  -continue amlodipine 10 mg daily  -continue carvedilol 12.5 mg BID  -continue Lasix 12: Diabetic neuropathy both feet: follows with podiatry 13: Hyperlipidemia: Crestor 20 mg daily 14: Anemia of chronic renal failure: received 4 transfusion of IV Ferrlecit; follow-up CBC 15: Gout flare: prednisone for 3 days 16: Obesity: dietary counseling 17: Bilateral carotid artery stenosis: outpatient surveillance 18. Dysphagia  -SLP eval, dys 3 diet with thin liquids   Barbie Banner, PA-C 04/22/2022   I have personally performed a face to face diagnostic evaluation of this patient and formulated the key components of the plan.  Additionally, I have personally reviewed laboratory data, imaging studies, as well as relevant notes and concur with the physician assistant's documentation above.  The patient's status has not changed from the original H&P.  Any changes in documentation from the acute care chart have been noted above.  Jennye Boroughs, MD, [facility]

## 2022-04-22 NOTE — Progress Notes (Signed)
Inpatient Rehabilitation Admission Medication Review by a Pharmacist  A complete drug regimen review was completed for this patient to identify any potential clinically significant medication issues.  High Risk Drug Classes Is patient taking? Indication by Medication  Antipsychotic Yes Compazine - prn N/V  Anticoagulant Yes Sq heparin - VTE ppx  Antibiotic No   Opioid No   Antiplatelet Yes Clopidogrel for 3 weeks, aspirin  Hypoglycemics/insulin No   Vasoactive Medication Yes Amlodipine - BP Furosemide - Fluid  Chemotherapy No   Other Yes Rosuvastatin - HLD Calcitriol - supplement Prednisone - gout flare     Type of Medication Issue Identified Description of Issue Recommendation(s)  Drug Interaction(s) (clinically significant)     Duplicate Therapy     Allergy     No Medication Administration End Date     Incorrect Dose     Additional Drug Therapy Needed     Significant med changes from prior encounter (inform family/care partners about these prior to discharge).    Other       Clinically significant medication issues were identified that warrant physician communication and completion of prescribed/recommended actions by midnight of the next day:  No  Pharmacist comments: None   Time spent performing this drug regimen review (minutes): 20 minutes  Thank you Anette Guarneri, PharmD 04/22/2022 3:47 PM

## 2022-04-22 NOTE — Progress Notes (Signed)
Inpatient Rehab Admissions Coordinator:    I have a CIR bed for this pt. Today. RN may call report to [phone].  Graysin Luczynski, MS, CCC-SLP Rehab Admissions Coordinator  [phone] (celll) [phone] (office)  

## 2022-04-22 NOTE — Plan of Care (Signed)
  Problem: Clinical Measurements: Goal: Respiratory complications will improve Outcome: Progressing Goal: Cardiovascular complication will be avoided Outcome: Progressing   Problem: Activity: Goal: Risk for activity intolerance will decrease Outcome: Progressing   Problem: Coping: Goal: Level of anxiety will decrease Outcome: Progressing   

## 2022-04-22 NOTE — Progress Notes (Signed)
PMR Admission Coordinator Pre-Admission Assessment   Patient: Gina Bailey is an 76 y.o., female MRN: [id number] DOB: April 07, 1946 Height: 5' 5" (165.1 cm) Weight: 79.2 kg   Insurance Information HMO:     PPO: no     PCP:      IPA:      80/20:      OTHER:  PRIMARY: [employer]      Policy#: [id number]      Subscriber: patient CM Name: Jeanella Craze       Phone#: [phone]     Fax#: [phone]  Eulogio Ditch with [employer] called granting approval 10/3 for 7 days with update due 78/9 Pre-Cert#: [id number]      Employer:  Benefits:  Phone #: n/a-[email or web address] at [email or web address]     Name:  Eff. Date: 07/21/19-still active     Deduct: does not have      Out of Pocket Max: $4,000 ($704.06 met)      Life Max: AN CIR: $160/day co-pay with a max co-pay of $1,600/admission       SNF: 100% coverage Outpatient: $20/visit co-pay     Co-Pay:  Home Health: 100% coverage      Co-Pay:  DME: 80% coverage     Co-Pay: 20% co-insurance Providers: in network  SECONDARY:       Policy#:      Phone#:    Development worker, community:       Phone#:    The Engineer, petroleum" for patients in Inpatient Rehabilitation Facilities with attached "Privacy Act [place] Records" was provided and verbally reviewed with: Patient and Family   Emergency Contact Information Contact Information       Name Relation Home Work Mobile    [place] Son     Liborio Negron Torres Daughter [phone]           Current Medical History  Patient Admitting Diagnosis: L CVA   History of Present Illness: A 76 y.o. female who presents with new stroke.  When asked what made her think she was having a stroke, she indicates that her children told her that she may be having one.  She had not noticed anything in particular.  Her family notes that she had significant facial weakness and slurred speech and therefore brought her in to be evaluated.  In the emergency department  an MRI was obtained which demonstrated an acute infarct in the right posterior limb of the internal capsule and posteroinferior right lentiform nucleus.  An additional acute infarct in the occipital lobe periventricular white matter posterior to the left occipital horn was also noted.  PT/OT/SLP evaluations were completed with recommendations for acute inpatient rehab admission.   Complete NIHSS TOTAL: 2   Patient's medical record from [facility] has been reviewed by the rehabilitation admission coordinator and physician.   Past Medical History      Past Medical History:  Diagnosis Date   Chronic kidney disease     Diabetes mellitus      Diet-controlled. Not on antiglycemic medications.   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  Based on the size, patient's age, status as a former smoker, and  its discovery in a community setting, her probability of malignancy is 3% (low probability).  Based on this, I recommend follow-up with a CT scan around 05/2014, and if lesion is stable or smaller, no further follow-up is needed.  CT Chest 06/2015: Stable 2m pulmonary nodule. No   Ventral hernia      Incisional ventral hernia. S/P repair by Dr. 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   Villous adenoma of colon      Hx of. S/P resection and partial colectomy with anastomosis by Dr. WHulen Skains (08/2006)      Has the patient had major surgery during 100 days prior to admission? Yes   Family History   family history includes Breast cancer in her mother; Diabetes in an other family member.   Current Medications   Current Facility-Administered Medications:    acetaminophen (TYLENOL) tablet 650 mg, 650 mg, Oral,  Q4H PRN **OR** acetaminophen (TYLENOL) 160 MG/5ML solution 650 mg, 650 mg, Per Tube, Q4H PRN **OR** acetaminophen (TYLENOL) suppository 650 mg, 650 mg, Rectal, Q4H PRN, ACoy Saunas PCharisse March MD   amLODipine (NORVASC) tablet 10 mg, 10 mg, Oral, Daily, Banci, Daniela, MD, 10 mg at 04/18/22 1008   aspirin EC tablet 81 mg, 81 mg, Oral, Daily, KGreta Doom MD, 81 mg at 04/18/22 1008   calcitRIOL (ROCALTROL) capsule 0.25 mcg, 0.25 mcg, Oral, Once per day on Mon Wed Fri, Amponsah, Prosper M, MD, 0.25 mcg at 04/17/22 0939   carbamide peroxide (DEBROX) 6.5 % OTIC (EAR) solution 5 drop, 5 drop, Both EARS, BID, NGaylan Gerold DO, 5 drop at 04/18/22 1009   carvedilol (COREG) tablet 6.25 mg, 6.25 mg, Oral, BID WC, Banci, Daniela, MD, 6.25 mg at 04/18/22 0900   [COMPLETED] clopidogrel (PLAVIX) tablet 300 mg, 300 mg, Oral, Once, 300 mg at 04/16/22 0547 **AND** clopidogrel (PLAVIX) tablet 75 mg, 75 mg, Oral, Daily, Banci, Daniela, MD, 75 mg at 04/18/22 1008   ferric gluconate (FERRLECIT) 250 mg in sodium chloride 0.9 % 250 mL IVPB, 250 mg, Intravenous, Daily, Banci, DOrma Render MD, Last Rate: 135 mL/hr at 04/18/22 1019, 250 mg at 04/18/22 1019   furosemide (LASIX) tablet 80 mg, 80 mg, Oral, Daily, ALacinda Axon MD, 80 mg at 04/18/22 1008   heparin injection 5,000 Units, 5,000 Units, Subcutaneous, Q8H, ALacinda Axon MD, 5,000 Units at 04/18/22 0539   rosuvastatin (CRESTOR) tablet 20 mg, 20 mg, Oral, Daily, Shafer, Devon, NP, 20 mg at 04/18/22 1008   senna-docusate (Senokot-S) tablet 1 tablet, 1 tablet, Oral, QHS PRN, ALacinda Axon MD   Patients Current Diet:  Diet Order                  Diet Heart Room service appropriate? Yes; Fluid consistency: Thin  Diet effective now                         Precautions / Restrictions Precautions Precautions: Fall Restrictions Weight Bearing Restrictions: No    Has the patient had 2 or more falls or a fall with injury in the past year?  Yes   Prior Activity Level Limited Community (1-2x/wk): Went out 2-3 times a week.  Not driving.   Prior Functional Level Self Care: Did the patient need help bathing, dressing, using the toilet or eating? Independent   Indoor Mobility: Did the patient need assistance with walking from room to room (with or without  device)? Independent   Stairs: Did the patient need assistance with internal or external stairs (with or without device)? Independent   Functional Cognition: Did the patient need help planning regular tasks such as shopping or remembering to take medications? Needed some help   Patient Information Are you of Hispanic, Latino/a,or Spanish origin?: A. No, not of Hispanic, Latino/a, or Spanish origin What is your race?: B. Black or African American Do you need or want an interpreter to communicate with a doctor or health care staff?: 0. No   Patient's Response To:  Health Literacy and Transportation Is the patient able to respond to health literacy and transportation needs?: Yes Health Literacy - How often do you need to have someone help you when you read instructions, pamphlets, or other written material from your doctor or pharmacy?: Rarely In the past 12 months, has lack of transportation kept you from medical appointments or from getting medications?: No In the past 12 months, has lack of transportation kept you from meetings, work, or from getting things needed for daily living?: No   Development worker, international aid / Equipment Home Equipment: None   Prior Device Use: Indicate devices/aids used by the patient prior to current illness, exacerbation or injury? None of the above   Current Functional Level Cognition   Arousal/Alertness: Awake/alert Overall Cognitive Status: Impaired/Different from baseline Orientation Level: Oriented to person, Oriented to place Following Commands: Follows multi-step commands with increased time Safety/Judgement: Decreased awareness of safety,  Decreased awareness of deficits General Comments: DIL reports pt has slower processing since CVA Attention: Focused, Sustained, Selective Focused Attention: Appears intact Sustained Attention: Appears intact Selective Attention: Impaired Selective Attention Impairment: Verbal complex Memory: Impaired Memory Impairment: Decreased short term memory, Decreased recall of new information (Immediate: 5/5; delayed: 0/5; with cues: 3/5) Awareness: Impaired Awareness Impairment: Intellectual impairment Problem Solving: Impaired Problem Solving Impairment: Verbal complex (money: 0/3; time: 0/1) Executive Function: Sequencing, Technical brewer: Impaired Sequencing Impairment: Verbal complex (clock: 2/4; verbal sequencing: multiple steps missing) Organizing: Impaired Organizing Impairment: Verbal complex (backward digit span: 1/2)    Extremity Assessment (includes Sensation/Coordination)   Upper Extremity Assessment: Defer to OT evaluation LUE Deficits / Details: Slightly impaired overhead AROM. Strength similar to RUE. Slight coordination deficits LUE Sensation: WNL LUE Coordination: decreased fine motor, decreased gross motor  Lower Extremity Assessment: LLE deficits/detail RLE Deficits / Details: WFL LLE Deficits / Details: AROM WFL, strength 4/5 throughout     ADLs   Overall ADL's : Needs assistance/impaired Eating/Feeding: Supervision/ safety Grooming: Supervision/safety Upper Body Bathing: Minimal assistance Lower Body Bathing: Moderate assistance, Sit to/from stand Upper Body Dressing : Minimal assistance, Sitting Lower Body Dressing: Moderate assistance, Sit to/from stand Toilet Transfer: Moderate assistance, Ambulation Toileting- Clothing Manipulation and Hygiene: Moderate assistance, Sit to/from stand Functional mobility during ADLs: Moderate assistance, Cueing for safety, Cueing for sequencing General ADL Comments: Mod A for LB ADLS, (S)-min A for UB, cueing for technique      Mobility   Overal bed mobility: Needs Assistance Bed Mobility: Supine to Sit Supine to sit: Min assist General bed mobility comments: increased time, heavy use of rail. assist to scoot hips to EOB     Transfers   Overall transfer level: Needs assistance Equipment used: 1 person hand held assist, Rolling walker (2 wheels) Transfers: Sit to/from Stand Sit to Stand: Min assist Bed to/from chair/wheelchair/BSC transfer type:: Stand pivot Stand pivot transfers: Mod assist General transfer comment: stood with A for balance, pt reaching for UE support  Ambulation / Gait / Stairs / Wheelchair Mobility   Ambulation/Gait Ambulation/Gait assistance: Herbalist (Feet): 90 Feet (&15') Assistive device: Rolling walker (2 wheels) Gait Pattern/deviations: Step-to pattern, Decreased step length - left, Drifts right/left, Trunk flexed General Gait Details: leaning L and reports L side heavy, turns both ways in hallway with increased time, cues for technique, assist for balance with L lateral lean     Posture / Balance Dynamic Sitting Balance Sitting balance - Comments: leaning on toilet for hygiene after BM Balance Overall balance assessment: Needs assistance, History of Falls Sitting-balance support: Feet supported Sitting balance-Leahy Scale: Good Sitting balance - Comments: leaning on toilet for hygiene after BM Standing balance support: During functional activity, Reliant on assistive device for balance Standing balance-Leahy Scale: Poor Standing balance comment: UE support leaning on elbows for balance washing hands, stood without support with wide BOS and CGA     Special needs/care consideration Skin Has a graft in left upper arm to dialysis but not currently on dialysis, Diabetic management yes h/o DM, and Bladder incontinence    Previous Home Environment (from acute therapy documentation) Living Arrangements: Children  Lives With: Son Available Help at Discharge:  Family Type of Home: House Home Layout: One level Home Access: Stairs to enter [employer] of Steps: 1 step up onto porch Bathroom Shower/Tub: Chiropodist: Standard Additional Comments: DIL was present initially and reported family could likely make 24/7 supervision work   Discharge Living Setting Plans for Discharge Living Setting: Patient's home, House, Lives with (comment) (Is a widow.  Lives with her son.) Type of Home at Discharge: House Discharge Home Layout: One level Discharge Home Access: Stairs to enter Entrance Stairs-Rails: None Entrance Stairs-Number of Steps: 1 step up onto porch Discharge Bathroom Shower/Tub: Tub/shower unit, Curtain Discharge Bathroom Toilet: Standard Discharge Bathroom Accessibility: Yes How Accessible: Accessible via walker   Social/Family/Support Systems Patient Roles: Parent, Other (Comment) (Has a son, daughter, dtr-in-law) Contact Information: Luceal Hollibaugh - son - [phone] Anticipated Caregiver: Son, Dtr and dtr-in-law Ability/Limitations of Caregiver: Son works on weekends and dtr-in-law works at [employer] as an Therapist, sports during the week. Caregiver Availability: 24/7 (Likely family can work out 24/7 supervision.) Discharge Plan Discussed with Primary Caregiver: Yes Is Caregiver In Agreement with Plan?: Yes Does Caregiver/Family have Issues with Lodging/Transportation while Pt is in Rehab?: No   Goals Patient/Family Goal for Rehab: PT/OT/SLP supervision goals Expected length of stay: 7-10 days Pt/Family Agrees to Admission and willing to participate: Yes Program Orientation Provided & Reviewed with Pt/Caregiver Including Roles  & Responsibilities: Yes   Decrease burden of Care through IP rehab admission: N/A   Possible need for SNF placement upon discharge: Not anticipated   Patient Condition: I have reviewed medical records from [facility] health, spoken with CM, and patient and family member. I met with patient  at the bedside for inpatient rehabilitation assessment.  Patient will benefit from ongoing PT, OT, and SLP, can actively participate in 3 hours of therapy a day 5 days of the week, and can make measurable gains during the admission.  Patient will also benefit from the coordinated team approach during an Inpatient Acute Rehabilitation admission.  The patient will receive intensive therapy as well as Rehabilitation physician, nursing, social worker, and care management interventions.  Due to bladder management, bowel management, safety, skin/wound care, disease management, medication administration, pain management, and patient education the patient requires 24 hour a day rehabilitation nursing.  The patient is currently Min A  with mobility and basic ADLs.  Discharge setting and therapy post discharge at home with home health is anticipated.  Patient has agreed to participate in the Acute Inpatient Rehabilitation Program and will admit today.   Preadmission Screen Completed By:  Retta Diones, 04/18/2022 12:29 PM with updated by Clemens Catholic, MS, CCC-SLP   ______________________________________________________________________   Discussed status with Dr. Curlene Dolphin  on 04/22/22 at 44 and received approval for admission today.   Admission Coordinator:  Retta Diones, RN, time 1000/Date 04/22/2022    Assessment/Plan: Diagnosis: CVA Does the need for close, 24 hr/day Medical supervision in concert with the patient's rehab needs make it unreasonable for this patient to be served in a less intensive setting? Yes Co-Morbidities requiring supervision/potential complications: CKD, anemia, HLD, HTN, DM Due to bladder management, bowel management, safety, skin/wound care, disease management, medication administration, pain management, and patient education, does the patient require 24 hr/day rehab nursing? Yes Does the patient require coordinated care of a physician, rehab nurse, PT, OT, and SLP to address  physical and functional deficits in the context of the above medical diagnosis(es)? Yes Addressing deficits in the following areas: balance, endurance, locomotion, strength, transferring, bowel/bladder control, bathing, dressing, feeding, grooming, toileting, cognition, speech, language, swallowing, and psychosocial support Can the patient actively participate in an intensive therapy program of at least 3 hrs of therapy 5 days a week? Yes The potential for patient to make measurable gains while on inpatient rehab is excellent Anticipated functional outcomes upon discharge from inpatient rehab: supervision PT, supervision OT, supervision SLP Estimated rehab length of stay to reach the above functional goals is: 7-10 Anticipated discharge destination: Home 10. Overall Rehab/Functional Prognosis: excellent     MD Signature: Jennye Boroughs

## 2022-04-22 NOTE — Progress Notes (Signed)
Patient arrieved via bed from 3 W. Patient is A&O x 3 and able to make her needs known. Skin CDI, lungs clear to ascultation, ABD soft non distended. Positive pedal pulses cap refil less than 3 seconds and warm to the touch. Patients left great toe is pink blanchable . Patient states she has gout. Able to use the call light, dinner called in. Spoke to son [name], son aware of room change.

## 2022-04-23 DIAGNOSIS — I639 Cerebral infarction, unspecified: Secondary | ICD-10-CM | POA: Diagnosis not present

## 2022-04-23 DIAGNOSIS — I10 Essential (primary) hypertension: Secondary | ICD-10-CM

## 2022-04-23 DIAGNOSIS — D631 Anemia in chronic kidney disease: Secondary | ICD-10-CM

## 2022-04-23 DIAGNOSIS — N185 Chronic kidney disease, stage 5: Secondary | ICD-10-CM | POA: Diagnosis not present

## 2022-04-23 LAB — CBC WITH DIFFERENTIAL/PLATELET
Abs Immature Granulocytes: 0.03 10*3/uL (ref 0.00–0.07)
Basophils Absolute: 0 10*3/uL (ref 0.0–0.1)
Basophils Relative: 0 %
Eosinophils Absolute: 0 10*3/uL (ref 0.0–0.5)
Eosinophils Relative: 0 %
HCT: 25.8 % — ABNORMAL LOW (ref 36.0–46.0)
Hemoglobin: 8.1 g/dL — ABNORMAL LOW (ref 12.0–15.0)
Immature Granulocytes: 0 %
Lymphocytes Relative: 9 %
Lymphs Abs: 0.8 10*3/uL (ref 0.7–4.0)
MCH: 26.2 pg (ref 26.0–34.0)
MCHC: 31.4 g/dL (ref 30.0–36.0)
MCV: 83.5 fL (ref 80.0–100.0)
Monocytes Absolute: 0.3 10*3/uL (ref 0.1–1.0)
Monocytes Relative: 3 %
Neutro Abs: 7.5 10*3/uL (ref 1.7–7.7)
Neutrophils Relative %: 88 %
Platelets: 299 10*3/uL (ref 150–400)
RBC: 3.09 MIL/uL — ABNORMAL LOW (ref 3.87–5.11)
RDW: 16.3 % — ABNORMAL HIGH (ref 11.5–15.5)
WBC: 8.6 10*3/uL (ref 4.0–10.5)
nRBC: 0 % (ref 0.0–0.2)

## 2022-04-23 LAB — COMPREHENSIVE METABOLIC PANEL
ALT: 37 U/L (ref 0–44)
AST: 27 U/L (ref 15–41)
Albumin: 2.8 g/dL — ABNORMAL LOW (ref 3.5–5.0)
Alkaline Phosphatase: 48 U/L (ref 38–126)
Anion gap: 12 (ref 5–15)
BUN: 81 mg/dL — ABNORMAL HIGH (ref 8–23)
CO2: 23 mmol/L (ref 22–32)
Calcium: 8.9 mg/dL (ref 8.9–10.3)
Chloride: 103 mmol/L (ref 98–111)
Creatinine, Ser: 5.77 mg/dL — ABNORMAL HIGH (ref 0.44–1.00)
GFR, Estimated: 7 mL/min — ABNORMAL LOW (ref >60)
Glucose, Bld: 161 mg/dL — ABNORMAL HIGH (ref 70–99)
Potassium: 5 mmol/L (ref 3.5–5.1)
Sodium: 138 mmol/L (ref 135–145)
Total Bilirubin: 0.3 mg/dL (ref 0.3–1.2)
Total Protein: 6.3 g/dL — ABNORMAL LOW (ref 6.5–8.1)

## 2022-04-23 LAB — MAGNESIUM: Magnesium: 2.8 mg/dL — ABNORMAL HIGH (ref 1.7–2.4)

## 2022-04-23 LAB — PHOSPHORUS: Phosphorus: 5.3 mg/dL — ABNORMAL HIGH (ref 2.5–4.6)

## 2022-04-23 NOTE — Progress Notes (Signed)
PROGRESS NOTE   Subjective/Complaints: No new concerns or complaints this AM. She is sitting in bed. She says she just had breakfast.   Review of Systems  Constitutional:  Negative for fever.  Eyes:  Negative for double vision.  Respiratory:  Negative for cough.   Cardiovascular:  Negative for chest pain.  Gastrointestinal:  Negative for abdominal pain, diarrhea, heartburn, nausea and vomiting.  Genitourinary: Negative.   Musculoskeletal: Negative.   Neurological:  Positive for weakness.     Objective:   No results found. Recent Labs    04/22/22 1532 04/23/22 0512  WBC 7.7 8.6  HGB 8.0* 8.1*  HCT 25.9* 25.8*  PLT 313 299   Recent Labs    04/23/22 0512  NA 138  K 5.0  CL 103  CO2 23  GLUCOSE 161*  BUN 81*  CREATININE 5.77*  CALCIUM 8.9    Intake/Output Summary (Last 24 hours) at 04/23/2022 0911 Last data filed at 04/23/2022 0814 Gross per 24 hour  Intake 357 ml  Output --  Net 357 ml        Physical Exam: Vital Signs Blood pressure (!) 144/76, pulse 75, temperature 98.7 F (37.1 C), resp. rate 18, height '5\' 6"'$  (1.676 m), weight 77 kg, last menstrual period 10/22/1978, SpO2 99 %.  General: No apparent distress HEENT: PERRLA, conjugate gaze, sclera anicteric, oral mucosa pink and moist,   Neck: Supple without JVD or lymphadenopathy Heart: Reg rate and rhythm. No murmurs rubs or gallops Chest: CTA bilaterally without wheezes, rales, or rhonchi; no distress Abdomen: Soft, non-tender, non-distended, bowel sounds positive. Extremities: No clubbing, cyanosis, Pulses are 2+ Psych: Pt's affect is flat Skin: Clean and intact without signs of breakdown Neuro:  Alert to person and date only, not able to say what brought her to the hospital, decreased memory, follows simple commands, CN 2-12 intact orther than L facial weakness, L inattention, she has dysarthria Sensation intact to LT in all 4  extremities Strength 5/5 in b/l UE and RLE, strength at least 4-/5 in proximal LLE and at least 2/5 in distal LLE however she provides limited effort due to pain FTN intact b/l Musculoskeletal:  Trace edema, left GT, 1st metatarsal head TTP   Assessment/Plan: 1. Functional deficits which require 3+ hours per day of interdisciplinary therapy in a comprehensive inpatient rehab setting. Physiatrist is providing close team supervision and 24 hour management of active medical problems listed below. Physiatrist and rehab team continue to assess barriers to discharge/monitor patient progress toward functional and medical goals  Care Tool:  Bathing              Bathing assist       Upper Body Dressing/Undressing Upper body dressing        Upper body assist      Lower Body Dressing/Undressing Lower body dressing            Lower body assist       Toileting Toileting    Toileting assist       Transfers Chair/bed transfer  Transfers assist           Locomotion Ambulation   Ambulation assist  Walk 10 feet activity   Assist           Walk 50 feet activity   Assist           Walk 150 feet activity   Assist           Walk 10 feet on uneven surface  activity   Assist           Wheelchair     Assist               Wheelchair 50 feet with 2 turns activity    Assist            Wheelchair 150 feet activity     Assist          Blood pressure (!) 144/76, pulse 75, temperature 98.7 F (37.1 C), resp. rate 18, height '5\' 6"'$  (1.676 m), weight 77 kg, last menstrual period 10/22/1978, SpO2 99 %.  Medical Problem List and Plan: 1. 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   Antithrombotics: -DVT/anticoagulation:  Pharmaceutical: Heparin             -antiplatelet therapy: aspirin 81 mg and Plavix for 3 weeks then aspirin alone 3. Pain Management: Tylenol as needed 4. Mood/Behavior/Sleep: LCSW to evaluate and provide emotional support             -antipsychotic agents: none 5. Neuropsych/cognition: This patient does capable of making decisions on her own behalf. 6. Skin/Wound Care: Routine skin care checks 7. 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 Glucose stable on last BMP 10: Chronic BLE edema: minimal currently -continue Lasix 80 mg q AM (40 mg q PM discontinued)             -continue compression stockings when OOB 11: Hypertension: monitor             -continue amlodipine 10 mg daily             -continue carvedilol 12.5 mg BID             -continue Lasix  -10/5 Fair control, continue to monitor 12: Diabetic neuropathy both feet: follows with podiatry 13: Hyperlipidemia: Crestor 20 mg daily 14: Anemia of chronic renal failure: received 4 transfusion of IV Ferrlecit; follow-up CBC  -10/5 HGB stable at 8/1 15: Gout flare: prednisone for 3 days 16: Obesity: dietary counseling 17: Bilateral carotid artery stenosis: outpatient surveillance 18. Dysphagia             -SLP eval, dys 3 diet with thin liquids 19. Hypermagnesemia  -Mild asymptomatic, continue to monitor, avoid Mg supplements    LOS: 1 days A  FACE TO FACE EVALUATION WAS PERFORMED  Jennye Boroughs 04/23/2022, 9:11 AM

## 2022-04-23 NOTE — Evaluation (Signed)
Speech Language Pathology Assessment and Plan  Patient Details  Name: Gina Bailey MRN: [id number] Date of Birth: [birth year]  SLP Diagnosis: Speech and Language deficits;Dysarthria;Dysphagia  Rehab Potential: Good ELOS: 10-14 days   Today's Date: 04/23/2022 SLP Individual Time: 3009-2330 SLP Individual Time Calculation (min): 60 min  Hospital Problem: Principal Problem:   Acute ischemic stroke [facility]) Active Problems:   Dysphagia   Anemia  Past Medical History:  Past Medical History:  Diagnosis Date   Chronic kidney disease    Diabetes mellitus    Diet-controlled. Not on antiglycemic medications.   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  Based on the size, patient's age, status as a former smoker, and its discovery in a community setting, her probability of malignancy is 3% (low probability).  Based on this, I recommend follow-up with a CT scan around 05/2014, and if lesion is stable or smaller, no further follow-up is needed.  CT Chest 06/2015: Stable 47m pulmonary nodule. No   Ventral hernia    Incisional ventral hernia. S/P repair by Dr. 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 WHulen Skains (08/2006)   Past Surgical History:  Past Surgical History:  Procedure Laterality Date   APPENDECTOMY     AV FISTULA PLACEMENT Left 03/10/2022   Procedure: LEFT ARM ARTERIOVENOUS (AV) FISTULA CREATION VERSUS GRAFT;  Surgeon: CWaynetta Sandy MD;  [facility]  Service: Vascular;  Laterality: Left;   COLON SURGERY  08/2006   Partial right colectomy and anastomosis with resection of villous adenoma.    HEMORRHOID SURGERY     hemorrhoidectomy   TONSILLECTOMY     TUBAL LIGATION     VENTRAL HERNIA REPAIR  08/2008   By Dr. WHulen Skains    Assessment / Plan / Recommendation Clinical Impression JAi Sonnenfeldis a 76year old female [facility] complaining of left facial droop, slurred speech. Neurology consulted.  MRI with findings of right posterior limb internal capsule as well as left tiny periventricular white matter lacunar infarcts.  MR angiogram of the brain and neck is suboptimal but showed no large vessel stenosis or occlusion.  Etiology likely secondary to chronic small vessel disease. Her neurologic deficits have improved.  She is tolerating heart healthy/D3 diet and thin liquids. The patient requires inpatient physical medicine and rehabilitation evaluations and treatment secondary to dysfunction due to right internal capsule infarct.  SLP consulted to complete speech-language-cognitive evaluation and clinical swallow evaluation (CSE) in the setting of recent acute ischemic stroke. Pt presents with a mild dysarthria as evidenced by reduced articulatory precision impacting speech intelligibility at the conversation level. OME remarkable for left facial asymmetry and weakness, as well as reduced lingual ROM and strength. BSE was remarkable for mild left buccal stasis with consumption of regular solids. Pt consumed both solid textures and thin liquids via straw without overt s/sx of aspiration. SLP recommends a dysphagia 3 diet with thin liquids, whole pills in applesauce, or crushed as needed. Swallowing compensatory strategies include small bites/sips, slow rate, monitor for  pocketing on left. Recommend set-up and intermittent supervision during meals. Please provide oral care following each meal per family request. Pt's daughter-in-law was present during evaluation and supports pt is at cognitive baseline with ongoing deficits in memory and attention. Daughter-in-law  did not feel as though pt has exhibited any acute cognitive deficits from this CVA. Pt's family reported that the pt's declining cognition has been discussed with the pt's doctor and that there are plans for  further cognitive evaluation with pt's doctor this fall. SLP is in agreement with this plan to determine etiology of pt's cognitive decline. Skilled ST intervention is recommended to address acute speech and swallow deficits to maximize functional independence. Pt and family verbalized understanding and agreement with plan.    Skilled Therapeutic Interventions          CSE and informal assessment measures administered. Please see report for full details.   SLP Assessment  Patient will need skilled Speech Lanaguage Pathology Services during CIR admission    Recommendations  SLP Diet Recommendations: Dysphagia 3 (Mech soft);Thin Liquid Administration via: Cup;Straw Medication Administration: Whole meds with puree (or crushed as needed) Supervision: Intermittent supervision to cue for compensatory strategies;Patient able to self feed Compensations: Minimize environmental distractions;Slow rate;Small sips/bites;Lingual sweep for clearance of pocketing Postural Changes and/or Swallow Maneuvers: Seated upright 90 degrees Oral Care Recommendations:  (oral care after each meal) Patient destination: Home Follow up Recommendations: 24 hour supervision/assistance Equipment Recommended: None recommended by SLP    SLP Frequency 1 to 3 out of 7 days   SLP Duration  SLP Intensity  SLP Treatment/Interventions 10-14 days  Minumum of 1-2 x/day, 30 to 90 minutes  Dysphagia/aspiration precaution training;Functional tasks;Patient/family education;Speech/Language facilitation;Therapeutic Activities    Pain Pain Assessment Pain Scale: 0-10 Pain Score: 0-No pain  Prior Functioning Cognitive/Linguistic Baseline: Baseline deficits Baseline deficit details: memory, attention, topic maintenance Type  of Home: House  Lives With: Son Available Help at Discharge: Family Education: some college Vocation: Retired  Programmer, systems Overall Cognitive Status: History of cognitive impairments - at baseline (Per acute care SLP eval report and discussion with pt's daughter-in-law, family feel pt is at cognitive baseline) Arousal/Alertness: Awake/alert Orientation Level: Disoriented to time;Oriented to person;Oriented to place;Oriented to situation Year: 2023 Month: September Day of Week: Incorrect (Correct when given field of 3 choices) Attention: Focused;Sustained;Selective Focused Attention: Appears intact Sustained Attention: Impaired Sustained Attention Impairment: Verbal basic Selective Attention: Impaired Selective Attention Impairment: Verbal basic Memory: Impaired Memory Impairment: Decreased short term memory;Decreased recall of new information Awareness: Impaired Awareness Impairment: Intellectual impairment  Safety/Judgement: impaired Comprehension Auditory Comprehension Overall Auditory Comprehension: Appears within functional limits for tasks assessed Interfering Components: Attention;Working memory;Processing speed Expression Expression Primary Mode of Expression: Verbal Verbal Expression Overall Verbal Expression: Appears within functional limits for tasks assessed Oral Motor Oral Motor/Sensory Function Overall Oral Motor/Sensory Function: Mild impairment Facial ROM: Reduced left;Suspected CN VII (facial) dysfunction Facial Symmetry: Abnormal symmetry left;Suspected CN VII (facial) dysfunction Facial Strength: Reduced left;Suspected CN VII (facial) dysfunction Lingual ROM: Reduced left;Reduced right;Suspected CN XII (hypoglossal) dysfunction Lingual Symmetry: Within Functional Limits Lingual Strength: Reduced;Suspected CN XII (hypoglossal) dysfunction Mandible: Within Functional Limits Motor Speech Overall Motor Speech: Impaired Respiration: Within  functional limits Phonation: Normal Resonance: Within functional limits Articulation: Impaired Level of Impairment: Conversation Intelligibility: Intelligibility reduced Word: 75-100% accurate Phrase: 75-100% accurate Sentence: 75-100% accurate Conversation: 75-100% accurate Motor Planning: Witnin functional limits Motor Speech Errors: Aware Effective Techniques: Slow rate  Care Tool Care Tool Cognition Ability to hear (with  hearing aid or hearing appliances if normally used Ability to hear (with hearing aid or hearing appliances if normally used): 1. Minimal difficulty - difficulty in some environments (e.g. when person speaks softly or setting is noisy)   Expression of Ideas and Wants Expression of Ideas and Wants: 3. Some difficulty - exhibits some difficulty with expressing needs and ideas (e.g, some words or finishing thoughts) or speech is not clear   Understanding Verbal and Non-Verbal Content Understanding Verbal and Non-Verbal Content: 3. Usually understands - understands most conversations, but misses some part/intent of message. Requires cues at times to understand  Memory/Recall Ability Memory/Recall Ability : Current season;That he or she is in a hospital/hospital unit   PMSV Assessment  PMSV Trial Intelligibility: Intelligibility reduced Word: 75-100% accurate Phrase: 75-100% accurate Sentence: 75-100% accurate Conversation: 75-100% accurate  Bedside Swallowing Assessment General Date of Onset: 04/15/22 Diet Prior to this Study: Dysphagia 3 (soft);Thin liquids Temperature Spikes Noted: No Respiratory Status: Room air History of Recent Intubation: No Behavior/Cognition: Alert;Cooperative;Pleasant mood Oral Cavity - Dentition: Poor condition;Missing dentition Self-Feeding Abilities: Able to feed self;Needs set up Patient Positioning: Upright in chair/Tumbleform Baseline Vocal Quality: Normal Volitional Cough: Strong Volitional Swallow: Able to elicit  Oral  Care Assessment Oral Assessment  (WDL): Exceptions to WDL Lips: Asymmetrical Teeth: Missing (Comment);Poor dental hygiene Tongue: Pink;Moist Mucous Membrane(s): Moist;Pink Saliva: Moist, saliva free flowing Level of Consciousness: Alert Is patient on any of following O2 devices?: None of the above Nutritional status: Dysphagia Oral Assessment Risk : High Risk Ice Chips Ice chips: Not tested Thin Liquid Thin Liquid: Within functional limits Nectar Thick Nectar Thick Liquid: Not tested Honey Thick Honey Thick Liquid: Not tested Puree Puree: Within functional limits Solid Solid: Impaired Oral Phase Functional Implications: Left lateral sulci pocketing BSE Assessment Risk for Aspiration Impact on safety and function: Mild aspiration risk Other Related Risk Factors: Cognitive impairment  Short Term Goals: Week 1: SLP Short Term Goal 1 (Week 1): Patient will consume current diet without overt s/sx of aspiration and self monitoring of oral residuals and/orL  buccal stasis with sup A verbal cues SLP Short Term Goal 2 (Week 1): Patient will implement speech intelligiblity strategies at the conversation level with sup A verbal cues to achieve >90% intelligibility  Refer to Care Plan for Long Term Goals  Recommendations for other services: None   Discharge Criteria: Patient will be discharged from SLP if patient refuses treatment 3 consecutive times without medical reason, if treatment goals not met, if there is a change in medical status, if patient makes no progress towards goals or if patient is discharged from hospital.  The above assessment, treatment plan, treatment alternatives and goals were discussed and mutually agreed upon: by patient and by family  Patty Sermons 04/23/2022, 5:32 PM

## 2022-04-23 NOTE — Progress Notes (Signed)
[facility] Individual Statement of Services  Patient Name:  Gina Bailey  Date:  04/23/2022  Welcome to the [facility].  Our goal is to provide you with an individualized program based on your diagnosis and situation, designed to meet your specific needs.  With this comprehensive rehabilitation program, you will be expected to participate in at least 3 hours of rehabilitation therapies Monday-Friday, with modified therapy programming on the weekends.  Your rehabilitation program will include the following services:  Physical Therapy (PT), Occupational Therapy (OT), Speech Therapy (ST), 24 hour per day rehabilitation nursing, Therapeutic Recreaction (TR), Neuropsychology, Care Coordinator, Rehabilitation Medicine, Nutrition Services, Pharmacy Services, and Other  Weekly team conferences will be held on Wednesdays to discuss your progress.  Your Inpatient Rehabilitation Care Coordinator will talk with you frequently to get your input and to update you on team discussions.  Team conferences with you and your family in attendance may also be held.  Expected length of stay:  7-10 Days  Overall anticipated outcome:  Supervision  Depending on your progress and recovery, your program may change. Your Inpatient Rehabilitation Care Coordinator will coordinate services and will keep you informed of any changes. Your Inpatient Rehabilitation Care Coordinator's name and contact numbers are listed  below.  The following services may also be recommended but are not provided by the [facility]:   [facility] will be made to provide these services after discharge if needed.  Arrangements include referral to agencies that provide these services.  Your insurance has been verified to be:   [employer] Your primary doctor is:  Angelica Pou, MD  Pertinent  information will be shared with your doctor and your insurance company.  Inpatient Rehabilitation Care Coordinator:  Erlene Quan, [place] or [phone]  Information discussed with and copy given to patient by: Dyanne Iha, 04/23/2022, 10:38 AM

## 2022-04-23 NOTE — Evaluation (Signed)
Physical Therapy Assessment and Plan  Patient Details  Name: Gina Bailey MRN: [id number] Date of Birth: 1945/11/12  PT Diagnosis: Abnormal posture, Abnormality of gait, Difficulty walking, Hemiparesis non-dominant, Impaired cognition, and Muscle weakness Rehab Potential: Good ELOS: 2 weeks.   Today's Date: 04/23/2022 PT Individual Time: 1330-1445 PT Individual Time Calculation (min): 75 min    Hospital Problem: Principal Problem:   Acute ischemic stroke [facility]) Active Problems:   Dysphagia   Anemia   Past Medical History:  Past Medical History:  Diagnosis Date   Chronic kidney disease    Diabetes mellitus    Diet-controlled. Not on antiglycemic medications.   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  Based on the size, patient's age, status as a former smoker, and its discovery in a community setting, her probability of malignancy is 3% (low probability).  Based on this, I recommend follow-up with a CT scan around 05/2014, and if lesion is stable or smaller, no further follow-up is needed.  CT Chest 06/2015: Stable 67m pulmonary nodule. No   Ventral hernia    Incisional ventral hernia. S/P repair by Dr. 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 WHulen Skains (08/2006)   Past Surgical History:  Past Surgical History:  Procedure Laterality Date   APPENDECTOMY     AV FISTULA PLACEMENT Left 03/10/2022   Procedure: LEFT ARM ARTERIOVENOUS (AV) FISTULA CREATION VERSUS GRAFT;  Surgeon: CWaynetta Sandy MD;  [facility]  Service: Vascular;  Laterality: Left;   COLON SURGERY  08/2006   Partial  right colectomy and anastomosis with resection of villous adenoma.   HEMORRHOID SURGERY     hemorrhoidectomy   TONSILLECTOMY     TUBAL LIGATION     VENTRAL HERNIA REPAIR  08/2008   By Dr. WHulen Skains    Assessment & Plan Clinical Impression:  Gina Bailey a 76year old [facility] complaining of left facial droop, slurred speech. [place] CTH negative.  Neurology consulted.  MRI with findings of right posterior limb internal capsule as well as left tiny periventricular white matter lacunar infarcts.  MR angiogram of the brain and neck is suboptimal but showed no large vessel stenosis or occlusion.  Etiology likely secondary to chronic small vessel disease.  2D echo with left ventricular ejection fraction 60 to 65%, LDL 135 and hemoglobin A1c 5.4.  She was started on heparin subcutaneously for VTE prophylaxis.  Aspirin 81 mg daily and clopidogrel 75 mg daily for 3 weeks initiated on 9/28 with recommendations to continue aspirin alone.  She was started on Crestor 20 mg daily.  Her neurologic deficits have improved.  She is tolerating heart healthy/D3 diet and thin liquids.  Acute pain of left great toe which on physical exam appeared swollen and warm to touch.  Primary team has initiated prednisone for 3 days. [place] B[place]. [place]  She has CKD 5 [facility], to mature at 11/23. No indication for acute dialysis currently.  The patient requires inpatient physical medicine and rehabilitation evaluations and treatment secondary to dysfunction due to right internal capsule infarct.     Patient currently requires mod with mobility secondary to muscle weakness and abnormal tone, decreased coordination, and decreased motor planning.  Prior to hospitalization, patient was supervision with mobility and lived with Son in a House home.  Home access is 1 STEStairs to enter.  Patient will benefit from skilled PT  intervention to maximize safe functional mobility, minimize fall risk, and decrease caregiver burden for planned discharge home with 24 hour supervision.  Anticipate patient will benefit from follow up OP at discharge.  PT - End of Session Activity Tolerance: Tolerates 30+ min activity with multiple rests Endurance Deficit: Yes PT Assessment Rehab Potential (ACUTE/IP ONLY): Good PT Barriers to Discharge: Inaccessible home environment;Lack of/limited family support PT Barriers to Discharge Comments: STE w/o rails PT Patient demonstrates impairments in the following area(s): Balance;Safety;Endurance;Motor PT Transfers Functional Problem(s): Bed Mobility;Bed to Chair;Car;Furniture PT Locomotion Functional Problem(s): Ambulation;Wheelchair Mobility;Stairs PT Plan PT Intensity: Minimum of 1-2 x/day ,45 to 90 minutes PT Frequency: 5 out of 7 days PT Duration Estimated Length of Stay: 2 weeks. PT Treatment/Interventions: Ambulation/gait training;Discharge planning;Functional mobility training;Therapeutic Activities;UE/LE Strength taining/ROM;Community reintegration;Balance/vestibular training;Patient/family education;Neuromuscular re-education;Stair training;Therapeutic Exercise;UE/LE Coordination activities;Wheelchair propulsion/positioning PT Transfers Anticipated Outcome(s): mod I PT Locomotion Anticipated Outcome(s): supervision w/ AD PT Recommendation Follow Up Recommendations: Outpatient PT Patient destination: Home Equipment Recommended: To be determined Equipment Details: probably RW   PT Evaluation Precautions/Restrictions Precautions Precautions: Fall Precaution Comments: impulsive, L inattention Restrictions Weight Bearing Restrictions: No General Chart Reviewed: Yes Family/Caregiver Present: Yes Vital SignsTherapy Vitals Temp: 98.2 F (36.8 C) Temp Source: Oral Pulse Rate: 81 Resp: 17 BP: (!) 155/71 Patient Position (if appropriate): Sitting Oxygen Therapy SpO2: 100  % O2 Device: Room Air Pain Pain Assessment Pain Scale: 0-10 Pain Score: 0-No pain Pain Interference Pain Interference Pain Effect on Sleep: 1. Rarely or not at all Pain Interference with Therapy Activities: 1. Rarely or not at all Pain Interference with Day-to-Day Activities: 1. Rarely or not at all Home Living/Prior [place] Available Help at Discharge: Family Type of Home: House Home Access: Stairs to enter [employer] of Steps: 1 STE Entrance Stairs-Rails: None Home Layout: One level Bathroom Shower/Tub: Chiropodist: Standard  Lives With: Son Prior Function Level of Independence: Independent with basic ADLs;Independent with gait;Independent with transfers  Able to Take Stairs?: Yes Driving: No Vision/Perception  Vision - History Ability to See in Adequate Light: 1 Impaired Vision - Assessment Eye Alignment: Within Functional Limits Alignment/Gaze Preference: Gaze right Tracking/Visual Pursuits: Decreased smoothness of horizontal tracking;Decreased smoothness of vertical tracking;Requires cues, head turns, or add eye shifts to track Saccades: Additional eye shifts occurred during testing Convergence: Impaired (comment) Perception Perception: Impaired Inattention/Neglect: Does not attend to left side of body;Does not attend to left visual field Praxis Praxis: Intact  Cognition Overall Cognitive Status: Within Functional Limits for tasks assessed Arousal/Alertness: Awake/alert Attention: Selective Focused Attention: Appears intact Sustained Attention: Appears intact Selective Attention: Impaired Selective Attention Impairment: Functional basic Memory: Impaired Memory Impairment: Decreased short term memory;Decreased recall of new information Awareness: Impaired Awareness Impairment: Intellectual impairment Problem Solving: Impaired Problem Solving Impairment: Functional basic Executive Function: Reasoning Reasoning:  Impaired Sequencing: Impaired Safety/Judgment: Impaired Sensation Sensation Light Touch: Appears Intact Light Touch Impaired Details: Impaired LUE;Impaired LLE Hot/Cold: Appears Intact Proprioception: Impaired Detail Proprioception Impaired Details: Impaired LUE;Impaired LLE Stereognosis: Not tested Coordination Gross Motor Movements are Fluid and Coordinated:  No Fine Motor Movements are Fluid and Coordinated: No Coordination and Movement Description: Uncoordinated movementLLE Finger Nose Finger Test: Uncoordinated movements when reaching for finger in left visual field. Required increased time to notice and touch finger. Heel Shin Test: decreased LLE performance. Motor  Motor Motor: Hemiplegia Motor - Skilled Clinical Observations: Decreased awareness of left side. Slowed motor movments of LUE and LLE.   Trunk/Postural Assessment  Cervical Assessment Cervical Assessment: Exceptions to [facility] (forward head) Thoracic Assessment Thoracic Assessment: Exceptions to [facility] (rounded shoulders.) Lumbar Assessment Lumbar Assessment: Within Functional Limits Postural Control Postural Control: Deficits on evaluation Trunk Control: Min A to maintian trunk control when seated unsupported Righting Reactions: delayed  Balance Balance Balance Assessed: Yes Static Sitting Balance Static Sitting - Balance Support: Feet supported Static Sitting - Level of Assistance: 5: Stand by assistance Dynamic Sitting Balance Dynamic Sitting - Balance Support: Right upper extremity supported Dynamic Sitting - Level of Assistance: 4: Min assist Static Standing Balance Static Standing - Balance Support: Bilateral upper extremity supported Static Standing - Level of Assistance: 3: Mod assist Dynamic Standing Balance Dynamic Standing - Balance Support: During functional activity Dynamic Standing - Level of Assistance: 3: Mod assist Extremity Assessment  RUE Assessment RUE Assessment: Within Functional  Limits LUE Assessment LUE Assessment: Exceptions to [facility] LUE Body System: Neuro Brunstrum levels for arm and hand: Arm;Hand Brunstrum level for arm: Stage V Relative Independence from Synergy Brunstrum level for hand: Stage VI Isolated joint movements LUE Tone LUE Tone: Modified Ashworth Body Part - Modified Ashworth Scale: Elbow;Wrist;Thumb;Fingers RLE Assessment RLE Assessment: Within Functional Limits LLE Assessment LLE Assessment: Within Functional Limits General Strength Comments: strength grossly 4-4+/5  Care Tool Care Tool Bed Mobility Roll left and right activity   Roll left and right assist level: Minimal Assistance - Patient > 75%    Sit to lying activity   Sit to lying assist level: Minimal Assistance - Patient > 75%    Lying to sitting on side of bed activity   Lying to sitting on side of bed assist level: the ability to move from lying on the back to sitting on the side of the bed with no back support.: Minimal Assistance - Patient > 75%     Care Tool Transfers Sit to stand transfer   Sit to stand assist level: Moderate Assistance - Patient 50 - 74%    Chair/bed transfer   Chair/bed transfer assist level: Minimal Assistance - Patient > 75%     Toilet transfer   Assist Level: Minimal Assistance - Patient > 75%    Car transfer   Car transfer assist level: Moderate Assistance - Patient 50 - 74%      Care Tool Locomotion Ambulation   Assist level: Moderate Assistance - Patient 50 - 74% Assistive device: Walker-rolling Max distance: 90  Walk 10 feet activity   Assist level: Moderate Assistance - Patient - 50 - 74% Assistive device: Walker-rolling   Walk 50 feet with 2 turns activity   Assist level: Moderate Assistance - Patient - 50 - 74% Assistive device: Walker-rolling  Walk 150 feet activity Walk 150 feet activity did not occur: Safety/medical concerns      Walk 10 feet on uneven surfaces activity Walk 10 feet on uneven surfaces activity did not  occur: Safety/medical concerns      Stairs   Assist level: Moderate Assistance - Patient - 50 - 74% Stairs assistive device: 2 hand rails Max number of stairs: 4  Walk up/down 1 step activity  Walk up/down 1 step (curb) assist level: Moderate Assistance - Patient - 50 - 74% Walk up/down 1 step or curb assistive device: 2 hand rails  Walk up/down 4 steps activity   Walk up/down 4 steps assist level: Moderate Assistance - Patient - 50 - 74% Walk up/down 4 steps assistive device: 2 hand rails  Walk up/down 12 steps activity Walk up/down 12 steps activity did not occur: Safety/medical concerns      Pick up small objects from floor   Pick up small object from the floor assist level: Minimal Assistance - Patient > 75%    Wheelchair Is the patient using a wheelchair?: Yes Type of Wheelchair: Manual   Wheelchair assist level: Minimal Assistance - Patient > 75% Max wheelchair distance: 90  Wheel 50 feet with 2 turns activity   Assist Level: Minimal Assistance - Patient > 75%  Wheel 150 feet activity Wheelchair 150 feet activity did not occur: Safety/medical concerns      Refer to Care Plan for Long Term Goals  SHORT TERM GOAL WEEK 1 PT Short Term Goal 1 (Week 1): Pt will transfer sup <> sit w/ CGA PT Short Term Goal 2 (Week 1): Pt will transfer sit to stand w/ min A PT Short Term Goal 3 (Week 1): Pt willamb 100' w/ RW and min A PT Short Term Goal 4 (Week 1): Pt will assess curb height step w/ AD  Recommendations for other services: None   Skilled Therapeutic Intervention Evaluation completed (see details above and below) with education on PT POC and goals and individual treatment initiated with focus on  NMR LLE, balance, gait, safety.  Pt presents sitting in w/c and agreeable to therapy, but needs to use BR. Pt wheeled to BR and performed sit to stand and SPT using railing w/ mod to min A.  Pt continent of urine in toilet, NT to chart.  Pt performed SPT w/c <> bed w/ mod to min A  and verbal cues.  Pt transfers sup <> sit w/ mod to min A from flat bed.  Pt wheeled w/c x 90' in hallways w/ supervision and cueing for L inattention and for increasing L shoulder flexion to increase propulsion.  Pt amb multiple short distances w/ RW and mod to min A during session, including <> care.  Pt requires mod A and cueing for impulsiveness w/ transfers in/out of car.  Pt wheeled to stairs and amb x 5' w/ RW and min A.  Pt negotiated 4 steps w/ B rails and mod to min A, reciprocal gait (self-selected even after cueing for step-to).  Pt amb x 90' w/ RW and noted crowding to left of RW.  W/ verbal cueing pt able to increase glut and abductor firing and improved upright posture.  Pt remained sitting in w/c w/ chair alarm on and all needs in reach.   Mobility Bed Mobility Bed Mobility: Rolling Right;Rolling Left;Right Sidelying to Sit;Sit to Supine Rolling Right: Minimal Assistance - Patient > 75% Rolling Left: Minimal Assistance - Patient > 75% Right Sidelying to Sit: Minimal Assistance - Patient > 75%;Moderate Assistance - Patient 50-74% Supine to Sit: Minimal Assistance - Patient > 75% Sit to Supine: Moderate Assistance - Patient 50-74% Transfers Transfers: Sit to Stand;Stand Pivot Transfers Sit to Stand: Moderate Assistance - Patient 50-74% Stand Pivot Transfers: Moderate Assistance - Patient 50 - 74% Stand Pivot Transfer Details: Verbal cues for precautions/safety;Verbal cues for sequencing Stand Pivot Transfer Details (indicate cue type and reason): w/o AD Transfer (  Assistive device): 1 person hand held assist Locomotion  Gait Ambulation: Yes Gait Assistance: Moderate Assistance - Patient 50-74% Gait Distance (Feet): 90 Feet Assistive device: Rolling walker Gait Assistance Details: Verbal cues for technique;Verbal cues for safe use of DME/AE Gait Gait: Yes Gait Pattern: Lateral hip instability;Decreased weight shift to right;Narrow base of support;Trunk flexed Gait velocity:  Decreased Stairs / Additional Locomotion Stairs: Yes Stairs Assistance: Moderate Assistance - Patient 50 - 74% Stair Management Technique: Two rails Number of Stairs: 4 Height of Stairs: 6 Wheelchair Mobility Wheelchair Mobility: Yes Wheelchair Assistance: Minimal assistance - Patient >75% Wheelchair Propulsion: Both upper extremities Wheelchair Parts Management: Needs assistance Distance: 90   Discharge Criteria: Patient will be discharged from PT if patient refuses treatment 3 consecutive times without medical reason, if treatment goals not met, if there is a change in medical status, if patient makes no progress towards goals or if patient is discharged from hospital.  The above assessment, treatment plan, treatment alternatives and goals were discussed and mutually agreed upon: by patient and by family  Ladoris Gene 04/23/2022, 3:43 PM

## 2022-04-23 NOTE — Plan of Care (Signed)
  Problem: RH Balance Goal: LTG: Patient will maintain dynamic sitting balance (OT) Description: LTG:  Patient will maintain dynamic sitting balance with assistance during activities of daily living (OT) Flowsheets (Taken 04/23/2022 1521) LTG: Pt will maintain dynamic sitting balance during ADLs with: Supervision/Verbal cueing Goal: LTG Patient will maintain dynamic standing with ADLs (OT) Description: LTG:  Patient will maintain dynamic standing balance with assist during activities of daily living (OT)  Flowsheets (Taken 04/23/2022 1521) LTG: Pt will maintain dynamic standing balance during ADLs with: Supervision/Verbal cueing   Problem: Sit to Stand Goal: LTG:  Patient will perform sit to stand in prep for activites of daily living with assistance level (OT) Description: LTG:  Patient will perform sit to stand in prep for activites of daily living with assistance level (OT) Flowsheets (Taken 04/23/2022 1521) LTG: PT will perform sit to stand in prep for activites of daily living with assistance level: Supervision/Verbal cueing

## 2022-04-23 NOTE — Progress Notes (Signed)
Inpatient Rehabilitation  Patient information reviewed and entered into eRehab system by Travis Mastel Muneer Leider, OTR/L, Rehab Quality Coordinator.   Information including medical coding, functional ability and quality indicators will be reviewed and updated through discharge.   

## 2022-04-23 NOTE — Evaluation (Signed)
Occupational Therapy Assessment and Plan  Patient Details  Name: Gina Bailey MRN: [id number] Date of Birth: Apr 28, 1946  OT Diagnosis: hemiplegia affecting non-dominant side and muscle weakness (generalized) Rehab Potential: Rehab Potential (ACUTE ONLY): Good ELOS: 10-14 days   Today's Date: 04/23/2022 OT Individual Time: 1000-1100 OT Individual Time Calculation (min): 60 min     Hospital Problem: Principal Problem:   Acute ischemic stroke [facility]) Active Problems:   Dysphagia   Anemia   Past Medical History:  Past Medical History:  Diagnosis Date   Chronic kidney disease    Diabetes mellitus    Diet-controlled. Not on antiglycemic medications.   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  Based on the size, patient's age, status as a former smoker, and its discovery in a community setting, her probability of malignancy is 3% (low probability).  Based on this, I recommend follow-up with a CT scan around 05/2014, and if lesion is stable or smaller, no further follow-up is needed.  CT Chest 06/2015: Stable 37mm pulmonary nodule. No   Ventral hernia    Incisional ventral hernia. S/P repair by Dr. 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 Hulen Skains. (08/2006)   Past Surgical History:  Past Surgical History:  Procedure Laterality Date   APPENDECTOMY     AV FISTULA PLACEMENT Left 03/10/2022   Procedure: LEFT ARM ARTERIOVENOUS (AV) FISTULA CREATION VERSUS GRAFT;  Surgeon: Waynetta Sandy, MD;  Location: [place];  Service: Vascular;  Laterality: Left;   COLON SURGERY  08/2006   Partial right colectomy and  anastomosis with resection of villous adenoma.   HEMORRHOID SURGERY     hemorrhoidectomy   TONSILLECTOMY     TUBAL LIGATION     VENTRAL HERNIA REPAIR  08/2008   By Dr. Hulen Skains.    Assessment & Plan Clinical Impression: Patient is a 76 y.o. year old female who presented via EMS to [facility] ED on 04/15/2022 complaining of left facial droop, slurred speech. Secor CTH negative.  Neurology consulted.  MRI with findings of right posterior limb internal capsule as well as left tiny periventricular white matter lacunar infarcts.  MR angiogram of the brain and neck is suboptimal but showed no large vessel stenosis or occlusion.  Etiology likely secondary to chronic small vessel disease.  2D echo with left ventricular ejection fraction 60 to 65%, LDL 135 and hemoglobin A1c 5.4.  She was started on heparin subcutaneously for VTE prophylaxis.  Aspirin 81 mg daily and clopidogrel 75 mg daily for 3 weeks initiated on 9/28 with recommendations to continue aspirin alone.  She was started on Crestor 20 mg daily.  Her neurologic deficits have improved.  She is tolerating heart healthy/D3 diet and thin liquids.  Acute pain of left great toe which on physical exam appeared swollen and warm to touch.  Primary team has initiated prednisone for 3 days. Having BMs. Using [place].  She has CKD 5 followed by Dr. Royce Macadamia at [facility]. LUE AVF as placed, to mature at 11/23. No indication for acute dialysis currently.  Patient  transferred to CIR on 04/22/2022 .    Patient currently requires mod with basic self-care skills and min to mod A for basic mobility   secondary to muscle weakness and muscle joint tightness, decreased cardiorespiratoy endurance, impaired timing and sequencing, unbalanced muscle activation, motor apraxia, decreased coordination, and decreased motor planning, decreased visual perceptual skills, decreased attention to left and left side neglect, decreased attention, decreased awareness, decreased problem  solving, decreased safety awareness, decreased memory, and delayed processing, and decreased sitting balance, decreased standing balance, decreased postural control, hemiplegia, and decreased balance strategies.  Prior to hospitalization, patient could complete ADLs with supervision.  Patient will benefit from skilled intervention to decrease level of assist with basic self-care skills and increase independence with basic self-care skills prior to discharge home with care partner.  Anticipate patient will require 24 hour supervision and follow up home health.  OT - End of Session Activity Tolerance: Tolerates 30+ min activity with multiple rests Endurance Deficit: Yes OT Assessment Rehab Potential (ACUTE ONLY): Good OT Patient demonstrates impairments in the following area(s): Balance;Cognition;Motor;Endurance;Perception;Safety;Sensory;Skin Integrity;Vision OT Basic ADL's Functional Problem(s): Bathing;Dressing;Toileting;Grooming OT Transfers Functional Problem(s): Toilet;Tub/Shower OT Additional Impairment(s): Fuctional Use of Upper Extremity OT Plan OT Intensity: Minimum of 1-2 x/day, 45 to 90 minutes OT Frequency: 5 out of 7 days OT Duration/Estimated Length of Stay: 10-14 days OT Treatment/Interventions: Balance/vestibular training;Discharge planning;Self Care/advanced ADL retraining;Therapeutic Activities;UE/LE Coordination activities;Cognitive remediation/compensation;Functional mobility training;Patient/family education;Skin care/wound managment;Therapeutic Exercise;Visual/perceptual remediation/compensation;Wheelchair propulsion/positioning;UE/LE Strength taining/ROM;Psychosocial support;Neuromuscular re-education;DME/adaptive equipment instruction;Community reintegration OT Self Feeding Anticipated Outcome(s): n/a OT Basic Self-Care Anticipated Outcome(s): Supervision OT Toileting Anticipated Outcome(s): Supervison OT Bathroom Transfers Anticipated Outcome(s): Supervision OT  Recommendation Patient destination: Home Follow Up Recommendations: 24 hour supervision/assistance;Home health OT Equipment Recommended: To be determined   OT Evaluation Precautions/Restrictions  Precautions Precautions: Fall Restrictions Weight Bearing Restrictions: No General Chart Reviewed: Yes Family/Caregiver Present: Yes (Son present at begining of session)  Pain Pain Assessment Pain Score: 0-No pain Home Living/Prior Functioning Home Living Family/patient expects to be discharged to:: Private residence Living Arrangements: Children Available Help at Discharge: Family Type of Home: House Home Access: Stairs to enter Technical brewer of Steps: 1 STE Home Layout: One level Bathroom Shower/Tub: Chiropodist: Standard  Lives With: Son IADL History Homemaking Responsibilities: No Education: some college Prior Function Level of Independence: Independent with basic ADLs Vocation: Retired Surveyor, mining Baseline Vision/History: 1 Wears glasses Ability to See in Adequate Light: 1 Impaired Patient Visual Report: No change from baseline Vision Assessment?: Yes Eye Alignment: Within Functional Limits Alignment/Gaze Preference: Gaze right Tracking/Visual Pursuits: Decreased smoothness of horizontal tracking;Decreased smoothness of vertical tracking;Requires cues, head turns, or add eye shifts to track Saccades: Additional eye shifts occurred during testing Convergence: Impaired (comment) Perception  Perception: Impaired Inattention/Neglect: Does not attend to left side of body;Does not attend to left visual field Praxis Praxis: Intact Cognition Cognition Overall Cognitive Status: History of cognitive impairments - at baseline Arousal/Alertness: Awake/alert Orientation Level: Person;Place;Situation Person: Oriented Place: Oriented Situation: Oriented Memory: Impaired Memory Impairment: Decreased short term memory;Decreased recall of new  information Attention: Selective Focused Attention: Appears intact Sustained Attention: Appears intact Sustained Attention Impairment: Verbal basic Selective Attention: Impaired Selective Attention Impairment: Functional basic Awareness: Impaired Awareness Impairment: Intellectual impairment Problem Solving: Impaired Problem Solving Impairment: Functional basic Executive Function: Reasoning Reasoning: Impaired Sequencing: Impaired Safety/Judgment: Impaired Brief Interview for Mental Status (BIMS) Repetition of Three Words (First Attempt): 3 Temporal Orientation: Year: Correct Temporal Orientation: Month: Accurate within 5 days Temporal Orientation: Day: Incorrect Recall: "Sock":  No, could not recall Recall: "Blue": Yes, after cueing ("a color") Recall: "Bed": No, could not recall BIMS Summary Score: 9 Sensation Sensation Light Touch: Impaired Detail Light Touch Impaired Details: Impaired LUE;Impaired LLE Hot/Cold: Appears Intact Proprioception: Impaired Detail Proprioception Impaired Details: Impaired LUE;Impaired LLE Stereognosis: Not tested Coordination Gross Motor Movements are Fluid and Coordinated: No Fine Motor Movements are Fluid and Coordinated: No Coordination and Movement Description: Slowed uncoordinated movements in LUE Finger Nose Finger Test: Uncoordinated movements when reaching for finger in left visual field. Required increased time to notice and touch finger. Motor  Motor Motor: Hemiplegia;Motor apraxia Motor - Skilled Clinical Observations: Decreased awareness of left side. Slowed motor movments of LUE and LLE.  Trunk/Postural Assessment  Cervical Assessment Cervical Assessment:  (forward head) Thoracic Assessment Thoracic Assessment: Within Functional Limits Lumbar Assessment Lumbar Assessment: Within Functional Limits Postural Control Postural Control: Deficits on evaluation Trunk Control: Min A to maintian trunk control when seated  unsupported Righting Reactions: delayed  Balance Balance Balance Assessed: Yes Dynamic Sitting Balance Dynamic Sitting - Balance Support: Right upper extremity supported Dynamic Sitting - Level of Assistance: 4: Min assist Static Standing Balance Static Standing - Balance Support: During functional activity Static Standing - Level of Assistance: 4: Min assist Dynamic Standing Balance Dynamic Standing - Balance Support: During functional activity Dynamic Standing - Level of Assistance: 3: Mod assist Extremity/Trunk Assessment RUE Assessment RUE Assessment: Within Functional Limits LUE Assessment LUE Assessment: Exceptions to [facility] LUE Body System: Neuro Brunstrum levels for arm and hand: Arm;Hand Brunstrum level for arm: Stage V Relative Independence from Synergy Brunstrum level for hand: Stage VI Isolated joint movements LUE Tone LUE Tone: Modified Ashworth Body Part - Modified Ashworth Scale: Elbow;Wrist;Thumb;Fingers  Care Tool Care Tool Self Care Eating   Eating Assist Level: Minimal Assistance - Patient > 75%    Oral Care    Oral Care Assist Level: Minimal Assistance - Patient > 75%    Bathing   Body parts bathed by patient: Right arm;Chest;Abdomen;Left arm;Front perineal area;Right upper leg;Left upper leg;Face;Right lower leg (Pt completed bathing sitting on shower chair in shower) Body parts bathed by helper: Buttocks;Left lower leg   Assist Level: Minimal Assistance - Patient > 75% (Min A required for pt to maintain sitting balance and verbal cueing required for pt to wash left side of body.)    Upper Body Dressing(including orthotics)   What is the patient wearing?: Pull over shirt   Assist Level: Minimal Assistance - Patient > 75% (Pt doned shirt sitting in wc and required assistance to orient front/back of shirt and pull shirt down back.)    Lower Body Dressing (excluding footwear)   What is the patient wearing?: Pants;Underwear/pull up Assist for lower body  dressing: Moderate Assistance - Patient 50 - 74% (Pt donned pants sitting in wc. Mod A required to weave left leg into pants and stand to pull pants over waist.)    Putting on/Taking off footwear   What is the patient wearing?: Socks Assist for footwear: Moderate Assistance - Patient 50 - 74% (Doffed socks sitting on shower chair. Mod A to remove left sock.)       Care Tool Toileting Toileting activity   Assist for toileting: Maximal Assistance - Patient 25 - 49% (Toileting completed in bathroom on standard toilet.)     Care Tool Bed Mobility Roll left and right activity   Roll left and right assist level: Minimal Assistance - Patient > 75%    Sit to lying activity  Sit to lying assist level: Minimal Assistance - Patient > 75%    Lying to sitting on side of bed activity   Lying to sitting on side of bed assist level: the ability to move from lying on the back to sitting on the side of the bed with no back support.: Minimal Assistance - Patient > 75%     Care Tool Transfers Sit to stand transfer   Sit to stand assist level: Moderate Assistance - Patient 50 - 74%    Chair/bed transfer   Chair/bed transfer assist level: Moderate Assistance - Patient 50 - 74%     Toilet transfer   Assist Level: Moderate Assistance - Patient 50 - 74%     Care Tool Cognition  Expression of Ideas and Wants Expression of Ideas and Wants: 3. Some difficulty - exhibits some difficulty with expressing needs and ideas (e.g, some words or finishing thoughts) or speech is not clear  Understanding Verbal and Non-Verbal Content Understanding Verbal and Non-Verbal Content: 3. Usually understands - understands most conversations, but misses some part/intent of message. Requires cues at times to understand   Memory/Recall Ability     Refer to Care Plan for Long Term Goals  SHORT TERM GOAL WEEK 1 OT Short Term Goal 1 (Week 1): Pt will locate items on left side with min cues OT Short Term Goal 2 (Week 1): Pt  will be able to comlete sit to stands with supervision OT Short Term Goal 3 (Week 1): Pt will be able to complete stand pivot transfer to toilet with CGA  Recommendations for other services: None    Skilled Therapeutic Intervention  1:1 OT eval initiated with OT purpose, role, and goals discussed with patient and patient son.  Self-care:  Pt. Supine in bed upon OT arrival with son present in room. Pt. Stated they urgently needed to use the bathroom and completed stand pivot transfer with mod A from therapist. Completed toileting on standard toilet seat with mod A for clothing management. Pt completed peri care while seated on toilet by leaning R/L. Pt ambulated to shower with mod A with HHA with facilitation for weight shifts and showered on shower seat with mod A. Items for self-care provided on left side throughout session to increase awareness to left side. Verbal and tactile cueing provided throughout session during bed, toilet, and shower transfers to increase safety. Pt. Completed ADLs in front of mirror while seated in wheelchair to increase body awareness and provided visual feedback during tasks. Pt. Educated on modified strategy for dressing (dressing LLE and LUE first). PT left in w/c with call bell and safety belt.   ADL ADL Upper Body Bathing: Minimal assistance;Moderate cueing Where Assessed-Upper Body Bathing: Shower Lower Body Bathing: Moderate cueing;Minimal assistance Where Assessed-Lower Body Bathing: Shower Upper Body Dressing: Minimal cueing;Minimal assistance Where Assessed-Upper Body Dressing: Wheelchair Lower Body Dressing: Moderate cueing;Moderate assistance Where Assessed-Lower Body Dressing: Wheelchair Toileting: Maximal assistance Where Assessed-Toileting: Glass blower/designer: Moderate assistance Toilet Transfer Method: Squat pivot Toilet Transfer Equipment: Energy manager: Environmental education officer Method:  Heritage manager: Shower seat with back Mobility  Bed Mobility Bed Mobility: Supine to Sit Supine to Sit: Minimal Assistance - Patient > 75% Transfers Sit to Stand: Moderate Assistance - Patient 50-74%   Discharge Criteria: Patient will be discharged from OT if patient refuses treatment 3 consecutive times without medical reason, if treatment goals not met, if there is a change in medical status, if patient makes  no progress towards goals or if patient is discharged from hospital.  The above assessment, treatment plan, treatment alternatives and goals were discussed and mutually agreed upon: by patient  Janey Genta 04/23/2022, 2:34 PM

## 2022-04-24 DIAGNOSIS — R1312 Dysphagia, oropharyngeal phase: Secondary | ICD-10-CM

## 2022-04-24 DIAGNOSIS — N185 Chronic kidney disease, stage 5: Secondary | ICD-10-CM | POA: Diagnosis not present

## 2022-04-24 DIAGNOSIS — I639 Cerebral infarction, unspecified: Secondary | ICD-10-CM | POA: Diagnosis not present

## 2022-04-24 LAB — BASIC METABOLIC PANEL
Anion gap: 10 (ref 5–15)
BUN: 83 mg/dL — ABNORMAL HIGH (ref 8–23)
CO2: 22 mmol/L (ref 22–32)
Calcium: 8.8 mg/dL — ABNORMAL LOW (ref 8.9–10.3)
Chloride: 107 mmol/L (ref 98–111)
Creatinine, Ser: 5.44 mg/dL — ABNORMAL HIGH (ref 0.44–1.00)
GFR, Estimated: 8 mL/min — ABNORMAL LOW (ref >60)
Glucose, Bld: 196 mg/dL — ABNORMAL HIGH (ref 70–99)
Potassium: 4.8 mmol/L (ref 3.5–5.1)
Sodium: 139 mmol/L (ref 135–145)

## 2022-04-24 NOTE — Progress Notes (Signed)
PROGRESS NOTE   Subjective/Complaints: No new concerns or complaints this morning. She just ate her breakfast.   Review of Systems  Constitutional:  Negative for chills and fever.  HENT:  Negative for congestion.   Eyes:  Negative for double vision.  Respiratory:  Negative for cough.   Cardiovascular:  Negative for chest pain.  Gastrointestinal:  Negative for abdominal pain, diarrhea, heartburn, nausea and vomiting.  Genitourinary: Negative.   Musculoskeletal: Negative.   Neurological:  Positive for weakness. Negative for dizziness.     Objective:   No results found. Recent Labs    04/22/22 1532 04/23/22 0512  WBC 7.7 8.6  HGB 8.0* 8.1*  HCT 25.9* 25.8*  PLT 313 299    Recent Labs    04/23/22 0512 04/24/22 0452  NA 138 139  K 5.0 4.8  CL 103 107  CO2 23 22  GLUCOSE 161* 196*  BUN 81* 83*  CREATININE 5.77* 5.44*  CALCIUM 8.9 8.8*     Intake/Output Summary (Last 24 hours) at 04/24/2022 0807 Last data filed at 04/24/2022 0755 Gross per 24 hour  Intake 1420 ml  Output 250 ml  Net 1170 ml         Physical Exam: Vital Signs Blood pressure 129/67, pulse 72, temperature 97.6 F (36.4 C), temperature source Oral, resp. rate 18, height '5\' 6"'$  (1.676 m), weight 77 kg, last menstrual period 10/22/1978, SpO2 98 %.  General: No apparent distress HEENT: PERRLA, conjugate gaze, sclera anicteric, oral mucosa pink and moist,   Neck: Supple without JVD or lymphadenopathy Heart: Reg rate and rhythm. No murmurs rubs or gallops Chest: CTA bilaterally without wheezes, rales, or rhonchi; no distress Abdomen: Soft, non-tender, non-distended, bowel sounds positive. Extremities: No clubbing, cyanosis, Pulses are 2+ Psych: Pt's affect is normal Skin: warm and dry Neuro:  Alert to person and date only, not able to say what brought her to the hospital, decreased memory, follows simple commands, CN 2-12 intact orther than  L facial weakness, L inattention, she has dysarthria Sensation intact to LT in all 4 extremities Strength 5/5 in b/l UE and RLE, strength at least 4-/5 in proximal LLE and at least 2/5 in distal LLE however she provides limited effort due to pain FTN intact b/l Musculoskeletal:  Trace edema, left GT, 1st metatarsal head TTP   Assessment/Plan: 1. Functional deficits which require 3+ hours per day of interdisciplinary therapy in a comprehensive inpatient rehab setting. Physiatrist is providing close team supervision and 24 hour management of active medical problems listed below. Physiatrist and rehab team continue to assess barriers to discharge/monitor patient progress toward functional and medical goals  Care Tool:  Bathing    Body parts bathed by patient: Right arm, Chest, Abdomen, Left arm, Front perineal area, Right upper leg, Left upper leg, Face, Right lower leg (Pt completed bathing sitting on shower chair in shower)   Body parts bathed by helper: Buttocks, Left lower leg     Bathing assist Assist Level: Minimal Assistance - Patient > 75% (Min A required for pt to maintain sitting balance and verbal cueing required for pt to wash left side of body.)     Upper Body Dressing/Undressing  Upper body dressing   What is the patient wearing?: Pull over shirt    Upper body assist Assist Level: Minimal Assistance - Patient > 75% (Pt doned shirt sitting in wc and required assistance to orient front/back of shirt and pull shirt down back.)    Lower Body Dressing/Undressing Lower body dressing      What is the patient wearing?: Pants, Underwear/pull up     Lower body assist Assist for lower body dressing: Moderate Assistance - Patient 50 - 74% (Pt donned pants sitting in wc. Mod A required to weave left leg into pants and stand to pull pants over waist.)     Toileting Toileting    Toileting assist Assist for toileting: Minimal Assistance - Patient > 75%     Transfers Chair/bed  transfer  Transfers assist     Chair/bed transfer assist level: Minimal Assistance - Patient > 75%     Locomotion Ambulation   Ambulation assist      Assist level: Moderate Assistance - Patient 50 - 74% Assistive device: Walker-rolling Max distance: 90   Walk 10 feet activity   Assist     Assist level: Moderate Assistance - Patient - 50 - 74% Assistive device: Walker-rolling   Walk 50 feet activity   Assist    Assist level: Moderate Assistance - Patient - 50 - 74% Assistive device: Walker-rolling    Walk 150 feet activity   Assist Walk 150 feet activity did not occur: Safety/medical concerns         Walk 10 feet on uneven surface  activity   Assist Walk 10 feet on uneven surfaces activity did not occur: Safety/medical concerns         Wheelchair     Assist Is the patient using a wheelchair?: Yes Type of Wheelchair: Manual    Wheelchair assist level: Minimal Assistance - Patient > 75% Max wheelchair distance: 90    Wheelchair 50 feet with 2 turns activity    Assist        Assist Level: Minimal Assistance - Patient > 75%   Wheelchair 150 feet activity     Assist  Wheelchair 150 feet activity did not occur: Safety/medical concerns       Blood pressure 129/67, pulse 72, temperature 97.6 F (36.4 C), temperature source Oral, resp. rate 18, height '5\' 6"'$  (1.676 m), weight 77 kg, last menstrual period 10/22/1978, SpO2 98 %.  Medical Problem List and Plan: 1. 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  Antithrombotics: -DVT/anticoagulation:  Pharmaceutical: Heparin             -antiplatelet therapy: aspirin 81 mg and Plavix for 3 weeks then aspirin alone 3. Pain Management: Tylenol as needed 4. Mood/Behavior/Sleep: LCSW  to evaluate and provide emotional support             -antipsychotic agents: none 5. Neuropsych/cognition: This patient does capable of making decisions on her own behalf. 6. Skin/Wound Care: Routine skin care checks 7. 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 Glucose stable on last BMP 10: Chronic BLE edema: minimal currently -continue Lasix 80 mg q AM (40 mg q PM discontinued)             -continue compression stockings when OOB 11: Hypertension: monitor             -continue amlodipine 10 mg daily             -continue carvedilol 12.5 mg BID             -continue Lasix  -10/5 Fair control, continue to monitor 12: Diabetic neuropathy both feet: follows with podiatry 13: Hyperlipidemia: Crestor 20 mg daily 14: Anemia of chronic renal failure: received 4 transfusion of IV Ferrlecit; follow-up CBC  -10/5 HGB stable at 8/1 15: Gout flare: prednisone for 3 days 16: Obesity: dietary counseling 17: Bilateral carotid artery stenosis: outpatient surveillance 18. Dysphagia             -SLP eval, dys 3 diet with thin liquids  -10/6 appears to be eating 80-100% meals 19. Hypermagnesemia  -Mild asymptomatic,  avoid Mg supplements, recheck monday    LOS: 2 days A FACE TO FACE EVALUATION WAS PERFORMED  Gina Bailey 04/24/2022, 8:07 AM

## 2022-04-24 NOTE — Plan of Care (Signed)
  Problem: RH Bathing Goal: LTG Patient will bathe all body parts with assist levels (OT) Description: LTG: Patient will bathe all body parts with assist levels (OT) Flowsheets (Taken 04/24/2022 0842) LTG: Pt will perform bathing with assistance level/cueing: Supervision/Verbal cueing   Problem: RH Dressing Goal: LTG Patient will perform upper body dressing (OT) Description: LTG Patient will perform upper body dressing with assist, with/without cues (OT). Flowsheets (Taken 04/24/2022 [phone]) LTG: Pt will perform upper body dressing with assistance level of: Supervision/Verbal cueing Goal: LTG Patient will perform lower body dressing w/assist (OT) Description: LTG: Patient will perform lower body dressing with assist, with/without cues in positioning using equipment (OT) Flowsheets (Taken 04/24/2022 0842) LTG: Pt will perform lower body dressing with assistance level of: Supervision/Verbal cueing   Problem: RH Toileting Goal: LTG Patient will perform toileting task (3/3 steps) with assistance level (OT) Description: LTG: Patient will perform toileting task (3/3 steps) with assistance level (OT)  Flowsheets (Taken 04/24/2022 0842) LTG: Pt will perform toileting task (3/3 steps) with assistance level: Supervision/Verbal cueing   Problem: RH Functional Use of Upper Extremity Goal: LTG Patient will use RT/LT upper extremity as a (OT) Description: LTG: Patient will use right/left upper extremity as a stabilizer/gross assist/diminished/nondominant/dominant level with assist, with/without cues during functional activity (OT) Flowsheets (Taken 04/24/2022 0842) LTG: Use of upper extremity in functional activities: LUE as nondominant level LTG: Pt will use upper extremity in functional activity with assistance level of: Supervision/Verbal cueing   Problem: RH Toilet Transfers Goal: LTG Patient will perform toilet transfers w/assist (OT) Description: LTG: Patient will perform toilet transfers with  assist, with/without cues using equipment (OT) Flowsheets (Taken 04/24/2022 0842) LTG: Pt will perform toilet transfers with assistance level of: Supervision/Verbal cueing   Problem: RH Tub/Shower Transfers Goal: LTG Patient will perform tub/shower transfers w/assist (OT) Description: LTG: Patient will perform tub/shower transfers with assist, with/without cues using equipment (OT) Flowsheets (Taken 04/24/2022 0842) LTG: Pt will perform tub/shower stall transfers with assistance level of: Supervision/Verbal cueing   Problem: RH Memory Goal: LTG Patient will demonstrate ability for day to day recall/carry over during activities of daily living with assistance level (OT) Description: LTG:  Patient will demonstrate ability for day to day recall/carry over during activities of daily living with assistance level (OT). Flowsheets (Taken 04/24/2022 [phone]) LTG:  Patient will demonstrate ability for day to day recall/carry over during activities of daily living with assistance level (OT): Supervision   Problem: RH Awareness Goal: LTG: Patient will demonstrate awareness during functional activites type of (OT) Description: LTG: Patient will demonstrate awareness during functional activites type of (OT) Flowsheets (Taken 04/24/2022 [phone]) Patient will demonstrate awareness during functional activites type of: Emergent LTG: Patient will demonstrate awareness during functional activites type of (OT): Minimal Assistance - Patient > 75%   Problem: RH Vision Goal: RH LTG Vision Theme park manager) Flowsheets (Taken 04/24/2022 0842) LTG: Vision Goals: Pt will visual scan with supervision during ADL tasks,

## 2022-04-24 NOTE — Progress Notes (Signed)
Speech Language Pathology Daily Session Note  Patient Details  Name: Gina Bailey MRN: [id number] Date of Birth: 10/01/1945  Today's Date: 04/24/2022 SLP Individual Time: 1406-1450 SLP Individual Time Calculation (min): 44 min  Short Term Goals: Week 1: SLP Short Term Goal 1 (Week 1): Patient will consume current diet without overt s/sx of aspiration and self monitoring of oral residuals and/orL  buccal stasis with sup A verbal cues SLP Short Term Goal 2 (Week 1): Patient will implement speech intelligiblity strategies at the conversation level with sup A verbal cues to achieve >90% intelligibility  Skilled Therapeutic Interventions: Skilled ST treatment focused on speech goals. Pt received in wheelchair on arrival. SLP suggesting to brush teeth following pt's lunch. Pt performed thorough oral care at sink while sitting in wheelchair with set-up A and sup A verbal cues to turn off water. SLP then provided education on speech intelligibility strategies using be "A Boss" acronym. Pt verbalized understanding through teach back with mod I for use of handout as external memory aid. Pt implemented "slow down" strategy at the sentence level with sup A verbal cues, and at the conversation level with min A verbal cues. Pt's speech perceived as mildly increased rate and reduced articulatory precision, however perceived as ~90% intelligible even without direct implementation of strategies. Pt requested to get into bed at end of session and performed stand pivot using RW and sup A verbal cues to ensure pt did not stand prior to locking breaks on wheelchair and setting up RW. Patient was left in bed with alarm activated and immediate needs within reach at end of session. Continue per current plan of care.    Of note - per nurse, pt consumed whole pills with water without difficulty this morning. Continue this method as tolerated.     Pain  None/denied  Therapy/Group: Individual Therapy  Patty Sermons 04/24/2022, 4:35 PM

## 2022-04-24 NOTE — Progress Notes (Signed)
Occupational Therapy Session Note  Patient Details  Name: Gina Bailey MRN: [id number] Date of Birth: Sep 25, 1945 Today's Date: 04/24/2022 OT Individual Time: 2637-8588 OT Individual Time Calculation (min): 60 min   Short Term Goals: Week 1:  OT Short Term Goal 1 (Week 1): Pt will locate items on left side with min cues OT Short Term Goal 2 (Week 1): Pt will be able to comlete sit to stands with supervision OT Short Term Goal 3 (Week 1): Pt will be able to complete stand pivot transfer to toilet with CGA  Skilled Therapeutic Interventions/Progress Updates:     Pt received in room resting in bed reading news paper. 0/10 pain reported. Pt motivated to participate in therapy. Patient participated in skilled OT session focused on ADL retraining and functional transfers. Pt required min A to transfer from sit<>stand using RW for balance. Pt ambulated to the bathroom with RW and min A for lateral weight shifting facilitation. Pt completed toileting with min A for clothing management and was able to weight shift laterally to complete peri care with CGA while holding grab bar. Pt the ambulated to the shower with min A and min-mod verbal cueing for safety and problem solving with RW. Pt showered while sitting on shower chair with min A to wash LLE and to stand to wash her bottom. Pt required increased time and additional verbal cues to notice when items were presented to her on her left side. Following shower, Pt ambulated to wc with RW and completed dressing and grooming while seated in wc. Pt required mod A for problem solving to don bra and was able to don shirt with CGA. Verbal cueing required for pt to recall hemidressing sequence during LB dressing, Pt completed LB dressing in front of mirror with mod A to weave left leg into brief/pant leg and maintain standing balance when standing to pull up her pant. Pt was leaning to right side while seated in wheelchair and required moderate verbal cueing to  correct posture in mirror. Pt brushed her teeth while standing at the mirror requiring moderate verbal cues to locate toothbrush and tooth paste located on left side of sink and to maintain correct posture during task. Pt was left sitting in her wc with seatbelt alarm on, call bell in reach, and all needs met.   Therapy Documentation Precautions:  Precautions Precautions: Fall Precaution Comments: impulsive, L inattention Restrictions Weight Bearing Restrictions: (Pended) No General:   Vital Signs: Therapy Vitals Temp: 98.3 F (36.8 C) Temp Source: Oral Pulse Rate: 76 Resp: 17 BP: 132/71 Patient Position (if appropriate): Sitting Oxygen Therapy SpO2: 100 % O2 Device: Room Air   ADL: ADL Upper Body Bathing: Minimal assistance, Minimal cueing Where Assessed-Upper Body Bathing: Shower (Sitting in shower chair) Lower Body Bathing: Minimal assistance, Minimal cueing Where Assessed-Lower Body Bathing: Shower Upper Body Dressing: Minimal assistance, Minimal cueing Where Assessed-Upper Body Dressing: Wheelchair Lower Body Dressing: Moderate assistance, Moderate cueing Where Assessed-Lower Body Dressing: Wheelchair Toileting: Minimal assistance Where Assessed-Toileting: Glass blower/designer: Minimal assistance, Moderate assistance (Verbal cues required for safety with RW) Toilet Transfer Method: Ambulating (RW) Toilet Transfer Equipment: Energy manager: Minimal assistance, Moderate cueing Social research officer, government Method: Print production planner without back Vision Baseline Vision/History: 1 Wears glasses Patient Visual Report: No change from baseline Eye Alignment: Within Functional Limits Ocular Range of Motion: Within Functional Limits Alignment/Gaze Preference: Gaze right Perception  Inattention/Neglect: Does not attend to left side of body;Does not attend to  left visual field    Therapy/Group: Individual Therapy  Janey Genta OTR/L 04/24/2022, 2:18 PM

## 2022-04-24 NOTE — Plan of Care (Signed)
  Problem: RH Swallowing Goal: LTG Patient will consume least restrictive diet using compensatory strategies with assistance (SLP) Description: LTG:  Patient will consume least restrictive diet using compensatory strategies with assistance (SLP) Flowsheets (Taken 04/24/2022 0921) LTG: Pt Patient will consume least restrictive diet using compensatory strategies with assistance of (SLP): Supervision   Problem: RH Expression Communication Goal: LTG Patient will increase speech intelligibility (SLP) Description: LTG: Patient will increase speech intelligibility at word/phrase/conversation level with cues, % of the time (SLP) Flowsheets (Taken 04/24/2022 0921) LTG: Patient will increase speech intelligibility (SLP): Supervision

## 2022-04-24 NOTE — Progress Notes (Signed)
Patient ID: Gina Bailey, female   DOB: September 25, 1945, 76 y.o.   MRN: [id number] Met with the patient to review current situation, rehab process, team conference and plan of care. Discussed secondary risk management including HTN, HLD, CKD along with medications and dietary modification recommendations.Continue to follow along to discharge to address educational needs to facilitate preparation for discharge. Margarito Liner

## 2022-04-24 NOTE — Progress Notes (Signed)
Physical Therapy Session Note  Patient Details  Name: Gina Bailey MRN: [id number] Date of Birth: [birth year]  Today's Date: 04/24/2022 PT Individual Time: 4193-7902 PT Individual Time Calculation (min): 60 min   Short Term Goals: Week 1:  PT Short Term Goal 1 (Week 1): Pt will transfer sup <> sit w/ CGA PT Short Term Goal 2 (Week 1): Pt will transfer sit to stand w/ min A PT Short Term Goal 3 (Week 1): Pt willamb 100' w/ RW and min A PT Short Term Goal 4 (Week 1): Pt will assess curb height step w/ AD  Skilled Therapeutic Interventions/Progress Updates:     Pt sitting in w/c on arrival. Head turned R with L inattention. Agreeable to PT tx and denies pain. Pt highly distracted internally and externally. Pt with decreased insight into deficits and decreased safety awareness overall. Pt reports several falls PTA, primarily occurring while walking outside on grass. She reports she requires assistance from others to get off the ground when she falls. She reports no falls in the home.   Transported in w/c to main rehab gym for time management.   Squat<>pivot transfer with minA and no AD to mat table, towards her R side.   Administered BERG balance testing and TUG testing to assess falls risk. Results outlined below. Seated rest breaks throughout testing.   Gait training with no AD (pt requesting to ambulate without a walker). Ambulated 120f with CGA and no AD - pt frequently stopping due to distractions from staff, pictures in hallway, etc - very easily distracted. Primary gait deficits include decreased L step length, L trunk lean, trunk flexed, R gaze pref. Pt agreeable to RW when encouraged - ambulated from main rehab gym back to her room, ~159f with close supervision and RW - improved stability with RW and pt more understanding of recommendation.  Pt concluded session in w/c with all needs met, alarm on.   Therapy Documentation Precautions:  Precautions Precautions: Fall Precaution  Comments: impulsive, L inattention Restrictions Weight Bearing Restrictions: (P) No General:    Balance: Balance Balance Assessed: Yes Standardized Balance Assessment Standardized Balance Assessment: Berg Balance Test;Timed Up and Go Test Berg Balance Test Sit to Stand: Able to stand using hands after several tries Standing Unsupported: Able to stand 2 minutes with supervision Sitting with Back Unsupported but Feet Supported on Floor or Stool: Able to sit 2 minutes under supervision Stand to Sit: Uses backs of legs against chair to control descent Transfers: Needs one person to assist Standing Unsupported with Eyes Closed: Able to stand 10 seconds with supervision Standing Ubsupported with Feet Together: Able to place feet together independently but unable to hold for 30 seconds From Standing, Reach Forward with Outstretched Arm: Reaches forward but needs supervision From Standing Position, Pick up Object from Floor: Able to pick up shoe, needs supervision From Standing Position, Turn to Look Behind Over each Shoulder: Turn sideways only but maintains balance Turn 360 Degrees: Needs assistance while turning Standing Unsupported, Alternately Place Feet on Step/Stool: Able to complete >2 steps/needs minimal assist Standing Unsupported, One Foot in Front: Loses balance while stepping or standing Standing on One Leg: Unable to try or needs assist to prevent fall Total Score: 23 Timed Up and Go Test TUG: Normal TUG Normal TUG (seconds): 28. TUG Completed with no AD. Scores >13.5 seconds indicate increased falls risk.   Patient demonstrates increased fall risk as noted by score of   23/56 on Berg Balance Scale.  (<36= high risk  for falls, close to 100%; 37-45 significant >80%; 46-51 moderate >50%; 52-55 lower >25%)   Therapy/Group: Individual Therapy   P  PT 04/24/2022, 8:01 AM  

## 2022-04-24 NOTE — Progress Notes (Signed)
Physical Therapy Session Note  Patient Details  Name: Gina Bailey MRN: [id number] Date of Birth: [birth year]  Today's Date: 04/24/2022 PT Individual Time: 1115-1204 PT Individual Time Calculation (min): 49 min   Short Term Goals: Week 1:  PT Short Term Goal 1 (Week 1): Pt will transfer sup <> sit w/ CGA PT Short Term Goal 2 (Week 1): Pt will transfer sit to stand w/ min A PT Short Term Goal 3 (Week 1): Pt willamb 100' w/ RW and min A PT Short Term Goal 4 (Week 1): Pt will assess curb height step w/ AD  Skilled Therapeutic Interventions/Progress Updates:  Patient sitting up in w/c on entrance to room and reading paper. Patient alert and agreeable to PT session.   Patient with no pain complaint at start of session.  Therapeutic Activity: Transfers: Pt performed sit<>stand and stand pivot transfers throughout session with MinA requiring vc/ tc for improved technique including sequence of forward scoot, hip hinge .  Gait Training:  Pt ambulated 25  ft using no AD for UB assessment in standing with HHA with RUE. Demonstrated min to mod pressure into R hand. Pt also guided in amb using RW with supervision covering 150 ft. Provided vc/ tc for upright posture, L attn, level gaze throughout.   Neuromuscular Re-ed: NMR facilitated during session with focus on standing balance. Pt guided in toss of bean bags to target placed 12 ft away. Pt with good balance throughout tossing. Then guided in amb using RW to 3 bean bags and maintains balance while collecting from target surface.  NMR performed for improvements in motor control and coordination, balance, sequencing, judgement, and self confidence/ efficacy in performing all aspects of mobility at highest level of independence.   Patient seated upright in w/c  at end of session with brakes locked, belt alarm set, and all needs within reach.   Therapy Documentation Precautions:  Precautions Precautions: Fall Precaution Comments: impulsive, L  inattention Restrictions Weight Bearing Restrictions: (P) No General:   Vital Signs: Therapy Vitals Temp: 98.3 F (36.8 C) Temp Source: Oral Pulse Rate: 76 Resp: 17 BP: 132/71 Patient Position (if appropriate): Sitting Oxygen Therapy SpO2: 100 % O2 Device: Room Air Pain:   Mobility: Bed Mobility Bed Mobility: Rolling Right;Rolling Left Rolling Right: Minimal Assistance - Patient > 75% Rolling Left: Minimal Assistance - Patient > 75% Right Sidelying to Sit: Minimal Assistance - Patient > 75%;Moderate Assistance - Patient 50-74% Supine to Sit: Minimal Assistance - Patient > 75% Transfers Transfers: Sit to Stand Sit to Stand: Minimal Assistance - Patient > 75% Locomotion :    Trunk/Postural Assessment : Postural Control Postural Control: Deficits on evaluation Trunk Control: Min A to maintian trunk control when seated unsupported Righting Reactions: delayed  Balance:   Exercises:   Other Treatments:      Therapy/Group: Individual Therapy  Alger Simons PT, DPT, CSRS 04/24/2022, 4:51 PM

## 2022-04-25 DIAGNOSIS — I10 Essential (primary) hypertension: Secondary | ICD-10-CM | POA: Diagnosis not present

## 2022-04-25 DIAGNOSIS — D631 Anemia in chronic kidney disease: Secondary | ICD-10-CM | POA: Diagnosis not present

## 2022-04-25 DIAGNOSIS — I639 Cerebral infarction, unspecified: Secondary | ICD-10-CM | POA: Diagnosis not present

## 2022-04-25 DIAGNOSIS — N185 Chronic kidney disease, stage 5: Secondary | ICD-10-CM | POA: Diagnosis not present

## 2022-04-25 NOTE — Progress Notes (Signed)
Speech Language Pathology Daily Session Note  Patient Details  Name: Gina Bailey MRN: [id number] Date of Birth: Jul 13, 1946  Today's Date: 04/25/2022 SLP Individual Time: 1015-1055 SLP Individual Time Calculation (min): 40 min  Short Term Goals: Week 1: SLP Short Term Goal 1 (Week 1): Patient will consume current diet without overt s/sx of aspiration and self monitoring of oral residuals and/orL  buccal stasis with sup A verbal cues SLP Short Term Goal 2 (Week 1): Patient will implement speech intelligiblity strategies at the conversation level with sup A verbal cues to achieve >90% intelligibility  Skilled Therapeutic Interventions: Skilled treatment session focused on communication goals. SLP facilitated session by providing total A for recall of her speech intelligibility strategies. Patient was not interested in participating in structured language tasks "especially after just eating" but was very interested in talking to the SLP about her family, hobbies, interests, etc. Throughout functional conversation, patient was ~90% intelligible with overall supervision level verbal cues for use of over-articulation and a slow rate. Patient left upright in the wheelchair with alarm on and all needs within reach. Continue with current plan of care.      Pain No/Denies Pain   Therapy/Group: Individual Therapy  Agam Tuohy 04/25/2022, 12:17 PM

## 2022-04-25 NOTE — Progress Notes (Signed)
Physical Therapy Session Note  Patient Details  Name: Gina Bailey MRN: [id number] Date of Birth: 16-Aug-1945  Today's Date: 04/25/2022 PT Individual Time: 0809-0850 PT Individual Time Calculation (min): 41 min   Short Term Goals: Week 1:  PT Short Term Goal 1 (Week 1): Pt will transfer sup <> sit w/ CGA PT Short Term Goal 2 (Week 1): Pt will transfer sit to stand w/ min A PT Short Term Goal 3 (Week 1): Pt willamb 100' w/ RW and min A PT Short Term Goal 4 (Week 1): Pt will assess curb height step w/ AD  Skilled Therapeutic Interventions/Progress Updates:  Patient seated upright in recliner on entrance to room. Reading yesterday's paper. Patient alert and agreeable to PT session after dressing, but slow to get started despite encouragement.   Patient with no pain complaint at start of session.  Therapeutic Activity: Transfers: Pt performed sit<>stand and stand pivot transfers throughout session with supervision/ CGA. Provided verbal cues for improved technique and proper use of RW. Pt dresses while seated at recliner. Dons bra and t-shirt with CGA to initiate and MinA to complete over LUE. Same for pants is able to initiate with CGA but MinA to complete pull up of pants on L side. Shoes donned MaxA for time.   Gait Training:  Pt ambulated 170' x1 using RW with close supervision on straight pathway. Demonstrated slow pace and min/ mod BUE pressure into walker. Provided vc/ tc for holding RW just in front of pt with good correction from pt. VC for obstacle on L side, but pt relates that she saw it was going to go around it with last minute maneuvering of RW to miss it.   Patient seated in recliner at end of session with brakes locked, belt alarm set, and all needs within reach. Provided with tissues and blanket on request.    Therapy Documentation Precautions:  Precautions Precautions: Fall Precaution Comments: impulsive, L inattention Restrictions Weight Bearing Restrictions: (P)  No General:   Vital Signs:  Pain: Pain Assessment Pain Scale: 0-10 Pain Score: 0-No pain  Therapy/Group: Individual Therapy  Alger Simons PT, DPT, CSRS 04/25/2022, 9:43 AM

## 2022-04-25 NOTE — Progress Notes (Signed)
PROGRESS NOTE   Subjective/Complaints: Sitting in chair this AM. No new concerns or complaints.   Review of Systems  Constitutional:  Negative for chills and fever.  HENT:  Negative for congestion.   Eyes:  Negative for double vision.  Respiratory:  Negative for cough.   Cardiovascular:  Negative for chest pain and palpitations.  Gastrointestinal:  Negative for abdominal pain, diarrhea, heartburn, nausea and vomiting.  Genitourinary: Negative.   Musculoskeletal: Negative.   Neurological:  Positive for weakness. Negative for dizziness.     Objective:   No results found. Recent Labs    04/22/22 1532 04/23/22 0512  WBC 7.7 8.6  HGB 8.0* 8.1*  HCT 25.9* 25.8*  PLT 313 299    Recent Labs    04/23/22 0512 04/24/22 0452  NA 138 139  K 5.0 4.8  CL 103 107  CO2 23 22  GLUCOSE 161* 196*  BUN 81* 83*  CREATININE 5.77* 5.44*  CALCIUM 8.9 8.8*     Intake/Output Summary (Last 24 hours) at 04/25/2022 1327 Last data filed at 04/25/2022 0754 Gross per 24 hour  Intake 594 ml  Output --  Net 594 ml         Physical Exam: Vital Signs Blood pressure (!) 141/76, pulse 72, temperature 98.3 F (36.8 C), temperature source Oral, resp. rate 18, height '5\' 6"'$  (1.676 m), weight 77 kg, last menstrual period 10/22/1978, SpO2 99 %.  General: No apparent distress, sitting in chair HEENT: PERRLA, conjugate gaze, sclera anicteric, oral mucosa pink and moist,   Neck: Supple without JVD or lymphadenopathy Heart: Reg rate and rhythm. No murmurs rubs or gallops Chest: CTA bilaterally without wheezes, rales, or rhonchi; no distress Abdomen: Soft, non-tender, non-distended, bowel sounds positive. Extremities: No clubbing, cyanosis, Pulses are 2+ Psych: Pt's affect is normal Skin: warm and dry Neuro:  Alert to person and date only, not able to say what brought her to the hospital, decreased memory, follows simple commands, CN 2-12  intact orther than L facial weakness,mild  L inattention, she has dysarthria Sensation intact to LT in all 4 extremities Strength 5/5 in b/l UE and RLE, strength at least 4-/5 in proximal LLE and at least 2/5 in distal LLE however she provides limited effort due to pain FTN intact b/l Musculoskeletal:  Trace edema, left GT, 1st metatarsal head TTP - improved  Assessment/Plan: 1. Functional deficits which require 3+ hours per day of interdisciplinary therapy in a comprehensive inpatient rehab setting. Physiatrist is providing close team supervision and 24 hour management of active medical problems listed below. Physiatrist and rehab team continue to assess barriers to discharge/monitor patient progress toward functional and medical goals  Care Tool:  Bathing    Body parts bathed by patient: Right arm, Left arm, Abdomen, Chest, Front perineal area, Buttocks, Left upper leg, Right upper leg, Right lower leg, Face   Body parts bathed by helper: Left lower leg     Bathing assist Assist Level: Minimal Assistance - Patient > 75%     Upper Body Dressing/Undressing Upper body dressing   What is the patient wearing?: Bra, Pull over shirt    Upper body assist Assist Level: Minimal Assistance -  Patient > 75%    Lower Body Dressing/Undressing Lower body dressing      What is the patient wearing?: Underwear/pull up, Pants     Lower body assist Assist for lower body dressing: Moderate Assistance - Patient 50 - 74%     Toileting Toileting    Toileting assist Assist for toileting: Minimal Assistance - Patient > 75%     Transfers Chair/bed transfer  Transfers assist     Chair/bed transfer assist level: Minimal Assistance - Patient > 75%     Locomotion Ambulation   Ambulation assist      Assist level: Moderate Assistance - Patient 50 - 74% Assistive device: Walker-rolling Max distance: 90   Walk 10 feet activity   Assist     Assist level: Moderate Assistance -  Patient - 50 - 74% Assistive device: Walker-rolling   Walk 50 feet activity   Assist    Assist level: Moderate Assistance - Patient - 50 - 74% Assistive device: Walker-rolling    Walk 150 feet activity   Assist Walk 150 feet activity did not occur: Safety/medical concerns         Walk 10 feet on uneven surface  activity   Assist Walk 10 feet on uneven surfaces activity did not occur: Safety/medical concerns         Wheelchair     Assist Is the patient using a wheelchair?: Yes Type of Wheelchair: Manual    Wheelchair assist level: Minimal Assistance - Patient > 75% Max wheelchair distance: 90    Wheelchair 50 feet with 2 turns activity    Assist        Assist Level: Minimal Assistance - Patient > 75%   Wheelchair 150 feet activity     Assist      Assist Level: Moderate Assistance - Patient 50 - 74%   Blood pressure (!) 141/76, pulse 72, temperature 98.3 F (36.8 C), temperature source Oral, resp. rate 18, height '5\' 6"'$  (1.676 m), weight 77 kg, last menstrual period 10/22/1978, SpO2 99 %.  Medical Problem List and Plan: 1. 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  Antithrombotics: -DVT/anticoagulation:  Pharmaceutical: Heparin             -antiplatelet therapy: aspirin 81 mg and Plavix for 3 weeks then aspirin alone 3. Pain Management: Tylenol as needed 4. Mood/Behavior/Sleep: LCSW to evaluate and provide emotional support             -antipsychotic agents: none 5. Neuropsych/cognition: This patient does capable of making decisions on her own behalf. 6. Skin/Wound Care: Routine skin care checks 7. 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 Glucose stable on last BMP 10: Chronic BLE edema: minimal currently -continue Lasix 80 mg q AM (40 mg q PM discontinued)             -continue compression stockings when OOB 11: Hypertension: monitor             -continue amlodipine 10 mg daily             -continue carvedilol 12.5 mg BID             -continue Lasix  -10/7 Well controlled overall 12: Diabetic neuropathy both feet: follows with podiatry 13: Hyperlipidemia: Crestor 20 mg daily 14: Anemia of chronic renal failure: received 4 transfusion of IV Ferrlecit; follow-up CBC  -10/5 HGB stable at 8/1 recheck monday 15: Gout flare: prednisone for 3 days 16: Obesity: dietary counseling 17: Bilateral carotid artery stenosis: outpatient surveillance 18. Dysphagia             -SLP eval, dys 3 diet with thin liquids  -10/6 appears to be eating 80-100% meals 19. Hypermagnesemia  -Mild asymptomatic,  avoid Mg supplements, recheck monday    LOS: 3 days A FACE TO FACE EVALUATION WAS PERFORMED  Jennye Boroughs 04/25/2022, 1:27 PM

## 2022-04-25 NOTE — Progress Notes (Signed)
Physical Therapy Session Note  Patient Details  Name: Gina Bailey MRN: [id number] Date of Birth: May 25, 1946  Today's Date: 04/25/2022 PT Individual Time: 1530-1630 PT Individual Time Calculation (min): 60 min   Short Term Goals: Week 1:  PT Short Term Goal 1 (Week 1): Pt will transfer sup <> sit w/ CGA PT Short Term Goal 2 (Week 1): Pt will transfer sit to stand w/ min A PT Short Term Goal 3 (Week 1): Pt willamb 100' w/ RW and min A PT Short Term Goal 4 (Week 1): Pt will assess curb height step w/ AD Week 2:     Skilled Therapeutic Interventions/Progress Updates:   Pt received sitting in WC and agreeable to PT. Toilet transfers with RW. Pt transported to entrance of [place]. Gait training with RW over unlevel cement sidewalk and through atrium of hospital >1563f. Cues for posture and attention to task intermittently.  Patient returned to room and performed stand pivot to recliner with supervision assist and no AD. Pt left sitting in recliner with call bell in reach and all needs met.           Therapy Documentation Precautions:  Precautions Precautions: Fall Precaution Comments: impulsive, L inattention Restrictions Weight Bearing Restrictions: (P) No  Vital Signs: Therapy Vitals Temp: 98.2 F (36.8 C) Temp Source: Oral Pulse Rate: 74 Resp: 18 BP: 119/66 Patient Position (if appropriate): Sitting Oxygen Therapy SpO2: 100 % O2 Device: Room Air Pain: denies    Therapy/Group: Individual Therapy  ALorie Phenix10/01/2022, 4:35 PM

## 2022-04-25 NOTE — Progress Notes (Signed)
Occupational Therapy Session Note  Patient Details  Name: Gina Bailey MRN: [id number] Date of Birth: 26-Jul-1945  Today's Date: 04/25/2022 OT Individual Time: 1300-1345 OT Individual Time Calculation (min): 45 min    Short Term Goals: Week 1:  OT Short Term Goal 1 (Week 1): Pt will locate items on left side with min cues OT Short Term Goal 2 (Week 1): Pt will be able to comlete sit to stands with supervision OT Short Term Goal 3 (Week 1): Pt will be able to complete stand pivot transfer to toilet with CGA  Skilled Therapeutic Interventions/Progress Updates:    Upon OT arrival, pt seated in recliner reporting no pain and is agreeable to OT treatment. Treatment intervention with a focus on self care retraining, functional mobility, and UE strengthening. Pt requesting to finish her lunch and was able to do so independently. Pt completes sit to stand transfer with CGA using RW and ambulates to sink with CGA. Pt completes oral care while standing at sink with RW and CGA. Increased time required ot complete. Pt stands with CGA and RW to doff cardigan. Pt performs functional mobility with RW and CGA requiring verbal cues to correct posture. Pt able to ambulate to ortho gym and completes stand to sit transfer with CGA to EOM. Pt sits EOM with Supervision and performs B UE exercises with 3lb dumbbell for 2x10 reps. Pt completes shoulder flex/ext, shoulder abd/add, scapular protr/retr, elbow flex/ext, supina/prona, wrist flex/ext. Pt requires verbal cues for correct form and to attend to the L side during exercises as pt tends to have a R gaze. Pt tolerates exercises well and has no LOB. Pt completes sit to stand transfer with CGA using RW and ambulates back to her room with CGA. Pt returns to recliner performing stand to sit transfer with CGA. [name] was donned by therapist secondary to time constraints. Pt was left in recliner at end of session with all needs met and safety measures in place.   Therapy  Documentation Precautions:  Precautions Precautions: Fall Precaution Comments: impulsive, L inattention Restrictions Weight Bearing Restrictions: (P) No    Therapy/Group: Individual Therapy  Marvetta Gibbons 04/25/2022, 1:39 PM

## 2022-04-26 NOTE — IPOC Note (Signed)
Overall Plan of Care [facility]) Patient Details Name: Gina Bailey MRN: [id number] DOB: [birth year]  Admitting Diagnosis: Acute ischemic stroke [facility] : [facility])  Hospital Problems: Principal Problem:   Acute ischemic stroke [facility]) Active Problems:   Dysphagia   Anemia     Functional Problem List: Nursing Bladder, Medication Management, Safety, Edema, Endurance  PT Balance, Safety, Endurance, Motor  OT Balance, Cognition, Motor, Endurance, Perception, Safety, Sensory, Skin Integrity, Vision  SLP Motor, Nutrition  TR         Basic ADL's: OT Bathing, Dressing, Toileting, Grooming     Advanced  ADL's: OT       Transfers: PT Bed Mobility, Bed to Chair, Car, Manufacturing systems engineer, Metallurgist: PT Ambulation, Emergency planning/management officer, Stairs     Additional Impairments: OT Fuctional Use of Upper Extremity  SLP Swallowing, Communication expression    TR      Anticipated Outcomes Item Anticipated Outcome  Self Feeding n/a  Swallowing  sup A   Basic self-care  Supervision  Estate agent Transfers Supervision  Bowel/Bladder  manage bladder w toileting  Transfers  mod I  Locomotion  supervision w/ AD  Communication  sup A  Cognition     Pain  n/a  Safety/Judgment  manage safety w cues   Therapy Plan: PT Intensity: Minimum of 1-2 x/day ,45 to 90 minutes PT Frequency: 5 out of 7 days PT Duration Estimated Length of Stay: 2 weeks. OT Intensity: Minimum of 1-2 x/day, 45 to 90 minutes OT Frequency: 5 out of 7 days OT Duration/Estimated Length of Stay: 10-14 days SLP Intensity: Minumum of 1-2 x/day, 30 to 90 minutes SLP Frequency: 1 to 3 out of 7 days SLP Duration/Estimated Length of Stay: 10-14 days   Team Interventions: Nursing Interventions Disease Management/Prevention, Medication Management, Discharge Planning, Patient/Family Education  PT interventions Ambulation/gait training, Discharge planning, Functional mobility training,  Therapeutic Activities, UE/LE Strength taining/ROM, Community reintegration, Training and development officer, Barrister's clerk education, Neuromuscular re-education, IT trainer, Therapeutic Exercise, UE/LE Coordination activities, Wheelchair propulsion/positioning  OT Interventions Training and development officer, Discharge planning, Self Care/advanced ADL retraining, Therapeutic Activities, UE/LE Coordination activities, Cognitive remediation/compensation, Functional mobility training, Patient/family education, Skin care/wound managment, Therapeutic Exercise, Visual/perceptual remediation/compensation, Wheelchair propulsion/positioning, UE/LE Strength taining/ROM, Psychosocial support, Neuromuscular re-education, DME/adaptive equipment instruction, Community reintegration  SLP Interventions Dysphagia/aspiration precaution training, Functional tasks, Patient/family education, Speech/Language facilitation, Therapeutic Activities  TR Interventions    SW/CM Interventions Discharge Planning, Psychosocial Support, Patient/Family Education, Disease Management/Prevention   Barriers to Discharge MD  Medical stability, Home enviroment access/loayout, and Nutritional means  Nursing Decreased caregiver support 1 level/1 ste w son/DIL  PT Inaccessible home environment, Lack of/limited family support STE w/o rails  OT      SLP      SW Insurance for SNF coverage     Team Discharge Planning: Destination: PT-Home ,OT- Home , SLP-Home Projected Follow-up: PT-Outpatient PT, OT-  24 hour supervision/assistance, Home health OT, SLP-24 hour supervision/assistance Projected Equipment Needs: PT-To be determined, OT- To be determined, SLP-None recommended by SLP Equipment Details: PT-probably RW, OT-  Patient/family involved in discharge planning: PT- Patient, Family member/caregiver,  OT-Patient, SLP-Patient, Family member/caregiver  MD ELOS: 7-10 days Medical Rehab Prognosis:  Excellent Assessment: The patient has  been admitted for CIR therapies with the diagnosis of right posterior limb internal capsule as well as 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 The team will be addressing functional mobility, strength, stamina, balance, safety, adaptive techniques and equipment, self-care, bowel  and bladder mgt, patient and caregiver education. Goals have been set at supervision goals with PT/OT/SLP. Anticipated discharge destination is Home.        See Team Conference Notes for weekly updates to the plan of care

## 2022-04-27 ENCOUNTER — Inpatient Hospital Stay (HOSPITAL_COMMUNITY): Payer: Medicare PPO

## 2022-04-27 DIAGNOSIS — I639 Cerebral infarction, unspecified: Secondary | ICD-10-CM | POA: Diagnosis not present

## 2022-04-27 LAB — CBC
HCT: 27.1 % — ABNORMAL LOW (ref 36.0–46.0)
Hemoglobin: 8.6 g/dL — ABNORMAL LOW (ref 12.0–15.0)
MCH: 27 pg (ref 26.0–34.0)
MCHC: 31.7 g/dL (ref 30.0–36.0)
MCV: 85 fL (ref 80.0–100.0)
Platelets: 344 10*3/uL (ref 150–400)
RBC: 3.19 MIL/uL — ABNORMAL LOW (ref 3.87–5.11)
RDW: 17.2 % — ABNORMAL HIGH (ref 11.5–15.5)
WBC: 9.9 10*3/uL (ref 4.0–10.5)
nRBC: 0 % (ref 0.0–0.2)

## 2022-04-27 LAB — GLUCOSE, CAPILLARY: Glucose-Capillary: 152 mg/dL — ABNORMAL HIGH (ref 70–99)

## 2022-04-27 LAB — BASIC METABOLIC PANEL
Anion gap: 11 (ref 5–15)
BUN: 107 mg/dL — ABNORMAL HIGH (ref 8–23)
CO2: 26 mmol/L (ref 22–32)
Calcium: 8.8 mg/dL — ABNORMAL LOW (ref 8.9–10.3)
Chloride: 105 mmol/L (ref 98–111)
Creatinine, Ser: 5.55 mg/dL — ABNORMAL HIGH (ref 0.44–1.00)
GFR, Estimated: 7 mL/min — ABNORMAL LOW (ref >60)
Glucose, Bld: 109 mg/dL — ABNORMAL HIGH (ref 70–99)
Potassium: 4.4 mmol/L (ref 3.5–5.1)
Sodium: 142 mmol/L (ref 135–145)

## 2022-04-27 LAB — MAGNESIUM: Magnesium: 2.9 mg/dL — ABNORMAL HIGH (ref 1.7–2.4)

## 2022-04-27 NOTE — Consult Note (Signed)
Neurology Consultation  Reason for Consult: sudden onset aphasia, dysarthria and left sided weakness Referring Physician: Dr. Letta Pate  CC: none  History is obtained from: chart, nurse and patient  HPI: Gina Bailey is a 76 y.o. female with history of CKD, DM, HTN, HLD and recent acute lacunar strokes in right lentiform nucleus and left posterior occipital horn who is undergoing rehabilitation for her strokes.  She was last known to be normal at 0750 when her nurse gave her medications this morning.  At 1007, her nurse noted that she had aphasia, severe dysarthria and left sided weakness.  Deficits rapidly improved with patient becoming able to speak with improving dysarthria in minutes and perseverating on needing to have a bowel movement.  Cooperation with exam was limited due to this perseveration.  Left sided weakness improved quickly as well with only some left leg drift noted on exam.  LKW: 0750 TNK given?: no, recent stroke IR Thrombectomy? No, exam not consistent with an LVO and baseline MRS Modified Rankin Scale: 4-Needs assistance to walk and tend to bodily needs  ROS:  Unable to obtain due to altered mental status.   Past Medical History:  Diagnosis Date   Chronic kidney disease    Diabetes mellitus    Diet-controlled. Not on antiglycemic medications.   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  Based on the size, patient's age, status as a former smoker, and its discovery in a community setting, her probability of malignancy is 3% (low probability).  Based on this, I recommend follow-up with a CT scan around 05/2014, and if lesion is stable or smaller, no further follow-up is needed.  CT Chest 06/2015: Stable 54m pulmonary nodule. No    Ventral hernia    Incisional ventral hernia. S/P repair by Dr. 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 WHulen Skains (08/2006)     Family History  Problem Relation Age of Onset   Diabetes Other        1st degree reative in 3 of her siblings   Breast cancer Mother        not sure of age     Social History:   reports that she quit smoking about 22 years ago. Her smoking use included cigarettes. She has a 0.30 pack-year smoking history. She has never used smokeless tobacco. She reports that she does not drink alcohol and does not use drugs.  Medications  Current Facility-Administered Medications:    acetaminophen (TYLENOL) tablet 325-650 mg, 325-650 mg, Oral, Q4H PRN, Setzer, SEdman Circle PA-C   amLODipine (NORVASC) tablet 10 mg, 10 mg, Oral, Daily, Setzer, SEdman Circle PA-C, 10 mg at 04/27/22 0750   aspirin EC tablet 81 mg, 81 mg, Oral, Daily, SBarbie Banner PA-C, 81 mg at 04/27/22 0750   calcitRIOL (ROCALTROL) capsule 0.25 mcg, 0.25 mcg, Oral, Once per day on Mon Wed Fri, Setzer, Sandra J, PA-C, 0.25 mcg at 04/27/22 1050   carvedilol (COREG) tablet 12.5 mg, 12.5 mg, Oral, BID WC, Setzer, SEdman Circle PA-C, 12.5 mg at 04/27/22 0750  clopidogrel (PLAVIX) tablet 75 mg, 75 mg, Oral, Daily, Barbie Banner, PA-C, 75 mg at 04/27/22 0750   diphenhydrAMINE (BENADRYL) 12.5 MG/5ML elixir 12.5-25 mg, 12.5-25 mg, Oral, Q6H PRN, Vaughan Basta, Sandra J, PA-C   furosemide (LASIX) tablet 80 mg, 80 mg, Oral, Daily, Barbie Banner, PA-C, 80 mg at 04/27/22 0750   guaiFENesin-dextromethorphan (ROBITUSSIN DM) 100-10 MG/5ML syrup 5-10 mL, 5-10 mL, Oral, Q6H PRN, Setzer, Sandra J, PA-C   methocarbamol (ROBAXIN) tablet 500 mg, 500 mg, Oral, Q6H PRN, Setzer, Edman Circle, PA-C   prochlorperazine (COMPAZINE) tablet 5-10 mg, 5-10 mg, Oral, Q6H PRN **OR** prochlorperazine (COMPAZINE)  injection 5-10 mg, 5-10 mg, Intramuscular, Q6H PRN **OR** prochlorperazine (COMPAZINE) suppository 12.5 mg, 12.5 mg, Rectal, Q6H PRN, Setzer, Sandra J, PA-C   rosuvastatin (CRESTOR) tablet 20 mg, 20 mg, Oral, Daily, Setzer, Sandra J, PA-C, 20 mg at 04/27/22 0750   senna-docusate (Senokot-S) tablet 1 tablet, 1 tablet, Oral, QHS PRN, Setzer, Edman Circle, PA-C   sodium phosphate (FLEET) 7-19 GM/118ML enema 1 enema, 1 enema, Rectal, Once PRN, Setzer, Edman Circle, PA-C   sorbitol 70 % solution 30 mL, 30 mL, Oral, Daily PRN, Setzer, Sandra J, PA-C   traZODone (DESYREL) tablet 25-50 mg, 25-50 mg, Oral, QHS PRN, Vaughan Basta, Edman Circle, PA-C   Exam: Current vital signs: BP (!) 143/64   Pulse 63   Temp 98.4 F (36.9 C)   Resp 16   Ht '5\' 6"'$  (1.676 m)   Wt 77 kg   LMP 10/22/1978   SpO2 100%   BMI 27.40 kg/m  Vital signs in last 24 hours: Temp:  [97.9 F (36.6 C)-98.4 F (36.9 C)] 98.4 F (36.9 C) (10/09 1005) Pulse Rate:  [62-66] 63 (10/09 0750) Resp:  [16-18] 16 (10/09 1005) BP: (111-143)/(52-67) 143/64 (10/09 1005) SpO2:  [99 %-100 %] 100 % (10/09 1005)  GENERAL: Awake, alert, in no acute distress Psych: Affect appropriate for situation, patient is slightly agitated and perseverates on needing to have a bowel movement Head: Normocephalic and atraumatic, without obvious abnormality EENT: Normal conjunctivae, moist mucous membranes, no OP obstruction LUNGS: Normal respiratory effort. Non-labored breathing on room air Extremities: warm, well perfused, without obvious deformity  NEURO:  Mental Status: Awake, alert, and oriented to person and place, disoriented to time and situation  She is not able to provide a clear and coherent history of present illness. Speech/Language: speech is slightly dysarthric.   Naming, repetition, fluency, and comprehension intact without aphasia  No neglect is noted Cranial Nerves:  II: PERRL visual fields full.  III, IV, VI: EOMI. Lid elevation symmetric and full.   V: Sensation is intact to light touch and symmetrical to face.  VII: Slight left facial droop VIII: Hearing intact to voice IX, X: Voice is dysarthric  XII: Tongue protrudes midline without fasciculations.   Motor: 5/5 strength to BUE and RLE, 4+/5 to LLE Tone is normal. Bulk is normal.  Sensation: Intact to light touch bilaterally in all four extremities. No extinction to DSS present.  Coordination: FTN intact bilaterally. HKS intact bilaterally. No pronator drift. Drift in LLE Gait: Deferred  NIHSS: 1a Level of Conscious.: 0 1b LOC Questions: 1 1c LOC Commands: 0 2 Best Gaze: 0 3 Visual: 0 4 Facial Palsy: 1 5a Motor Arm - left: 0 5b Motor Arm - Right: 0 6a Motor Leg - Left: 2 6b Motor Leg - Right: 0 7 Limb Ataxia: 0 8 Sensory: 0 9 Best Language: 0 10 Dysarthria: 1 11 Extinct. and Inatten.: 0  TOTAL: 5  Labs I have reviewed labs in epic and the results pertinent to this consultation are:   CBC    Component Value Date/Time   WBC 9.9 04/27/2022 0612   RBC 3.19 (L) 04/27/2022 0612   HGB 8.6 (L) 04/27/2022 0612   HGB 13.0 11/27/2015 1400   HCT 27.1 (L) 04/27/2022 0612   HCT 40.4 11/27/2015 1400   PLT 344 04/27/2022 0612   PLT 254 11/27/2015 1400   MCV 85.0 04/27/2022 0612   MCV 85 11/27/2015 1400   MCH 27.0 04/27/2022 0612   MCHC 31.7 04/27/2022 0612   RDW 17.2 (H) 04/27/2022 0612   RDW 14.9 11/27/2015 1400   LYMPHSABS 0.8 04/23/2022 0512   MONOABS 0.3 04/23/2022 0512   EOSABS 0.0 04/23/2022 0512   BASOSABS 0.0 04/23/2022 0512    CMP     Component Value Date/Time   NA 142 04/27/2022 0612   NA 146 (H) 04/09/2022 0945   K 4.4 04/27/2022 0612   CL 105 04/27/2022 0612   CO2 26 04/27/2022 0612   GLUCOSE 109 (H) 04/27/2022 0612   BUN 107 (H) 04/27/2022 0612   BUN 58 (H) 04/09/2022 0945   CREATININE 5.55 (H) 04/27/2022 0612   CREATININE 1.09 11/20/2014 1636   CALCIUM 8.8 (L) 04/27/2022 0612   PROT 6.3 (L) 04/23/2022 0512   PROT 7.2 08/19/2020 1021    ALBUMIN 2.8 (L) 04/23/2022 0512   ALBUMIN 4.1 08/19/2020 1021   AST 27 04/23/2022 0512   ALT 37 04/23/2022 0512   ALKPHOS 48 04/23/2022 0512   BILITOT 0.3 04/23/2022 0512   BILITOT 0.3 08/19/2020 1021   GFRNONAA 7 (L) 04/27/2022 0612   GFRNONAA 52 (L) 11/20/2014 1636   GFRAA 32 (L) 08/19/2020 1021   GFRAA 59 (L) 11/20/2014 1636    Lipid Panel     Component Value Date/Time   CHOL 223 (H) 04/09/2022 0945   TRIG 58 04/09/2022 0945   HDL 78 04/09/2022 0945   CHOLHDL 2.9 04/09/2022 0945   CHOLHDL 2.3 05/22/2014 1635   VLDL 12 05/22/2014 1635   LDLCALC 135 (H) 04/09/2022 0945     Imaging I have reviewed the images obtained:  CT-scan of the brain  MRI examination of the brain p[ending  MRA brain pending  Assessment: 76 year old patient with HTN, HLD, CKD, HLD, HTN and recent strokes in the right lentiform nucleus and left posterior occipital horn presents today with sudden onset left sided weakness, aphasia and dysarthria.  Aphasia and left sided weakness improved quickly with dysarthria resolving more slowly.  CT head reveals no acute abnormalities.  Will obtain MRI/MRA brain to determine if progression of existing strokes or new strokes have occurred.    Impression:Possible expansion or stuttering of recent lacunar strokes in patient with multiple stroke risk factors and chronic microvascular ischemic disease  Recommendations: - Permissive HTN x48 hrs from sx onset or until stroke ruled out by MRI goal BP <220/110. PRN labetalol or hydralazine if BP above these parameters. Avoid oral antihypertensives. - MRI brain wo contrast - MRA brain - continue statin per guidelines - continue aspirin and clopidogrel antiplt/anticoag - q8 hr neuro checks - STAT head CT for any change in neuro exam, and activate code stroke - Given no telemetry available on rehab floor, please reach out to cardiology for 30-day event monitor - Continue PT/OT/SLP - Amb referral to neurology upon  discharge already placed on her recent discharge  Pt seen by NP/Neuro and later by MD. Note/plan  to be edited by MD as needed.  [place] , MSN, AGACNP-BC [facility] See Amion for schedule and pager information 04/27/2022 10:52 AM  NP note above, reviewed and clarified as needed by myself  MRI brain and MRA head personally reviewed, agree with radiology 1. 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 2. Slightly increased size of recent infarct in the posterior limb of the right internal capsule. 3. Extensive chronic small vessel ischemic disease. 4. Motion degraded head MRA without evidence of a large vessel occlusion or significant proximal stenosis.  In addition to expansion of the prior stroke as I suspected which is not atypical for lacunar stroke, she does have a new infarct as well with her prior MRI additionally demonstrating some left-sided infarcts.  This pattern is concerning for central embolic source and therefore would recommend event monitoring for occult atrial fibrillation.  Otherwise she is optimized from a stroke risk perspective.  Attending Neurologist's note:  I personally saw this patient, gathering history, performing a code stroke NIH examination, reviewing relevant labs, personally reviewing relevant imaging including head CT and MRI brain, and formulated the assessment and plan, adding the note above for completeness and clarity to accurately reflect my thoughts

## 2022-04-27 NOTE — Progress Notes (Signed)
Physical Therapy Session Note  Patient Details  Name: Gina Bailey MRN: [id number] Date of Birth: 07-Oct-1945  Today's Date: 04/27/2022 PT Individual Time: 0803-0902 PT Individual Time Calculation (min): 59 min   Short Term Goals: Week 1:  PT Short Term Goal 1 (Week 1): Pt will transfer sup <> sit w/ CGA PT Short Term Goal 2 (Week 1): Pt will transfer sit to stand w/ min A PT Short Term Goal 3 (Week 1): Pt willamb 100' w/ RW and min A PT Short Term Goal 4 (Week 1): Pt will assess curb height step w/ AD   Skilled Therapeutic Interventions/Progress Updates:  Patient seated upright in recliner on entrance to room. Patient alert and agreeable to PT session. In good spirits. Declines need to toilet. MD rounding at start of session.  Patient with no pain complaint at start of session.  Therapeutic Activity: Transfers: Pt performed sit<>stand and stand pivot transfers throughout session with supervision. Pt allowed to fail when placing BUE on RW instead of pushing from armrests on chair.  Provided verbal cues for hand placement during transfers in order to decrease overall effort. Pt chooses clothing from duffel bag for the day and is able to stand with good static balance to pull up pants from floor level with RUE and then using both hands once reaching knees. Max A for donning of brief. Requires ModA to complete d/t decreased strength in L pinch grasp. Mod/ MaxA for bra and min A for t-shirt.   Gait Training:  In room, pt ambulates to sink from recliner and stands throughout brushing of teeth. Is able to complete steps to prep toothbrush and toothpaste as well as water cup. No LOB throughout.   Pt ambulated nearly 400 ft using RW with close supervision/ CGA. Demos slow pace with flexed forward posture and increased pressure into RW. With fatigue pt allows RW to extend forward. Provided vc/ tc for upright posture, visual scan of pathway, maintaining proximity to RW.  Patient seated upright in  recliner at end of session with brakes locked, belt alarm set, call bell beside pt, and tray table with all needs within reach.    Therapy Documentation Precautions:  Precautions Precautions: Fall Precaution Comments: impulsive, L inattention Restrictions Weight Bearing Restrictions: (P) No General:   Vital Signs: Therapy Vitals Temp: 98.4 F (36.9 C) Pulse Rate: 63 Resp: 16 BP: (!) 143/64 Oxygen Therapy SpO2: 100 % O2 Device: Room Air Pain:  No pain related this session.   Therapy/Group: Individual Therapy  Alger Simons PT, DPT, CSRS 04/27/2022, 10:12 AM

## 2022-04-27 NOTE — Progress Notes (Signed)
Held BP meds per Dr Letta Pate

## 2022-04-27 NOTE — Progress Notes (Addendum)
PROGRESS NOTE   Subjective/Complaints:  MS changes and increased weakness  when up this am with therapy  , Sys BP recorded as 113mHg at the time, code stroke called  Pt back to baseline with speech according to neurology and weakness has improved to baseline   Review of Systems  Constitutional:  Negative for chills and fever.  HENT:  Negative for congestion.   Eyes:  Negative for double vision.  Respiratory:  Negative for cough.   Cardiovascular:  Negative for chest pain and palpitations.  Gastrointestinal:  Negative for abdominal pain, diarrhea, heartburn, nausea and vomiting.  Genitourinary: Negative.   Musculoskeletal: Negative.   Neurological:  Positive for weakness. Negative for dizziness.     Objective:   No results found. Recent Labs    04/27/22 0612  WBC 9.9  HGB 8.6*  HCT 27.1*  PLT 344    Recent Labs    04/27/22 0612  NA 142  K 4.4  CL 105  CO2 26  GLUCOSE 109*  BUN 107*  CREATININE 5.55*  CALCIUM 8.8*     Intake/Output Summary (Last 24 hours) at 04/27/2022 [id number] data filed at 04/27/2022 0700 Gross per 24 hour  Intake 1200 ml  Output --  Net 1200 ml         Physical Exam: Vital Signs Blood pressure 125/60, pulse 63, temperature 97.9 F (36.6 C), temperature source Oral, resp. rate 18, height '5\' 6"'$  (1.676 m), weight 77 kg, last menstrual period 10/22/1978, SpO2 100 %.   General: No acute distress Mood and affect are appropriate Heart: Regular rate and rhythm no rubs murmurs or extra sounds Lungs: Clear to auscultation, breathing unlabored, no rales or wheezes Abdomen: Positive bowel sounds, soft nontender to palpation, nondistended Extremities: No clubbing, cyanosis, or edema Skin: No evidence of breakdown, no evidence of rash   Neuro:  Alert to person and date only, not able to say what brought her to the hospital, decreased memory, follows simple commands, CN 2-12 intact  orther than L facial weakness,mild  L inattention, she has dysarthria Sensation intact to LT in all 4 extremities Strength 5/5 in b/l UE and RLE, strength at least 4-/5 in proximal LLE and at least 2/5 in distal LLE  FTN intact b/l   Assessment/Plan: 1. Functional deficits which require 3+ hours per day of interdisciplinary therapy in a comprehensive inpatient rehab setting. Physiatrist is providing close team supervision and 24 hour management of active medical problems listed below. Physiatrist and rehab team continue to assess barriers to discharge/monitor patient progress toward functional and medical goals  Care Tool:  Bathing    Body parts bathed by patient: Right arm, Left arm, Abdomen, Chest, Front perineal area, Buttocks, Left upper leg, Right upper leg, Right lower leg, Face   Body parts bathed by helper: Left lower leg     Bathing assist Assist Level: Minimal Assistance - Patient > 75%     Upper Body Dressing/Undressing Upper body dressing   What is the patient wearing?: Button up shirt    Upper body assist Assist Level: Contact Guard/Touching assist    Lower Body Dressing/Undressing Lower body dressing      What is  the patient wearing?: Underwear/pull up, Pants     Lower body assist Assist for lower body dressing: Moderate Assistance - Patient 50 - 74%     Toileting Toileting    Toileting assist Assist for toileting: Minimal Assistance - Patient > 75%     Transfers Chair/bed transfer  Transfers assist     Chair/bed transfer assist level: Minimal Assistance - Patient > 75%     Locomotion Ambulation   Ambulation assist      Assist level: Moderate Assistance - Patient 50 - 74% Assistive device: Walker-rolling Max distance: 90   Walk 10 feet activity   Assist     Assist level: Moderate Assistance - Patient - 50 - 74% Assistive device: Walker-rolling   Walk 50 feet activity   Assist    Assist level: Moderate Assistance - Patient -  50 - 74% Assistive device: Walker-rolling    Walk 150 feet activity   Assist Walk 150 feet activity did not occur: Safety/medical concerns         Walk 10 feet on uneven surface  activity   Assist Walk 10 feet on uneven surfaces activity did not occur: Safety/medical concerns         Wheelchair     Assist Is the patient using a wheelchair?: Yes Type of Wheelchair: Manual    Wheelchair assist level: Minimal Assistance - Patient > 75% Max wheelchair distance: 90    Wheelchair 50 feet with 2 turns activity    Assist        Assist Level: Minimal Assistance - Patient > 75%   Wheelchair 150 feet activity     Assist      Assist Level: Moderate Assistance - Patient 50 - 74%   Blood pressure 125/60, pulse 63, temperature 97.9 F (36.6 C), temperature source Oral, resp. rate 18, height '5\' 6"'$  (1.676 m), weight 77 kg, last menstrual period 10/22/1978, SpO2 100 %.  Medical Problem List and Plan: 1. Functional deficits secondary to right posterior limb internal capsule as well as left tiny periventricular white matter lacunar infarcts with dysarthria  Temporary worsening of deficits now resolved , likely hypoperfusion , reduced BP will adjust meds,  Repeat CT no acute changes, repeat MRI brain is pending              -patient may shower             -ELOS/Goals: 7-10 days             -bedrest except for MRI until tomorrow  2.  Antithrombotics: -DVT/anticoagulation:  Pharmaceutical: Heparin- will d/c amb >1500' in PT             -antiplatelet therapy: aspirin 81 mg and Plavix for 3 weeks (until 05/07/22) then aspirin alone 3. Pain Management: Tylenol as needed 4. Mood/Behavior/Sleep: LCSW to evaluate and provide emotional support             -antipsychotic agents: none 5. Neuropsych/cognition: This patient does capable of making decisions on her own behalf. 6. Skin/Wound Care: Routine skin care checks 7. 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 Glucose stable on last BMP 10: Chronic BLE edema: minimal currently -continue Lasix 80 mg q AM (40 mg q PM discontinued)             -continue compression stockings when OOB 11: Hypertension: monitor             -continue amlodipine 10 mg daily- reduce to '5mg'$  daily starting tomorrow              -continue carvedilol 12.5 mg BID- reduce to 6.'25mg'$  BID starting tomorrow              -continue Lasix   Vitals:   04/27/22 0750 04/27/22 1005  BP: 125/60 (!) 143/64  Pulse: 63   Resp:  16  Temp:  98.4 F (36.9 C)  SpO2:  100%    12: Diabetic neuropathy both feet: follows with podiatry 13: Hyperlipidemia: Crestor 20 mg daily 14: Anemia of chronic renal failure: received 4 transfusion of IV Ferrlecit; follow-up CBC  -10/9 HGB stable at 8.9    Latest Ref Rng & Units 04/27/2022    6:12 AM 04/23/2022    5:12 AM 04/22/2022    3:32 PM  CBC  WBC 4.0 - 10.5 K/uL 9.9  8.6  7.7   Hemoglobin 12.0 - 15.0 g/dL 8.6  8.1  8.0   Hematocrit 36.0 - 46.0 % 27.1  25.8  25.9   Platelets 150 - 400 K/uL 344  299  313     15: Gout flare: prednisone for 3 days 16: Obesity: dietary counseling 17: Bilateral carotid artery stenosis: outpatient surveillance 18. Dysphagia             -SLP eval, dys 3 diet with thin liquids  Eating well  19. Hypermagnesemia  -Mild asymptomatic,  avoid Mg supplements, recheck monday    LOS: 5 days A FACE TO FACE EVALUATION WAS PERFORMED  Charlett Blake 04/27/2022, 8:12 AM

## 2022-04-27 NOTE — Progress Notes (Signed)
Occupational Therapy Session Note  Patient Details  Name: Gina Bailey MRN: [id number] Date of Birth: Dec 23, 1945  Today's Date: 04/27/2022 OT Individual Time: 1122-1207 OT Individual Time Calculation (min): 45 min    Short Term Goals: Week 1:  OT Short Term Goal 1 (Week 1): Pt will locate items on left side with min cues OT Short Term Goal 2 (Week 1): Pt will be able to comlete sit to stands with supervision OT Short Term Goal 3 (Week 1): Pt will be able to complete stand pivot transfer to toilet with CGA  Skilled Therapeutic Interventions/Progress Updates:    Pt received in bed alert and talkative. Pt eager to wash up her lower body as she felt "dirty".  Pt requested to shower.  Covered IV site with plastic.  Pt sat to EOB with S and then took time picking out new clothign to wear after her shower.  she then ambulated with CGA to shower. Doffed clothing and sat on seat. Pt stated "oh no I need to get on the toilet".  Pt stood and had been incontinent of bowel on seat (brief also wet with urine).   Pt transferred to toilet.  Pt able to void more but needed to sit to finish voiding.  At this time, MRI transport arrived but pt on the toilet voiding.  Told them to come back in 5-10 minutes.  Pt able to stand up and hold balance with light support from L hand on bar to use R hand to cleanse bottom and entire perineal area. This took several minutes and lots of cloths as pt was soiled. She was able to adequately cleanse herself and sat back to don pants with min A over feet as pants had elastic cuffs on ankles.  Pt then ambulated back to bed to prepare for MRI transport return. Pt able to adjust self in bed.  Bed alarm on and all needs met.  Therapy Documentation Precautions:  Precautions Precautions: Fall Precaution Comments: impulsive, L inattention Restrictions Weight Bearing Restrictions: (Pended) No    Vital Signs: Therapy Vitals Pulse Rate: 63 BP: 125/60 Pain:  No c/o pain        Therapy/Group: Individual Therapy  Jarmon Javid 04/27/2022, 8:29 AM

## 2022-04-27 NOTE — Progress Notes (Signed)
Physical Therapy Session Note  Patient Details  Name: Gina Bailey MRN: [id number] Date of Birth: 1946-01-05  Today's Date: 04/27/2022 PT Missed Time: 45 Minutes Missed Time Reason: CT/MRI  Short Term Goals: Week 1:  PT Short Term Goal 1 (Week 1): Pt will transfer sup <> sit w/ CGA PT Short Term Goal 2 (Week 1): Pt will transfer sit to stand w/ min A PT Short Term Goal 3 (Week 1): Pt willamb 100' w/ RW and min A PT Short Term Goal 4 (Week 1): Pt will assess curb height step w/ AD   Skilled Therapeutic Interventions/Progress Updates:  Pt missed 45 min of skilled therapy due to being off unit for MRI following suspected stroke earlier in day. RN relates that pt had episode of AMS with speech difficulties and L weakness. CT completed in morning. EEG to be completed following MRI as soon as available.   Will re-attempt as schedule and pt availability permits.   Therapy Documentation Precautions:  Precautions Precautions: Fall Precaution Comments: impulsive, L inattention Restrictions Weight Bearing Restrictions: No General:   Vital Signs: Therapy Vitals Temp: 98.4 F (36.9 C) Resp: 16 BP: (!) 143/64 Oxygen Therapy SpO2: 100 % O2 Device: Room Air Pain:  Not in room during time of session.   Therapy/Group: Individual Therapy  Alger Simons PT, DPT, CSRS 04/27/2022, 1:00 PM

## 2022-04-27 NOTE — Progress Notes (Addendum)
Nurse entered patient's room [phone] was trying to communicate with patient. Nurse ask patient if she was ok. Patient shook her head no. Nurse asked patient where she was. Patient had an aphasic episode and could not respond. could not respond. Nurse asked patient to grip her hands and patient could not grip with left hand. A code stroke was called for patient, vitals, CBG, and CT scan.

## 2022-04-27 NOTE — Progress Notes (Signed)
Pt will be going to MRI soon. Will attempt EEG afterwards when schedule permits.

## 2022-04-27 NOTE — Code Documentation (Addendum)
Stroke Response Nurse Documentation Code Documentation  Gina Bailey is a 76 y.o. female admitted to [facility] on 10/4 after being admitted to [facility] on 9/28 for facial weakness and slurred speech. She has a past medical hx of CKD, DM, HLD, HTN. Patient was admitted to On aspirin 81 mg daily and clopidogrel 75 mg daily. Code stroke was activated by [facility] staff .   Patient on [facility] unit where she was LKW at 0750 when she was taking her AM medications. She worked with therapy, got up to the wheelchair and then called out to use the restroom at 0930. At this time the RN noticed slurred speech and left sided weakness; a code stroke was activated.   Stroke team at the bedside after patient activation. Patient to CT with team. NIHSS 4, see documentation for details and code stroke times. Patient with disoriented, left leg weakness, and dysarthria  on exam.   The following imaging was completed:  CT Head. Patient is not a candidate for IV Thrombolytic due to recent stroke. Patient is not a candidate for IR due to no LVO.   Delay due to patient out of bed upon arrival and patient insistent to go to bathroom prior to scan; patient educated on importance of scan first.  Care/Plan: permissive hypertension <220/110, Stat MRI--depending on results may need higher level of care.   Notified Dan A. PA.   Bedside handoff with RN [place].    Candace Cruise K  Stroke Response RN

## 2022-04-27 NOTE — Progress Notes (Signed)
Speech Language Pathology Daily Session Note  Patient Details  Name: Gina Bailey MRN: [id number] Date of Birth: 1945-11-12  Today's Date: 04/27/2022 SLP Individual Time: 1348-1430 SLP Individual Time Calculation (min): 42 min  Short Term Goals: Week 1: SLP Short Term Goal 1 (Week 1): Patient will consume current diet without overt s/sx of aspiration and self monitoring of oral residuals and/orL  buccal stasis with sup A verbal cues SLP Short Term Goal 2 (Week 1): Patient will implement speech intelligiblity strategies at the conversation level with sup A verbal cues to achieve >90% intelligibility  Skilled Therapeutic Interventions: Skilled ST treatment focused on speech goals. Code stroke called earlier secondary to report of difficulty speaking and minimal grip in her hands. Motor speech was perceived at baseline with continued mild dysarthria consistent with deficits observed during initial evaluation on 04/23/22. Per informal assessment, pt's verbal expression and comprehension were [facility] for all tasks assessed, as well as during therapeutic speech/language tasks. OME also consistent with baseline without indication for increased weakness or asymmetry. Cognition also perceived at baseline per information evaluation measures, as pt exhibited baseline deficits in memory, attention, and reasoning. Pt was alert, talkative, and playfully joking around with therapist throughout session. Pt complained of feeling cold (room was perceived to be quite chilly per SLP). Provided pt with a heated blanket per request and turned up the temperature a few degrees in room. SLP facilitated session with task to promote implementation of speech intelligibility strategies. Pt generated up to 3-4 words per chosen category (divergent naming) and verbally discussed them at the sentence level with overall sup A verbal cues to reduce speech rate and pause between words on occasion to achieve >100% speech intelligibility. Pt  also engaged in basic conversational exchange with intermittent cueing to achieve >95% intelligibility. Pt with good carry over throughout session. Patient was left in bed with alarm activated and immediate needs within reach at end of session. Continue per current plan of care.      Pain  None/denied  Therapy/Group: Individual Therapy  Patty Sermons 04/27/2022, 2:29 PM

## 2022-04-28 ENCOUNTER — Inpatient Hospital Stay (HOSPITAL_COMMUNITY): Payer: Medicare PPO

## 2022-04-28 ENCOUNTER — Inpatient Hospital Stay (HOSPITAL_BASED_OUTPATIENT_CLINIC_OR_DEPARTMENT_OTHER)
Admit: 2022-04-28 | Discharge: 2022-04-28 | Disposition: A | Discharge status: Home / Self Care | Payer: Medicare PPO | Attending: Physician Assistant | Admitting: Physician Assistant

## 2022-04-28 DIAGNOSIS — R4182 Altered mental status, unspecified: Secondary | ICD-10-CM

## 2022-04-28 DIAGNOSIS — I4891 Unspecified atrial fibrillation: Secondary | ICD-10-CM

## 2022-04-28 DIAGNOSIS — I69354 Hemiplegia and hemiparesis following cerebral infarction affecting left non-dominant side: Secondary | ICD-10-CM

## 2022-04-28 MED ORDER — AMLODIPINE BESYLATE 5 MG PO TABS
5.0000 mg | ORAL_TABLET | Freq: Every day | ORAL | Status: DC
  Administered 2022-04-28 – 2022-05-02 (×5): 5 mg via ORAL
  Filled 2022-04-28 (×5): qty 1

## 2022-04-28 MED ORDER — CARVEDILOL 6.25 MG PO TABS
6.2500 mg | ORAL_TABLET | Freq: Two times a day (BID) | ORAL | Status: DC
  Administered 2022-04-28 – 2022-05-02 (×8): 6.25 mg via ORAL
  Filled 2022-04-28 (×10): qty 1

## 2022-04-28 MED ORDER — AMLODIPINE BESYLATE 5 MG PO TABS: 5.0000 mg | ORAL_TABLET | Freq: Every day | ORAL | Status: DC

## 2022-04-28 NOTE — Progress Notes (Signed)
EEG complete - results pending 

## 2022-04-28 NOTE — Progress Notes (Signed)
Occupational Therapy Session Note  Patient Details  Name: Gina Bailey MRN: [id number] Date of Birth: Jan 24, 1946  Today's Date: 04/28/2022 OT Individual Time: 1452-1540 OT Individual Time Calculation (min): 48 min    Short Term Goals: Week 1:  OT Short Term Goal 1 (Week 1): Pt will locate items on left side with min cues OT Short Term Goal 2 (Week 1): Pt will be able to comlete sit to stands with supervision OT Short Term Goal 3 (Week 1): Pt will be able to complete stand pivot transfer to toilet with CGA  Skilled Therapeutic Interventions/Progress Updates:  Pt greeted seated in w/c, pt agreeable to OT intervention. Session focus on BADL reeducation, functional mobility, dynamic standing balance, bilateral integration, visual scanning and decreasing overall caregiver burden.     Pt completed ambulatory toilet transfer to bathroom with Rw with [place]. Pt needed [place]  for 3/3 toileting tasks needing assist for clothing mgmt, pt needed MIN a to stand using grab as pt trying to pull on RW, pt exited bathroom in same manner and able to stand at sink with CGA for hand hygiene. Pt able to locate paper towels on L side with no cues.  Total A transport to gym where pt completed below therapeutic activities with an emphasis on visual scanning, L sided attention, standing tolerance for ADL participation and dynamic standing balance.   Pt stood for 45mns and 46 secs to complete bilateral integration task of folding wash cloths at table to simulate IADL task of laundry. Pt did need one verbal cues to locate remainder of washcloths on L side of table, pt completed task with no UE support and no LOB, overall CGA.  Next, [place] completed [place] pt provided with visual aid and instructed to duplicate peg structure from visual aid provided, pt used RUE to complete precision task with an emphasis on scanning to L side to locate pegs. Pt completed task with unilateral support, no LOB and CGA.  Pt very  chatty during all tasks presenting with focused attention unable to alternate attention from conversation to functional task.   Question visual perceptual deficits as pt miss aligning peg pattern needing MAX cues to correct. Pt also trying to put pegs in visual aid picture vs actual board.   Pt completed functional ambulation back to room with Rw and CGA. Pt left seated in w/c with alarm belt activated and all needs within reach.                       Therapy Documentation Precautions:  Precautions Precautions: Fall Precaution Comments: impulsive, L inattention Restrictions Weight Bearing Restrictions: No  Pain: no pain reported during session     Therapy/Group: Individual Therapy  MCorinne PortsKSilver Oaks Behavorial Hospital10/04/2022, 4:07 PM

## 2022-04-28 NOTE — Progress Notes (Signed)
Occupational Therapy Note  Patient Details  Name: Gina Bailey MRN: [id number] Date of Birth: [birth year]  Today's Date: 04/28/2022 OT Missed Time: 38 Minutes Missed Time Reason: MD hold (comment) (code stroke/change in status)  Per chart review, pt placed on hold for therapies by PA until 11 AM due to change in medical status with code stroke called. Will attempt to revisit pt as time allows.  Ankush Gintz E Emanuella Nickle, MS, OTR/L  04/28/2022, 8:17 AM

## 2022-04-28 NOTE — Progress Notes (Signed)
Physical Therapy Session Note  Patient Details  Name: Gina Bailey MRN: [id number] Date of Birth: Feb 23, 1946  Today's Date: 04/28/2022 PT Individual Time: 0945-1028 PT Individual Time Calculation (min): 43 min   Short Term Goals: Week 1:  PT Short Term Goal 1 (Week 1): Pt will transfer sup <> sit w/ CGA PT Short Term Goal 2 (Week 1): Pt will transfer sit to stand w/ min A PT Short Term Goal 3 (Week 1): Pt willamb 100' w/ RW and min A PT Short Term Goal 4 (Week 1): Pt will assess curb height step w/ AD  Skilled Therapeutic Interventions/Progress Updates:      Cleared from RN to start as pt was on active bedrest but no longer.   MRI yesterday due to change in status with the following: 1. 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 2. Slightly increased size of recent infarct in the posterior limb of the right internal capsule. 3. Extensive chronic small vessel ischemic disease.    Pt in room sitting in w/c to start - family (son and DIL) at bedside. Pt agreeable to PT tx but requetss to use bathroom prior to leaving her room. Sit<>stand to RW with supervision and ambulates with CGA and RW into bathroom. Pt requesting DIL to assist (female preference) with toileting - handoff of care to DIL (who is a Therapist, sports) who assisted patient with toileting - continent of bladder - charted.  Transported in w/c to ortho rehab gym. Practiced ambulatory car transfers with CGA and RW - pt able to manage LE in/out of car without assist with ++ time for managing her paretic L leg.   Gait training during session ~110f + ~1564fwith close supervision and RW - cues for upright posture, keeping body within walker frame (tends to drift away as she fatigues) and awareness of posture due to slight L lean.   Practiced furniture transfers from low sitting sofa with RW - required modA for powering to rise - pt reports she avoids sitting in low surfaces at home because of this reason. Also practiced  bed mobility on regular flat bed in ADL apartment - pt required minA for LLE management for sit>supine. She completes supine<>sit with CGA with pt slightly impulsive and decreased safety awareness with quick upright sitting, risk of sliding off of bed. Education on safety and fall prevention strategies.  Pt ended session in w/c with safety belt alarm on, son at bedside, all needs met.   Therapy Documentation Precautions:  Precautions Precautions: Fall Precaution Comments: impulsive, L inattention Restrictions Weight Bearing Restrictions: No General:     Therapy/Group: Individual Therapy  ChAlger Simons0/04/2022, 7:46 AM

## 2022-04-28 NOTE — Progress Notes (Addendum)
PROGRESS NOTE   Subjective/Complaints:  Appreciate neuro note   Review of Systems  Constitutional:  Negative for chills and fever.  HENT:  Negative for congestion.   Eyes:  Negative for double vision.  Respiratory:  Negative for cough.   Cardiovascular:  Negative for chest pain and palpitations.  Gastrointestinal:  Negative for abdominal pain, diarrhea, heartburn, nausea and vomiting.  Genitourinary: Negative.   Musculoskeletal: Negative.   Neurological:  Positive for weakness. Negative for dizziness.     Objective:   MR BRAIN WO CONTRAST  Result Date: 04/27/2022 CLINICAL DATA:  Neuro deficit, acute, stroke suspected. Recent right lacunar stroke, worsening left leg weakness today. EXAM: MRI HEAD WITHOUT CONTRAST MRA HEAD WITHOUT CONTRAST TECHNIQUE: Multiplanar, multi-echo pulse sequences of the brain and surrounding structures were acquired without intravenous contrast. Angiographic images of the Circle of Willis were acquired using MRA technique without intravenous contrast. COMPARISON:  Head CT 04/27/2022.  Head MRI and MRA 04/16/2022. FINDINGS: MRI HEAD FINDINGS Brain: A recent infarct in the posterior limb of the right internal capsule demonstrates persistent restricted diffusion and appears slightly larger than on the prior MRI. There is a new punctate acute infarct in the subcortical white matter of the anteromedial right frontal lobe. A small infarct involving the periventricular white matter of the left occipital lobe on the prior MRI demonstrates decreased diffusion weighted signal abnormality. Punctate foci of subtle trace diffusion weighted signal hyperintensity in the medial right parietal subcortical white matter and left corona radiata were also present on the prior MRI and are without reduced ADC. Confluent T2 hyperintensities elsewhere in the cerebral white matter bilaterally and patchy T2 hyperintensities in the pons  are similar to the prior MRI and are nonspecific but compatible with extensive chronic small vessel ischemic disease. Small chronic infarcts are again noted in the basal ganglia, right thalamus, and right cerebellar hemisphere. No intracranial hemorrhage, mass, midline shift, or extra-axial fluid collection is identified. There is mild generalized cerebral atrophy. Vascular: Major intracranial vascular flow voids are preserved. Skull and upper cervical spine: Unremarkable bone marrow signal. Sinuses/Orbits: Bilateral cataract extraction. Paranasal sinuses and mastoid air cells are clear. Other: None. MRA HEAD FINDINGS Anterior circulation: The internal carotid arteries are widely patent from skull base to carotid termini. ACAs and MCAs are patent without evidence of a proximal branch occlusion or significant A1 or M1 stenosis. Detail branch vessel evaluation is limited by motion. No aneurysm is identified. Posterior circulation: The included intracranial vertebral arteries are patent to the basilar. The basilar artery is widely patent. Posterior communicating arteries are diminutive or absent. Both PCAs are patent without evidence of a significant proximal stenosis. No aneurysm is identified. Anatomic variants: None. IMPRESSION: 1. 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 2. Slightly increased size of recent infarct in the posterior limb of the right internal capsule. 3. Extensive chronic small vessel ischemic disease. 4. Motion degraded head MRA without evidence of a large vessel occlusion or significant proximal stenosis. Electronically Signed   By: Logan Bores M.D.   On: 04/27/2022 15:35   MR ANGIO HEAD WO CONTRAST  Result Date: 04/27/2022 CLINICAL DATA:  Neuro deficit, acute, stroke suspected. Recent  right lacunar stroke, worsening left leg weakness today. EXAM: MRI HEAD WITHOUT CONTRAST MRA HEAD WITHOUT CONTRAST TECHNIQUE: Multiplanar, multi-echo pulse sequences of the brain and  surrounding structures were acquired without intravenous contrast. Angiographic images of the Circle of Willis were acquired using MRA technique without intravenous contrast. COMPARISON:  Head CT 04/27/2022.  Head MRI and MRA 04/16/2022. FINDINGS: MRI HEAD FINDINGS Brain: A recent infarct in the posterior limb of the right internal capsule demonstrates persistent restricted diffusion and appears slightly larger than on the prior MRI. There is a new punctate acute infarct in the subcortical white matter of the anteromedial right frontal lobe. A small infarct involving the periventricular white matter of the left occipital lobe on the prior MRI demonstrates decreased diffusion weighted signal abnormality. Punctate foci of subtle trace diffusion weighted signal hyperintensity in the medial right parietal subcortical white matter and left corona radiata were also present on the prior MRI and are without reduced ADC. Confluent T2 hyperintensities elsewhere in the cerebral white matter bilaterally and patchy T2 hyperintensities in the pons are similar to the prior MRI and are nonspecific but compatible with extensive chronic small vessel ischemic disease. Small chronic infarcts are again noted in the basal ganglia, right thalamus, and right cerebellar hemisphere. No intracranial hemorrhage, mass, midline shift, or extra-axial fluid collection is identified. There is mild generalized cerebral atrophy. Vascular: Major intracranial vascular flow voids are preserved. Skull and upper cervical spine: Unremarkable bone marrow signal. Sinuses/Orbits: Bilateral cataract extraction. Paranasal sinuses and mastoid air cells are clear. Other: None. MRA HEAD FINDINGS Anterior circulation: The internal carotid arteries are widely patent from skull base to carotid termini. ACAs and MCAs are patent without evidence of a proximal branch occlusion or significant A1 or M1 stenosis. Detail branch vessel evaluation is limited by motion. No  aneurysm is identified. Posterior circulation: The included intracranial vertebral arteries are patent to the basilar. The basilar artery is widely patent. Posterior communicating arteries are diminutive or absent. Both PCAs are patent without evidence of a significant proximal stenosis. No aneurysm is identified. Anatomic variants: None. IMPRESSION: 1. 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 2. Slightly increased size of recent infarct in the posterior limb of the right internal capsule. 3. Extensive chronic small vessel ischemic disease. 4. Motion degraded head MRA without evidence of a large vessel occlusion or significant proximal stenosis. Electronically Signed   By: Logan Bores M.D.   On: 04/27/2022 15:35   CT HEAD CODE STROKE WO CONTRAST  Result Date: 04/27/2022 CLINICAL DATA:  Code stroke.  Neuro deficit, acute, stroke suspected EXAM: CT HEAD WITHOUT CONTRAST TECHNIQUE: Contiguous axial images were obtained from the base of the skull through the vertex without intravenous contrast. RADIATION DOSE REDUCTION: This exam was performed according to the departmental dose-optimization program which includes automated exposure control, adjustment of the mA and/or kV according to patient size and/or use of iterative reconstruction technique. COMPARISON:  MRI head 04/08/2022. FINDINGS: Brain: Remote infarct in the right thalamus and patchy white matter hypodensities, nonspecific but compatible with chronic microvascular ischemic disease. Additional mode infarct in the right cerebellum. No evidence of acute large vascular territory infarct, acute hemorrhage, mass lesion, midline shift or hydrocephalus. Vascular: Calcific intracranial atherosclerosis. No hyperdense vessel identified. Skull: No acute fracture. Sinuses/Orbits: Left ethmoid air cell osteoma. No acute orbital findings. Other: No mastoid effusions. ASPECTS [facility] Stroke Program Early CT Score) total score (0-10 with 10 being  normal): 10. IMPRESSION: 1. No evidence of acute intracranial abnormality.  ASPECTS is 10. 2. Chronic microvascular ischemic disease. Code stroke imaging results were communicated on 04/27/2022 at 10:27 am to provider Bhagat via secure text paging. Electronically Signed   By: Margaretha Sheffield M.D.   On: 04/27/2022 10:27   Recent Labs    04/27/22 0612  WBC 9.9  HGB 8.6*  HCT 27.1*  PLT 344    Recent Labs    04/27/22 0612  NA 142  K 4.4  CL 105  CO2 26  GLUCOSE 109*  BUN 107*  CREATININE 5.55*  CALCIUM 8.8*     Intake/Output Summary (Last 24 hours) at 04/28/2022 0813 Last data filed at 04/28/2022 0802 Gross per 24 hour  Intake 360 ml  Output --  Net 360 ml         Physical Exam: Vital Signs Blood pressure (!) 128/59, pulse 63, temperature 98.7 F (37.1 C), temperature source Oral, resp. rate 18, height '5\' 6"'$  (1.676 m), weight 77 kg, last menstrual period 10/22/1978, SpO2 98 %.   General: No acute distress Mood and affect are appropriate Heart: Regular rate and rhythm no rubs murmurs or extra sounds Lungs: Clear to auscultation, breathing unlabored, no rales or wheezes Abdomen: Positive bowel sounds, soft nontender to palpation, nondistended Extremities: No clubbing, cyanosis, or edema Skin: No evidence of breakdown, no evidence of rash   Neuro:  Alert to person and place decreased memory, follows simple commands, CN 2-12 intact orther than L facial weakness,mild  L inattention, she has dysarthria- no aphasia  Sensation intact to LT in all 4 extremities Strength 5/5 in b/l UE and RLE, strength  4-/5 in proximal LLE and 3/5 in distal LLE  FTN intact b/l   Assessment/Plan: 1. Functional deficits which require 3+ hours per day of interdisciplinary therapy in a comprehensive inpatient rehab setting. Physiatrist is providing close team supervision and 24 hour management of active medical problems listed below. Physiatrist and rehab team continue to assess barriers  to discharge/monitor patient progress toward functional and medical goals  Care Tool:  Bathing    Body parts bathed by patient: Right arm, Left arm, Abdomen, Chest, Front perineal area, Buttocks, Left upper leg, Right upper leg, Right lower leg, Face   Body parts bathed by helper: Left lower leg     Bathing assist Assist Level: Minimal Assistance - Patient > 75%     Upper Body Dressing/Undressing Upper body dressing   What is the patient wearing?: Button up shirt    Upper body assist Assist Level: Contact Guard/Touching assist    Lower Body Dressing/Undressing Lower body dressing      What is the patient wearing?: Underwear/pull up, Pants     Lower body assist Assist for lower body dressing: Moderate Assistance - Patient 50 - 74%     Toileting Toileting    Toileting assist Assist for toileting: Minimal Assistance - Patient > 75%     Transfers Chair/bed transfer  Transfers assist     Chair/bed transfer assist level: Minimal Assistance - Patient > 75%     Locomotion Ambulation   Ambulation assist      Assist level: Moderate Assistance - Patient 50 - 74% Assistive device: Walker-rolling Max distance: 90   Walk 10 feet activity   Assist     Assist level: Moderate Assistance - Patient - 50 - 74% Assistive device: Walker-rolling   Walk 50 feet activity   Assist    Assist level: Moderate Assistance - Patient - 50 - 74% Assistive device: Walker-rolling  Walk 150 feet activity   Assist Walk 150 feet activity did not occur: Safety/medical concerns         Walk 10 feet on uneven surface  activity   Assist Walk 10 feet on uneven surfaces activity did not occur: Safety/medical concerns         Wheelchair     Assist Is the patient using a wheelchair?: Yes Type of Wheelchair: Manual    Wheelchair assist level: Minimal Assistance - Patient > 75% Max wheelchair distance: 90    Wheelchair 50 feet with 2 turns  activity    Assist        Assist Level: Minimal Assistance - Patient > 75%   Wheelchair 150 feet activity     Assist      Assist Level: Moderate Assistance - Patient 50 - 74%   Blood pressure (!) 128/59, pulse 63, temperature 98.7 F (37.1 C), temperature source Oral, resp. rate 18, height '5\' 6"'$  (1.676 m), weight 77 kg, last menstrual period 10/22/1978, SpO2 98 %.  Medical Problem List and Plan: 1. Functional deficits secondary to right posterior limb internal capsule as well as left tiny periventricular white matter lacunar infarcts with dysarthria  Temporary worsening of deficits now resolved , likely hypoperfusion , reduced BP will adjust meds,  Repeat CT no acute changes, repeat MRI small RIght frontal infarct, extension of R PLIC infarct but neuro exam unchanged , may resume therapy today and will see if function was affected   Given new infarcts while on DAPT will ask cardiology to place event monitor              -patient may shower             -ELOS/Goals: 7-10 days            2.  Antithrombotics: -DVT/anticoagulation:  Pharmaceutical: Heparin- will d/c amb >1500' in PT             -antiplatelet therapy: aspirin 81 mg and Plavix for 3 weeks (until 05/07/22) then aspirin alone 3. Pain Management: Tylenol as needed 4. Mood/Behavior/Sleep: LCSW to evaluate and provide emotional support             -antipsychotic agents: none 5. Neuropsych/cognition: This patient does capable of making decisions on her own behalf. 6. Skin/Wound Care: Routine skin care checks 7. 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 Glucose stable on last BMP 10: Chronic BLE edema: minimal currently -continue Lasix 80 mg q AM (40 mg q PM discontinued)             -continue compression stockings when OOB 11: Hypertension: monitor             -continue amlodipine 10 mg daily- reduce to '5mg'$  daily starting today             -continue carvedilol 12.5 mg BID- reduce to 6.'25mg'$  BID starting today              -  continue Lasix   Vitals:   04/27/22 1830 04/28/22 0445  BP: (!) 144/64 (!) 128/59  Pulse: 71 63  Resp: 19 18  Temp: 98.2 F (36.8 C) 98.7 F (37.1 C)  SpO2: 100% 68%   Goal of systolic 088-110 based on symptomatic hypotension when Sys BP was ~169mHg 12: Diabetic neuropathy both feet: follows with podiatry 13: Hyperlipidemia: Crestor 20 mg daily 14: Anemia of chronic renal failure: received 4 transfusion of IV Ferrlecit; follow-up CBC  -10/9 HGB stable at 8.9    Latest Ref Rng & Units 04/27/2022    6:12 AM 04/23/2022    5:12 AM 04/22/2022    3:32 PM  CBC  WBC 4.0 - 10.5 K/uL 9.9  8.6  7.7   Hemoglobin 12.0 - 15.0 g/dL 8.6  8.1  8.0   Hematocrit 36.0 - 46.0 % 27.1  25.8  25.9   Platelets 150 - 400 K/uL 344  299  313     15: Gout flare: prednisone for 3 days 16: Obesity: dietary counseling 17: Bilateral carotid artery stenosis: outpatient surveillance 18. Dysphagia             -SLP eval, dys 3 diet with thin liquids  Eating well  19. Hypermagnesemia  -Mild asymptomatic,  avoid Mg supplements, recheck monday    LOS: 6 days A FACE TO FRed WingE Nayel Purdy 04/28/2022, 8:13 AM

## 2022-04-28 NOTE — Progress Notes (Signed)
Occupational Therapy Session Note  Patient Details  Name: Gina Bailey MRN: [id number] Date of Birth: 1946/04/10  Today's Date: 04/28/2022 OT Individual Time: 0830-0900 OT Individual Time Calculation (min): 30 min  and Today's Date: 04/28/2022 OT Missed Time: 15 Minutes Missed Time Reason: MD hold (comment) (Pt on hold this AM due to stroke alert called yesterday. Pt seen following removal of hold from MD.)   Short Term Goals: Week 1:  OT Short Term Goal 1 (Week 1): Pt will locate items on left side with min cues OT Short Term Goal 2 (Week 1): Pt will be able to comlete sit to stands with supervision OT Short Term Goal 3 (Week 1): Pt will be able to complete stand pivot transfer to toilet with CGA  Skilled Therapeutic Interventions/Progress Updates:     Pt cleared for therapy per MD. Pt received in room sitting in WC. Pt reported no pain at this time. Pt was oriented to self and date and able to recall most rect CVA following verbal prompt. Pt was motivated to participate in therapy. Pt donned shirt and bra sitting in wc in front of sink mirror with min A. Pt unaware of left lateral lean, but was able to self correct using visual feedback from mirror following verbal prompting. Pt demonstrated challenges maintaining upright posture in chair due decreased strength and limited insight. Pt reported need to use the bathroom and ambulated to bathroom with RW min A to navigate room with RW and maintain upright posture. Pt transferred to toilet with min A to guide hips back and min verbal cueing to reach for hand rail for safety. Pt completed peri care by leaning to the left and required CGA for safety to maintain seated balance. Pt then donned brief and pants while seated on toilet with mod A to weave feet into pants and to pull pants over hips in standing. Pt was able to ambulate back to her wc with her RW with CGA-min A for safety when navigating her room. Pt is functionally incorporating her LUE  during ADLs  50-75% of the time and demonstrating decreased awareness to the environment on her left side. Pt was left sitting in wc with seat belt alarm on, call bell in reach, and all needs met.   Therapy Documentation Precautions:  Precautions Precautions: Fall Precaution Comments: impulsive, L inattention Restrictions Weight Bearing Restrictions: No General: General OT Amount of Missed Time: 15 Minutes    ADL: ADL Upper Body Bathing: Minimal assistance, Minimal cueing Where Assessed-Upper Body Bathing: Shower (Sitting in shower chair) Lower Body Bathing: Minimal assistance, Minimal cueing Where Assessed-Lower Body Bathing: Shower Upper Body Dressing: Minimal assistance, Minimal cueing Where Assessed-Upper Body Dressing: Sitting at sink, Wheelchair Lower Body Dressing: Moderate cueing, Moderate assistance Where Assessed-Lower Body Dressing: Other (Comment) (Sitting on toilet/standing wiht RW) Toileting: Minimal assistance (Min A required to maintain dynamic sitting balace on toilet) Where Assessed-Toileting: Glass blower/designer: Psychiatric nurse Method: Ambulating Dietitian) Science writer: Energy manager: Minimal assistance, Moderate cueing Social research officer, government Method: Heritage manager: Shower seat without back    Therapy/Group: Individual Therapy  Janey Genta, OTR/L 04/28/2022, 9:22 AM

## 2022-04-28 NOTE — Progress Notes (Signed)
Physical Therapy Session Note  Patient Details  Name: Gina Bailey MRN: [id number] Date of Birth: 12/17/1945  Today's Date: 04/28/2022 PT Individual Time: 1110-1205 PT Individual Time Calculation (min): 55 min   Short Term Goals: Week 1:  PT Short Term Goal 1 (Week 1): Pt will transfer sup <> sit w/ CGA PT Short Term Goal 2 (Week 1): Pt will transfer sit to stand w/ min A PT Short Term Goal 3 (Week 1): Pt willamb 100' w/ RW and min A PT Short Term Goal 4 (Week 1): Pt will assess curb height step w/ AD   Skilled Therapeutic Interventions/Progress Updates:  Patient seated upright in w/c on entrance to room. Son present. Patient alert and agreeable to PT session. Bed rest restrictions off following new infarct found during MRI.   Patient with no pain complaint at start of session.  Therapeutic Activity: Transfers: Pt performed sit<>stand and stand pivot transfers throughout session with supervision. Provided verbal cues for safe tchnique. Tiolet transfer performed with supervision and MinA for donning LB clothing d/t reduced grasp with LUE. Stands at sink to wash hands and face with supervision.   Gait Training:  Pt ambulated >200' x2 ft using RW with close supervision. Demonstrated crowding of walker to L side and continued vc for correction. Provided vc/ tc for self monitoring of fatigue and upright posture. VC for finding pbjects on wall to pt's L throughout ambulation in order to improve L visual scanning.   Pt guided in stair training with physical demonstration and verbal instructions provided prior to performance. Pt is able to complete four 6" steps using BHR with CGA to ascend leading with RLE and CGA to descend leading with LLE. VC provided at initiation of each direction for leading LE.    Neuromuscular Re-ed: NMR facilitated during session with focus on standing balance and dynamic gait. Pt guided in TUG performance with RW and averages 37.6 sec (slower than previous score on  10/6 without AD. NMR performed for improvements in motor control and coordination, balance, sequencing, judgement, and self confidence/ efficacy in performing all aspects of mobility at highest level of independence.   Patient seated upright in w/c at end of session with brakes locked, no belt alarm set as son in room, and all needs within reach. Reiterated to son to make sure to call staff to engage belt alarm if he leaves the room. RN present to listen to pt and attain new set of vitals.    Therapy Documentation Precautions:  Precautions Precautions: Fall Precaution Comments: impulsive, L inattention Restrictions Weight Bearing Restrictions: No General:   Vital Signs: Therapy Vitals Temp: 97.8 F (36.6 C) Pulse Rate: 71 Resp: 18 BP: (!) 128/58 Patient Position (if appropriate): Sitting Oxygen Therapy SpO2: 100 % O2 Device: Room Air Pain:  No pain related this session.    Therapy/Group: Individual Therapy  Alger Simons PT, DPT, CSRS 04/28/2022, 5:05 PM

## 2022-04-28 NOTE — Progress Notes (Addendum)
Neurology Progress Note  Brief HPI: Patient with history of CKD, DM, HTN, HLD and recent strokes in the right lentiform nucleus and left posterior occipital horn experienced acute onset aphasia, dysarthria and left sided weakness yesterday.  Deficits improved rapidly.  MRI shows new punctate stroke in right frontal white matter as well as increased size of infarct in the right internal capsule, concerning for embolic source.  Subjective: Patient reports feeling better today and has no new complaints.  Exam: Vitals:   04/27/22 1830 04/28/22 0445  BP: (!) 144/64 (!) 128/59  Pulse: 71 63  Resp: 19 18  Temp: 98.2 F (36.8 C) 98.7 F (37.1 C)  SpO2: 100% 98%   Gen: In bed, NAD Resp: non-labored breathing, no acute distress  Neuro: Mental Status: alert and oriented x3, able to recall yesterday's events.  Voice slightly dysarthric Cranial Nerves:PERRL, EOMI, visual fields intact, facial sensation intact and symmetrical, slight left facial droop, hearing intact to voice, phonation normal, shoulder shrug symmetrical, tongue midline Motor: moves all extremities to command with 5/5 strength in BUE and RLE, 4+/5 strength in LLE Sensory:intact to light touch throughout Coordination:  FTN intact bilaterally Gait: Deferred  Pertinent Labs: CBC    Component Value Date/Time   WBC 9.9 04/27/2022 0612   RBC 3.19 (L) 04/27/2022 0612   HGB 8.6 (L) 04/27/2022 0612   HGB 13.0 11/27/2015 1400   HCT 27.1 (L) 04/27/2022 0612   HCT 40.4 11/27/2015 1400   PLT 344 04/27/2022 0612   PLT 254 11/27/2015 1400   MCV 85.0 04/27/2022 0612   MCV 85 11/27/2015 1400   MCH 27.0 04/27/2022 0612   MCHC 31.7 04/27/2022 0612   RDW 17.2 (H) 04/27/2022 0612   RDW 14.9 11/27/2015 1400   LYMPHSABS 0.8 04/23/2022 0512   MONOABS 0.3 04/23/2022 0512   EOSABS 0.0 04/23/2022 0512   BASOSABS 0.0 04/23/2022 0512       Latest Ref Rng & Units 04/27/2022    6:12 AM 04/24/2022    4:52 AM 04/23/2022    5:12 AM  BMP   Glucose 70 - 99 mg/dL 109  196  161   BUN 8 - 23 mg/dL 107  83  81   Creatinine 0.44 - 1.00 mg/dL 5.55  5.44  5.77   Sodium 135 - 145 mmol/L 142  139  138   Potassium 3.5 - 5.1 mmol/L 4.4  4.8  5.0   Chloride 98 - 111 mmol/L 105  107  103   CO2 22 - 32 mmol/L 26  22  23   $ Calcium 8.9 - 10.3 mg/dL 8.8  8.8  8.9    Lipid Panel     Component Value Date/Time   CHOL 223 (H) 04/09/2022 0945   TRIG 58 04/09/2022 0945   HDL 78 04/09/2022 0945   CHOLHDL 2.9 04/09/2022 0945   CHOLHDL 2.3 05/22/2014 1635   VLDL 12 05/22/2014 1635   LDLCALC 135 (H) 04/09/2022 0945   LABVLDL 10 04/09/2022 0945    Lab Results  Component Value Date   HGBA1C 5.4 04/09/2022     Imaging Reviewed:  CT head:  No acute abnormality, chronic microvascular ischemic disease  MRI brain 10/9: new punctate infarct in right frontal white matter, slightly increased size of infarct in posterior limb of right internal capsule  MRA head:  motion degraded, no LVO or significant stenosis  EEG: - Intermittent rhythmic slow, right temporal region This study is suggestive of cortical dysfunction arising from right temporal region  likely secondary to underlying structural abnormality. No seizures or epileptiform discharges were seen throughout the recording.  Assessment: 76 year old patient with history of CKD, HTN, HLD and DM experienced acute onset aphasia, dysarthria and left sided weakness which improved quickly.  Patient is now back to baseline.  MRI reveals new stroke in right frontal white matter and increased size of stroke in right internal capsule.  New stroke appears potentially embolic, so will investigate for atrial fibrillation with 2 week monitor and potential outpatient loop recorder.  Impression: New punctate stroke, potentially embolic, in patient with two recent strokes.  Recommendations: - Goal normotension from now as she has been tolerating normotension - continue statin per guidelines - continue  aspirin and clopidogrel antiplt/anticoag for 3 additional weeks (end date 10/29), then ASA 81 mg monotherapy unless indication for [facility] is found at which point antiplatelets should be stopped from neurological perspective (ASA 81 can be continued if patient has CAD indication) - q8 hr neuro checks - STAT head CT for any change in neuro exam, and activate code stroke - Continue PT/OT/SLP - Amb referral to neurology upon discharge already placed on her recent discharge - Appreciate cardiology arrangement for zio patch - Neurology will be available as needed going forward, please reach out if new questions or concerns arise  [place] , MSN, AGACNP-BC [facility] See Amion for schedule and pager information 04/28/2022 9:23 AM  Attending Neurologist's note:  I personally saw this patient, gathering history, performing a neurologic examination, reviewing relevant labs, personally reviewing relevant imaging including MRI brain, MRA head, and formulated the assessment and plan, adding the note above for completeness and clarity to accurately reflect my thoughts.  Recommendations conveyed to primary team via secure chat  Lesleigh Noe MD-PhD [facility] [phone]  Available 7 AM to 7 PM, outside these hours please contact Neurologist on call listed on AMION

## 2022-04-28 NOTE — Progress Notes (Signed)
Called by neurology NP to help facilitate 2 week Zio AT monitor for surveillance of Afib in a pt w/cryptogenic stroke . The monitor has been ordered and I have notified EKG department for application. Tommye Standard, PA-C

## 2022-04-28 NOTE — Procedures (Signed)
Patient Name: PAVNEET MARKWOOD  MRN: [id number]  Epilepsy Attending: Lora Havens  Referring Physician/Provider: Katy Apo, NP  Date: 04/28/2022 Duration: 22.36 mins  Patient history: 76 year old patient with HTN, HLD, CKD, HLD, HTN and recent strokes in the right lentiform nucleus and left posterior occipital horn presents today with sudden onset left sided weakness, aphasia and dysarthria. EEG to evaluate for seizure  Level of alertness: Awake  AEDs during EEG study: None  Technical aspects: This EEG study was done with scalp electrodes positioned according to the 10-20 International system of electrode placement. Electrical activity was reviewed with band pass filter of 1-'70Hz'$ , sensitivity of 7 uV/mm, display speed of 6m/sec with a '60Hz'$  notched filter applied as appropriate. EEG data were recorded continuously and digitally stored.  Video monitoring was available and reviewed as appropriate.  Description: The posterior dominant rhythm consists of 8-9 Hz activity of moderate voltage (25-35 uV) seen predominantly in posterior head regions, symmetric and reactive to eye opening and eye closing. EEG showed intermittent rhythmic 2-'3Hz'$  delta slowing in right temporal region. Hyperventilation and photic stimulation were not performed.     ABNORMALITY - Intermittent rhythmic slow, right temporal region  IMPRESSION: This study is suggestive of cortical dysfunction arising from right temporal region  likely secondary to underlying structural abnormality. No seizures or epileptiform discharges were seen throughout the recording.  Hanan Mcwilliams OBarbra Sarks

## 2022-04-29 NOTE — Progress Notes (Signed)
Patient ID: Gina Bailey, female   DOB: January 24, 1946, 76 y.o.   MRN: [id number]  RW ordered through adapt

## 2022-04-29 NOTE — Progress Notes (Signed)
PROGRESS NOTE   Subjective/Complaints:  Son and daughter at bedside, discussed MRI findings  Review of Systems  Constitutional:  Negative for chills and fever.  HENT:  Negative for congestion.   Eyes:  Negative for double vision.  Respiratory:  Negative for cough.   Cardiovascular:  Negative for chest pain and palpitations.  Gastrointestinal:  Negative for abdominal pain, diarrhea, heartburn, nausea and vomiting.  Genitourinary: Negative.   Musculoskeletal: Negative.   Neurological:  Positive for weakness. Negative for dizziness.     Objective:   EEG adult  Result Date: 04/28/2022 Lora Havens, MD     04/28/2022  8:57 AM Patient Name: Gina Bailey MRN: [id number] Epilepsy Attending: Lora Havens Referring Physician/Provider: Katy Apo, NP Date: 04/28/2022 Duration: 22.36 mins Patient history: 76 year old patient with HTN, HLD, CKD, HLD, HTN and recent strokes in the right lentiform nucleus and left posterior occipital horn presents today with sudden onset left sided weakness, aphasia and dysarthria. EEG to evaluate for seizure Level of alertness: Awake AEDs during EEG study: None Technical aspects: This EEG study was done with scalp electrodes positioned according to the 10-20 International system of electrode placement. Electrical activity was reviewed with band pass filter of 1-_0 , sensitivity of 7 uV/mm, display speed of 54m/sec with a _1  notched filter applied as appropriate. EEG data were recorded continuously and digitally stored.  Video monitoring was available and reviewed as appropriate. Description: The posterior dominant rhythm consists of 8-9 Hz activity of moderate voltage (25-35 uV) seen predominantly in posterior head regions, symmetric and reactive to eye opening and eye closing. EEG showed intermittent rhythmic 2-_2  delta slowing in right temporal region. Hyperventilation and photic  stimulation were not performed.   ABNORMALITY - Intermittent rhythmic slow, right temporal region IMPRESSION: This study is suggestive of cortical dysfunction arising from right temporal region  likely secondary to underlying structural abnormality. No seizures or epileptiform discharges were seen throughout the recording. PLora Havens  MR BRAIN WO CONTRAST  Result Date: 04/27/2022 CLINICAL DATA:  Neuro deficit, acute, stroke suspected. Recent right lacunar stroke, worsening left leg weakness today. EXAM: MRI HEAD WITHOUT CONTRAST MRA HEAD WITHOUT CONTRAST TECHNIQUE: Multiplanar, multi-echo pulse sequences of the brain and surrounding structures were acquired without intravenous contrast. Angiographic images of the Circle of Willis were acquired using MRA technique without intravenous contrast. COMPARISON:  Head CT 04/27/2022.  Head MRI and MRA 04/16/2022. FINDINGS: MRI HEAD FINDINGS Brain: A recent infarct in the posterior limb of the right internal capsule demonstrates persistent restricted diffusion and appears slightly larger than on the prior MRI. There is a new punctate acute infarct in the subcortical white matter of the anteromedial right frontal lobe. A small infarct involving the periventricular white matter of the left occipital lobe on the prior MRI demonstrates decreased diffusion weighted signal abnormality. Punctate foci of subtle trace diffusion weighted signal hyperintensity in the medial right parietal subcortical white matter and left corona radiata were also present on the prior MRI and are without reduced ADC. Confluent T2 hyperintensities elsewhere in the cerebral white matter bilaterally and patchy T2 hyperintensities in the pons are similar to the prior MRI  and are nonspecific but compatible with extensive chronic small vessel ischemic disease. Small chronic infarcts are again noted in the basal ganglia, right thalamus, and right cerebellar hemisphere. No intracranial hemorrhage,  mass, midline shift, or extra-axial fluid collection is identified. There is mild generalized cerebral atrophy. Vascular: Major intracranial vascular flow voids are preserved. Skull and upper cervical spine: Unremarkable bone marrow signal. Sinuses/Orbits: Bilateral cataract extraction. Paranasal sinuses and mastoid air cells are clear. Other: None. MRA HEAD FINDINGS Anterior circulation: The internal carotid arteries are widely patent from skull base to carotid termini. ACAs and MCAs are patent without evidence of a proximal branch occlusion or significant A1 or M1 stenosis. Detail branch vessel evaluation is limited by motion. No aneurysm is identified. Posterior circulation: The included intracranial vertebral arteries are patent to the basilar. The basilar artery is widely patent. Posterior communicating arteries are diminutive or absent. Both PCAs are patent without evidence of a significant proximal stenosis. No aneurysm is identified. Anatomic variants: None. IMPRESSION: 1. 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 2. Slightly increased size of recent infarct in the posterior limb of the right internal capsule. 3. Extensive chronic small vessel ischemic disease. 4. Motion degraded head MRA without evidence of a large vessel occlusion or significant proximal stenosis. Electronically Signed   By: Logan Bores M.D.   On: 04/27/2022 15:35   MR ANGIO HEAD WO CONTRAST  Result Date: 04/27/2022 CLINICAL DATA:  Neuro deficit, acute, stroke suspected. Recent right lacunar stroke, worsening left leg weakness today. EXAM: MRI HEAD WITHOUT CONTRAST MRA HEAD WITHOUT CONTRAST TECHNIQUE: Multiplanar, multi-echo pulse sequences of the brain and surrounding structures were acquired without intravenous contrast. Angiographic images of the Circle of Willis were acquired using MRA technique without intravenous contrast. COMPARISON:  Head CT 04/27/2022.  Head MRI and MRA 04/16/2022. FINDINGS: MRI HEAD  FINDINGS Brain: A recent infarct in the posterior limb of the right internal capsule demonstrates persistent restricted diffusion and appears slightly larger than on the prior MRI. There is a new punctate acute infarct in the subcortical white matter of the anteromedial right frontal lobe. A small infarct involving the periventricular white matter of the left occipital lobe on the prior MRI demonstrates decreased diffusion weighted signal abnormality. Punctate foci of subtle trace diffusion weighted signal hyperintensity in the medial right parietal subcortical white matter and left corona radiata were also present on the prior MRI and are without reduced ADC. Confluent T2 hyperintensities elsewhere in the cerebral white matter bilaterally and patchy T2 hyperintensities in the pons are similar to the prior MRI and are nonspecific but compatible with extensive chronic small vessel ischemic disease. Small chronic infarcts are again noted in the basal ganglia, right thalamus, and right cerebellar hemisphere. No intracranial hemorrhage, mass, midline shift, or extra-axial fluid collection is identified. There is mild generalized cerebral atrophy. Vascular: Major intracranial vascular flow voids are preserved. Skull and upper cervical spine: Unremarkable bone marrow signal. Sinuses/Orbits: Bilateral cataract extraction. Paranasal sinuses and mastoid air cells are clear. Other: None. MRA HEAD FINDINGS Anterior circulation: The internal carotid arteries are widely patent from skull base to carotid termini. ACAs and MCAs are patent without evidence of a proximal branch occlusion or significant A1 or M1 stenosis. Detail branch vessel evaluation is limited by motion. No aneurysm is identified. Posterior circulation: The included intracranial vertebral arteries are patent to the basilar. The basilar artery is widely patent. Posterior communicating arteries are diminutive or absent. Both PCAs are patent without evidence of a  significant proximal stenosis. No aneurysm is identified. Anatomic variants: None. IMPRESSION: 1. 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 2. Slightly increased size of recent infarct in the posterior limb of the right internal capsule. 3. Extensive chronic small vessel ischemic disease. 4. Motion degraded head MRA without evidence of a large vessel occlusion or significant proximal stenosis. Electronically Signed   By: Logan Bores M.D.   On: 04/27/2022 15:35   CT HEAD CODE STROKE WO CONTRAST  Result Date: 04/27/2022 CLINICAL DATA:  Code stroke.  Neuro deficit, acute, stroke suspected EXAM: CT HEAD WITHOUT CONTRAST TECHNIQUE: Contiguous axial images were obtained from the base of the skull through the vertex without intravenous contrast. RADIATION DOSE REDUCTION: This exam was performed according to the departmental dose-optimization program which includes automated exposure control, adjustment of the mA and/or kV according to patient size and/or use of iterative reconstruction technique. COMPARISON:  MRI head 04/08/2022. FINDINGS: Brain: Remote infarct in the right thalamus and patchy white matter hypodensities, nonspecific but compatible with chronic microvascular ischemic disease. Additional mode infarct in the right cerebellum. No evidence of acute large vascular territory infarct, acute hemorrhage, mass lesion, midline shift or hydrocephalus. Vascular: Calcific intracranial atherosclerosis. No hyperdense vessel identified. Skull: No acute fracture. Sinuses/Orbits: Left ethmoid air cell osteoma. No acute orbital findings. Other: No mastoid effusions. ASPECTS [facility] Stroke Program Early CT Score) total score (0-10 with 10 being normal): 10. IMPRESSION: 1. No evidence of acute intracranial abnormality.  ASPECTS is 10. 2. Chronic microvascular ischemic disease. Code stroke imaging results were communicated on 04/27/2022 at 10:27 am to provider Bhagat via secure text paging. Electronically  Signed   By: Margaretha Sheffield M.D.   On: 04/27/2022 10:27   Recent Labs    04/27/22 0612  WBC 9.9  HGB 8.6*  HCT 27.1*  PLT 344    Recent Labs    04/27/22 0612  NA 142  K 4.4  CL 105  CO2 26  GLUCOSE 109*  BUN 107*  CREATININE 5.55*  CALCIUM 8.8*     Intake/Output Summary (Last 24 hours) at 04/29/2022 0858 Last data filed at 04/28/2022 1830 Gross per 24 hour  Intake 420 ml  Output --  Net 420 ml         Physical Exam: Vital Signs Blood pressure 122/65, pulse 69, temperature 98.6 F (37 C), resp. rate 17, height _0  (1.676 m), weight 77 kg, last menstrual period 10/22/1978, SpO2 99 %.   General: No acute distress Mood and affect are appropriate Heart: Regular rate and rhythm no rubs murmurs or extra sounds Lungs: Clear to auscultation, breathing unlabored, no rales or wheezes Abdomen: Positive bowel sounds, soft nontender to palpation, nondistended Extremities: No clubbing, cyanosis, or edema Skin: No evidence of breakdown, no evidence of rash   Neuro:  Alert to person and place decreased memory, follows simple commands, CN 2-12 intact orther than L facial weakness,mild  L inattention, she has dysarthria- no aphasia  Sensation intact to LT in all 4 extremities Strength 5/5 in b/l UE and RLE, strength  4-/5 in proximal LLE and 4/5 in distal LLE  FTN intact b/l   Assessment/Plan: 1. Functional deficits which require 3+ hours per day of interdisciplinary therapy in a comprehensive inpatient rehab setting. Physiatrist is providing close team supervision and 24 hour management of active medical problems listed below. Physiatrist and rehab team continue to assess barriers to discharge/monitor patient progress toward functional and medical goals  Care Tool:  Bathing  Body parts bathed by patient: Right arm, Left arm, Abdomen, Chest, Front perineal area, Buttocks, Left upper leg, Right upper leg, Right lower leg, Face   Body parts bathed by helper: Left  lower leg     Bathing assist Assist Level: Minimal Assistance - Patient > 75%     Upper Body Dressing/Undressing Upper body dressing   What is the patient wearing?: Pull over shirt, Bra    Upper body assist Assist Level: Minimal Assistance - Patient > 75%    Lower Body Dressing/Undressing Lower body dressing      What is the patient wearing?: Underwear/pull up, Pants     Lower body assist Assist for lower body dressing: Moderate Assistance - Patient 50 - 74%     Toileting Toileting    Toileting assist Assist for toileting: Minimal Assistance - Patient > 75%     Transfers Chair/bed transfer  Transfers assist     Chair/bed transfer assist level: Minimal Assistance - Patient > 75%     Locomotion Ambulation   Ambulation assist      Assist level: Moderate Assistance - Patient 50 - 74% Assistive device: Walker-rolling Max distance: 90   Walk 10 feet activity   Assist     Assist level: Moderate Assistance - Patient - 50 - 74% Assistive device: Walker-rolling   Walk 50 feet activity   Assist    Assist level: Moderate Assistance - Patient - 50 - 74% Assistive device: Walker-rolling    Walk 150 feet activity   Assist Walk 150 feet activity did not occur: Safety/medical concerns         Walk 10 feet on uneven surface  activity   Assist Walk 10 feet on uneven surfaces activity did not occur: Safety/medical concerns         Wheelchair     Assist Is the patient using a wheelchair?: Yes Type of Wheelchair: Manual    Wheelchair assist level: Minimal Assistance - Patient > 75% Max wheelchair distance: 90    Wheelchair 50 feet with 2 turns activity    Assist        Assist Level: Minimal Assistance - Patient > 75%   Wheelchair 150 feet activity     Assist      Assist Level: Moderate Assistance - Patient 50 - 74%   Blood pressure 122/65, pulse 69, temperature 98.6 F (37 C), resp. rate 17, height _0  (1.676 m),  weight 77 kg, last menstrual period 10/22/1978, SpO2 99 %.  Medical Problem List and Plan: 1. Functional deficits secondary to right posterior limb internal capsule as well as left tiny periventricular white matter lacunar infarcts with dysarthria  Temporary worsening of deficits now resolved , likely hypoperfusion , reduced BP will adjust meds,  Repeat CT no acute changes, repeat MRI small RIght frontal infarct, extension of R PLIC infarct but neuro exam unchanged , may resume therapy today and will see if function was affected  Team conference today please see physician documentation under team conference tab, met with team  to discuss problems,progress, and goals. Formulized individual treatment plan based on medical history, underlying problem and comorbidities.   Given new infarcts while on DAPT will ask cardiology to place event monitor              -patient may shower             -ELOS/Goals: 7-10 days            2.  Antithrombotics: -DVT/anticoagulation:  Pharmaceutical:  Heparin- will d/c amb >1500' in PT             -antiplatelet therapy: aspirin 81 mg and Plavix for 3 weeks (until 05/07/22) then aspirin alone 3. Pain Management: Tylenol as needed 4. Mood/Behavior/Sleep: LCSW to evaluate and provide emotional support             -antipsychotic agents: none 5. Neuropsych/cognition: This patient does capable of making decisions on her own behalf. 6. Skin/Wound Care: Routine skin care checks 7. 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 Glucose stable on last BMP 10: Chronic BLE edema: minimal  currently -continue Lasix 80 mg q AM (40 mg q PM discontinued)             -continue compression stockings when OOB 11: Hypertension: monitor             -continue amlodipine 10 mg daily- reduce to 20m daily starting today             -continue carvedilol 12.5 mg BID- reduce to 6.255mBID starting today              -continue Lasix   Vitals:   04/28/22 2006 04/29/22 0449  BP: 125/66 122/65  Pulse: 72 69  Resp: 18 17  Temp: 98 F (36.7 C) 98.6 F (37 C)  SpO2: 100% 9999% Goal of systolic 12833-825ased on symptomatic hypotension when Sys BP was ~10054m 12: Diabetic neuropathy both feet: follows with podiatry 13: Hyperlipidemia: Crestor 20 mg daily 14: Anemia of chronic renal failure: received 4 transfusion of IV Ferrlecit; follow-up CBC  -10/9 HGB stable at 8.9    Latest Ref Rng & Units 04/27/2022    6:12 AM 04/23/2022    5:12 AM 04/22/2022    3:32 PM  CBC  WBC 4.0 - 10.5 K/uL 9.9  8.6  7.7   Hemoglobin 12.0 - 15.0 g/dL 8.6  8.1  8.0   Hematocrit 36.0 - 46.0 % 27.1  25.8  25.9   Platelets 150 - 400 K/uL 344  299  313     15: Gout flare: prednisone for 3 days 16: Obesity: dietary counseling 17: Bilateral carotid artery stenosis: outpatient surveillance 18. Dysphagia             -SLP eval, dys 3 diet with thin liquids  Eating well  19. Hypermagnesemia  -Mild asymptomatic,  avoid Mg supplements, recheck monday    LOS: 7 days A FACE TO FACE EVALUATION WAS PERFORMED  AndCharlett Blake/05/2022, 8:58 AM

## 2022-04-29 NOTE — Progress Notes (Signed)
Physical Therapy Session Note  Patient Details  Name: Gina Bailey MRN: [id number] Date of Birth: March 13, 1946  Today's Date: 04/29/2022 PT Individual Time: 1035-1130 and 1650-1729  PT Individual Time Calculation (min): 55 min and 39 min   Short Term Goals: Week 1:  PT Short Term Goal 1 (Week 1): Pt will transfer sup <> sit w/ CGA PT Short Term Goal 2 (Week 1): Pt will transfer sit to stand w/ min A PT Short Term Goal 3 (Week 1): Pt willamb 100' w/ RW and min A PT Short Term Goal 4 (Week 1): Pt will assess curb height step w/ AD   Skilled Therapeutic Interventions/Progress Updates:  Session 1  Pt received sitting in WC and agreeable to PT  Gait training through hall with RW and supervision assist. Gait training without AD  Dynamic gait training to weave through 6 cones x 3 with supervision assist and cues for visual scanning to the L. Forward/reverse gait 2 x 64f with RW and cues for posture and AD management.   Dynamic standing balance to perform reciprocal foot tap on 4inch step with BUE support x 10 and no UE support 10 with min assist without UE support due to ppor step length on the LLE  Transfer training with and without AD performed supervision assist with RW and CGA without Ad due to posterior bias initially and difficulty performing anterior weight shift while pushing from arm rests.   Nustep x 91m with supervision assist for safety and increased speed throughout as well as awareness of target completion time of 8 minutes.    [facility] and all needs met.     Session 2  Pt received sitting in WC and agreeable to PT  Gait training with RW 2 x 20092fith supervision assist and cues for direction and visual scanning to the L. Transfers with supervision assist throughout session with RW for sit<>stand and stand pivot. Dynamic balance and visual scanning standing on airex pad to reach for horse shoes on the L side  then toss with RUE. Pt then performed picking object up from floor with reacher x 8 and supervision assist from PT with 1 UE support on RW. Patient returned to room and [facility] and all needs met.        Therapy Documentation Precautions:  Precautions Precautions: Fall Precaution Comments: impulsive, L inattention Restrictions Weight Bearing Restrictions: No General:   Vital Signs:   Pain: Pain Assessment Pain Scale: 0-10 Pain Score: 0-No pain     Therapy/Group: Individual Therapy  AusLorie Phenix/05/2022, 11:39 AM

## 2022-04-29 NOTE — Progress Notes (Signed)
Patient ID: Gina Bailey, female   DOB: [birth year], 76 y.o.   MRN: [id number]  Family education Friday 9:30-12

## 2022-04-29 NOTE — Patient Care Conference (Signed)
Inpatient RehabilitationTeam Conference and Plan of Care Update Date: 04/29/2022   Time: 10:06 AM    Patient Name: Gina Bailey      Medical Record Number: [id number]  Date of Birth: 01-28-1946 Sex: Female         Room/Bed: 4M06C/4M06C-01 Payor Info: Payor: [employer] MEDICARE / Plan: [employer] MEDICARE CHOICE PPO / Product Type: *No Product type* /    Admit Date/Time:  04/22/2022  3:03 PM  Primary Diagnosis:  Acute ischemic stroke [facility])  Hospital Problems: Principal Problem:   Acute ischemic stroke [facility]) Active Problems:   Dysphagia   Anemia    Expected Discharge Date: Expected Discharge Date: 05/02/22  Team Members Present: Physician leading conference: Dr. Alysia Penna Social Worker Present: Erlene Quan, BSW Nurse Present: Dorien Chihuahua, RN PT Present: Barrie Folk, PT OT Present: Meriel Pica, OT SLP Present: Sherren Kerns, SLP PPS Coordinator present : Ileana Ladd, PT     Current Status/Progress Goal Weekly Team Focus  Bowel/Bladder   Continent/incontinent of b/b.  Reduce incontinent episodes.  Assist with toileting as needed   Swallow/Nutrition/ Hydration   dys 3 diet, thin liquids, sup A  sup A  tolerance of current diet with implementation of swallowing compensatory strategies, diet advancement as indicated/appropriate   ADL's   min A overall  supervision overall  ADL training, LUE NMR, L visual scanning, balance, awareness, pt education   Mobility   overly confident with slight reduction in awareness of impairments; Bed mobility = supervision/ CGA, Transfers = CGA/ supervision, Ambulation >200 ft using RW with CGA/ close supervision - does demo L lean and crowding of walker with fatigue  Overall Mod I/ supervision  activity tolerance, family educ, balance, quality of gait with and without AD, safety in mobility with decreased LOA   Communication   sup A  sup A  speech intelligibility   Safety/Cognition/ Behavioral Observations            Pain    No c/o pain at this time  Remain pain free  Assess Qshift and prn   Skin   Left arm fistula. Remaining of skin intact  Maintain skin integrity  Assess Qshift and prn     Discharge Planning:  Discharging home with assistance from son and DIL (CONE RN). Son works weekends and DIL works during the week   Team Discussion: Patient with episode of muffled speech; dehydration noted. EEG negative. Continue to noted left inattention and incoordination with visual field impairment post stroke.  Patient on target to meet rehab goals: yes, currently needs close CGA for toileting and min assist for lower body care. Completes transfers with supervision and ambulates up to 200' with CGA using a RW. Needs supervision when eating D3 thin diet due to pocketing issues and supervision for communication, maintaining vocal intensity.  Goals for discharge set for supervision overall.   *See Care Plan and progress notes for long and short-term goals.   Revisions to Treatment Plan:  N/a  Teaching Needs: Safety, medications, secondary risk management, transfers, toileting, etc  Current Barriers to Discharge: Decreased caregiver support  Possible Resolutions to Barriers: Family education HH follow up services DME: RW     Medical Summary               I attest that I was present, lead the team conference, and concur with the assessment and plan of the team.   Dorien Chihuahua B 04/29/2022, 2:37 PM

## 2022-04-29 NOTE — Progress Notes (Signed)
Occupational Therapy Session Note  Patient Details  Name: Gina Bailey MRN: [id number] Date of Birth: [birth year]  Today's Date: 04/29/2022 OT Individual Time: 0200-0230 OT Individual Time Calculation (min): 30 min    Short Term Goals: Week 1:  OT Short Term Goal 1 (Week 1): Pt will locate items on left side with min cues OT Short Term Goal 2 (Week 1): Pt will be able to comlete sit to stands with supervision OT Short Term Goal 3 (Week 1): Pt will be able to complete stand pivot transfer to toilet with CGA  Skilled Therapeutic Interventions/Progress Updates:     Pt received in room sitting in wc with DTR in law and son present. Pt reported need to urinate at beginning of session and ambulated to bathroom with close supervision using RW. Pt transferred to toilet with CGA and min verbal cueing for safety. Pt had a continent void and completed 3/3 toileting tasks with close supervision. Pt's family reported desire to learn more about completing transfers safely with Pt and were active participants throughout session. Pt and family were educated on toilet and tub transfers and demonstrated good teach back. Pt completed tub/shower transfer using tub bench with minimal assistance to bring legs over edge of tub. Pt and family educated on strategies for maintaining good skin integrity during transfers. Pt demonstrated improved use of visual scanning strategies during ambulation and was able to navigate room and hallway with minimal verbal cueing. Pt was left in room with family members present with seat belt alarm on, call bell in reach, and all needs met.   Therapy Documentation Precautions:  Precautions Precautions: Fall Precaution Comments: impulsive, L inattention Restrictions Weight Bearing Restrictions: No General:   Vital Signs: Therapy Vitals Temp: 97.8 F (36.6 C) Temp Source: Oral Pulse Rate: 72 Resp: 20 BP: 130/63 Patient Position (if appropriate): Sitting Oxygen  Therapy SpO2: 95 % O2 Device: Room Air Pain:   ADL: Toileting: Minimal cueing, Supervision/safety Where Assessed-Toileting: Glass blower/designer: Contact guard, Minimal verbal cueing Toilet Transfer Method: Counselling psychologist: Grab bars Tub/Shower Transfer: Minimal Museum/gallery conservator Method: Optometrist: Shower seat with back   Therapy/Group: Individual Therapy  Janey Genta 04/29/2022, 3:22 PM

## 2022-04-29 NOTE — Progress Notes (Signed)
Occupational Therapy Session Note  Patient Details  Name: Gina Bailey MRN: [id number] Date of Birth: [birth year]  Today's Date: 04/29/2022 OT Individual Time: 1031-2811 OT Individual Time Calculation (min): 60 min    Short Term Goals: Week 1:  OT Short Term Goal 1 (Week 1): Pt will locate items on left side with min cues OT Short Term Goal 2 (Week 1): Pt will be able to comlete sit to stands with supervision OT Short Term Goal 3 (Week 1): Pt will be able to complete stand pivot transfer to toilet with CGA  Skilled Therapeutic Interventions/Progress Updates:    Pt received sitting EOB finishing her breakfast. She was pocketing food on her L side, followed cues to clear food and she checked with her finger.  Pt used RW to ambulate to toilet with min cues to attend to L visual field.  Pt had small void, she needed CGA to stand from low toilet.  Pt able to stand and balance with Supervision to cleanse self.  Assisted pt with donning brief, pt then ambulated to w/c at sink to brush teeth, wash up and dress. Pt unable to shower today as she had just had ZIO patch applied yesterday less than 24 hrs ago.  BP 133/58 after toileting and ambulating to sink. Set up with UB dressing with cues to fully pull shirt over L arm and min with LB dressing with cues to start pants over L foot. Pt resting in wc with belt alarm on and all needs met.    Therapy Documentation Precautions:  Precautions Precautions: Fall Precaution Comments: impulsive, L inattention Restrictions Weight Bearing Restrictions: No    Vital Signs: Therapy Vitals Temp: 98.6 F (37 C) Pulse Rate: 69 Resp: 17 BP: 122/65 Patient Position (if appropriate): Lying Oxygen Therapy SpO2: 99 % O2 Device: Room Air Pain: no c/o pain          Therapy/Group: Individual Therapy  Alaysia Lightle 04/29/2022, 8:24 AM

## 2022-04-30 MED ORDER — SENNOSIDES-DOCUSATE SODIUM 8.6-50 MG PO TABS
2.0000 | ORAL_TABLET | Freq: Two times a day (BID) | ORAL | Status: DC
  Administered 2022-04-30 – 2022-05-02 (×5): 2 via ORAL
  Filled 2022-04-30 (×5): qty 2

## 2022-04-30 NOTE — Progress Notes (Addendum)
Patient ID: Gina Bailey, female   DOB: 23-Nov-1945, 76 y.o.   MRN: [id number]  [facility] referral sent to [facility] Patient approved, orders sent

## 2022-04-30 NOTE — Progress Notes (Signed)
Patient ID: Gina Bailey, female   DOB: 01-Aug-1945, 76 y.o.   MRN: [id number]  TTB ordered through [employer].

## 2022-04-30 NOTE — Discharge Instructions (Addendum)
Inpatient Rehab Discharge Instructions  Gina Bailey Discharge date and time: 05/02/2022   Activities/Precautions/ Functional Status: Activity: no lifting, driving, or strenuous exercise until cleared by MD Diet: cardiac diet; cut meats and solid food into small pieces; avoid conversation and distraction during meals Wound Care: none needed Functional status:  ___ No restrictions     ___ Walk up steps independently ___ 24/7 supervision/assistance   ___ Walk up steps with assistance _x__ Intermittent supervision/assistance  ___ Bathe/dress independently ___ Walk with walker     ___ Bathe/dress with assistance ___ Walk Independently    ___ Shower independently ___ Walk with assistance    __x_ Shower with assistance _x__ No alcohol     ___ Return to work/school ________  Special Instructions:  No driving, alcohol consumption or tobacco use.   Call Erlene Quan, LCSW at [phone] for information regarding your physical/occupational/speech therapy agency.   STROKE/TIA DISCHARGE INSTRUCTIONS SMOKING Cigarette smoking nearly doubles your risk of having a stroke & is the single most alterable risk factor  If you smoke or have smoked in the last 12 months, you are advised to quit smoking for your health. Most of the excess cardiovascular risk related to smoking disappears within a year of stopping. Ask you doctor about anti-smoking medications [place] Quit Line: 1-800-QUIT NOW Free Smoking Cessation Classes [phone]  CHOLESTEROL Know your levels; limit fat & cholesterol in your diet  Lipid Panel     Component Value Date/Time   CHOL 223 (H) 04/09/2022 0945   TRIG 58 04/09/2022 0945   HDL 78 04/09/2022 0945   CHOLHDL 2.9 04/09/2022 0945   CHOLHDL 2.3 05/22/2014 1635   VLDL 12 05/22/2014 1635   LDLCALC 135 (H) 04/09/2022 0945     Many patients benefit from treatment even if their cholesterol is at goal. Goal: Total Cholesterol (CHOL) less than 160 Goal:  Triglycerides  (TRIG) less than 150 Goal:  HDL greater than 40 Goal:  LDL (LDLCALC) less than 100   BLOOD PRESSURE American Stroke Association blood pressure target is less that 120/80 mm/Hg  Your discharge blood pressure is:  BP: (!) 150/68 Monitor your blood pressure Limit your salt and alcohol intake Many individuals will require more than one medication for high blood pressure  DIABETES (A1c is a blood sugar average for last 3 months) Goal HGBA1c is under 7% (HBGA1c is blood sugar average for last 3 months)  Diabetes: No known diagnosis of diabetes    Lab Results  Component Value Date   HGBA1C 5.4 04/09/2022    Your HGBA1c can be lowered with medications, healthy diet, and exercise. Check your blood sugar as directed by your physician Call your physician if you experience unexplained or low blood sugars.  PHYSICAL ACTIVITY/REHABILITATION Goal is 30 minutes at least 4 days per week  Activity: Increase activity slowly, Therapies: Physical Therapy: Outpatient, Occupational Therapy: Outpatient, and Speech Therapy: Outpatient Return to work: when cleared by MD Activity decreases your risk of heart attack and stroke and makes your heart stronger.  It helps control your weight and blood pressure; helps you relax and can improve your mood. Participate in a regular exercise program. Talk with your doctor about the best form of exercise for you (dancing, walking, swimming, cycling).  DIET/WEIGHT Goal is to maintain a healthy weight  Your discharge diet is:  Diet Order             DIET DYS 3 Room service appropriate? Yes; Fluid consistency: Thin  Diet effective now  thin liquids Your height is:  Height: '5\' 6"'$  (167.6 cm) Your current weight is: Weight: 77 kg Your Body Mass Index (BMI) is:  BMI (Calculated): 27.41 Following the type of diet specifically designed for you will help prevent another stroke. Your goal weight range is:   Your goal Body Mass Index (BMI) is 19-24. Healthy  food habits can help reduce 3 risk factors for stroke:  High cholesterol, hypertension, and excess weight.  RESOURCES Stroke/Support Group:  Call [phone]   STROKE EDUCATION PROVIDED/REVIEWED AND GIVEN TO PATIENT Stroke warning signs and symptoms How to activate emergency medical system (call 911). Medications prescribed at discharge. Need for follow-up after discharge. Personal risk factors for stroke. Pneumonia vaccine given: No Flu vaccine given: No My questions have been answered, the writing is legible, and I understand these instructions.  I will adhere to these goals & educational materials that have been provided to me after my discharge from the hospital.      My questions have been answered and I understand these instructions. I will adhere to these goals and the provided educational materials after my discharge from the hospital.  Patient/Caregiver Signature _______________________________ Date __________  Clinician Signature _______________________________________ Date __________  Please bring this form and your medication list with you to all your follow-up doctor's appointments.

## 2022-04-30 NOTE — Progress Notes (Signed)
Speech Language Pathology Daily Session Note  Patient Details  Name: Gina Bailey MRN: [id number] Date of Birth: 02-21-1946  Today's Date: 04/30/2022 SLP Individual Time: 1430-1500 SLP Individual Time Calculation (min): 30 min  Short Term Goals: Week 1: SLP Short Term Goal 1 (Week 1): Patient will consume current diet without overt s/sx of aspiration and self monitoring of oral residuals and/orL  buccal stasis with sup A verbal cues SLP Short Term Goal 2 (Week 1): Patient will implement speech intelligiblity strategies at the conversation level with sup A verbal cues to achieve >90% intelligibility  Skilled Therapeutic Interventions: Skilled ST treatment focused on swallowing goals and education with pt's daughter-in-law. SLP facilitated session by providing thorough education on pocketing and training on left lingual and/or finger sweep for clearance of pocketing in left buccal cavity. Pt consumed dysphagia 3 textures and effectively implemented lingual sweep to clear trace-to-mild L buccal stasis with sup A verbal/visual cues. Pt was seen implementing with mod I following this direct instruction. Daughter-in-law present and demonstrated understanding by providing effective verbal cueing as reminders when necessary. SLP also provided education on appropriate dysphagia 3 textures vs. difficult foods list. DIL verbalized understanding through teach back. Recommend continuation of current diet, as well as at discharge. Anticipate pt will be able to progress to regular textures at home at discharge. SLP educated DIL regarding signs/symptoms indicative of normal oropharyngeal swallow vs. suspected impairment/difficulty with tolerance. DIL verbalized understanding through teach back. Of note - pt met speech goals at sup A level and was perceived as 100% intelligible at the conversation level with intermittent need to increase vocal intensity. Patient was left in wheelchair with alarm activated and  immediate needs within reach at end of session. Continue per current plan of care.      Pain  None/denied  Therapy/Group: Individual Therapy  Patty Sermons 04/30/2022, 4:46 PM

## 2022-04-30 NOTE — Progress Notes (Signed)
PROGRESS NOTE   Subjective/Complaints:  No new issues but still has constipation, remains verbose with mild dysarthria  Review of Systems  Constitutional:  Negative for chills and fever.  HENT:  Negative for congestion.   Eyes:  Negative for double vision.  Respiratory:  Negative for cough.   Cardiovascular:  Negative for chest pain and palpitations.  Gastrointestinal:  Negative for abdominal pain, diarrhea, heartburn, nausea and vomiting.  Genitourinary: Negative.   Musculoskeletal: Negative.   Neurological:  Positive for weakness. Negative for dizziness.     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 EEG to evaluate for seizure Level of alertness: Awake AEDs during EEG study: None Technical aspects: This EEG study was done with scalp electrodes positioned according to the 10-20 International system of electrode placement. Electrical activity was reviewed with band pass filter of 1-'70Hz'$ , sensitivity of 7 uV/mm, display speed of 15m/sec with a '60Hz'$  notched filter applied as appropriate. EEG data were recorded continuously and digitally stored.  Video monitoring was available and reviewed as appropriate. Description: The posterior dominant rhythm consists of 8-9 Hz activity of moderate voltage (25-35 uV) seen predominantly in posterior head regions, symmetric and reactive to eye opening and eye closing. EEG showed intermittent rhythmic 2-'3Hz'$  delta slowing in right temporal region.  Hyperventilation and photic stimulation were not performed.   ABNORMALITY - Intermittent rhythmic slow, right temporal region IMPRESSION: This study is suggestive of cortical dysfunction arising from right temporal region  likely secondary to underlying structural abnormality. No seizures or epileptiform discharges were seen throughout the recording. Priyanka OBarbra Sarks  No results for input(s): "WBC", "HGB", "HCT", "PLT" in the last 72 hours.  No results for input(s): "NA", "K", "CL", "CO2", "GLUCOSE", "BUN", "CREATININE", "CALCIUM" in the last 72 hours.   Intake/Output Summary (Last 24 hours) at 04/30/2022 0738 Last data filed at 04/29/2022 1700 Gross per 24 hour  Intake 476 ml  Output --  Net 476 ml         Physical Exam: Vital Signs Blood pressure (!) 147/73, pulse 71, temperature 97.6 F (36.4 C), resp. rate 18, height '5\' 6"'$  (1.676 m), weight 77 kg, last menstrual period 10/22/1978, SpO2 100 %.   General: No acute distress Mood and affect are appropriate Heart: Regular rate and rhythm no rubs murmurs or extra sounds Lungs: Clear to auscultation, breathing unlabored, no rales or wheezes Abdomen: Positive bowel sounds, soft nontender to palpation, nondistended Extremities: No clubbing, cyanosis, or edema Skin: No evidence of breakdown, no evidence of rash   Neuro:  Alert to person and place decreased memory, follows simple commands, CN 2-12 intact orther than L facial weakness,mild  L inattention, she has dysarthria- no aphasia  Sensation intact to LT in all 4 extremities Strength  5/5 in b/l UE and RLE, strength  4-/5 in proximal LLE and 4/5 in distal LLE  FTN intact b/l   Assessment/Plan: 1. Functional deficits which require 3+ hours per day of interdisciplinary therapy in a comprehensive inpatient rehab setting. Physiatrist is providing close team supervision and 24 hour management of active medical problems listed below. Physiatrist and rehab team continue to assess  barriers to discharge/monitor patient progress toward functional and medical goals  Care Tool:  Bathing    Body parts bathed by patient: Right arm, Left arm, Abdomen, Chest, Front perineal area, Buttocks, Left upper leg, Right upper leg, Right lower leg, Face   Body parts bathed by helper: Left lower leg     Bathing assist Assist Level: Minimal Assistance - Patient > 75%     Upper Body Dressing/Undressing Upper body dressing   What is the patient wearing?: Pull over shirt, Bra    Upper body assist Assist Level: Minimal Assistance - Patient > 75%    Lower Body Dressing/Undressing Lower body dressing      What is the patient wearing?: Underwear/pull up, Pants     Lower body assist Assist for lower body dressing: Moderate Assistance - Patient 50 - 74%     Toileting Toileting    Toileting assist Assist for toileting: Minimal Assistance - Patient > 75%     Transfers Chair/bed transfer  Transfers assist     Chair/bed transfer assist level: Minimal Assistance - Patient > 75%     Locomotion Ambulation   Ambulation assist      Assist level: Moderate Assistance - Patient 50 - 74% Assistive device: Walker-rolling Max distance: 90   Walk 10 feet activity   Assist     Assist level: Moderate Assistance - Patient - 50 - 74% Assistive device: Walker-rolling   Walk 50 feet activity   Assist    Assist level: Moderate Assistance - Patient - 50 - 74% Assistive device: Walker-rolling    Walk 150 feet activity   Assist Walk 150 feet activity did not occur: Safety/medical concerns         Walk 10 feet on uneven surface  activity   Assist Walk 10 feet on uneven surfaces activity did not occur: Safety/medical concerns         Wheelchair     Assist Is the patient using a wheelchair?: Yes Type of Wheelchair: Manual    Wheelchair assist level: Minimal Assistance - Patient > 75% Max wheelchair distance: 90    Wheelchair 50 feet with 2  turns activity    Assist        Assist Level: Minimal Assistance - Patient > 75%   Wheelchair 150 feet activity     Assist      Assist Level: Moderate Assistance - Patient 50 - 74%   Blood pressure (!) 147/73, pulse 71, temperature 97.6 F (36.4 C), resp. rate 18, height '5\' 6"'$  (1.676 m), weight 77 kg, last menstrual period 10/22/1978, SpO2 100 %.  Medical Problem List and Plan: 1. 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  Antithrombotics: -DVT/anticoagulation:  Pharmaceutical: Heparin- will d/c amb >1500' in PT             -antiplatelet therapy: aspirin 81 mg and Plavix for 3 weeks (until 05/07/22) then aspirin alone 3. Pain Management: Tylenol as needed 4. Mood/Behavior/Sleep: LCSW to evaluate and provide emotional support             -antipsychotic agents: none 5. Neuropsych/cognition: This patient does capable of making decisions on her own behalf. 6. Skin/Wound Care: Routine skin care checks 7. 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 Glucose stable on last BMP 10: Chronic BLE edema: minimal currently -continue Lasix 80 mg q AM (40 mg q PM  discontinued)             -continue compression stockings when OOB 11: Hypertension: monitor             -continue amlodipine 10 mg daily- reduce to '5mg'$  daily starting today             -continue carvedilol 12.5 mg BID- reduce to 6.'25mg'$  BID starting today              -continue Lasix   Vitals:   04/29/22 1829 04/30/22 0358  BP: 126/62 (!) 147/73  Pulse: 66 71  Resp: 18 18  Temp: 98.2 F (36.8 C) 97.6 F (36.4 C)  SpO2: 100% 794%   Goal of systolic 801-655 based on symptomatic hypotension when Sys BP was ~157mHg 12: Diabetic neuropathy both feet: follows with podiatry 13: Hyperlipidemia: Crestor 20 mg daily 14: Anemia of chronic renal failure: received 4 transfusion of IV Ferrlecit; follow-up CBC  -10/9 HGB stable at 8.9    Latest Ref Rng & Units 04/27/2022    6:12 AM 04/23/2022    5:12 AM 04/22/2022    3:32 PM  CBC  WBC 4.0 - 10.5 K/uL 9.9  8.6  7.7   Hemoglobin 12.0 - 15.0 g/dL 8.6  8.1  8.0   Hematocrit 36.0 - 46.0 % 27.1  25.8  25.9   Platelets 150 - 400 K/uL 344  299  313     15: Gout flare: prednisone for 3 days 16: Obesity: dietary counseling 17: Bilateral carotid artery stenosis: outpatient surveillance 18. Dysphagia             -SLP eval, dys 3 diet with thin liquids  Eating well  19. Hypermagnesemia  -Mild asymptomatic,  avoid Mg supplements, recheck persistent mild elevation   LOS: 8 days A FACE TO FACE EVALUATION WAS PERFORMED  ACharlett Blake10/06/2022, 7:38 AM

## 2022-04-30 NOTE — Discharge Summary (Signed)
Physician Discharge Summary  Patient ID: Gina Bailey MRN: [id number] DOB/AGE: [birth year] 76 y.o.  Admit date: 04/22/2022 Discharge date: 05/01/2022  Discharge Diagnoses:  Principal Problem:   Acute ischemic stroke [facility]) Active Problems:   Dysphagia   Anemia Chronic kidney disease stage V Type 2 diabetes mellitus Chronic bilateral lower extremity edema Hypertension Diabetic neuropathy Hyperlipidemia Anemia of chronic renal failure Gout flare Obesity Bilateral carotid artery stenosis Dysphagia Right groin infection Urinary frequency/hesitancy  Discharged Condition: stable  Significant Diagnostic Studies: EEG adult  Result Date: 07-May-2022 Lora Havens, MD     07-May-2022  8:57 AM Patient Name: Gina Bailey MRN: [id number] Epilepsy Attending: Lora Havens Referring Physician/Provider: Katy Apo, NP Date: 07-May-2022 Duration: 22.36 mins Patient history: 76 year old patient with HTN, HLD, CKD, HLD, HTN and recent strokes in the right lentiform nucleus and left posterior occipital horn presents today with sudden onset left sided weakness, aphasia and dysarthria. EEG to evaluate for seizure Level of alertness: Awake AEDs during EEG study: None Technical aspects: This EEG study was done with scalp electrodes positioned according to the 10-20 International system of electrode placement. Electrical activity was reviewed with band pass filter of 1-_0 , sensitivity of 7 uV/mm, display speed of 42m/sec with a _1  notched filter applied as appropriate. EEG data were recorded continuously and digitally stored.  Video monitoring was available and reviewed as appropriate. Description: The posterior dominant rhythm consists of 8-9 Hz activity of moderate voltage (25-35 uV) seen predominantly in posterior head regions, symmetric and reactive to eye opening and eye closing. EEG showed intermittent rhythmic 2-_2  delta slowing in right temporal region. Hyperventilation and  photic stimulation were not performed.   ABNORMALITY - Intermittent rhythmic slow, right temporal region IMPRESSION: This study is suggestive of cortical dysfunction arising from right temporal region  likely secondary to underlying structural abnormality. No seizures or epileptiform discharges were seen throughout the recording. PLora Havens  MR BRAIN WO CONTRAST  Result Date: 04/27/2022 CLINICAL DATA:  Neuro deficit, acute, stroke suspected. Recent right lacunar stroke, worsening left leg weakness today. EXAM: MRI HEAD WITHOUT CONTRAST MRA HEAD WITHOUT CONTRAST TECHNIQUE: Multiplanar, multi-echo pulse sequences of the brain and surrounding structures were acquired without intravenous contrast. Angiographic images of the Circle of Willis were acquired using MRA technique without intravenous contrast. COMPARISON:  Head CT 04/27/2022.  Head MRI and MRA 04/16/2022. FINDINGS: MRI HEAD FINDINGS Brain: A recent infarct in the posterior limb of the right internal capsule demonstrates persistent restricted diffusion and appears slightly larger than on the prior MRI. There is a new punctate acute infarct in the subcortical white matter of the anteromedial right frontal lobe. A small infarct involving the periventricular white matter of the left occipital lobe on the prior MRI demonstrates decreased diffusion weighted signal abnormality. Punctate foci of subtle trace diffusion weighted signal hyperintensity in the medial right parietal subcortical white matter and left corona radiata were also present on the prior MRI and are without reduced ADC. Confluent T2 hyperintensities elsewhere in the cerebral white matter bilaterally and patchy T2 hyperintensities in the pons are similar to the prior MRI and are nonspecific but compatible with extensive chronic small vessel ischemic disease. Small chronic infarcts are again noted in the basal ganglia, right thalamus, and right cerebellar hemisphere. No intracranial  hemorrhage, mass, midline shift, or extra-axial fluid collection is identified. There is mild generalized cerebral atrophy. Vascular: Major intracranial vascular flow voids are preserved. Skull and upper cervical spine: Unremarkable  bone marrow signal. Sinuses/Orbits: Bilateral cataract extraction. Paranasal sinuses and mastoid air cells are clear. Other: None. MRA HEAD FINDINGS Anterior circulation: The internal carotid arteries are widely patent from skull base to carotid termini. ACAs and MCAs are patent without evidence of a proximal branch occlusion or significant A1 or M1 stenosis. Detail branch vessel evaluation is limited by motion. No aneurysm is identified. Posterior circulation: The included intracranial vertebral arteries are patent to the basilar. The basilar artery is widely patent. Posterior communicating arteries are diminutive or absent. Both PCAs are patent without evidence of a significant proximal stenosis. No aneurysm is identified. Anatomic variants: None. IMPRESSION: 1. 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 2. Slightly increased size of recent infarct in the posterior limb of the right internal capsule. 3. Extensive chronic small vessel ischemic disease. 4. Motion degraded head MRA without evidence of a large vessel occlusion or significant proximal stenosis. Electronically Signed   By: Logan Bores M.D.   On: 04/27/2022 15:35   MR ANGIO HEAD WO CONTRAST  Result Date: 04/27/2022 CLINICAL DATA:  Neuro deficit, acute, stroke suspected. Recent right lacunar stroke, worsening left leg weakness today. EXAM: MRI HEAD WITHOUT CONTRAST MRA HEAD WITHOUT CONTRAST TECHNIQUE: Multiplanar, multi-echo pulse sequences of the brain and surrounding structures were acquired without intravenous contrast. Angiographic images of the Circle of Willis were acquired using MRA technique without intravenous contrast. COMPARISON:  Head CT 04/27/2022.  Head MRI and MRA 04/16/2022. FINDINGS:  MRI HEAD FINDINGS Brain: A recent infarct in the posterior limb of the right internal capsule demonstrates persistent restricted diffusion and appears slightly larger than on the prior MRI. There is a new punctate acute infarct in the subcortical white matter of the anteromedial right frontal lobe. A small infarct involving the periventricular white matter of the left occipital lobe on the prior MRI demonstrates decreased diffusion weighted signal abnormality. Punctate foci of subtle trace diffusion weighted signal hyperintensity in the medial right parietal subcortical white matter and left corona radiata were also present on the prior MRI and are without reduced ADC. Confluent T2 hyperintensities elsewhere in the cerebral white matter bilaterally and patchy T2 hyperintensities in the pons are similar to the prior MRI and are nonspecific but compatible with extensive chronic small vessel ischemic disease. Small chronic infarcts are again noted in the basal ganglia, right thalamus, and right cerebellar hemisphere. No intracranial hemorrhage, mass, midline shift, or extra-axial fluid collection is identified. There is mild generalized cerebral atrophy. Vascular: Major intracranial vascular flow voids are preserved. Skull and upper cervical spine: Unremarkable bone marrow signal. Sinuses/Orbits: Bilateral cataract extraction. Paranasal sinuses and mastoid air cells are clear. Other: None. MRA HEAD FINDINGS Anterior circulation: The internal carotid arteries are widely patent from skull base to carotid termini. ACAs and MCAs are patent without evidence of a proximal branch occlusion or significant A1 or M1 stenosis. Detail branch vessel evaluation is limited by motion. No aneurysm is identified. Posterior circulation: The included intracranial vertebral arteries are patent to the basilar. The basilar artery is widely patent. Posterior communicating arteries are diminutive or absent. Both PCAs are patent without  evidence of a significant proximal stenosis. No aneurysm is identified. Anatomic variants: None. IMPRESSION: 1. 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 2. Slightly increased size of recent infarct in the posterior limb of the right internal capsule. 3. Extensive chronic small vessel ischemic disease. 4. Motion degraded head MRA without evidence of a large vessel occlusion or significant proximal stenosis.  Electronically Signed   By: Logan Bores M.D.   On: 04/27/2022 15:35   CT HEAD CODE STROKE WO CONTRAST  Result Date: 04/27/2022 CLINICAL DATA:  Code stroke.  Neuro deficit, acute, stroke suspected EXAM: CT HEAD WITHOUT CONTRAST TECHNIQUE: Contiguous axial images were obtained from the base of the skull through the vertex without intravenous contrast. RADIATION DOSE REDUCTION: This exam was performed according to the departmental dose-optimization program which includes automated exposure control, adjustment of the mA and/or kV according to patient size and/or use of iterative reconstruction technique. COMPARISON:  MRI head 04/08/2022. FINDINGS: Brain: Remote infarct in the right thalamus and patchy white matter hypodensities, nonspecific but compatible with chronic microvascular ischemic disease. Additional mode infarct in the right cerebellum. No evidence of acute large vascular territory infarct, acute hemorrhage, mass lesion, midline shift or hydrocephalus. Vascular: Calcific intracranial atherosclerosis. No hyperdense vessel identified. Skull: No acute fracture. Sinuses/Orbits: Left ethmoid air cell osteoma. No acute orbital findings. Other: No mastoid effusions. ASPECTS [facility] Stroke Program Early CT Score) total score (0-10 with 10 being normal): 10. IMPRESSION: 1. No evidence of acute intracranial abnormality.  ASPECTS is 10. 2. Chronic microvascular ischemic disease. Code stroke imaging results were communicated on 04/27/2022 at 10:27 am to provider Bhagat via secure text paging.  Electronically Signed   By: Margaretha Sheffield M.D.   On: 04/27/2022 10:27   VAS US CAROTID  Result Date: 04/17/2022 Carotid Arterial Duplex Study Patient Name:  Gina Bailey  Date of Exam:   04/17/2022 Medical Rec #: [id number]        Accession #:    [id number] Date of Birth: 05/14/1946         Patient Gender: F Patient Age:   20 years Exam Location:  [facility] Procedure:      VAS US CAROTID Referring Phys: Janine Ores --------------------------------------------------------------------------------  Indications:       CVA. Risk Factors:      Hypertension, hyperlipidemia. Comparison Study:  No prior studies. Performing Technologist: Oliver Hum RVT  Examination Guidelines: A complete evaluation includes B-mode imaging, spectral Doppler, color Doppler, and power Doppler as needed of all accessible portions of each vessel. Bilateral testing is considered an integral part of a complete examination. Limited examinations for reoccurring indications may be performed as noted.  Right Carotid Findings: +----------+--------+--------+--------+-----------------------+--------+           PSV cm/sEDV cm/sStenosisPlaque Description     Comments +----------+--------+--------+--------+-----------------------+--------+ CCA Prox  73      10              smooth and heterogenous         +----------+--------+--------+--------+-----------------------+--------+ CCA Distal72      14              smooth and heterogenous         +----------+--------+--------+--------+-----------------------+--------+ ICA Prox  49      9               smooth and heterogenoustortuous +----------+--------+--------+--------+-----------------------+--------+ ICA Distal79      15                                     tortuous +----------+--------+--------+--------+-----------------------+--------+ ECA       69      7                                                +----------+--------+--------+--------+-----------------------+--------+ +----------+--------+-------+--------+-------------------+  PSV cm/sEDV cmsDescribeArm Pressure (mmHG) +----------+--------+-------+--------+-------------------+ [id number]                                         +----------+--------+-------+--------+-------------------+ +---------+--------+--+--------+-+---------+ VertebralPSV cm/s57EDV cm/s9Antegrade +---------+--------+--+--------+-+---------+  Left Carotid Findings: +----------+--------+--------+--------+-----------------------+--------+           PSV cm/sEDV cm/sStenosisPlaque Description     Comments +----------+--------+--------+--------+-----------------------+--------+ CCA Prox  61      10              smooth and heterogenous         +----------+--------+--------+--------+-----------------------+--------+ CCA Distal60      9               smooth and heterogenous         +----------+--------+--------+--------+-----------------------+--------+ ICA Prox  364     42      40-59%  smooth and heterogenoustortuous +----------+--------+--------+--------+-----------------------+--------+ ICA Mid   49      13                                     tortuous +----------+--------+--------+--------+-----------------------+--------+ ICA Distal53      15                                     tortuous +----------+--------+--------+--------+-----------------------+--------+ ECA       267     9                                               +----------+--------+--------+--------+-----------------------+--------+ +----------+--------+--------+--------+-------------------+           PSV cm/sEDV cm/sDescribeArm Pressure (mmHG) +----------+--------+--------+--------+-------------------+ Subclavian300                                         +----------+--------+--------+--------+-------------------+  +---------+--------+--+--------+-+---------+ VertebralPSV cm/s49EDV cm/s9Antegrade +---------+--------+--+--------+-+---------+   Summary: Right Carotid: Velocities in the right ICA are consistent with a 1-39% stenosis. Left Carotid: Velocities in the left ICA are consistent with a 40-59% stenosis. Vertebrals: Bilateral vertebral arteries demonstrate antegrade flow. *See table(s) above for measurements and observations.  Electronically signed by Antony Contras MD on 04/17/2022 at 2:26:29 PM.    Final     Labs:  Basic Metabolic Panel: Recent Labs  Lab 04/27/22 0612  NA 142  K 4.4  CL 105  CO2 26  GLUCOSE 109*  BUN 107*  CREATININE 5.55*  CALCIUM 8.8*  MG 2.9*    CBC: Recent Labs  Lab 04/27/22 0612  WBC 9.9  HGB 8.6*  HCT 27.1*  MCV 85.0  PLT 344    CBG: Recent Labs  Lab 04/27/22 1004  GLUCAP 152*     Brief HPI:   Gina Bailey is a 76 y.o. female who presented to the emergency department on 04/15/2022 complaining of left facial droop, slurred speech.  MRI with findings of right posterior limb internal capsule as well as left tiny periventricular white matter lacunar infarcts.  No large vessel stenosis or occlusion on MR angiogram.  Neurology consulted.  Etiology likely secondary to chronic small vessel disease.  Aspirin 81 mg and clopidogrel 75 mg daily for 3 weeks with recommendations to continue aspirin alone.  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 CKD followed by [facility].   Hospital Course: Gina Bailey was admitted to rehab 04/22/2022 for inpatient therapies to consist of PT, ST and OT at least three hours five days a week. Past admission physiatrist, therapy team and rehab RN have worked together to provide customized collaborative inpatient rehab. Tolerating dysphagia 3 diet. Hemoglobin stable. Concern for mental status changes on 10/9. Code stroke called and neurology evaluated. Patient back to baseline.  CT head performed without acute  abnormalities.  MRI/MRA/EEG obtained.  Temporary worsening of deficits now resolved and likely secondary to hypoperfusion.  Blood pressure medicines adjusted.  MRI shows new punctate stroke in right frontal white matter as well as increased size of infarct in the right internal capsule, concerning for embolic source.  ZIO AT monitor for surveillance of atrial fibrillation arranged via electrophysiology department.  Continue DAPT for additional 3 weeks through 10/29 then aspirin alone unless indication for anticoagulation is found.  At which point antiplatelet should be stopped from a neurological perspective.  Aspirin 81 mg can be continued if the patient has CAD indication. She complained of urinary frequnecy and hesitancy on 10/13. UA negative for infection. She reported tender right groin nodule draining clear fluid. Started on doxycyline. Culture obtained. Proteus mirabilis isolated. (Changed to Keflex 500 mg BID for 7 days. Called in after discharge.)  Blood pressures were monitored on TID basis and continued on amlodipine 10 mg daily, carvedilol 12.5 mg BID. Lasix 80 mg daily continued for LE edema.  Amlodipine reduced to 5 mg daily and carvedilol reduced to 6.25 mg twice daily 10/10.  Rehab course: During patient's stay in rehab weekly team conferences were held to monitor patient's progress, set goals and discuss barriers to discharge. At admission, patient required mod assist with mobility and with basic self-care skills.   She has had improvement in activity tolerance, balance, postural control as well as ability to compensate for deficits. She has had improvement in functional use LLE as well as improvement in awareness. Patient ambulated >1500 feet on 10/11.   04/28/2022: Called by neurology NP to help facilitate 2 week Zio AT monitor for surveillance of Afib in a pt w/cryptogenic stroke . The monitor has been ordered and I have notified EKG department for application. Tommye Standard, PA-C             Disposition: Home There are no questions and answers to display.         Diet: heart healthy/dysphagia 3 thin  Special Instructions: No driving, alcohol consumption or tobacco use.   Follow-up carotid duplex ultrasound in one year.  30-35 minutes were spent on discharge planning and discharge summary.   Discharge Instructions     Ambulatory referral to Neurology   Complete by: As directed    An appointment is requested in approximately: 2 weeks   Ambulatory referral to Physical Medicine Rehab   Complete by: As directed    Hospital follow-up      Allergies as of 05/01/2022       Reactions   Metformin And Related Other (See Comments)   Could not tolerate metformin due to GI side effects   Pineapple Itching, Swelling   Zestril [lisinopril] Swelling        Medication List     STOP taking these medications    potassium chloride 10 MEQ CR  capsule Commonly known as: MICRO-K   predniSONE 20 MG tablet Commonly known as: DELTASONE       TAKE these medications    Accu-Chek FastClix Lancets Misc USE ONCE DAILY AS DIRECTED TO MONITOR BLOOD SUGAR   Accu-Chek Guide test strip Generic drug: glucose blood Check blood sugar 7 times a week. diag code E11.9. Non insulin dependent   Accu-Chek Nano SmartView w/Device Kit 1 each by Does not apply route 1 day or 1 dose. Uses to check blood sugar once daily in the morning.   acetaminophen 325 MG tablet Commonly known as: TYLENOL Take 1-2 tablets (325-650 mg total) by mouth every 4 (four) hours as needed for mild pain.   amLODipine 5 MG tablet Commonly known as: NORVASC Take 1 tablet (5 mg total) by mouth daily. What changed:  medication strength how much to take   aspirin EC 81 MG tablet Take 1 tablet (81 mg total) by mouth daily. Swallow whole.   calcitRIOL 0.25 MCG capsule Commonly known as: ROCALTROL Take 1 capsule (0.25 mcg total) by mouth 3 (three) times a week.   carvedilol 6.25 MG  tablet Commonly known as: COREG Take 1 tablet (6.25 mg total) by mouth 2 (two) times daily with a meal. What changed:  medication strength how much to take   clopidogrel 75 MG tablet Commonly known as: PLAVIX Take 1 tablet (75 mg total) by mouth daily for 14 days. Start taking on: May 03, 2022 What changed: These instructions start on May 03, 2022. If you are unsure what to do until then, ask your doctor or other care provider.   furosemide 80 MG tablet Commonly known as: LASIX Take 1 tablet (80 mg total) by mouth daily. What changed:  medication strength how much to take how to take this when to take this additional instructions   rosuvastatin 20 MG tablet Commonly known as: CRESTOR Take 1 tablet (20 mg total) by mouth daily.   senna-docusate 8.6-50 MG tablet Commonly known as: Senokot-S Take 2 tablets by mouth 2 (two) times daily.        Follow-up Information     Kirsteins, Luanna Salk, MD Follow up.   Specialty: Physical Medicine and Rehabilitation Why: office will call you to arrange your appt (sent) Contact information: [place] [address] [phone]         [facility] Follow up.   Why: Call in 1-2 days to make arrangements for hospital follow-up. Contact information: [address]     [address] [place] [address] [phone]        Angelica Pou, MD Follow up.   Specialty: Internal Medicine Why: Call in 1-2 days to make arrangements for hospital follow-up. Contact information: [address] [place] [address] [phone]                 Signed: Barbie Banner 05/01/2022, 1:11 PM

## 2022-04-30 NOTE — Progress Notes (Signed)
Occupational Therapy Session Note  Patient Details  Name: Gina Bailey MRN: [id number] Date of Birth: [birth year]  Today's Date: 04/30/2022 OT Individual Time: 0800-0900 OT Individual Time Calculation (min): 60 min    Short Term Goals: Week 1:  OT Short Term Goal 1 (Week 1): Pt will locate items on left side with min cues OT Short Term Goal 2 (Week 1): Pt will be able to comlete sit to stands with supervision OT Short Term Goal 3 (Week 1): Pt will be able to complete stand pivot transfer to toilet with CGA  Skilled Therapeutic Interventions/Progress Updates:     Session 1: Pt received in room laying in bed motivated to participate in therapy today. Pt was able to transfer from her bed to her wc with CGA and minimal verbal cues for safety when reaching for wc armrests. Pt was able to pick out an appropriate outfit for the day including bra, shirt, underwear, socks, and shoes. Pt was transported to tub/shower bathroom in wc in order to practice bathing using a tub bench in a tub similar to her home environment. Pt transferred to and from the tub bench with min A and moderate verbal cueing. Pt was able to follow verbal commands and demonstrated insight to importance of shower safety and fall prevention in a slippery environment. Pt was able to bathe self with CGA when reaching towards feet and when standing to wash bottom while holding grab bar. Pt completed U/LB dressing while seated in wheelchair. Pt was able to donn shirt with CGA and pants with min A for weaving feet. Pt stood to pull pants over her waist with CGA. Pt was transported back to her room in her wc. When in her room, Pt implemented visual scanning technique to find deodorant located on left side of her table with minimal verbal cues. Pt was left in her room sitting in her wc with nursing present, seatbelt alarm on, call bell in reach, and all needs met.   Session 2: Pt received in her room sitting in her wc with family members, DTR  and son present. Both family members reported they are feeling more confident in taking the Pt home since receiving education from PT and OT. Pt reported need to using bathroom upon OT arrival and ambulated to bathroom with CGA, Pt completed toilet transfer with CGA and one verbal cue for safety. Pt had continent bowel and bladder movement and completed peri care with CGA and clothing management standing at toilet with min A. Pt then walked from her room to the therapy gym with her RW CGA. In therapy gym, Pt completed visual scanning and problem solving activity with medium sized pegs on peg board. Pt required min verbal cues to scan towards left side to find correct color pegs. Pt was able to maintain standing balance while using bilateral hands to pull apart and place pegs in board. Next pt participated in bean bag toss activity while completing sit to stands from mat table. Pt was able to visually scan around the table to find the correct color bean bag to throw with min verbal cues. Pt completed ~12 sit to stands during tasks and maintained dynamic standing balance with CGA. Pt was attentive during both therapeutic tasks and demonstrated improved standing balance, activity tolerance, and improved application of visual scanning strategies. Pt ambulated back to her room and was left in her wc with seat belt alarm on, call bell in reach, and all needs met.   Therapy Documentation Precautions:  Precautions Precautions: Fall Precaution Comments: impulsive, L inattention Restrictions Weight Bearing Restrictions: No   ADL: ADL Upper Body Bathing: Contact guard Where Assessed-Upper Body Bathing: Shower Lower Body Bathing: Contact guard, Minimal cueing Where Assessed-Lower Body Bathing: Shower Upper Body Dressing: Contact guard Where Assessed-Upper Body Dressing: Wheelchair Lower Body Dressing: Minimal assistance Where Assessed-Lower Body Dressing: Wheelchair Toileting: Contact guard Where  Assessed-Toileting: Glass blower/designer: Contact guard, Minimal verbal cueing Toilet Transfer Method: Counselling psychologist: Energy manager: Moderate cueing, Minimal assistance Tub/Shower Transfer Method: Ambulating Tub/Shower Equipment: Shower seat with back Social research officer, government: Minimal assistance, Moderate cueing Social research officer, government Method: Heritage manager: Shower seat without back    Therapy/Group: Individual Therapy  Kassy Mcenroe A Laynee Lockamy,OTR/L 04/30/2022, 12:12 PM

## 2022-05-01 ENCOUNTER — Other Ambulatory Visit: Payer: Self-pay | Admitting: Physician Assistant

## 2022-05-01 LAB — URINALYSIS, ROUTINE W REFLEX MICROSCOPIC
Bacteria, UA: NONE SEEN
Bilirubin Urine: NEGATIVE
Glucose, UA: NEGATIVE mg/dL
Hgb urine dipstick: NEGATIVE
Ketones, ur: NEGATIVE mg/dL
Leukocytes,Ua: NEGATIVE
Nitrite: NEGATIVE
Protein, ur: 100 mg/dL — AB
Specific Gravity, Urine: 1.009 (ref 1.005–1.030)
pH: 6 (ref 5.0–8.0)

## 2022-05-01 MED ORDER — SENNOSIDES-DOCUSATE SODIUM 8.6-50 MG PO TABS: 2.0000 | ORAL_TABLET | Freq: Two times a day (BID) | ORAL | 1 refills | Status: DC

## 2022-05-01 MED ORDER — ROSUVASTATIN CALCIUM 20 MG PO TABS: 20.0000 mg | ORAL_TABLET | Freq: Every day | ORAL | 0 refills | Status: DC

## 2022-05-01 MED ORDER — FUROSEMIDE 80 MG PO TABS: 80.0000 mg | ORAL_TABLET | Freq: Every day | ORAL | 0 refills | Status: DC

## 2022-05-01 MED ORDER — DOXYCYCLINE HYCLATE 100 MG PO TABS
100.0000 mg | ORAL_TABLET | Freq: Two times a day (BID) | ORAL | Status: DC
  Administered 2022-05-01 – 2022-05-02 (×2): 100 mg via ORAL
  Filled 2022-05-01 (×2): qty 1

## 2022-05-01 MED ORDER — CARVEDILOL 6.25 MG PO TABS: 6.2500 mg | ORAL_TABLET | Freq: Two times a day (BID) | ORAL | 0 refills | Status: DC

## 2022-05-01 MED ORDER — CLOPIDOGREL BISULFATE 75 MG PO TABS: 75.0000 mg | ORAL_TABLET | Freq: Every day | ORAL | 0 refills | Status: AC

## 2022-05-01 MED ORDER — CALCITRIOL 0.25 MCG PO CAPS: 0.2500 ug | ORAL_CAPSULE | ORAL | 0 refills | Status: DC

## 2022-05-01 MED ORDER — ACETAMINOPHEN 325 MG PO TABS: 325.0000 mg | ORAL_TABLET | ORAL | Status: AC | PRN

## 2022-05-01 MED ORDER — INFLUENZA VAC A&B SA ADJ QUAD 0.5 ML IM PRSY
0.5000 mL | PREFILLED_SYRINGE | INTRAMUSCULAR | Status: DC
  Filled 2022-05-01: qty 0.5

## 2022-05-01 MED ORDER — ASPIRIN 81 MG PO TBEC: 81.0000 mg | DELAYED_RELEASE_TABLET | Freq: Every day | ORAL | 0 refills | Status: DC

## 2022-05-01 MED ORDER — AMLODIPINE BESYLATE 5 MG PO TABS: 5.0000 mg | ORAL_TABLET | Freq: Every day | ORAL | 0 refills | Status: DC

## 2022-05-01 NOTE — Progress Notes (Signed)
Inpatient Rehabilitation Care Coordinator Discharge Note   Patient Details  Name: STARLA DELLER MRN: [id number] Date of Birth: [birth year]   Discharge location: Home  Length of Stay: 10 Days  Discharge activity level: Supervision  Home/community participation: Son and DIL  Patient response QG:BEEFEO Literacy - How often do you need to have someone help you when you read instructions, pamphlets, or other written material from your doctor or pharmacy?: Often  Patient response FH:QRFXJO Isolation - How often do you feel lonely or isolated from those around you?: Never  Services provided included: SW, Pharmacy, TR, CM, RN, SLP, OT, PT, RD, MD  Financial Services:  Financial Services Utilized: [employer]  Choices offered to/list presented to: daughter  Follow-up services arranged:  [place]: Alvis Lemmings         Patient response to transportation need: Is the patient able to respond to transportation needs?: Yes In the past 12 months, has lack of transportation kept you from medical appointments or from getting medications?: No In the past 12 months, has lack of transportation kept you from meetings, work, or from getting things needed for daily living?: No    Comments (or additional information):  Patient/Family verbalized understanding of follow-up arrangements:  Yes  Individual responsible for coordination of the follow-up plan: son or SIL  Confirmed correct DME delivered: Dyanne Iha 05/01/2022    Dyanne Iha

## 2022-05-01 NOTE — Progress Notes (Signed)
Patient complaining of "sore knot" of right groin area. Almond-sized firm nodule with several puncta draining a tiny amount of serous fluid. Culture obtained. Warm packs ordered. Will start doxycyline  .

## 2022-05-01 NOTE — Progress Notes (Signed)
Occupational Therapy Session Note  Patient Details  Name: Gina Bailey MRN: [id number] Date of Birth: June 15, 1946  Today's Date: 05/01/2022 OT Individual Time: 0915-1020 OT Individual Time Calculation (min): 65 min    Short Term Goals: Week 1:  OT Short Term Goal 1 (Week 1): Pt will locate items on left side with min cues OT Short Term Goal 2 (Week 1): Pt will be able to comlete sit to stands with supervision OT Short Term Goal 3 (Week 1): Pt will be able to complete stand pivot transfer to toilet with CGA  Skilled Therapeutic Interventions/Progress Updates:    Pt received in w/c and ready for therapy. Family (son and daughter in Sports coach) present for family education.  Son stepped out when it was time for pt to shower as his wife will be assisting her with self care.   With explanation and demonstration, while pt engage in tasks in room, reviewed in detail safety concerns with decreased memory and safety awareness ( ie safe positioning before reaching forward to don socks). She continues to have L inattention and L visual field cut and needs cues to scan to her left.  Overall she is close S with all self care tasks but emphasized the need to be close to her (arms length or less) and on her L side in case she should lose her balance. Pt is very energetic and wants to be independent so she follows cues and directions well. Discussed the need for 24/7 supervision.  See ADL documentation below, pt completed toileting, shower, dressing.  Pt resting at EOB when PT arrived for next session.   Therapy Documentation Precautions:  Precautions Precautions: Fall Precaution Comments: impulsive, L inattention Restrictions Weight Bearing Restrictions: No    Vital Signs:  Pain:  Pain Assessment Pain Score: 0-No pain ADL: ADL Eating: Independent Grooming: Supervision/safety Where Assessed-Grooming: Standing at sink Upper Body Bathing: Supervision/safety Where Assessed-Upper Body Bathing:  Shower Lower Body Bathing: Supervision/safety Where Assessed-Lower Body Bathing: Shower Upper Body Dressing: Supervision/safety Where Assessed-Upper Body Dressing: Edge of bed Lower Body Dressing: Supervision/safety Where Assessed-Lower Body Dressing: Edge of bed Toileting: Supervision/safety Where Assessed-Toileting: Glass blower/designer: Close supervision Toilet Transfer Method: Counselling psychologist: Ambulance person Transfer: Close supervison Clinical cytogeneticist Method: Optometrist: Facilities manager: Close supervision Social research officer, government Method: Heritage manager: Radio broadcast assistant   Therapy/Group: Individual Therapy  [place] 05/01/2022, 12:54 PM

## 2022-05-01 NOTE — Progress Notes (Signed)
Speech Language Pathology Discharge Summary  Patient Details  Name: Gina Bailey MRN: [id number] Date of Birth: 1946/04/20  Date of Discharge from [place] 13, 2023  Today's Date: 05/01/2022 SLP Individual Time: 1130-1200 SLP Individual Time Calculation (min): 30 min  Skilled Therapeutic Interventions: Skilled ST treatment focused on education with pt's son and daughter-in-law. SLP facilitated education regarding pt's mild oral dysphagia, diet recommendations, and swallowing precautions and strategies. SLP thoroughly discussed dysphagia 3 textures, meal preparation, and foods/textures to avoid. SLP provided a dysphagia 3 diet handout for further reinforcement. Also educated on recommendation to provide at least intermittent supervision during meals to assess for pocketing, cueing for pt to perform lingual or (clean) finger sweep. Anticipate pt will be able to progress to regular textures at home with time as tolerated. Son and daughter-in-law verbalized understanding with teach back. Discussed pt's progress with intermittent verbal cues to reduce speech rate as needed. Pt is intelligible at the conversation level. All educated complete and questions addressed. Patient was left in bed with alarm activated and immediate needs within reach at end of session.   Patient has met 2 of 2 long term goals.  Patient to discharge at overall Supervision level.  Reasons goals not met: All goals met   Clinical Impression/Discharge Summary: Pt has demonstrated excellent progress towards swallowing and speech goals meeting 2 out of 2 long-term goals this admission. Pt is currently at mod I-to-sup A level for implementing speech intelligibility strategies as needed at the conversation level. Pt is able to communicate functional needs and engage in conversation with full intelligibility. Pt is currently consuming a dysphagia 3 diet with thin liquids at the supervision level to implement swallowing  compensatory strategies including lingual or (clean) finger sweep to clear occasional left buccal pocketing to optimize safety with consumption. Anticipate pt will be able to advance to regular solids as tolerated. Pt/family education completed. Care partner is independent to provide the necessary physical and cognitive assistance at discharge. Recommend  24 hour supervision at discharge due to baseline cognitive deficits likely impacting insight into deficits. Follow up services do not appear clinically indicated at this time.     Care Partner:  Caregiver Able to Provide Assistance: Yes  Type of Caregiver Assistance: Physical;Cognitive  Recommendation:  24 hour supervision/assistance     Equipment: None   Reasons for discharge: Treatment goals met;Discharged from hospital   Patient/Family Agrees with Progress Made and Goals Achieved: Yes    Derriona Branscom T Spence Soberano 05/01/2022, 4:15 PM

## 2022-05-01 NOTE — Progress Notes (Signed)
Physical Therapy Discharge Summary  Patient Details  Name: Gina Bailey MRN: [id number] Date of Birth: 17-Jan-1946  Date of Discharge from PT service:{Time; dates multiple:304500300}  {CHL IP REHAB PT TIME CALCULATION:304800500}   Patient has met {NUMBERS 0-12:18577} of {NUMBERS 0-12:18577} long term goals due to {due DD:2202542}.  Patient to discharge at [facility] level {LOA:3049010}.   Patient's care partner {care partner:3041650} to provide the necessary {assistance:3041652} assistance at discharge.  Reasons goals not met: ***  Recommendation:  Patient will benefit from ongoing skilled PT services in {setting:3041680} to continue to advance safe functional mobility, address ongoing impairments in ***, and minimize fall risk.  Equipment: {equipment:3041657}  Reasons for discharge: {Reason for discharge:3049018}  Patient/family agrees with progress made and goals achieved: {Pt/Family agree with progress/goals:3049020}  PT Discharge Precautions/Restrictions Precautions Precautions: None Precaution Comments: mild L inattention Restrictions Weight Bearing Restrictions: No Vital Signs Therapy Vitals Temp: 98.4 F (36.9 C) Temp Source: Oral Pulse Rate: 84 Resp: 17 BP: 137/61 Patient Position (if appropriate): Sitting Oxygen Therapy SpO2: (!) 55 % O2 Device: Room Air Pain Pain Assessment Pain Scale: 0-10 Pain Score: 0-No pain Pain Interference Pain Interference Pain Effect on Sleep: 1. Rarely or not at all Pain Interference with Therapy Activities: 1. Rarely or not at all Pain Interference with Day-to-Day Activities: 1. Rarely or not at all Vision/Perception  Vision - History Ability to See in Adequate Light: 1 Impaired Vision - Assessment Eye Alignment: Within Functional Limits Ocular Range of Motion: Within Functional Limits Alignment/Gaze Preference: Gaze right Tracking/Visual Pursuits: Decreased smoothness of horizontal tracking;Decreased smoothness of  vertical tracking;Requires cues, head turns, or add eye shifts to track Saccades: Additional eye shifts occurred during testing Convergence: Within functional limits Perception Perception: Within Functional Limits Inattention/Neglect: Other (comment);Does not attend to left visual field (Pt demonstrates improvement in attending to left visual field, Pt requires min verbal cues to implement visual scanning techniques during ADLs) Praxis Praxis: Intact  Cognition Overall Cognitive Status: Impaired/Different from baseline Arousal/Alertness: Awake/alert Attention: Selective Focused Attention: Appears intact Sustained Attention: Appears intact Sustained Attention Impairment: Verbal basic Selective Attention: Impaired Selective Attention Impairment: Functional basic Memory: Impaired Memory Impairment: Decreased short term memory;Decreased recall of new information Decreased Short Term Memory: Verbal basic Awareness: Appears intact Awareness Impairment: Intellectual impairment Problem Solving: Impaired Problem Solving Impairment: Functional basic Executive Function: Reasoning Reasoning: Impaired Reasoning Impairment: Verbal complex Sequencing: Appears intact Sequencing Impairment: Verbal complex Organizing: Appears intact Organizing Impairment: Verbal complex Decision Making: Appears intact Safety/Judgment: Impaired Comments: Pt demonstrates improvement in safety awareness, sequencing, and problem solving. Requires verbal cues for safety druing ADLs and ambulation. Sensation Sensation Light Touch: Appears Intact Hot/Cold: Appears Intact Proprioception: Appears Intact Stereognosis: Not tested Coordination Gross Motor Movements are Fluid and Coordinated: Yes Fine Motor Movements are Fluid and Coordinated: Yes Heel Shin Test: decreased ROM on BLE L>R Motor  Motor Motor: Within Functional Limits Motor - Discharge Observations: Improved strenght and coordination with LUE, Left hand  is still weaker than right, but within functional limits. Pt is consistently using LUE in functional activities.  Mobility Bed Mobility Bed Mobility: Rolling Right;Rolling Left;Sit to Supine;Supine to Sit Rolling Right: Independent with assistive device Rolling Left: Independent with assistive device Right Sidelying to Sit: Independent with assistive device Supine to Sit: Independent with assistive device Sit to Supine: Independent with assistive device Transfers Transfers: Sit to Stand;Stand Pivot Transfers Sit to Stand: Independent with assistive device Stand Pivot Transfers: Supervision/Verbal cueing;Set up assist Transfer (Assistive device): Rolling walker Locomotion  Gait Ambulation:  Yes Gait Assistance: Supervision/Verbal cueing Gait Distance (Feet): 200 Feet Assistive device: Rolling walker Gait Gait Pattern: Impaired Gait Pattern: Lateral hip instability;Decreased weight shift to right;Narrow base of support;Trunk flexed Gait velocity: improved from baseline Stairs / Additional Locomotion Stairs: Yes Stairs Assistance: Supervision/Verbal cueing Stair Management Technique: Two rails Number of Stairs: 12 Height of Stairs: 6 Wheelchair Mobility Wheelchair Assistance: Chartered loss adjuster: Both upper extremities Wheelchair Parts Management: Needs assistance Distance: 150  Trunk/Postural Assessment  Cervical Assessment Cervical Assessment: Exceptions to [facility] (Forward head) Cervical AROM Overall Cervical AROM: Within functional limits for tasks performed Thoracic Assessment Thoracic Assessment: Within Functional Limits Lumbar Assessment Lumbar Assessment: Within Functional Limits Postural Control Postural Control: Within Functional Limits Trunk Control: Pt. occasionally will lean towards left during ADLs when experiencing fatigue, pt. can correct with verbal cue Righting Reactions: delayed  Balance Balance Balance Assessed: Yes Berg  Balance Test Sit to Stand: Able to stand  independently using hands Standing Unsupported: Able to stand 2 minutes with supervision Sitting with Back Unsupported but Feet Supported on Floor or Stool: Able to sit safely and securely 2 minutes Stand to Sit: Controls descent by using hands Transfers: Able to transfer safely, definite need of hands Standing Unsupported with Eyes Closed: Able to stand 10 seconds with supervision Standing Ubsupported with Feet Together: Able to place feet together independently and stand 1 minute safely From Standing, Reach Forward with Outstretched Arm: Can reach forward >5 cm safely (2") From Standing Position, Pick up Object from Floor: Able to pick up shoe, needs supervision From Standing Position, Turn to Look Behind Over each Shoulder: Looks behind one side only/other side shows less weight shift Turn 360 Degrees: Needs close supervision or verbal cueing Standing Unsupported, Alternately Place Feet on Step/Stool: Able to complete >2 steps/needs minimal assist Standing Unsupported, One Foot in Front: Able to take small step independently and hold 30 seconds Standing on One Leg: Tries to lift leg/unable to hold 3 seconds but remains standing independently Total Score: 36 Timed Up and Go Test Normal TUG (seconds): 20 (with no AD, but min assist from PT 26 sec with RW, but no LOB) Static Sitting Balance Static Sitting - Balance Support: Feet supported Static Sitting - Level of Assistance: 7: Independent Dynamic Sitting Balance Dynamic Sitting - Balance Support: Feet supported Dynamic Sitting - Level of Assistance: 7: Independent Sitting balance - Comments: Occasionally leans to L during ADLs (toielting & showering). Pt can correct with a verbal cue. Static Standing Balance Static Standing - Balance Support: Bilateral upper extremity supported Static Standing - Level of Assistance: 6: Modified independent (Device/Increase time) Dynamic Standing Balance Dynamic  Standing - Balance Support: During functional activity Dynamic Standing - Level of Assistance: 5: Stand by assistance Extremity Assessment  RUE Assessment RUE Assessment: Within Functional Limits LUE Assessment LUE Assessment: Within Functional Limits RLE Assessment RLE Assessment: Within Functional Limits LLE Assessment LLE Assessment: Exceptions to [facility] General Strength Comments: hip and ankle grossly 4/5 in all direction. knee grossly 4+/5 to 5/5   Lorie Phenix 05/01/2022, 3:47 PM

## 2022-05-01 NOTE — Progress Notes (Signed)
Occupational Therapy Daily Note and  Discharge Summary  Patient Details  Name: SHEMEIKA STARZYK MRN: [id number] Date of Birth: 1946/01/22    Date of Discharge from [facility] 13, 2023  Today's Date: 05/01/2022 OT Individual Time: 0100-0200 OT Individual Time Calculation (min): 60 min   Skilled OT Treatment: Pt received in room with son and DIL present. Pt using the bathroom with CNA upon OT arrival. No pain reported at the beginning of the session and Pt motivated to participate in therapy. Pt completed 3/3 toileting tasks with supervision and verbal cues for safety when leaning to complete peri care. Pt ambulated to the sink to wash her hands with supervision. Pt transported to hall bathroom with tub/shower to complete shower on tub bench. Pt's DIL present during shower to receive additional education and hands on practice working with Pt. Pt transported to tub bench with supervision and min verbal cueing. Pt was able to complete U/LB bathing with supervision. Pt completed U/LB dressing while sitting on tub bench with supervision. Pt donned footwear by crossing leg in figure four position with supervision. Pt transported back to room in wc. DIL reported she felt comfortable and confident in her skills to assist the Pt upon discharge home and demonstrated good teach back for transfers and verbal cues. Pt was left in her room with seat belt alarm on, call bell in reach, all needs met and DIL present.   Patient has met 14 of 14 long term goals due to improved activity tolerance, improved balance, postural control, ability to compensate for deficits, functional use of  LEFT upper and LEFT lower extremity, improved attention, improved awareness, and improved coordination. Pt is currently ambulating with a RW with supervision requiring occasionally verbal cues for problem solving and safety awareness. Pt is showering within a tub/shower using a tub bench with a back with supervision. Pt is able to  complete 3/3 toileting tasks with close supervision and occasional verbal cues for safety when completing peri care. Pt caompletes U/LB dressing while sitting in a wheel chair, crossing her legs in a figure four position to don pants/underwear/socks/shoes, and standing with RW to pull pants over her waist.   Patient to discharge at overall Supervision level.  Patient's care partner is independent to provide the necessary physical and cognitive assistance at discharge.     Recommendation:  Patient will benefit from ongoing skilled OT services in home health setting to continue to advance functional skills in the area of BADL and Reduce care partner burden.  Equipment: Tub bench with back  Reasons for discharge: treatment goals met and discharge from hospital  Patient/family agrees with progress made and goals achieved: Yes  OT Discharge  Pain Pain Assessment Pain Scale: 0-10 Pain Score: 0-No pain ADL ADL Eating: Independent Where Assessed-Eating: Edge of bed Grooming: Supervision/safety Where Assessed-Grooming: Standing at sink Upper Body Bathing: Supervision/safety Where Assessed-Upper Body Bathing: Shower Lower Body Bathing: Supervision/safety Where Assessed-Lower Body Bathing: Shower Upper Body Dressing: Supervision/safety Where Assessed-Upper Body Dressing: Other (Comment) (Sitting on tub bench) Lower Body Dressing: Supervision/safety Where Assessed-Lower Body Dressing: Other (Comment) (sitting on tub bench) Toileting: Supervision/safety Where Assessed-Toileting: Glass blower/designer: Close supervision (Occasionally impulsive when needing to urgently use the bathroom) Toilet Transfer Method: Counselling psychologist: Grab bars Tub/Shower Transfer: Close supervison Clinical cytogeneticist Method: Optometrist: Facilities manager: Close supervision Social research officer, government Method: Heritage manager:  Gaffer Baseline Vision/History: 1 Wears glasses Patient Visual Report:  No change from baseline Vision Assessment?: Yes Eye Alignment: Within Functional Limits Ocular Range of Motion: Within Functional Limits Alignment/Gaze Preference: Gaze right Tracking/Visual Pursuits: Decreased smoothness of horizontal tracking;Decreased smoothness of vertical tracking;Requires cues, head turns, or add eye shifts to track Saccades: Additional eye shifts occurred during testing Convergence: Within functional limits Visual Fields: No apparent deficits Perception  Perception: Within Functional Limits Inattention/Neglect: Other (comment);Does not attend to left visual field (Pt demonstrates improvement in attending to left visual field, Pt requires min verbal cues to implement visual scanning techniques during ADLs) Praxis Praxis: Intact Cognition Cognition Overall Cognitive Status: Impaired/Different from baseline Arousal/Alertness: Awake/alert Orientation Level: Person;Place;Situation Person: Oriented Place: Oriented Situation: Oriented Memory: Impaired Memory Impairment: Decreased short term memory;Decreased recall of new information Decreased Short Term Memory: Verbal basic Attention: Selective Focused Attention: Appears intact Sustained Attention: Appears intact Sustained Attention Impairment: Verbal basic Selective Attention: Impaired Selective Attention Impairment: Functional basic Awareness: Appears intact Awareness Impairment: Intellectual impairment Problem Solving: Impaired Problem Solving Impairment: Functional basic Executive Function: Reasoning Reasoning: Impaired Reasoning Impairment: Verbal complex Sequencing: Appears intact Sequencing Impairment: Verbal complex Organizing: Appears intact Organizing Impairment: Verbal complex Decision Making: Appears intact Safety/Judgment: Impaired Comments: Pt demonstrates improvement in safety awareness, sequencing,  and problem solving. Requires verbal cues for safety druing ADLs and ambulation. Brief Interview for Mental Status (BIMS) Repetition of Three Words (First Attempt): 3 Temporal Orientation: Year: Correct Temporal Orientation: Month: Accurate within 5 days Temporal Orientation: Day: Incorrect Recall: "Sock": No, could not recall Recall: "Blue": Yes, after cueing ("a color") Recall: "Bed": No, could not recall BIMS Summary Score: 9 Sensation Sensation Light Touch: Appears Intact Hot/Cold: Appears Intact Proprioception: Appears Intact Stereognosis: Not tested Coordination Gross Motor Movements are Fluid and Coordinated: Yes Fine Motor Movements are Fluid and Coordinated: Yes Motor  Motor Motor: Within Functional Limits Motor - Discharge Observations: Improved strenght and coordination with LUE, Left hand is still weaker than right, but within functional limits. Pt is consistently using LUE in functional activities. Mobility  Bed Mobility Bed Mobility: Rolling Right;Rolling Left Rolling Right: Independent Rolling Left: Minimal Assistance - Patient > 75%;Independent Right Sidelying to Sit: Supervision/Verbal cueing Supine to Sit: Supervision/Verbal cueing Sit to Supine: Supervision/Verbal cueing Transfers Sit to Stand: Supervision/Verbal cueing  Trunk/Postural Assessment  Cervical Assessment Cervical Assessment: Exceptions to [facility] (Forward head) Cervical AROM Overall Cervical AROM: Within functional limits for tasks performed Thoracic Assessment Thoracic Assessment: Within Functional Limits Lumbar Assessment Lumbar Assessment: Within Functional Limits Postural Control Postural Control: Within Functional Limits Trunk Control: Pt. occasionally will lean towards left during ADLs when experiencing fatigue, pt. can correct with verbal cue Righting Reactions: delayed  Balance Balance Balance Assessed: Yes Static Sitting Balance Static Sitting - Balance Support: Feet  supported Static Sitting - Level of Assistance: 7: Independent Dynamic Sitting Balance Dynamic Sitting - Balance Support: Feet supported Dynamic Sitting - Level of Assistance: 7: Independent Sitting balance - Comments: Occasionally leans to L during ADLs (toielting & showering). Pt can correct with a verbal cue. Static Standing Balance Static Standing - Balance Support: Bilateral upper extremity supported Static Standing - Level of Assistance: 6: Modified independent (Device/Increase time) Dynamic Standing Balance Dynamic Standing - Balance Support: During functional activity Dynamic Standing - Level of Assistance: 5: Stand by assistance Extremity/Trunk Assessment RUE Assessment RUE Assessment: Within Functional Limits LUE Assessment LUE Assessment: Within Functional Limits   Janey Genta, OTR/L  05/01/2022, 3:26 PM

## 2022-05-01 NOTE — Progress Notes (Signed)
Patient ID: Gina Bailey, female   DOB: 11-26-1945, 76 y.o.   MRN: [id number]  SW spoke with patient DIL and informed her that patient discharge was not extended by physician. PA has reviewed discharge instructions with DIL. Sw provided DIL in with information for discharge and PACE program. DIL will discuss with patient son the plan for discharge tomorrow.

## 2022-05-01 NOTE — Progress Notes (Signed)
PROGRESS NOTE   Subjective/Complaints:  Had a BM , pt eating breakfast, good appetite , no c/os  Review of Systems  Constitutional:  Negative for chills and fever.  HENT:  Negative for congestion.   Eyes:  Negative for double vision.  Respiratory:  Negative for cough.   Cardiovascular:  Negative for chest pain and palpitations.  Gastrointestinal:  Negative for abdominal pain, diarrhea, heartburn, nausea and vomiting.  Genitourinary: Negative.   Musculoskeletal: Negative.   Neurological:  Positive for weakness. Negative for dizziness.     Objective:   No results found. No results for input(s): "WBC", "HGB", "HCT", "PLT" in the last 72 hours.  No results for input(s): "NA", "K", "CL", "CO2", "GLUCOSE", "BUN", "CREATININE", "CALCIUM" in the last 72 hours.   Intake/Output Summary (Last 24 hours) at 05/01/2022 0749 Last data filed at 04/30/2022 1821 Gross per 24 hour  Intake 598 ml  Output --  Net 598 ml         Physical Exam: Vital Signs Blood pressure (!) 150/68, pulse 71, temperature 98.2 F (36.8 C), temperature source Oral, resp. rate 18, height '5\' 6"'$  (1.676 m), weight 77 kg, last menstrual period 10/22/1978, SpO2 100 %.   General: No acute distress Mood and affect are appropriate Heart: Regular rate and rhythm no rubs murmurs or extra sounds, Zio patch left chest  Lungs: Clear to auscultation, breathing unlabored, no rales or wheezes Abdomen: Positive bowel sounds, soft nontender to palpation, nondistended Extremities: No clubbing, cyanosis, or edema Skin: No evidence of breakdown, no evidence of rash   Neuro:  Alert to person and place decreased memory, follows simple commands, CN 2-12 intact orther than L facial weakness,mild  L inattention, she has dysarthria- no aphasia  Sensation intact to LT in all 4 extremities Strength 5/5 in b/l UE and RLE, strength  4-/5 in proximal LLE and 4/5 in distal LLE   FTN intact b/l   Assessment/Plan: 1. Functional deficits which require 3+ hours per day of interdisciplinary therapy in a comprehensive inpatient rehab setting. Physiatrist is providing close team supervision and 24 hour management of active medical problems listed below. Physiatrist and rehab team continue to assess barriers to discharge/monitor patient progress toward functional and medical goals  Care Tool:  Bathing    Body parts bathed by patient: Right arm, Left arm, Abdomen, Chest, Front perineal area, Buttocks, Left upper leg, Right upper leg, Right lower leg, Face, Left lower leg   Body parts bathed by helper: Left lower leg     Bathing assist Assist Level: Contact Guard/Touching assist     Upper Body Dressing/Undressing Upper body dressing   What is the patient wearing?: Pull over shirt    Upper body assist Assist Level: Contact Guard/Touching assist    Lower Body Dressing/Undressing Lower body dressing      What is the patient wearing?: Underwear/pull up, Pants     Lower body assist Assist for lower body dressing: Minimal Assistance - Patient > 75%     Toileting Toileting    Toileting assist Assist for toileting: Minimal Assistance - Patient > 75%     Transfers Chair/bed transfer  Transfers assist     Chair/bed  transfer assist level: Minimal Assistance - Patient > 75%     Locomotion Ambulation   Ambulation assist      Assist level: Moderate Assistance - Patient 50 - 74% Assistive device: Walker-rolling Max distance: 90   Walk 10 feet activity   Assist     Assist level: Moderate Assistance - Patient - 50 - 74% Assistive device: Walker-rolling   Walk 50 feet activity   Assist    Assist level: Moderate Assistance - Patient - 50 - 74% Assistive device: Walker-rolling    Walk 150 feet activity   Assist Walk 150 feet activity did not occur: Safety/medical concerns         Walk 10 feet on uneven surface   activity   Assist Walk 10 feet on uneven surfaces activity did not occur: Safety/medical concerns         Wheelchair     Assist Is the patient using a wheelchair?: Yes Type of Wheelchair: Manual    Wheelchair assist level: Minimal Assistance - Patient > 75% Max wheelchair distance: 90    Wheelchair 50 feet with 2 turns activity    Assist        Assist Level: Minimal Assistance - Patient > 75%   Wheelchair 150 feet activity     Assist      Assist Level: Moderate Assistance - Patient 50 - 74%   Blood pressure (!) 150/68, pulse 71, temperature 98.2 F (36.8 C), temperature source Oral, resp. rate 18, height '5\' 6"'$  (1.676 m), weight 77 kg, last menstrual period 10/22/1978, SpO2 100 %.  Medical Problem List and Plan: 1. 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  Antithrombotics: -DVT/anticoagulation:  Pharmaceutical: Heparin- will d/c amb >1500' in PT             -antiplatelet therapy: aspirin 81 mg and Plavix for 3 weeks (until 05/07/22) then aspirin alone 3. Pain Management: Tylenol as needed 4. Mood/Behavior/Sleep: LCSW to evaluate and provide emotional support             -antipsychotic agents: none 5. Neuropsych/cognition: This patient does capable of making decisions on her own behalf. 6. Skin/Wound Care: Routine skin care checks 7. 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 Glucose stable on last BMP 10: Chronic BLE edema: minimal currently -continue Lasix 80 mg q AM (40 mg q PM discontinued)             -continue compression stockings when OOB  11: Hypertension: monitor             -continue amlodipine 10 mg daily- reduce to '5mg'$  daily starting today             -continue carvedilol 12.5 mg BID- reduce to 6.'25mg'$  BID starting today              -continue Lasix   Vitals:   04/30/22 1854 05/01/22 0525  BP: 129/63 (!) 150/68  Pulse: 71 71  Resp: 16 18  Temp: 98.3 F (36.8 C) 98.2 F (36.8 C)  SpO2:  433%   Goal of systolic 295-188 based on symptomatic hypotension when Sys BP was ~124mHg 12: Diabetic neuropathy both feet: follows with podiatry 13: Hyperlipidemia: Crestor 20 mg daily 14: Anemia of chronic renal failure: received 4 transfusion of IV Ferrlecit; follow-up CBC  -10/9 HGB stable at 8.9    Latest Ref Rng & Units 04/27/2022    6:12 AM 04/23/2022    5:12 AM 04/22/2022    3:32 PM  CBC  WBC 4.0 - 10.5 K/uL 9.9  8.6  7.7   Hemoglobin 12.0 - 15.0 g/dL 8.6  8.1  8.0   Hematocrit 36.0 - 46.0 % 27.1  25.8  25.9   Platelets 150 - 400 K/uL 344  299  313     15: Gout flare: prednisone for 3 days 16: Obesity: dietary counseling 17: Bilateral carotid artery stenosis: outpatient surveillance 18. Dysphagia             -SLP eval, dys 3 diet with thin liquids  Eating well  19. Hypermagnesemia  -Mild asymptomatic,  avoid Mg supplements, recheck persistent mild elevation  20.  R/o cardiac arrythmia- cont Zio patch, f/u with cardiology  LOS: 9 days [place] 05/01/2022, 7:49 AM

## 2022-05-01 NOTE — Progress Notes (Signed)
Patient ID: Gina Bailey, female   DOB: 04-Sep-1945, 76 y.o.   MRN: [id number]  Transport chair ordered through [facility] per daughters request. Chair scheduled to be delivered to patients home next week.

## 2022-05-01 NOTE — Plan of Care (Signed)
  Problem: RH Swallowing Goal: LTG Patient will consume least restrictive diet using compensatory strategies with assistance (SLP) Description: LTG:  Patient will consume least restrictive diet using compensatory strategies with assistance (SLP) Outcome: Completed/Met   Problem: RH Expression Communication Goal: LTG Patient will increase speech intelligibility (SLP) Description: LTG: Patient will increase speech intelligibility at word/phrase/conversation level with cues, % of the time (SLP) Outcome: Completed/Met

## 2022-05-01 NOTE — Progress Notes (Signed)
Physical Therapy Session Note  Patient Details  Name: Gina Bailey MRN: [id number] Date of Birth: 04/21/1946  Today's Date: 05/01/2022 PT Individual Time: 1020-1100 and 1500-1545 PT Individual Time Calculation (min): 40 min and 45 min   Short Term Goals: Week 1:  PT Short Term Goal 1 (Week 1): Pt will transfer sup <> sit w/ CGA PT Short Term Goal 2 (Week 1): Pt will transfer sit to stand w/ min A PT Short Term Goal 3 (Week 1): Pt willamb 100' w/ RW and min A PT Short Term Goal 4 (Week 1): Pt will assess curb height step w/ AD Week 2:     Skilled Therapeutic Interventions/Progress Updates:   Session 1 Pt received sitting in EOB and agreeable to PT. Son and daughter-in-law present throughout session. Gait training with RW 2 x 136f with supervision assist. Step management training to simulate access to home environment with RW up/down. [facility] and all needs met.     Session 2. Pt received sitting in WC and agreeable to PT. [facility]   Patient demonstrates increased fall risk as noted by score of   36/56 on Berg Balance Scale.  (<36= high risk for falls, close to 100%; 37-45 significant >80%; 46-51 moderate >50%; 52-55 lower >25%)  PT instructed pt in TUG: 24 sec (average of 3 trials; >13.5 sec indicates increased fall risk)   WC mobility with BUE propulsion x 1523fand supervision assist. Sit<>stand with supervision assist throughout session.   PT instructed pt in Grad day assessment to measure progress toward goals. See below for details. CARETool mobility assessment  also completed; see CAREtool tab in navigator for details.      Therapy Documentation Precautions:  Precautions Precautions: Fall Precaution Comments: impulsive, L inattention Restrictions Weight Bearing Restrictions: No   Pain: Pain Assessment Pain Score: 0-No pain    Therapy/Group: Individual Therapy  AuLorie Phenix0/13/2023, 1:34 PM

## 2022-05-01 NOTE — Progress Notes (Signed)
RN reporting urinary frequency. No pain or dysuria. Will check PVRs and UA.

## 2022-05-01 NOTE — Progress Notes (Signed)
Physical Therapy Session Note  Patient Details  Name: Gina Bailey MRN: [id number] Date of Birth: Apr 14, 1946  Today's Date: 04/30/2022 PT Individual Time: 1120-1200   17 MIN   Short Term Goals: Week 1:  PT Short Term Goal 1 (Week 1): Pt will transfer sup <> sit w/ CGA PT Short Term Goal 2 (Week 1): Pt will transfer sit to stand w/ min A PT Short Term Goal 3 (Week 1): Pt willamb 100' w/ RW and min A PT Short Term Goal 4 (Week 1): Pt will assess curb height step w/ AD Week 2:     Skilled Therapeutic Interventions/Progress Updates:   Pt received sitting in WC and agreeable to PT. Son present for PT session. Gait training through hall with RW and supervision [email or web address] for son to pt's L inattention and need to be awaren in community setting for safety. Step management training to simulate access to house with RW performed x 3 with cues for AD management. Bed mobility training to ~31in to simulate access to bed with supervision assist and cues for positioning of hips on bed to reduce fall risk. Car transfer training with RW and supervision assist with cues for sit>pivot to transfer into car and attention to the LLE to turning to car. Patient returned to room and left sitting in [facility] with call bell in reach and all needs met.         Therapy Documentation Precautions:  Precautions Precautions: Fall Precaution Comments: impulsive, L inattention Restrictions Weight Bearing Restrictions: No  Vital Signs: Therapy Vitals Temp: 98.2 F (36.8 C) Temp Source: Oral Pulse Rate: 71 Resp: 18 BP: (!) 150/68 Patient Position (if appropriate): Lying Oxygen Therapy SpO2: 100 % O2 Device: Room Air Pain: denies    Therapy/Group: Individual Therapy  Lorie Phenix 05/01/2022, 5:43 AM

## 2022-05-01 NOTE — Progress Notes (Signed)
Inpatient Rehabilitation Discharge Medication Review by a Pharmacist  A complete drug regimen review was completed for this patient to identify any potential clinically significant medication issues.  High Risk Drug Classes Is patient taking? Indication by Medication  Antipsychotic No   Anticoagulant No   Antibiotic No   Opioid No   Antiplatelet Yes Aspirin and clopidogrel for CVA  Hypoglycemics/insulin No   Vasoactive Medication Yes Amlodipine and carvedilol for HTN   Chemotherapy No   Other Yes Calcitriol for hyperparathyroidism Furosemide for edema Rosuvastatin for HLD Senna-doc for constipation      Type of Medication Issue Identified Description of Issue Recommendation(s)  Drug Interaction(s) (clinically significant)     Duplicate Therapy     Allergy     No Medication Administration End Date     Incorrect Dose     Additional Drug Therapy Needed     Significant med changes from prior encounter (inform family/care partners about these prior to discharge). NEW aspirin, clopidogrel (to 10/29), senna-doc, rosuvastatin Decreased amlodipine dose  Team to educate patient   Other  Per 10/10 neurology note, aspirin and clopidogrel can be discontinued after 10/29 unless patient has other indication for aspirin. No hx of CAD in chart but high 10 year ASCVD risk.  Follow up with PCP about whether to continue aspirin for ASCVD risk after 10/29- discussed with PA     Clinically significant medication issues were identified that warrant physician communication and completion of prescribed/recommended actions by midnight of the next day:  No  Name of provider notified for urgent issues identified:   Provider Method of Notification:     Pharmacist comments:   Time spent performing this drug regimen review (minutes):  [place], PharmD, BCPS, [facility] Clinical Pharmacist  Please check AMION for all [place] phone numbers After 10:00 PM, call [place]

## 2022-05-01 NOTE — Progress Notes (Signed)
Daughter called RN in room noted red area on patients right groin area; warm to touch and painful to touch. Katharine Look PA notified.

## 2022-05-02 NOTE — Plan of Care (Signed)
  Problem: RH Balance Goal: LTG Patient will maintain dynamic sitting balance (PT) Description: LTG:  Patient will maintain dynamic sitting balance with assistance during mobility activities (PT) Outcome: Completed/Met Goal: LTG Patient will maintain dynamic standing balance (PT) Description: LTG:  Patient will maintain dynamic standing balance with assistance during mobility activities (PT) Outcome: Completed/Met   Problem: Sit to Stand Goal: LTG:  Patient will perform sit to stand with assistance level (PT) Description: LTG:  Patient will perform sit to stand with assistance level (PT) Outcome: Completed/Met   Problem: RH Bed Mobility Goal: LTG Patient will perform bed mobility with assist (PT) Description: LTG: Patient will perform bed mobility with assistance, with/without cues (PT). Outcome: Completed/Met   Problem: RH Bed to Chair Transfers Goal: LTG Patient will perform bed/chair transfers w/assist (PT) Description: LTG: Patient will perform bed to chair transfers with assistance (PT). Outcome: Completed/Met   Problem: RH Car Transfers Goal: LTG Patient will perform car transfers with assist (PT) Description: LTG: Patient will perform car transfers with assistance (PT). Outcome: Completed/Met   Problem: RH Ambulation Goal: LTG Patient will ambulate in controlled environment (PT) Description: LTG: Patient will ambulate in a controlled environment, # of feet with assistance (PT). Outcome: Completed/Met Goal: LTG Patient will ambulate in home environment (PT) Description: LTG: Patient will ambulate in home environment, # of feet with assistance (PT). Outcome: Completed/Met   Problem: RH Wheelchair Mobility Goal: LTG Patient will propel w/c in controlled environment (PT) Description: LTG: Patient will propel wheelchair in controlled environment, # of feet with assist (PT) Outcome: Completed/Met Goal: LTG Patient will propel w/c in home environment (PT) Description: LTG:  Patient will propel wheelchair in home environment, # of feet with assistance (PT). Outcome: Completed/Met   Problem: RH Stairs Goal: LTG Patient will ambulate up and down stairs w/assist (PT) Description: LTG: Patient will ambulate up and down # of stairs with assistance (PT) Outcome: Completed/Met   

## 2022-05-03 ENCOUNTER — Encounter: Payer: Self-pay | Admitting: Physical Medicine and Rehabilitation

## 2022-05-03 LAB — AEROBIC CULTURE W GRAM STAIN (SUPERFICIAL SPECIMEN)

## 2022-05-03 NOTE — Progress Notes (Signed)
Patient's culture returned positive for proteus mirabilis, susceptibilities are pending. I have discussed with pharmacist Sandford Craze, we will follow susceptibilities to assess if doxycycline is appropriate

## 2022-05-04 ENCOUNTER — Telehealth: Payer: Self-pay

## 2022-05-04 ENCOUNTER — Other Ambulatory Visit: Payer: Self-pay | Admitting: Physician Assistant

## 2022-05-04 DIAGNOSIS — I10 Essential (primary) hypertension: Secondary | ICD-10-CM

## 2022-05-04 NOTE — Patient Outreach (Signed)
  Care Coordination   Follow Up Visit Note   05/04/2022 Name: JAZZMAN LOUGHMILLER MRN: [id number] DOB: March 24, 1946  NAHOMI HEGNER is a 76 y.o. year old female who sees Jimmye Norman, Elaina Pattee, MD for primary care. I  spoke with [name], patients son.  What matters to the patients health and wellness today?  Patient was recently dc from [place] and she discharged without a gait belt and elastic exercise bands.  Collaborated with Howell Rucks, RN ant [facility] and was told the clinic does not have gate belts.  Attempted to reach acute rehab and was unable to get someone on the telephone to address.  Researched gait belts and they can be purchased at [facility] for about $10.00.  Notified [name] and also told him he could reach out to Dr. Letta Pate office and see if they can be of assistance.    Goals Addressed   None     SDOH assessments and interventions completed:  Yes     Care Coordination Interventions Activated:  Yes  Care Coordination Interventions:  Yes, provided   Follow up plan: No further intervention required.   Encounter Outcome:  Pt. Visit Completed

## 2022-05-04 NOTE — Patient Outreach (Signed)
  Care Coordination [facility] Note Transition Care Management Follow-up Telephone Call Date of discharge and from where: [facility] 04/22/22-05/01/22 How have you been since you were released from the hospital? Per patients son, [name], patient is doing well.  She is tired, but doing okay. Any questions or concerns? Yes- Son notes there was a purple bag at which had a gait belt and exercise bands in it that the patient was suppose to take home and did not.  He inquired how to get those items.  This RNCM attempted to reach out to acute rehab therapy and was unable to reach anyone on the phone.  Message sent to [facility] nurse triage team to see if the clinic could assist patient getting these items.  Items Reviewed: Did the pt receive and understand the discharge instructions provided? Yes  Medications obtained and verified? Yes  Other? No  Any new allergies since your discharge? No  Dietary orders reviewed? Yes Do you have support at home? Yes   Home Care and Equipment/Supplies: Were home health services ordered? no If so, what is the name of the agency? N/A  Has the agency set up a time to come to the patient's home? not applicable Were any new equipment or medical supplies ordered?  Yes: Transfer chair What is the name of the medical supply agency? [employer] Were you able to get the supplies/equipment? no Do you have any questions related to the use of the equipment or supplies? No  Functional Questionnaire: (I = Independent and D = Dependent) ADLs: Needs assistance  Bathing/Dressing- needs assistance  Meal Prep- D  Eating- I  Maintaining continence- I  Transferring/Ambulation- Needs support  Managing Meds- D  Follow up appointments reviewed:  PCP Hospital f/u appt confirmed? Yes  Scheduled to see Dr. Jimmye Norman on May 22, 2022 @ 10:15. [facility] f/u appt confirmed? Yes  Scheduled to see Dr. Read Drivers on May 12, 2022. Are transportation arrangements needed? No  If their condition  worsens, is the pt aware to call PCP or go to the Emergency Dept.? Yes Was the patient provided with contact information for the PCP's office or ED? Yes Was to pt encouraged to call back with questions or concerns? Yes  SDOH assessments and interventions completed:   Yes  Care Coordination Interventions Activated:  Yes   Care Coordination Interventions:  Messaged [facility] nurse pool about gait belt and exercise bands    Encounter Outcome:  Pt. Visit Completed

## 2022-05-05 ENCOUNTER — Ambulatory Visit: Payer: Self-pay

## 2022-05-05 NOTE — Patient Outreach (Signed)
  Care Coordination   Patient care consultation  Visit Note   05/05/2022 Name: Gina Bailey MRN: [id number] DOB: July 29, 1945  Gina Bailey is a 76 y.o. year old female who sees Jimmye Norman, Elaina Pattee, MD for primary care. I  Collaborated with Mamie Hague from [facility] who was charged with setting up home health and patient is not following up with outpatient after several week stay in acute rehab.  What matters to the patients health and wellness today?  Post acute rehab needs    Goals Addressed             This Visit's Progress    COMPLETED: Patient having PT/OT       Care Coordination Interventions: Collaborated with Mamie Hague at [facility] regarding the need for setting patient up with home health services.  Patient was recently discharged from  acute inpatient rehab and will be following up for outpatient rehab.  Mamie was charged to set up home health prior to the patient being admitted into the hospital and no longer needs services at this time.         SDOH assessments and interventions completed:  Yes  SDOH Interventions Today    Flowsheet Row Most Recent Value  SDOH Interventions   Housing Interventions Intervention Not Indicated  Transportation Interventions Intervention Not Indicated        Care Coordination Interventions Activated:  Yes  Care Coordination Interventions:  Yes, provided   Follow up plan: No further intervention required.   Encounter Outcome:  Pt. Visit Completed

## 2022-05-11 DIAGNOSIS — H43812 Vitreous degeneration, left eye: Secondary | ICD-10-CM | POA: Diagnosis not present

## 2022-05-11 DIAGNOSIS — H1045 Other chronic allergic conjunctivitis: Secondary | ICD-10-CM | POA: Diagnosis not present

## 2022-05-11 DIAGNOSIS — I4891 Unspecified atrial fibrillation: Secondary | ICD-10-CM | POA: Diagnosis not present

## 2022-05-11 DIAGNOSIS — I679 Cerebrovascular disease, unspecified: Secondary | ICD-10-CM | POA: Diagnosis not present

## 2022-05-11 DIAGNOSIS — H04123 Dry eye syndrome of bilateral lacrimal glands: Secondary | ICD-10-CM | POA: Diagnosis not present

## 2022-05-11 DIAGNOSIS — N185 Chronic kidney disease, stage 5: Secondary | ICD-10-CM | POA: Diagnosis not present

## 2022-05-11 DIAGNOSIS — Z961 Presence of intraocular lens: Secondary | ICD-10-CM | POA: Diagnosis not present

## 2022-05-11 DIAGNOSIS — E119 Type 2 diabetes mellitus without complications: Secondary | ICD-10-CM | POA: Diagnosis not present

## 2022-05-11 LAB — HM DIABETES EYE EXAM

## 2022-05-12 ENCOUNTER — Encounter: Payer: Medicare PPO | Attending: Registered Nurse | Admitting: Registered Nurse

## 2022-05-12 VITALS — BP 156/75 | HR 73 | Ht 66.0 in | Wt 163.4 lb

## 2022-05-12 DIAGNOSIS — E7849 Other hyperlipidemia: Secondary | ICD-10-CM | POA: Insufficient documentation

## 2022-05-12 DIAGNOSIS — I639 Cerebral infarction, unspecified: Secondary | ICD-10-CM | POA: Insufficient documentation

## 2022-05-12 DIAGNOSIS — I10 Essential (primary) hypertension: Secondary | ICD-10-CM | POA: Insufficient documentation

## 2022-05-12 NOTE — Progress Notes (Unsigned)
Subjective:    Patient ID: Gina Bailey, female    DOB: 07-06-1946, 76 Gina.o.   MRN: [id number]  HPI: Gina Bailey is a 76 Gina.o. female who returns for follow up appointment for chronic pain and medication refill. states *** pain is located in  ***. rates pain ***. current exercise regime is walking and performing stretching exercises.    Pain Inventory Average Pain 0 Pain Right Now 0 My pain is  no pain  LOCATION OF PAIN  no pain  BOWEL Number of stools per week: 4 Oral laxative use Yes  Type of laxative warm prune and apple juice Enema or suppository use No  History of colostomy No  Incontinent No   BLADDER Pads In and out cath, frequency . Able to self cath  . Bladder incontinence Yes  Frequent urination No  Leakage with coughing Yes  Difficulty starting stream No  Incomplete bladder emptying No    Mobility use a walker ability to climb steps?  no do you drive?  no  Function retired  Neuro/Psych bladder control problems weakness confusion  Prior Studies TC appt  Physicians involved in your care TC appt   Family History  Problem Relation Age of Onset   Diabetes Other        1st degree reative in 3 of her siblings   Breast cancer Mother        not sure of age   Social History   Socioeconomic History   Marital status: Widowed    Spouse name: Not on file   Number of children: Not on file   Years of education: Not on file   Highest education level: Not on file  Occupational History   Occupation: [employer]    Employer: [employer]    Comment: cafeteria   Occupation: Volunteer    Comment: [employer]  Tobacco Use   Smoking status: Former    Packs/day: 0.05    Years: 6.00    Total pack years: 0.30    Types: Cigarettes    Quit date: 09/05/1999    Years since quitting: 22.6   Smokeless tobacco: Never  Substance and Sexual Activity   Alcohol use: No    Alcohol/week: 0.0 standard drinks of alcohol   Drug use: No    Sexual activity: Not Currently  Other Topics Concern   Not on file  Social History Narrative   Retired from working as a [employer].   Widowed. (since 1997). Not sexually active since.   Volunteers at [employer].   Social Determinants of Health   Financial Resource Strain: Low Risk  (02/20/2022)   Overall Financial Resource Strain (CARDIA)    Difficulty of Paying Living Expenses: Not very hard  Food Insecurity: No Food Insecurity (02/20/2022)   Hunger Vital Sign    Worried About Running Out of Food in the Last Year: Never true    Ran Out of Food in the Last Year: Never true  Transportation Needs: No Transportation Needs (05/05/2022)   PRAPARE - Hydrologist (Medical): No    Lack of Transportation (Non-Medical): No  Physical Activity: Not on file  Stress: No Stress Concern Present (05/04/2022)   [place]    Feeling of Stress : Only a little  Social Connections: Not on file   Past Surgical History:  Procedure Laterality Date   APPENDECTOMY     AV FISTULA  PLACEMENT Left 03/10/2022   Procedure: LEFT ARM ARTERIOVENOUS (AV) FISTULA CREATION VERSUS GRAFT;  Surgeon: Waynetta Sandy, MD;  Location: [place];  Service: Vascular;  Laterality: Left;   COLON SURGERY  08/2006   Partial right colectomy and anastomosis with resection of villous adenoma.   HEMORRHOID SURGERY     hemorrhoidectomy   TONSILLECTOMY     TUBAL LIGATION     VENTRAL HERNIA REPAIR  08/2008   By Dr. Hulen Skains.   Past Medical History:  Diagnosis Date   Chronic kidney disease    Diabetes mellitus    Diet-controlled. Not on antiglycemic medications.   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  Based on the size, patient's age, status as a former smoker, and its discovery in a community setting, her probability of malignancy is 3% (low probability).  Based on this, I recommend follow-up with a CT scan around 05/2014, and if lesion is stable or smaller, no further follow-up is needed.  CT Chest 06/2015: Stable 28m pulmonary nodule. No   Ventral hernia    Incisional ventral hernia. S/P repair by Dr. 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 WHulen Skains (08/2006)   BP (!) 156/75   Pulse 73   Ht '5\' 6"'$  (1.676 m)   Wt 163 lb 6.4 oz (74.1 kg)   LMP 10/22/1978   SpO2 99%   BMI 26.37 kg/m   Opioid Risk Score:   Fall Risk Score:  `1  Depression screen PHQ 2/9     05/12/2022    2:45 PM 04/09/2022   10:29 AM 07/03/2021   11:35 AM 03/26/2021    4:05 PM 01/23/2021    9:59 AM 12/17/2020    9:59 AM 09/16/2020    9:48 AM  Depression screen PHQ 2/9  Decreased Interest 0 0 0 0 0 0 0  Down, Depressed, Hopeless 0 0 0 0 0 0 0  PHQ - 2 Score 0 0 0 0 0 0 0  Altered sleeping 0  0  0 0 0  Tired, decreased energy 1  0  0 0 0  Change in appetite 1  0  0 0 0  Feeling bad or failure about yourself  0  0  0 0 0  Trouble concentrating 0  0  0 0 0  Moving slowly or fidgety/restless 0  0  0 0 0  Suicidal thoughts 0  0  0 0 0  PHQ-9 Score 2  0  0 0 0  Difficult doing work/chores Not difficult at all  Not difficult at all  Not difficult at all Not difficult at all Not difficult at all     Review of Systems     Objective:   Physical Exam        Assessment & Plan:

## 2022-05-13 ENCOUNTER — Encounter: Payer: Self-pay | Admitting: Registered Nurse

## 2022-05-19 ENCOUNTER — Telehealth: Payer: Self-pay | Admitting: *Deleted

## 2022-05-19 ENCOUNTER — Encounter: Payer: Self-pay | Admitting: Internal Medicine

## 2022-05-19 ENCOUNTER — Other Ambulatory Visit: Payer: Self-pay | Admitting: Internal Medicine

## 2022-05-19 DIAGNOSIS — R1310 Dysphagia, unspecified: Secondary | ICD-10-CM

## 2022-05-19 NOTE — Telephone Encounter (Signed)
Call from [place] therapist with [facility]. States pt has a new onset of dysphagia; requesting her doctor to order "[place] - Modified Barium Swallowing" test (done at [place]). She stated if order has to be fax - the fax# is [phone]. Thanks

## 2022-05-21 ENCOUNTER — Other Ambulatory Visit (HOSPITAL_COMMUNITY): Payer: Self-pay

## 2022-05-21 DIAGNOSIS — N185 Chronic kidney disease, stage 5: Secondary | ICD-10-CM | POA: Diagnosis not present

## 2022-05-21 DIAGNOSIS — E876 Hypokalemia: Secondary | ICD-10-CM | POA: Diagnosis not present

## 2022-05-21 DIAGNOSIS — E1122 Type 2 diabetes mellitus with diabetic chronic kidney disease: Secondary | ICD-10-CM | POA: Diagnosis not present

## 2022-05-21 DIAGNOSIS — I120 Hypertensive chronic kidney disease with stage 5 chronic kidney disease or end stage renal disease: Secondary | ICD-10-CM | POA: Diagnosis not present

## 2022-05-21 DIAGNOSIS — N2581 Secondary hyperparathyroidism of renal origin: Secondary | ICD-10-CM | POA: Diagnosis not present

## 2022-05-21 DIAGNOSIS — Q6102 Congenital multiple renal cysts: Secondary | ICD-10-CM | POA: Diagnosis not present

## 2022-05-21 DIAGNOSIS — R809 Proteinuria, unspecified: Secondary | ICD-10-CM | POA: Diagnosis not present

## 2022-05-21 DIAGNOSIS — R1310 Dysphagia, unspecified: Secondary | ICD-10-CM

## 2022-05-22 ENCOUNTER — Ambulatory Visit (INDEPENDENT_AMBULATORY_CARE_PROVIDER_SITE_OTHER): Payer: Medicare PPO

## 2022-05-22 ENCOUNTER — Ambulatory Visit (INDEPENDENT_AMBULATORY_CARE_PROVIDER_SITE_OTHER): Payer: Medicare PPO | Admitting: Internal Medicine

## 2022-05-22 ENCOUNTER — Encounter: Payer: Self-pay | Admitting: Internal Medicine

## 2022-05-22 VITALS — BP 156/75 | Ht 66.0 in | Wt 162.0 lb

## 2022-05-22 VITALS — HR 65 | Temp 98.1°F | Wt 162.0 lb

## 2022-05-22 DIAGNOSIS — Z860100 Personal history of colon polyps, unspecified: Secondary | ICD-10-CM

## 2022-05-22 DIAGNOSIS — R4189 Other symptoms and signs involving cognitive functions and awareness: Secondary | ICD-10-CM

## 2022-05-22 DIAGNOSIS — R011 Cardiac murmur, unspecified: Secondary | ICD-10-CM

## 2022-05-22 DIAGNOSIS — I340 Nonrheumatic mitral (valve) insufficiency: Secondary | ICD-10-CM

## 2022-05-22 DIAGNOSIS — I639 Cerebral infarction, unspecified: Secondary | ICD-10-CM | POA: Diagnosis not present

## 2022-05-22 DIAGNOSIS — D631 Anemia in chronic kidney disease: Secondary | ICD-10-CM

## 2022-05-22 DIAGNOSIS — I6930 Unspecified sequelae of cerebral infarction: Secondary | ICD-10-CM

## 2022-05-22 DIAGNOSIS — E119 Type 2 diabetes mellitus without complications: Secondary | ICD-10-CM

## 2022-05-22 DIAGNOSIS — F0150 Vascular dementia without behavioral disturbance: Secondary | ICD-10-CM | POA: Insufficient documentation

## 2022-05-22 DIAGNOSIS — M10372 Gout due to renal impairment, left ankle and foot: Secondary | ICD-10-CM

## 2022-05-22 DIAGNOSIS — I69391 Dysphagia following cerebral infarction: Secondary | ICD-10-CM

## 2022-05-22 DIAGNOSIS — E1122 Type 2 diabetes mellitus with diabetic chronic kidney disease: Secondary | ICD-10-CM | POA: Diagnosis not present

## 2022-05-22 DIAGNOSIS — I77810 Thoracic aortic ectasia: Secondary | ICD-10-CM | POA: Diagnosis not present

## 2022-05-22 DIAGNOSIS — R0989 Other specified symptoms and signs involving the circulatory and respiratory systems: Secondary | ICD-10-CM | POA: Diagnosis not present

## 2022-05-22 DIAGNOSIS — N185 Chronic kidney disease, stage 5: Secondary | ICD-10-CM | POA: Diagnosis not present

## 2022-05-22 DIAGNOSIS — I10 Essential (primary) hypertension: Secondary | ICD-10-CM

## 2022-05-22 DIAGNOSIS — N2581 Secondary hyperparathyroidism of renal origin: Secondary | ICD-10-CM

## 2022-05-22 DIAGNOSIS — I129 Hypertensive chronic kidney disease with stage 1 through stage 4 chronic kidney disease, or unspecified chronic kidney disease: Secondary | ICD-10-CM

## 2022-05-22 DIAGNOSIS — E1169 Type 2 diabetes mellitus with other specified complication: Secondary | ICD-10-CM

## 2022-05-22 DIAGNOSIS — Z87891 Personal history of nicotine dependence: Secondary | ICD-10-CM

## 2022-05-22 DIAGNOSIS — Z0000 Encounter for general adult medical examination without abnormal findings: Secondary | ICD-10-CM | POA: Diagnosis not present

## 2022-05-22 DIAGNOSIS — E785 Hyperlipidemia, unspecified: Secondary | ICD-10-CM

## 2022-05-22 DIAGNOSIS — D374 Neoplasm of uncertain behavior of colon: Secondary | ICD-10-CM

## 2022-05-22 MED ORDER — CALCITRIOL 0.25 MCG PO CAPS: 0.2500 ug | ORAL_CAPSULE | ORAL | 3 refills | Status: DC

## 2022-05-22 MED ORDER — FUROSEMIDE 80 MG PO TABS: 80.0000 mg | ORAL_TABLET | ORAL | 0 refills | Status: AC

## 2022-05-22 MED ORDER — CARVEDILOL 6.25 MG PO TABS: 6.2500 mg | ORAL_TABLET | Freq: Two times a day (BID) | ORAL | 3 refills | Status: DC

## 2022-05-22 MED ORDER — AMLODIPINE BESYLATE 5 MG PO TABS: 5.0000 mg | ORAL_TABLET | Freq: Every day | ORAL | 3 refills | Status: DC

## 2022-05-22 MED ORDER — ASPIRIN 81 MG PO TBEC: 81.0000 mg | DELAYED_RELEASE_TABLET | Freq: Every day | ORAL | 3 refills | Status: DC

## 2022-05-22 MED ORDER — ROSUVASTATIN CALCIUM 20 MG PO TABS: 20.0000 mg | ORAL_TABLET | Freq: Every day | ORAL | 3 refills | Status: DC

## 2022-05-22 NOTE — Assessment & Plan Note (Signed)
Currently on amlodipine 5 mg daily, carvedilol 6.25 mg bid, lasix 80 mg q MWF as needed for edema per nephrology appt yesterday.  Fluid shifts.  She has positional drop in SBP from about 180 to 160 today which is asymptomatic.  HD in near future will involve a reassessment of medication needs.  No changes until renal f /u in 2 weeks.

## 2022-05-22 NOTE — Assessment & Plan Note (Signed)
Rec'd 4 doses Ferlicit 0/93-81/8/29.

## 2022-05-22 NOTE — Assessment & Plan Note (Signed)
Murmur remains audible.  No underlying structural abnormalities to explain etiology.  Echo updated 03/2022.

## 2022-05-22 NOTE — Assessment & Plan Note (Signed)
[facility] for PT, ST Completed 3 wks DAPT, continuing on ASA 81 mg Has neurology hospital f/u Has had PMR hospital f/u Currently walking without assistive device, slowly, steadies herself against furniture/walls, no falls since discharge.

## 2022-05-22 NOTE — Assessment & Plan Note (Signed)
Nephrology visit yesterday with Dr. Royce Macadamia.  Will f/u in 2 weeks to determine time to initiate HD.  Gina Bailey is on board.  Fistula is mature this month.  Functioning well, no steal syndrome.  No current LE edema (was once severe and chronic).  Lasix has been reduced from 80 mg daily to 80 mg MWF.

## 2022-05-22 NOTE — Assessment & Plan Note (Signed)
Pedal edema has been completely treated; pulses now easily palpable, 2+ dps each foot.  Will resolve this problem.

## 2022-05-22 NOTE — Assessment & Plan Note (Signed)
From updated echo 03/2022:  "Aorta: The aortic root and ascending aorta are structurally normal, with no evidence of dilitation. "  Will resolve problem.

## 2022-05-22 NOTE — Progress Notes (Signed)
Subjective:   Gina Bailey is a 76 y.o. female who presents for an Initial Medicare Annual Wellness Visit. I connected with  Gina Bailey on 05/22/22 by a  Face-To-Face  enabled telemedicine application and verified that I am speaking with the correct person using two identifiers.  Patient Location: Other:  Office/Clinic  Provider Location: Office/Clinic  I discussed the limitations of evaluation and management by telemedicine. The patient expressed understanding and agreed to proceed.  Review of Systems    Defer to PCP       Objective:    Today's Vitals   05/22/22 1137  PainSc: 0-No pain   There is no height or weight on file to calculate BMI.     05/22/2022   11:38 AM 05/22/2022   10:37 AM 04/22/2022    3:46 PM 04/15/2022    3:14 PM 04/09/2022    8:53 AM 03/10/2022    8:19 AM 07/03/2021   11:32 AM  Advanced Directives  Does Patient Have a Medical Advance Directive? Yes Yes Yes Yes Yes Yes Yes  Type of [place] administrator Power of [place];Living will [place];Living will [place];Living will  Does patient want to make changes to medical advance directive? No - Patient declined No - Patient declined No - Patient declined  No - Patient declined  No - Patient declined  Copy of [place] in Chart? Yes - validated most recent copy scanned in chart (See row information) Yes - validated most recent copy scanned in chart (See row information) No - copy requested  No - copy requested  No - copy requested    Current Medications (verified) Outpatient Encounter Medications as of 05/22/2022  Medication Sig   acetaminophen (TYLENOL) 325 MG tablet Take 1-2 tablets (325-650 mg total) by mouth every 4 (four) hours as needed for mild pain.   amLODipine (NORVASC) 5 MG tablet Take 1 tablet (5 mg total) by mouth daily.   aspirin EC 81  MG tablet Take 1 tablet (81 mg total) by mouth daily. Swallow whole.   calcitRIOL (ROCALTROL) 0.25 MCG capsule Take 1 capsule (0.25 mcg total) by mouth 3 (three) times a week.   carvedilol (COREG) 6.25 MG tablet Take 1 tablet (6.25 mg total) by mouth 2 (two) times daily with a meal.   docusate sodium (COLACE) 100 MG capsule Take 100 mg by mouth daily.   furosemide (LASIX) 80 MG tablet Take 1 tablet (80 mg total) by mouth 3 (three) times a week. M/W/F as needed for swellling per Dr. Royce Macadamia   rosuvastatin (CRESTOR) 20 MG tablet Take 1 tablet (20 mg total) by mouth daily.   No facility-administered encounter medications on file as of 05/22/2022.    Allergies (verified) Metformin and related, Pineapple, and Zestril [lisinopril]   History: Past Medical History:  Diagnosis Date   Chronic kidney disease    Diabetes mellitus    Diet-controlled. Not on antiglycemic medications.   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  Based on the size, patient's age, status as a former smoker, and its discovery in a community setting, her probability of malignancy  is 3% (low probability).  Based on this, I recommend follow-up with a CT scan around 05/2014, and if lesion is stable or smaller, no further follow-up is needed.  CT Chest 06/2015: Stable 20m pulmonary nodule. No   Ventral hernia    Incisional ventral hernia. S/P repair by Dr. 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 WHulen Skains (08/2006)   Past Surgical History:  Procedure Laterality Date   APPENDECTOMY     AV FISTULA PLACEMENT Left 03/10/2022   Procedure: LEFT ARM  ARTERIOVENOUS (AV) FISTULA CREATION VERSUS GRAFT;  Surgeon: CWaynetta Sandy MD;  Location: [facility]  Service: Vascular;  Laterality: Left;   COLON SURGERY  08/2006   Partial right colectomy and anastomosis with resection of villous adenoma.   HEMORRHOID SURGERY     hemorrhoidectomy   TONSILLECTOMY     TUBAL LIGATION     VENTRAL HERNIA REPAIR  08/2008   By Dr. WHulen Skains   Family History  Problem Relation Age of Onset   Diabetes Other        1st degree reative in 3 of her siblings   Breast cancer Mother        not sure of age   Social History   Socioeconomic History   Marital status: Widowed    Spouse name: Not on file   Number of children: Not on file   Years of education: Not on file   Highest education level: Not on file  Occupational History   Occupation: [employer]   Employer: [employer]    Comment: cafeteria   Occupation: Volunteer    Comment: [employer]  Tobacco Use   Smoking status: Former    Packs/day: 0.05    Years: 6.00    Total pack years: 0.30    Types: Cigarettes    Quit date: 09/05/1999    Years since quitting: 22.7   Smokeless tobacco: Never  Substance and Sexual Activity   Alcohol use: No    Alcohol/week: 0.0 standard drinks of alcohol   Drug use: No   Sexual activity: Not Currently  Other Topics Concern   Not on file  Social History Narrative   Retired from working as a [employer]   Widowed. (since 1997). Not sexually active since.   Volunteers at [employer]   Social Determinants of Health   Financial Resource Strain: Medium Risk (05/22/2022)   Overall Financial Resource Strain (CARDIA)    Difficulty of Paying Living Expenses: Somewhat hard  Food Insecurity: No Food Insecurity (05/22/2022)   Hunger Vital Sign    Worried About Running Out of Food in the Last Year: Never true    Ran Out of Food in the Last Year: Never true  Transportation Needs: No Transportation Needs (05/22/2022)   PRAPARE -  THydrologist(Medical): No    Lack of Transportation (Non-Medical): No  Physical Activity: Inactive (05/22/2022)   Exercise Vital Sign    Days of Exercise per Week: 0 days    Minutes of Exercise per Session: 0 min  Stress: No Stress Concern Present (05/22/2022)   F[place]   Feeling of Stress : Not at all  Social Connections: Socially Isolated (05/22/2022)   Social Connection and Isolation Panel [  NHANES]    Frequency of Communication with Friends and Family: Once a week    Frequency of Social Gatherings with Friends and Family: Once a week    Attends Religious Services: Never    Marine scientist or Organizations: No    Attends Archivist Meetings: Never    Marital Status: Widowed    Tobacco Counseling Counseling given: Not Answered   Clinical Intake:  Pre-visit preparation completed: Yes  Pain : No/denies pain Pain Score: 0-No pain     Nutritional Risks: None Diabetes: Yes  How often do you need to have someone help you when you read instructions, pamphlets, or other written materials from your doctor or pharmacy?: 4 - Often  Diabetic?Yes  Interpreter Needed?: No  Information entered by :: Corey Skains Emaly Boschert,cma 05/22/22 11:38am   Activities of Daily Living    05/22/2022   11:38 AM 05/22/2022   10:37 AM  In your present state of health, do you have any difficulty performing the following activities:  Hearing? 1 1  Vision? 0 0  Difficulty concentrating or making decisions? 1 1  Walking or climbing stairs? 1 1  Dressing or bathing? 1 1  Doing errands, shopping? 1 1    Patient Care Team: Angelica Pou, MD as PCP - General (Internal Medicine) Clent Jacks, MD as Consulting Physician (Ophthalmology)  Indicate any recent Medical Services you may have received from other than [facility] providers in the past year (date may be approximate).     Assessment:    This is a routine wellness examination for Gina Bailey.  Hearing/Vision screen No results found.  Dietary issues and exercise activities discussed:     Goals Addressed   None   Depression Screen    05/22/2022   11:38 AM 05/22/2022   11:25 AM 05/12/2022    2:45 PM 04/09/2022   10:29 AM 07/03/2021   11:35 AM 03/26/2021    4:05 PM 01/23/2021    9:59 AM  PHQ 2/9 Scores  PHQ - 2 Score 0 0 0 0 0 0 0  PHQ- 9 Score 0  2  0  0    Fall Risk    05/22/2022   11:38 AM 05/22/2022   11:25 AM 05/12/2022    2:45 PM 04/09/2022    8:46 AM 07/03/2021   11:34 AM  Fall Risk   Falls in the past year? 0 0 0 1 0  Number falls in past yr: 0 0  0 0  Injury with Fall? 0 0  0 0  Risk for fall due to : No Fall Risks Impaired balance/gait;Impaired mobility     Follow up Falls evaluation completed;Falls prevention discussed Falls evaluation completed  Falls evaluation completed Falls evaluation completed    FALL RISK PREVENTION PERTAINING TO THE HOME:  Any stairs in or around the home? No  If so, are there any without handrails? No  Home free of loose throw rugs in walkways, pet beds, electrical cords, etc? Yes  Adequate lighting in your home to reduce risk of falls? Yes   ASSISTIVE DEVICES UTILIZED TO PREVENT FALLS:  Life alert? No  Use of a cane, walker or w/c? Yes  Grab bars in the bathroom? Yes  Shower chair or bench in shower? Yes  Elevated toilet seat or a handicapped toilet? Yes   TIMED UP AND GO:  Was the test performed? No .  Length of time to ambulate 10 feet: N/A   Gait slow and steady with assistive device  Cognitive Function:        Immunizations Immunization History  Administered Date(s) Administered   Fluad Quad(high Dose 65+) 04/07/2019, 03/26/2021, 04/09/2022   Influenza Split 04/19/2012, 04/17/2020   Influenza Whole 06/07/2008, 04/10/2009, 05/01/2010, 03/30/2011   Influenza,inj,Quad PF,6+ Mos 04/24/2013, 05/22/2014, 06/04/2015, 03/26/2016, 05/11/2017, 04/19/2018    Influenza-Unspecified 04/23/2020   PFIZER(Purple Top)SARS-COV-2 Vaccination 08/09/2019, 08/30/2019, 06/11/2020   Pneumococcal Conjugate-13 06/04/2015   Pneumococcal Polysaccharide-23 06/07/2008, 06/06/2013   Td 06/03/2010    TDAP status: Due, Education has been provided regarding the importance of this vaccine. Advised may receive this vaccine at local pharmacy or Health Dept. Aware to provide a copy of the vaccination record if obtained from local pharmacy or Health Dept. Verbalized acceptance and understanding.  Flu Vaccine status: Up to date  Pneumococcal vaccine status: Up to date  Covid-19 vaccine status: Completed vaccines  Qualifies for Shingles Vaccine? No   Zostavax completed No   Shingrix Completed?: No.    Education has been provided regarding the importance of this vaccine. Patient has been advised to call insurance company to determine out of pocket expense if they have not yet received this vaccine. Advised may also receive vaccine at local pharmacy or Health Dept. Verbalized acceptance and understanding.  Screening Tests Health Maintenance  Topic Date Due   Zoster Vaccines- Shingrix (1 of 2) Never done   TETANUS/TDAP  06/03/2020   OPHTHALMOLOGY EXAM  07/19/2022 (Originally 10/15/2021)   HEMOGLOBIN A1C  07/09/2022   Diabetic kidney evaluation - Urine ACR  09/08/2022   FOOT EXAM  03/07/2023   Diabetic kidney evaluation - GFR measurement  04/28/2023   Medicare Annual Wellness (AWV)  05/23/2023   Pneumonia Vaccine 75+ Years old  Completed   INFLUENZA VACCINE  Completed   DEXA SCAN  Completed   Hepatitis C Screening  Completed   HPV VACCINES  Aged Out   COLONOSCOPY (Pts 45-68yr Insurance coverage will need to be confirmed)  Discontinued   COVID-19 Vaccine  Discontinued    Health Maintenance  Health Maintenance Due  Topic Date Due   Zoster Vaccines- Shingrix (1 of 2) Never done   TETANUS/TDAP  06/03/2020    Colorectal cancer screening: Type of screening:  Colonoscopy. Completed 08/19/2011. Repeat every 0 years   Bone Density status: Completed 08/09/2013. Results reflect: Bone density results: NORMAL. Repeat every 0 years.  Lung Cancer Screening: (Low Dose CT Chest recommended if Age 78-80years, 30 pack-year currently smoking OR have quit w/in 15years.) does not qualify.   Lung Cancer Screening Referral: N/A  Additional Screening:  Hepatitis C Screening: does not qualify; Completed 05/24/2018  Vision Screening: Recommended annual ophthalmology exams for early detection of glaucoma and other disorders of the eye. Is the patient up to date with their annual eye exam?  Yes  Who is the provider or what is the name of the office in which the patient attends annual eye exams? Dr.Groat If pt is not established with a provider, would they like to be referred to a provider to establish care? No .   Dental Screening: Recommended annual dental exams for proper oral hygiene  Community Resource Referral / Chronic Care Management: CRR required this visit?  No   CCM required this visit?  No      Plan:     I have personally reviewed and noted the following in the patient's chart:   Medical and social history Use of alcohol, tobacco or illicit drugs  Current medications and supplements including opioid prescriptions. Patient is not  currently taking opioid prescriptions. Functional ability and status Nutritional status Physical activity Advanced directives List of other physicians Hospitalizations, surgeries, and ER visits in previous 12 months Vitals Screenings to include cognitive, depression, and falls Referrals and appointments  In addition, I have reviewed and discussed with patient certain preventive protocols, quality metrics, and best practice recommendations. A written personalized care plan for preventive services as well as general preventive health recommendations were provided to patient.     Kerin Perna, [facility]   05/22/2022    Nurse Notes: Face-To-Face visit.

## 2022-05-22 NOTE — Assessment & Plan Note (Signed)
L great toe acute flare during hospitalization early 04/2022 treated with prednisone.  The only other mention of gout in the chart was an episode in 2020.

## 2022-05-22 NOTE — Assessment & Plan Note (Signed)
Rosuvastatin 20 mg re-initiated during hospitalization 03/2022.  Recheck lipids next visit 69M.

## 2022-05-22 NOTE — Assessment & Plan Note (Signed)
Currently tolerating a D3 diet prepared by her son and dtr in law.  Working with a Astronomer; a MBS has been ordered to further evaluate her swallowing function.  She has not been observed to choke or cough during meals per Jesse/son.

## 2022-05-22 NOTE — Assessment & Plan Note (Signed)
A1c 5.4 04/2022 on no medications.  Does not monitor blood sugar.  Will remove CBG monitoring devices/supplies from her med list.

## 2022-05-22 NOTE — Assessment & Plan Note (Signed)
>>  ASSESSMENT AND PLAN FOR PERSONAL HISTORY OF STROKE WITH CURRENT RESIDUAL EFFECTS (MILD L HEMIPARESIS) WRITTEN ON 05/22/2022  2:29 PM BY Angelica Pou, MD  [facility] for [facility], ST Completed 3 wks DAPT, continuing on ASA 81 mg Has neurology hospital f/u Has had PMR hospital f/u Currently walking without assistive device, slowly, steadies herself against furniture/walls, no falls since discharge.

## 2022-05-22 NOTE — Assessment & Plan Note (Signed)
No MVR or structural abnormality on 03/2022 echo (nml MV).  Will resolve problem.

## 2022-05-22 NOTE — Assessment & Plan Note (Signed)
For some time Gina Bailey has demonstrated confusion about her medications and her office f/u had been inconsistent.  Cognitive impairment is a concern also observed by her family.  Not formally evaluated, though likelihood is high for a combination of vascular and Alzheimer's type dementia.  Her cognitive processing is slower and her language decreased since her stroke.  I mentioned this today to her son, noting that the most important element in care is preserving safety - now that medicines are being managed by her family and she is in a supervised environment, these goals are being met as well as can be.  Next step is initiating HD.  Not uremic currently.  We can then discuss anticholinesterase medications (benefit would be modest at best).  Her son [name] acknowledges.

## 2022-05-22 NOTE — Progress Notes (Signed)
Gina Bailey presents for hospital f/u after an acute CVA.  She has been doing well with no new complaints, aside from some occasional "griping and boiling" of her intestines in RLQ which has been followed by large volume void with urgency.  Suspecting adverse effect of Senna-S, her son and dil changed regimen to colace, and there has not been another episode.  Gina Bailey is proud of her ability to get around.  She feels she is doing better with mobility. She doesn't experience choking/coughing episodes during meals.  Her son Gina Bailey agrees with her improvement, notes mild onoging difficulty with the L side.    Had a f/u with nephrology yesterday, recheck 2 weeks, then discuss when to initiate HD.   No medicine changes made aside from reducing frequency of lasix 80 mg from daily to M/W/F.  Zio patch has been placed to ascertain whether underlying AF might explain the stroke pattern.  Neuro hosp f/u upcoming.  Physical Exam  Pulse 65   Temp 98.1 F (36.7 C) (Oral)   Wt 162 lb (73.5 kg)   LMP 10/22/1978   SpO2 100%   BMI 26.15 kg/m   Soft spoken, responds with short answers and minimal elaboration.  Hearing is reduced at normal conversational volume.  R ear canal patent with nml TM.  L canal occluded with cerumen.  Able to hear finger rub at R ear but not L. L facial droop.  Heart RRR with soft blowing systolic murmur LUSB, early-mid systolic murmur lower pitch over RUSB.  No JVD.  Decreased skin turgor over chest, arms, legs.  LE compression stockings in place.  No LE edema, the first time I've seen her legs non-edematous.  Skin of LE's is loose.  Feet are warm, dp pulses are strongly palpable.  LUE fistula with thrill.  L hand warm with palpable radial pulse.   Pushes off with effort to stand.  Walks short distance, slowly,  unassisted in the hall, demonstrating a rocking (no pain) gait, frequently reaches out to steady herself against the wall.  Able to lower herself slowly into the transport  chair.  L hemiparesis is very mild.    Assessment and Plan (not in order of importance)  Absent pedal pulses Pedal edema has been completely treated; pulses now easily palpable, 2+ dps each foot.  Will resolve this problem.    Mitral regurgitation No MVR or structural abnormality on 03/2022 echo (nml MV).  Will resolve problem.    Aortic root dilation (HCC) From updated echo 03/2022:  "Aorta: The aortic root and ascending aorta are structurally normal, with no evidence of dilitation. "  Will resolve problem.    Heart murmur, systolic Murmur remains audible.  No underlying structural abnormalities to explain etiology.  Echo updated 03/2022.    CKD (chronic kidney disease) stage 5, GFR less than 15 ml/min [facility]) Nephrology visit yesterday with Dr. Royce Macadamia.  Will f/u in 2 weeks to determine time to initiate HD.  Gina Bailey is on board.  Fistula is mature this month.  Functioning well, no steal syndrome.  No current LE edema (was once severe and chronic).  Lasix has been reduced from 80 mg daily to 80 mg MWF.  Hyperlipidemia associated with type 2 diabetes mellitus (HCC) Rosuvastatin 20 mg re-initiated during hospitalization 03/2022.  Recheck lipids next visit 75M.    Gout due to renal impairment L great toe acute flare during hospitalization early 04/2022 treated with prednisone.  The only other mention of gout in the chart  was an episode in 2020.    Dysphagia due to recent stroke Currently tolerating a D3 diet prepared by her son and dtr in law.  Working with a Astronomer; a MBS has been ordered to further evaluate her swallowing function.  She has not been observed to choke or cough during meals per Jesse/son.    Hypertension Currently on amlodipine 5 mg daily, carvedilol 6.25 mg bid, lasix 80 mg q MWF as needed for edema per nephrology appt yesterday.  Fluid shifts.  She has positional drop in SBP from about 180 to 160 today which is asymptomatic.  HD in near future will involve a  reassessment of medication needs.  No changes until renal f /u in 2 weeks.    Diabetes mellitus type 2, diet-controlled ([place]) A1c 5.4 04/2022 on no medications.  Does not monitor blood sugar.  Will remove CBG monitoring devices/supplies from her med list.    Anemia of chronic renal failure Rec'd 4 doses Ferlicit 9/98-33/8/25.    Personal history of stroke with current residual effects (L hemiparesis, dysphagia) [facility] for PT, ST Completed 3 wks DAPT, continuing on ASA 81 mg Has neurology hospital f/u Has had PMR hospital f/u Currently walking without assistive device, slowly, steadies herself against furniture/walls, no falls since discharge.    Cognitive impairment For some time Gina Bailey has demonstrated confusion about her medications and her office f/u had been inconsistent.  Cognitive impairment is a concern also observed by her family.  Not formally evaluated, though likelihood is high for a combination of vascular and Alzheimer's type dementia.  Her cognitive processing is slower and her language decreased since her stroke.  I mentioned this today to her son, noting that the most important element in care is preserving safety - now that medicines are being managed by her family and she is in a supervised environment, these goals are being met as well as can be.  Next step is initiating HD.  Not uremic currently.  We can then discuss anticholinesterase medications (benefit would be modest at best).  Her son Gina Bailey acknowledges.    F/u in 3 months.  By this time she will likely have begun HD.

## 2022-05-25 ENCOUNTER — Ambulatory Visit (HOSPITAL_COMMUNITY)
Admission: RE | Admit: 2022-05-25 | Discharge: 2022-05-25 | Disposition: A | Discharge status: Home / Self Care | Payer: PRIVATE HEALTH INSURANCE | Source: Ambulatory Visit | Attending: Internal Medicine | Admitting: Internal Medicine

## 2022-05-25 DIAGNOSIS — K219 Gastro-esophageal reflux disease without esophagitis: Secondary | ICD-10-CM | POA: Insufficient documentation

## 2022-05-25 DIAGNOSIS — R059 Cough, unspecified: Secondary | ICD-10-CM | POA: Insufficient documentation

## 2022-05-25 DIAGNOSIS — R1310 Dysphagia, unspecified: Secondary | ICD-10-CM

## 2022-05-25 DIAGNOSIS — R6339 Other feeding difficulties: Secondary | ICD-10-CM | POA: Diagnosis not present

## 2022-05-26 ENCOUNTER — Encounter: Payer: Self-pay | Admitting: Dietician

## 2022-05-26 NOTE — Progress Notes (Signed)
Internal Medicine Clinic Attending  I saw and evaluated the patient.  I personally confirmed the key portions of the AWV as documented by Kerin Perna. My visit is documented separately.

## 2022-06-01 DIAGNOSIS — N185 Chronic kidney disease, stage 5: Secondary | ICD-10-CM | POA: Diagnosis not present

## 2022-06-04 ENCOUNTER — Telehealth: Payer: Self-pay | Admitting: Neurology

## 2022-06-04 DIAGNOSIS — E1122 Type 2 diabetes mellitus with diabetic chronic kidney disease: Secondary | ICD-10-CM | POA: Diagnosis not present

## 2022-06-04 DIAGNOSIS — N2581 Secondary hyperparathyroidism of renal origin: Secondary | ICD-10-CM | POA: Diagnosis not present

## 2022-06-04 DIAGNOSIS — I120 Hypertensive chronic kidney disease with stage 5 chronic kidney disease or end stage renal disease: Secondary | ICD-10-CM | POA: Diagnosis not present

## 2022-06-04 DIAGNOSIS — E876 Hypokalemia: Secondary | ICD-10-CM | POA: Diagnosis not present

## 2022-06-04 DIAGNOSIS — N185 Chronic kidney disease, stage 5: Secondary | ICD-10-CM | POA: Diagnosis not present

## 2022-06-04 DIAGNOSIS — R809 Proteinuria, unspecified: Secondary | ICD-10-CM | POA: Diagnosis not present

## 2022-06-04 DIAGNOSIS — Q6102 Congenital multiple renal cysts: Secondary | ICD-10-CM | POA: Diagnosis not present

## 2022-06-04 NOTE — Telephone Encounter (Signed)
LVM and sent mychart msg informing pt of r/s needed for 11/20 appt- MD emergency.

## 2022-06-05 ENCOUNTER — Telehealth: Payer: Self-pay

## 2022-06-05 NOTE — Addendum Note (Signed)
Encounter addended by: Gerarda Gunther on: 06/05/2022 2:59 PM  Actions taken: Imaging Exam ended

## 2022-06-05 NOTE — Telephone Encounter (Signed)
   Cardiac Monitor Alert  Date of alert:  06/05/2022   Patient Name: Gina Bailey  DOB: 09/25/1945  MRN: [id number]   [facility] Cardiologist: None  [facility] EP:  None    Monitor Information: Long Term Monitor-Live Telemetry [ZioAT]  Reason:  Surveillance of Atrial Fibrillation for patient with cryptogenic stroke Ordering provider:  Tommye Standard, PA while patient was in the hospital   Alert Atrial Fibrillation/Flutter iRythym called to report a late finding of Atrial Fibrillation on the ZIO AT monitor that the patient wore that was not picked up while wearing the monitor. Range was 107-149 bpm for a duration of 96.8 seconds on 10/25 at 5:47 AM. They will upload results to site.  This is the 1st alert for this rhythm.  The patient has no hx of Atrial Fibrillation/Flutter.  The patient is not currently on anticoagulation.  Next Cardiology Appointment  Not established with cardiology The patient could NOT be reached by telephone today.        Other: Will consult with DOD once strips are available. Patient's labs on 10/9 show Cr- 5.5 and Hgb- 8.6  Cleon Gustin, [place]  06/05/2022 12:38 PM

## 2022-06-05 NOTE — Telephone Encounter (Signed)
Monitor results taken to Dr. Angelena Form, DOD.  He advised the patient be seen early next week by DOD to discuss monitor and anticoagulation.  I have scheduled the patient to see Dr. Johney Frame Tues 11/21 at 2:30.  Tried calling the patient to discuss and was unable to reach but did leave a message to call the office today before 5pm or Monday morning.  Also advised of the appointment that I scheduled on voicemail to discuss her monitor results.

## 2022-06-07 NOTE — Progress Notes (Unsigned)
Cardiology Office Note:    Date:  06/07/2022   ID:  Gina Bailey, DOB [birth year], MRN [id number]  PCP:  Angelica Pou, MD   [facility] Providers Cardiologist:  None    Referring MD: Angelica Pou, MD    History of Present Illness:    Gina Bailey is a 76 y.o. female with a hx of HTN, HLD, DMII, CKD and CVA who was referred by Dr. Jimmye Norman for further evaluation for Afib.  Past Medical History:  Diagnosis Date   Chronic kidney disease    Diabetes mellitus    Diet-controlled. Not on antiglycemic medications.   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  Based on the size, patient's age, status as a former smoker, and its discovery in a community setting, her probability of malignancy is 3% (low probability).  Based on this, I recommend follow-up with a CT scan around 05/2014, and if lesion is stable or smaller, no further follow-up is needed.  CT Chest 06/2015: Stable 5m pulmonary nodule. No   Ventral hernia    Incisional ventral hernia. S/P repair by Dr. 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 WHulen Skains (08/2006)    Past Surgical History:  Procedure Laterality Date   APPENDECTOMY     AV FISTULA PLACEMENT Left 03/10/2022   Procedure: LEFT ARM ARTERIOVENOUS (AV) FISTULA CREATION VERSUS GRAFT;  Surgeon: CWaynetta Sandy MD;  [facility]  Service: Vascular;  Laterality: Left;   COLON SURGERY  08/2006   Partial right colectomy and anastomosis with resection of villous adenoma.   HEMORRHOID SURGERY     hemorrhoidectomy    TONSILLECTOMY     TUBAL LIGATION     VENTRAL HERNIA REPAIR  08/2008   By Dr. WHulen Skains    Current Medications: No outpatient medications have been marked as taking for the 06/09/22 encounter (Appointment) with PFreada Bergeron MD.     Allergies:   Metformin and related, Pineapple, and Zestril [lisinopril]   Social History   Socioeconomic History   Marital status: Widowed    Spouse name: Not on file   Number of children: Not on file   Years of education: Not on file   Highest education level: Not on file  Occupational History   Occupation: [place]   Employer: [employer]    Comment: cafeteria   Occupation: Volunteer    Comment: [employer]  Tobacco Use   Smoking status: Former    Packs/day: 0.05    Years: 6.00    Total pack years: 0.30    Types: Cigarettes    Quit date: 09/05/1999    Years since quitting: 22.7   Smokeless tobacco: Never  Substance and Sexual Activity   Alcohol use: No    Alcohol/week: 0.0 standard drinks of alcohol   Drug use: No   Sexual activity: Not Currently  Other Topics Concern   Not on file  Social History Narrative   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   Widowed. (since 1997). Not sexually active since.   Volunteers at [employer].   Social Determinants of Health   Financial Resource Strain: Medium Risk (05/22/2022)   Overall Financial Resource Strain (CARDIA)    Difficulty of Paying Living Expenses: Somewhat hard  Food Insecurity: No Food Insecurity (05/22/2022)   Hunger Vital Sign    Worried About Running Out of Food in the Last Year: Never true    Ran Out of Food in the Last Year: Never true  Transportation Needs: No Transportation Needs (05/22/2022)   PRAPARE - Hydrologist (Medical): No    Lack of Transportation (Non-Medical): No  Physical Activity: Inactive (05/22/2022)   Exercise Vital Sign    Days of Exercise per Week: 0 days    Minutes of Exercise  per Session: 0 min  Stress: No Stress Concern Present (05/22/2022)   [place]    Feeling of Stress : Not at all  Social Connections: Socially Isolated (05/22/2022)   Social Connection and Isolation Panel [NHANES]    Frequency of Communication with Friends and Family: Once a week    Frequency of Social Gatherings with Friends and Family: Once a week    Attends Religious Services: Never    Marine scientist or Organizations: No    Attends Archivist Meetings: Never    Marital Status: Widowed     Family History: The patient's ***family history includes Breast cancer in her mother; Diabetes in an other family member.  ROS:   Please see the history of present illness.    *** All other systems reviewed and are negative.  EKGs/Labs/Other Studies Reviewed:    The following studies were reviewed today: Cardiac Monitor 05/2022: Patch Wear Time:  16 days and 4 hours (2023-10-10T13:03:14-0400 to 2023-11-05T18:30:23-0500)   Monitor 1 Predominant rhythm was sinus rhythm Less than 1% ventricular and supraventricular ectopy burden No triggered episodes recorded   Monitor 2 Predominant rhythm was sinus rhythm 86 SVT runs occurred, all less than 45 seconds Less than 1% atrial fibrillation burden Less than 1% ventricular and supraventricular ectopy No triggered episodes recorded  TTE 03/2022: IMPRESSIONS     1. Left ventricular ejection fraction, by estimation, is 60 to 65%. The  left ventricle has normal function. The left ventricle has no regional  wall motion abnormalities. There is moderate left ventricular hypertrophy.  Left ventricular diastolic  parameters are indeterminate.   2. Right ventricular systolic function is normal. The right ventricular  size is normal. There is normal pulmonary artery systolic pressure.   3. Large pleural effusion.   4. The mitral valve is normal in structure. No  evidence of mitral valve  regurgitation.   5. The aortic valve is tricuspid. Aortic valve regurgitation is not  visualized.   6. The inferior vena cava is normal in size with greater than 50%  respiratory variability, suggesting right atrial pressure of 3 mmHg.   7. Agitated saline contrast bubble study was negative, with no evidence  of any interatrial shunt.   Comparison(s): No significant change from prior study.   EKG:  EKG is *** ordered today.  The ekg ordered today demonstrates ***  Recent Labs: 04/15/2022: TSH 1.669 04/23/2022: ALT 37 04/27/2022: BUN 107; Creatinine, Ser 5.55; Hemoglobin 8.6; Magnesium 2.9; Platelets 344; Potassium 4.4; Sodium 142  Recent Lipid Panel    Component Value Date/Time   CHOL 223 (H) 04/09/2022 0945  TRIG 58 04/09/2022 0945   HDL 78 04/09/2022 0945   CHOLHDL 2.9 04/09/2022 0945   CHOLHDL 2.3 05/22/2014 1635   VLDL 12 05/22/2014 1635   LDLCALC 135 (H) 04/09/2022 0945     Risk Assessment/Calculations:   {Does this patient have ATRIAL FIBRILLATION?:[phone]}  No BP recorded.  {Refresh Note OR Click here to enter BP  :1}***         Physical Exam:    VS:  LMP 10/22/1978     Wt Readings from Last 3 Encounters:  05/22/22 162 lb (73.5 kg)  05/22/22 162 lb (73.5 kg)  05/12/22 163 lb 6.4 oz (74.1 kg)     GEN: *** Well nourished, well developed in no acute distress HEENT: Normal NECK: No JVD; No carotid bruits LYMPHATICS: No lymphadenopathy CARDIAC: ***RRR, no murmurs, rubs, gallops RESPIRATORY:  Clear to auscultation without rales, wheezing or rhonchi  ABDOMEN: Soft, non-tender, non-distended MUSCULOSKELETAL:  No edema; No deformity  SKIN: Warm and dry NEUROLOGIC:  Alert and oriented x 3 PSYCHIATRIC:  Normal affect   ASSESSMENT:    No diagnosis found. PLAN:    In order of problems listed above:  ***      {Are you ordering a CV Procedure (e.g. stress test, cath, DCCV, TEE, etc)?   Press F2        :[id number]}     Medication Adjustments/Labs and Tests Ordered: Current medicines are reviewed at length with the patient today.  Concerns regarding medicines are outlined above.  No orders of the defined types were placed in this encounter.  No orders of the defined types were placed in this encounter.   There are no Patient Instructions on file for this visit.   Signed, Freada Bergeron, MD  06/07/2022 2:39 PM    [place]

## 2022-06-08 ENCOUNTER — Inpatient Hospital Stay: Payer: Medicare PPO | Admitting: Neurology

## 2022-06-09 ENCOUNTER — Ambulatory Visit: Payer: Medicare PPO | Attending: Cardiology | Admitting: Cardiology

## 2022-06-09 VITALS — BP 168/68 | HR 102 | Ht 65.0 in | Wt 164.0 lb

## 2022-06-09 DIAGNOSIS — D6869 Other thrombophilia: Secondary | ICD-10-CM | POA: Diagnosis not present

## 2022-06-09 DIAGNOSIS — Z8673 Personal history of transient ischemic attack (TIA), and cerebral infarction without residual deficits: Secondary | ICD-10-CM

## 2022-06-09 DIAGNOSIS — I480 Paroxysmal atrial fibrillation: Secondary | ICD-10-CM | POA: Diagnosis not present

## 2022-06-09 DIAGNOSIS — I10 Essential (primary) hypertension: Secondary | ICD-10-CM | POA: Diagnosis not present

## 2022-06-09 DIAGNOSIS — N185 Chronic kidney disease, stage 5: Secondary | ICD-10-CM

## 2022-06-09 MED ORDER — APIXABAN 5 MG PO TABS: 5.0000 mg | ORAL_TABLET | Freq: Two times a day (BID) | ORAL | 2 refills | Status: DC

## 2022-06-09 NOTE — Patient Instructions (Signed)
Medication Instructions:   STOP TAKING ASPIRIN NOW  START TAKING ELIQUIS 5 MG BY MOUTH TWICE DAILY  *If you need a refill on your cardiac medications before your next appointment, please call your pharmacy*   Follow-Up: At [facility], you and your health needs are our priority.  As part of our continuing mission to provide you with exceptional heart care, we have created designated Provider Care Teams.  These Care Teams include your primary Cardiologist (physician) and Advanced Practice Providers (APPs -  Physician Assistants and Nurse Practitioners) who all work together to provide you with the care you need, when you need it.  We recommend signing up for the patient portal called "MyChart".  Sign up information is provided on this After Visit Summary.  MyChart is used to connect with patients for Virtual Visits (Telemedicine).  Patients are able to view lab/test results, encounter notes, upcoming appointments, etc.  Non-urgent messages can be sent to your provider as well.   To learn more about what you can do with MyChart, go to [email or web address].    Your next appointment:   6 month(s)  The format for your next appointment:   In Person  Provider:   DR. Johney Frame    Important Information About Sugar

## 2022-06-09 NOTE — Progress Notes (Addendum)
Cardiology Office Note:    Date:  06/09/2022   ID:  Gina Bailey, DOB 28-Apr-1946, MRN [id number]  PCP:  Gina Pou, MD   [facility] Providers Cardiologist:  None    Referring MD: Gina Pou, MD    History of Present Illness:    Gina Bailey is a 76 y.o. female with a hx of HTN, HLD, DMII, CKD and CVA who was referred by Dr. Jimmye Bailey for further evaluation for Afib.  She was admitted to the hospital on 04/15/2022 after presenting with sudden onset left sided weakness, aphasia and dysarthria. MRI revealed acute lacunar infarct in the R internal capsule and L periventricular white matter. She was started on aspirin and Plavix for 3 weeks followed by aspirin alone. Crestor 20 mg was started.  She was admitted to rehab 04/22/2022. Concern for mental status changes on 10/9. Code stroke called and neurology evaluated. CT head performed without acute abnormalities. MRI/MRA/EEG obtained.  Temporary worsening of deficits resolved and likely secondary to hypoperfusion. MRI showed new punctate stroke in right frontal white matter as well as increased size of infarct in the right internal capsule, concerning for embolic source.  Therefore, ZIO AT monitor for surveillance of atrial fibrillation arranged via electrophysiology department.   Per telephone note on 06/05/2022, [facility] called to report a late finding of Atrial Fibrillation on the ZIO AT monitor, which was reviewed by the DOD that day, who advised for follow-up with cardiology.  Today, the patient states that she has had an improvement in her motor dysfunction since the stroke. She also notes no history of cardiac abnormalities. Denies any history of palpitations or chest pain. No dyspnea on exertion with ADL.   She denies any palpitations, chest pain, shortness of breath, or peripheral edema. No lightheadedness, headaches, syncope, orthopnea, or PND.   Past Medical History:  Diagnosis Date   Chronic kidney  disease    Diabetes mellitus    Diet-controlled. Not on antiglycemic medications.   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  Based on the size, patient's age, status as a former smoker, and its discovery in a community setting, her probability of malignancy is 3% (low probability).  Based on this, I recommend follow-up with a CT scan around 05/2014, and if lesion is stable or smaller, no further follow-up is needed.  CT Chest 06/2015: Stable 47m pulmonary nodule. No   Ventral hernia    Incisional ventral hernia. S/P repair by Dr. 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 WHulen Skains (08/2006)    Past Surgical History:  Procedure Laterality Date   APPENDECTOMY     AV FISTULA PLACEMENT Left 03/10/2022   Procedure: LEFT ARM ARTERIOVENOUS (AV) FISTULA CREATION VERSUS GRAFT;  Surgeon: CWaynetta Sandy MD;  [place]  Service: Vascular;  Laterality: Left;   COLON SURGERY  08/2006   Partial right colectomy and anastomosis with resection of villous adenoma.   HEMORRHOID SURGERY     hemorrhoidectomy   TONSILLECTOMY  TUBAL LIGATION     VENTRAL HERNIA REPAIR  08/2008   By Dr. Hulen Skains.    Current Medications: Current Meds  Medication Sig   acetaminophen (TYLENOL) 325 MG tablet Take 1-2 tablets (325-650 mg total) by mouth every 4 (four) hours as needed for mild pain.   amLODipine (NORVASC) 5 MG tablet Take 1 tablet (5 mg total) by mouth daily.   apixaban (ELIQUIS) 5 MG TABS tablet Take 1 tablet (5 mg total) by mouth 2 (two) times daily.   calcitRIOL (ROCALTROL)  0.25 MCG capsule Take 1 capsule (0.25 mcg total) by mouth 3 (three) times a week.   carvedilol (COREG) 6.25 MG tablet Take 1 tablet (6.25 mg total) by mouth 2 (two) times daily with a meal.   docusate sodium (COLACE) 100 MG capsule Take 100 mg by mouth daily.   furosemide (LASIX) 80 MG tablet Take 1 tablet (80 mg total) by mouth 3 (three) times a week. M/W/F as needed for swellling per Dr. Royce Macadamia   potassium chloride (MICRO-K) 10 MEQ CR capsule Take 10 mEq by mouth daily.   rosuvastatin (CRESTOR) 20 MG tablet Take 1 tablet (20 mg total) by mouth daily.   [DISCONTINUED] aspirin EC 81 MG tablet Take 1 tablet (81 mg total) by mouth daily. Swallow whole.     Allergies:   Metformin and related, Pineapple, and Zestril [lisinopril]   Social History   Socioeconomic History   Marital status: Widowed    Spouse name: Not on file   Number of children: Not on file   Years of education: Not on file   Highest education level: Not on file  Occupational History   Occupation: [place]    Employer: [employer]    Comment: cafeteria   Occupation: Volunteer    Comment: [employer]  Tobacco Use   Smoking status: Former    Packs/day: 0.05    Years: 6.00    Total pack years: 0.30    Types: Cigarettes    Quit date: 09/05/1999    Years since quitting: 22.7   Smokeless tobacco: Never  Substance and Sexual Activity   Alcohol use: No    Alcohol/week: 0.0 standard drinks of alcohol   Drug use: No   Sexual activity: Not Currently  Other Topics Concern   Not on file  Social History Narrative   Retired from working as a [employer].   Widowed. (since 1997). Not sexually active since.   Volunteers at [employer].   Social Determinants of Health   Financial Resource Strain: Medium Risk (05/22/2022)   Overall Financial Resource Strain (CARDIA)    Difficulty of Paying Living Expenses: Somewhat hard  Food Insecurity: No Food Insecurity (05/22/2022)   Hunger Vital  Sign    Worried About Running Out of Food in the Last Year: Never true    Ran Out of Food in the Last Year: Never true  Transportation Needs: No Transportation Needs (05/22/2022)   PRAPARE - Hydrologist (Medical): No    Lack of Transportation (Non-Medical): No  Physical Activity: Inactive (05/22/2022)   Exercise Vital Sign    Days of Exercise per Week: 0 days    Minutes of Exercise per Session: 0 min  Stress: No Stress Concern Present (05/22/2022)   [place]    Feeling of Stress : Not at all  Social Connections: Socially Isolated (05/22/2022)   Social Connection and Isolation Panel [NHANES]  Frequency of Communication with Friends and Family: Once a week    Frequency of Social Gatherings with Friends and Family: Once a week    Attends Religious Services: Never    Marine scientist or Organizations: No    Attends Archivist Meetings: Never    Marital Status: Widowed     Family History: The patient's family history includes Breast cancer in her mother; Diabetes in an other family member.  ROS:   Review of Systems  Constitutional:  Negative for chills and fever.  HENT:  Negative for nosebleeds and tinnitus.   Eyes:  Negative for blurred vision and pain.  Respiratory:  Negative for cough, hemoptysis, shortness of breath and stridor.   Cardiovascular:  Positive for leg swelling (intermittent). Negative for chest pain, palpitations, orthopnea, claudication and PND.  Gastrointestinal:  Negative for blood in stool, diarrhea, nausea and vomiting.  Genitourinary:  Negative for dysuria and hematuria.  Musculoskeletal:  Negative for falls.  Neurological:  Positive for weakness (from recent CVA). Negative for dizziness, loss of consciousness and headaches.  Psychiatric/Behavioral:  Negative for depression, hallucinations and substance abuse. The patient does not have insomnia.       EKGs/Labs/Other Studies Reviewed:    The following studies were reviewed today:  Cardiac Monitor 05/2022: Patch Wear Time:  16 days and 4 hours (2023-10-10T13:03:14-0400 to 2023-11-05T18:30:23-0500)   Monitor 1 Predominant rhythm was sinus rhythm Less than 1% ventricular and supraventricular ectopy burden No triggered episodes recorded   Monitor 2 Predominant rhythm was sinus rhythm 86 SVT runs occurred, all less than 45 seconds Less than 1% atrial fibrillation burden Less than 1% ventricular and supraventricular ectopy No triggered episodes recorded  TTE 03/2022: IMPRESSIONS     1. Left ventricular ejection fraction, by estimation, is 60 to 65%. The  left ventricle has normal function. The left ventricle has no regional  wall motion abnormalities. There is moderate left ventricular hypertrophy.  Left ventricular diastolic  parameters are indeterminate.   2. Right ventricular systolic function is normal. The right ventricular  size is normal. There is normal pulmonary artery systolic pressure.   3. Large pleural effusion.   4. The mitral valve is normal in structure. No evidence of mitral valve  regurgitation.   5. The aortic valve is tricuspid. Aortic valve regurgitation is not  visualized.   6. The inferior vena cava is normal in size with greater than 50%  respiratory variability, suggesting right atrial pressure of 3 mmHg.   7. Agitated saline contrast bubble study was negative, with no evidence  of any interatrial shunt.   Comparison(s): No significant change from prior study.   TTE 09/20/2020: IMPRESSIONS     1. Left ventricular ejection fraction, by estimation, is 55 to 60%. The  left ventricle has normal function. The left ventricle has no regional  wall motion abnormalities. There is moderate concentric left ventricular  hypertrophy. Left ventricular  diastolic parameters are consistent with Grade II diastolic dysfunction  (pseudonormalization). Elevated  left atrial pressure.   2. Right ventricular systolic function is normal. The right ventricular  size is normal. There is normal pulmonary artery systolic pressure.   3. The mitral valve is normal in structure. Mild to moderate mitral valve  regurgitation. No evidence of mitral stenosis.   4. The aortic valve is normal in structure. Aortic valve regurgitation is  not visualized. No aortic stenosis is present.   5. Aortic dilatation noted. There is mild dilatation of the ascending  aorta, measuring  40 mm.   6. The inferior vena cava is normal in size with greater than 50%  respiratory variability, suggesting right atrial pressure of 3 mmHg.   ZIO AT: Range was 107-149 bpm for a duration of 96.8 seconds on 10/25 at 5:47 AM.   EKG:  EKG has been personally reviewed. 06/09/2022: Sinus tachycardia, biatrial enlargement. Rate 102 bpm.  04/16/2022: NSR, 73 BPM, low voltage QRS, Septal infarct, age undermined (ED)   Recent Labs: 04/15/2022: TSH 1.669 04/23/2022: ALT 37 04/27/2022: BUN 107; Creatinine, Ser 5.55; Hemoglobin 8.6; Magnesium 2.9; Platelets 344; Potassium 4.4; Sodium 142   Recent Lipid Panel    Component Value Date/Time   CHOL 223 (H) 04/09/2022 0945   TRIG 58 04/09/2022 0945   HDL 78 04/09/2022 0945   CHOLHDL 2.9 04/09/2022 0945   CHOLHDL 2.3 05/22/2014 1635   VLDL 12 05/22/2014 1635   LDLCALC 135 (H) 04/09/2022 0945     Risk Assessment/Calculations:    CHA2DS2-VASc Score =   6 points; stroke risk was 9.7% per year in >90,000 patients (the [place] Atrial Fibrillation Cohort Study) and 13.6% risk of stroke/TIA/systemic embolism.       Physical Exam:    VS:  BP (!) 168/68   Pulse (!) 102   Ht '5\' 5"'$  (1.651 m)   Wt 164 lb (74.4 kg)   LMP 10/22/1978   SpO2 97%   BMI 27.29 kg/m     Wt Readings from Last 3 Encounters:  06/09/22 164 lb (74.4 kg)  05/22/22 162 lb (73.5 kg)  05/22/22 162 lb (73.5 kg)     GEN: Well nourished, well developed in no acute  distress HEENT: Normal NECK: No JVD; No carotid bruits LYMPHATICS: No lymphadenopathy CARDIAC: RRR, soft systolic murmur, rubs, gallops RESPIRATORY:  Clear to auscultation without rales, wheezing or rhonchi  ABDOMEN: Soft, non-tender, non-distended MUSCULOSKELETAL:  1+ pitting edema; No deformity; AV fistula in place on left arm  SKIN: Warm and dry NEUROLOGIC:  Alert and oriented x 3 PSYCHIATRIC:  Normal affect   ASSESSMENT:    1. Primary hypertension    PLAN:    In order of problems listed above:  New onset Afib  H/o cryptogenic stroke  New onset of Afib for ~90 seconds s/p cryptogenic stroke notified from ZIO monitor. Echo Left ventricular ejection fraction, is 55 to 60%. The left ventricle has normal function. The left ventricle has no regional wall motion abnormalities. There is moderate concentric left ventricular hypertrophy. Subclinical as patient remains asymptomatic. Patient does not meet criteria for lower dosage of Eliquis age 60, >60 kg. After discussion, she has not had a history of falls and is able to perform ADL in the house without difficulty (HAS-BLED 4 pts, [place] VASC 6 pts). Will continue with Eliquis '5mg'$  BID. No Afib in office, tachycardic on BB. Will also discontinue ASA.  - Eliquis 5 mg BID  - D/c ASA  - Continue with PT at home  - Continue home Lasix M/W/F - Amlodipine 5 mg, Carvedilol 6.25 mg BID   2.   HTN  Uncontrolled and likely main contribution to CKD. Nephrology is attempting to increase perfusion by minimizing antihypertensives for permissive HTN to attempt better kidney function, will defer to nephrology and hold off on changing regimen.  Consider increasing Amlodipine + Carvedilol doses for optimized control when indicated.  - Per nephrology  - Lasix 80 mg M/W/F - Amlodipine 5 mg  - Carvedilol 6.25 BID   3.   HLD  High intensity statin  Crestor 20 mg, recheck lipid panel nonfasting when appropriate. Last lipid panel 09/2023Total 223, LDL 135, HDL  78, tri 58.  - Continue Crestor 20 mg  - Recheck nonfasting when appropriate   4.   CKD stage 5.    AV fistula in place, no dialysis as of yet. Last GFR 13, per son. Sees [facility], nephrology to continue following.     Follow-up: 6 months  Medication Adjustments/Labs and Tests Ordered: Current medicines are reviewed at length with the patient today.  Concerns regarding medicines are outlined above.  Orders Placed This Encounter  Procedures   EKG 12-Lead   Meds ordered this encounter  Medications   apixaban (ELIQUIS) 5 MG TABS tablet    Sig: Take 1 tablet (5 mg total) by mouth 2 (two) times daily.    Dispense:  60 tablet    Refill:  2   Patient Instructions  Medication Instructions:   STOP TAKING ASPIRIN NOW  START TAKING ELIQUIS 5 MG BY MOUTH TWICE DAILY  *If you need a refill on your cardiac medications before your next appointment, please call your pharmacy*   Follow-Up: At [facility], you and your health needs are our priority.  As part of our continuing mission to provide you with exceptional heart care, we have created designated Provider Care Teams.  These Care Teams include your primary Cardiologist (physician) and Advanced Practice Providers (APPs -  Physician Assistants and Nurse Practitioners) who all work together to provide you with the care you need, when you need it.  We recommend signing up for the patient portal called "MyChart".  Sign up information is provided on this After Visit Summary.  MyChart is used to connect with patients for Virtual Visits (Telemedicine).  Patients are able to view lab/test results, encounter notes, upcoming appointments, etc.  Non-urgent messages can be sent to your provider as well.   To learn more about what you can do with MyChart, go to [email or web address].    Your next appointment:   6 month(s)  The format for your next appointment:   In Person  Provider:   DR. Johney Frame    Important Information  About Sugar          Signed, Erskine Emery, MD  06/09/2022 3:49 PM    [place]

## 2022-06-10 ENCOUNTER — Telehealth: Payer: Self-pay | Admitting: Cardiology

## 2022-06-10 NOTE — Telephone Encounter (Signed)
Gina Bailey calling too follow up on event that occurred where patient was found to be in AFIB and patient was not anticoagulated. She wanted to call to see if any adverse events had occurred. Patient was seen yesterday in clinic and started on anticoagulation.

## 2022-06-10 NOTE — Telephone Encounter (Signed)
  Gina Bailey calling to get an update regarding pt's reported result on 06/05/22. And since pt was seen yesterday they would like to get the plan of care given to the pt. She said the number she provided is her direct line

## 2022-06-17 ENCOUNTER — Encounter: Payer: Self-pay | Admitting: Podiatry

## 2022-06-17 ENCOUNTER — Ambulatory Visit: Payer: Medicare PPO | Admitting: Podiatry

## 2022-06-17 DIAGNOSIS — M79674 Pain in right toe(s): Secondary | ICD-10-CM | POA: Diagnosis not present

## 2022-06-17 DIAGNOSIS — E119 Type 2 diabetes mellitus without complications: Secondary | ICD-10-CM

## 2022-06-17 DIAGNOSIS — B351 Tinea unguium: Secondary | ICD-10-CM | POA: Diagnosis not present

## 2022-06-17 DIAGNOSIS — N1832 Chronic kidney disease, stage 3b: Secondary | ICD-10-CM

## 2022-06-17 DIAGNOSIS — M79675 Pain in left toe(s): Secondary | ICD-10-CM

## 2022-06-17 DIAGNOSIS — E1149 Type 2 diabetes mellitus with other diabetic neurological complication: Secondary | ICD-10-CM

## 2022-06-17 DIAGNOSIS — M2010 Hallux valgus (acquired), unspecified foot: Secondary | ICD-10-CM

## 2022-06-17 NOTE — Progress Notes (Signed)
This patient returns to my office for at risk foot care.  This patient requires this care by a professional since this patient will be at risk due to having type 2 diabetes and CKD.  This patient is unable to cut nails herself since the patient cannot reach her nails.These nails are painful walking and wearing shoes.  This patient presents for at risk foot care today.  General Appearance  Alert, conversant and in no acute stress.  Vascular  Dorsalis pedis are  weakly palpable  bilaterally.  Posterior tibial pulses are absent.Capillary return is within normal limits  bilaterally. Temperature is within normal limits  bilaterally.  Neurologic  Senn-Weinstein monofilament wire test diminished  bilaterally. Muscle power within normal limits bilaterally.  Nails Thick disfigured discolored nails with subungual debris  from hallux to fifth toes bilaterally. No evidence of bacterial infection or drainage bilaterally.  Orthopedic  No limitations of motion  feet .  No crepitus or effusions noted.  No bony pathology or digital deformities noted. Hammer toe second  B/L.  HAV  B/L.  Skin  normotropic skin with no porokeratosis noted bilaterally.  No signs of infections or ulcers noted.     Onychomycosis  Pain in right toes  Pain in left toes  Consent was obtained for treatment procedures.   Mechanical debridement of nails 1-5  bilaterally performed with a nail nipper.  Filed with dremel without incident.    Return office visit     3 months                Told patient to return for periodic foot care and evaluation due to potential at risk complications.   Corine Solorio DPM   

## 2022-06-25 ENCOUNTER — Encounter: Payer: Medicare PPO | Attending: Registered Nurse | Admitting: Physical Medicine & Rehabilitation

## 2022-06-25 ENCOUNTER — Encounter: Payer: Self-pay | Admitting: Physical Medicine & Rehabilitation

## 2022-06-25 VITALS — BP 181/76 | HR 82 | Ht 65.0 in | Wt 166.4 lb

## 2022-06-25 DIAGNOSIS — I639 Cerebral infarction, unspecified: Secondary | ICD-10-CM | POA: Insufficient documentation

## 2022-06-25 NOTE — Progress Notes (Signed)
Subjective:    Patient ID: Gina Bailey, female    DOB: Aug 06, 1945, 76 y.o.   MRN: [id number] Pain Right Now 76 y.o. female who presented to the emergency department on 04/15/2022 complaining of left facial droop, slurred speech.  MRI with findings of right posterior limb internal capsule as well as left tiny periventricular white matter lacunar infarcts.  No large vessel stenosis or occlusion on MR angiogram.  Neurology consulted.  Etiology likely secondary to chronic small vessel disease.  Aspirin 81 mg and clopidogrel 75 mg daily for 3 weeks with recommendations to continue aspirin alone.  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 CKD followed by [facility].  Admit date: 04/22/2022 Discharge date: 05/01/2022 HPI  The patient is here with her son, she is still receiving home health speech but is no longer getting PT or OT.  Prior to her stroke she did not require any assistive device to ambulate. She has had increasing problems with remembering medications and now her son must assist her with this.  She has been seen by primary care as well as neurology. The patient does have transportation to and from appointments.  Her son states that he could provide transportation for outpatient therapy if needed.  Pain Inventory Average Pain 0  My pain is  no pain  LOCATION OF PAIN  no pain  BOWEL Number of stools per week: 7 Oral laxative use No  Type of laxative . Enema or suppository use No  History of colostomy No  Incontinent No   BLADDER Pads In and out cath, frequency . Able to self cath  . Bladder incontinence No  Frequent urination No  Leakage with coughing No  Difficulty starting stream No  Incomplete bladder emptying No    Mobility use a walker ability to climb steps?  no do you drive?  no  Function retired I need assistance with the following:  bathing  Neuro/Psych trouble walking confusion  Prior Studies Any changes since last visit?   no  Physicians involved in your care Any changes since last visit?  no   Family History  Problem Relation Age of Onset   Diabetes Other        1st degree reative in 3 of her siblings   Breast cancer Mother        not sure of age   Social History   Socioeconomic History   Marital status: Widowed    Spouse name: Not on file   Number of children: Not on file   Years of education: Not on file   Highest education level: Not on file  Occupational History   Occupation: [place]    Employer: [employer]    Comment: cafeteria   Occupation: Volunteer    Comment: [employer]  Tobacco Use   Smoking status: Former    Packs/day: 0.05    Years: 6.00    Total pack years: 0.30    Types: Cigarettes    Quit date: 09/05/1999    Years since quitting: 22.8   Smokeless tobacco: Never  Substance and Sexual Activity   Alcohol use: No    Alcohol/week: 0.0 standard drinks of alcohol   Drug use: No   Sexual activity: Not Currently  Other Topics Concern   Not on file  Social History Narrative   Retired from working as a [employer].   Widowed. (since 1997). Not sexually active since.   Volunteers at [employer].  Social Determinants of Health   Financial Resource Strain: Medium Risk (05/22/2022)   Overall Financial Resource Strain (CARDIA)    Difficulty of Paying Living Expenses: Somewhat hard  Food Insecurity: No Food Insecurity (05/22/2022)   Hunger Vital Sign    Worried About Running Out of Food in the Last Year: Never true    Ran Out of Food in the Last Year: Never true  Transportation Needs: No Transportation Needs (05/22/2022)   PRAPARE - Hydrologist (Medical): No    Lack of Transportation (Non-Medical): No  Physical Activity: Inactive (05/22/2022)   Exercise Vital Sign    Days of Exercise per Week: 0 days    Minutes of Exercise per Session: 0 min  Stress: No Stress Concern Present (05/22/2022)   [place]    Feeling of Stress : Not at all  Social Connections: Socially Isolated (05/22/2022)   Social Connection and Isolation Panel [NHANES]    Frequency of Communication with Friends and Family: Once a week    Frequency of Social Gatherings with Friends and Family: Once a week    Attends Religious Services: Never    Marine scientist or Organizations: No    Attends Archivist Meetings: Never    Marital Status: Widowed   Past Surgical History:  Procedure Laterality Date   APPENDECTOMY     AV FISTULA PLACEMENT Left 03/10/2022   Procedure: LEFT ARM ARTERIOVENOUS (AV) FISTULA CREATION VERSUS GRAFT;  Surgeon: Waynetta Sandy, MD;  Location: [place];  Service: Vascular;  Laterality: Left;   COLON SURGERY  08/2006   Partial right colectomy and anastomosis with resection of villous adenoma.   HEMORRHOID SURGERY     hemorrhoidectomy   TONSILLECTOMY     TUBAL LIGATION     VENTRAL HERNIA REPAIR  08/2008   By Dr. Hulen Skains.   Past Medical History:  Diagnosis Date   Chronic kidney disease    Diabetes mellitus    Diet-controlled. Not on antiglycemic medications.   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  Based on the size, patient's age, status as a former smoker, and its discovery in a community setting, her probability of malignancy is 3% (low probability).  Based on this, I recommend follow-up with a CT scan around 05/2014, and if lesion is stable or smaller, no further follow-up is needed.  CT Chest 06/2015: Stable 46m pulmonary nodule. No   Ventral hernia    Incisional ventral hernia. S/P repair by Dr. 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 WHulen Skains (08/2006)   BP (!) 181/76   Pulse 82   Ht '5\' 5"'$  (1.651 m)   Wt 166 lb 6.4 oz (75.5 kg)   LMP 10/22/1978   SpO2 98%   BMI 27.69 kg/m   Opioid Risk Score:   Fall Risk Score:  `1  [facility]     05/22/2022   11:38 AM 05/22/2022  11:25 AM 05/12/2022    2:45 PM 04/09/2022   10:29 AM 07/03/2021   11:35 AM 03/26/2021    4:05 PM 01/23/2021    9:59 AM  Depression screen PHQ 2/9  Decreased Interest 0 0 0 0 0 0 0  Down, Depressed, Hopeless 0 0 0 0 0 0 0  PHQ - 2 Score 0 0 0 0 0 0 0  Altered sleeping 0  0  0  0  Tired, decreased energy 0  1  0  0  Change in appetite 0  1  0  0  Feeling bad or failure about yourself  0  0  0  0  Trouble concentrating 0  0  0  0  Moving slowly or fidgety/restless 0  0  0  0  Suicidal thoughts 0  0  0  0  PHQ-9 Score 0  2  0  0  Difficult doing work/chores Not difficult at all Not difficult at all Not difficult at all  Not difficult at all  Not difficult at all     Review of Systems  Psychiatric/Behavioral:  Positive for confusion.   All other systems reviewed and are negative.     Objective:   Physical Exam  No acute distress Mood and affect appropriate Extremities without edema Motor strength is 4/5 in the left deltoid bicep tricep grip 5/5 in the right deltoid bicep tricep grip 4+ left hip flexor knee extensor ankle dorsiflexor 5/5 in the right hip flexor knee extensor ankle dorsiflexion Sensation intact light touch bilateral upper limbs and lower limbs No evidence of tactile inattention or visual neglect Speech without evidence of dysarthria Tone is normal bilateral upper extremity Standing balance is fair She can ambulate with hand-held assistance for short distances she has difficulty with turns.  She can ambulate with a walker slowly independently.       Assessment & Plan:  #1.  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 Will make referral to outpatient PT OT speech.  She can continue home health speech until this is all lined up. Physical medicine rehab follow-up in 2 months

## 2022-07-06 DIAGNOSIS — E876 Hypokalemia: Secondary | ICD-10-CM | POA: Diagnosis not present

## 2022-07-06 DIAGNOSIS — N185 Chronic kidney disease, stage 5: Secondary | ICD-10-CM | POA: Diagnosis not present

## 2022-07-06 DIAGNOSIS — N2581 Secondary hyperparathyroidism of renal origin: Secondary | ICD-10-CM | POA: Diagnosis not present

## 2022-07-06 DIAGNOSIS — R809 Proteinuria, unspecified: Secondary | ICD-10-CM | POA: Diagnosis not present

## 2022-07-06 DIAGNOSIS — Q6102 Congenital multiple renal cysts: Secondary | ICD-10-CM | POA: Diagnosis not present

## 2022-07-06 DIAGNOSIS — E1122 Type 2 diabetes mellitus with diabetic chronic kidney disease: Secondary | ICD-10-CM | POA: Diagnosis not present

## 2022-07-06 DIAGNOSIS — I120 Hypertensive chronic kidney disease with stage 5 chronic kidney disease or end stage renal disease: Secondary | ICD-10-CM | POA: Diagnosis not present

## 2022-07-07 ENCOUNTER — Telehealth: Payer: Self-pay | Admitting: *Deleted

## 2022-07-07 NOTE — Telephone Encounter (Signed)
Call from [place] with [facility]. Stated pt's BP was 194/139 when she arrived then pt took her medication. Pt stated she has been taking her meds daily. She took pt's BP while taking to me and it's 192/99. Stated she has parameters for BP and has to report to pt's doctor.

## 2022-07-07 NOTE — Telephone Encounter (Signed)
[facility] called per Dr Jimmye Norman' request. Mesage left about pt's BP's on Dr Luis Abed assistant, Alana's self-identified vm.

## 2022-07-15 ENCOUNTER — Emergency Department (HOSPITAL_COMMUNITY)
Admission: EM | Admit: 2022-07-15 | Discharge: 2022-07-16 | Disposition: A | Discharge status: Home / Self Care | Payer: Medicare PPO | Attending: Emergency Medicine | Admitting: Emergency Medicine

## 2022-07-15 ENCOUNTER — Other Ambulatory Visit: Payer: Self-pay

## 2022-07-15 DIAGNOSIS — Z7901 Long term (current) use of anticoagulants: Secondary | ICD-10-CM | POA: Insufficient documentation

## 2022-07-15 DIAGNOSIS — Z79899 Other long term (current) drug therapy: Secondary | ICD-10-CM | POA: Insufficient documentation

## 2022-07-15 DIAGNOSIS — E1122 Type 2 diabetes mellitus with diabetic chronic kidney disease: Secondary | ICD-10-CM | POA: Diagnosis not present

## 2022-07-15 DIAGNOSIS — I129 Hypertensive chronic kidney disease with stage 1 through stage 4 chronic kidney disease, or unspecified chronic kidney disease: Secondary | ICD-10-CM | POA: Diagnosis not present

## 2022-07-15 DIAGNOSIS — I10 Essential (primary) hypertension: Secondary | ICD-10-CM | POA: Diagnosis not present

## 2022-07-15 DIAGNOSIS — R531 Weakness: Secondary | ICD-10-CM | POA: Diagnosis not present

## 2022-07-15 DIAGNOSIS — M25551 Pain in right hip: Secondary | ICD-10-CM | POA: Diagnosis not present

## 2022-07-15 DIAGNOSIS — Z8673 Personal history of transient ischemic attack (TIA), and cerebral infarction without residual deficits: Secondary | ICD-10-CM | POA: Diagnosis not present

## 2022-07-15 DIAGNOSIS — N189 Chronic kidney disease, unspecified: Secondary | ICD-10-CM | POA: Insufficient documentation

## 2022-07-15 DIAGNOSIS — R102 Pelvic and perineal pain: Secondary | ICD-10-CM | POA: Diagnosis not present

## 2022-07-15 DIAGNOSIS — M25572 Pain in left ankle and joints of left foot: Secondary | ICD-10-CM | POA: Diagnosis not present

## 2022-07-15 DIAGNOSIS — E876 Hypokalemia: Secondary | ICD-10-CM | POA: Diagnosis not present

## 2022-07-15 DIAGNOSIS — U071 COVID-19: Secondary | ICD-10-CM | POA: Diagnosis not present

## 2022-07-15 LAB — CBC WITH DIFFERENTIAL/PLATELET
Abs Immature Granulocytes: 0.03 10*3/uL (ref 0.00–0.07)
Basophils Absolute: 0 10*3/uL (ref 0.0–0.1)
Basophils Relative: 0 %
Eosinophils Absolute: 0.1 10*3/uL (ref 0.0–0.5)
Eosinophils Relative: 2 %
HCT: 35.3 % — ABNORMAL LOW (ref 36.0–46.0)
Hemoglobin: 10.7 g/dL — ABNORMAL LOW (ref 12.0–15.0)
Immature Granulocytes: 0 %
Lymphocytes Relative: 9 %
Lymphs Abs: 0.7 10*3/uL (ref 0.7–4.0)
MCH: 27 pg (ref 26.0–34.0)
MCHC: 30.3 g/dL (ref 30.0–36.0)
MCV: 89.1 fL (ref 80.0–100.0)
Monocytes Absolute: 0.9 10*3/uL (ref 0.1–1.0)
Monocytes Relative: 11 %
Neutro Abs: 6.5 10*3/uL (ref 1.7–7.7)
Neutrophils Relative %: 78 %
Platelets: 260 10*3/uL (ref 150–400)
RBC: 3.96 MIL/uL (ref 3.87–5.11)
RDW: 16.7 % — ABNORMAL HIGH (ref 11.5–15.5)
WBC: 8.2 10*3/uL (ref 4.0–10.5)
nRBC: 0 % (ref 0.0–0.2)

## 2022-07-15 LAB — BASIC METABOLIC PANEL
Anion gap: 9 (ref 5–15)
BUN: 48 mg/dL — ABNORMAL HIGH (ref 8–23)
CO2: 25 mmol/L (ref 22–32)
Calcium: 8.7 mg/dL — ABNORMAL LOW (ref 8.9–10.3)
Chloride: 109 mmol/L (ref 98–111)
Creatinine, Ser: 4.75 mg/dL — ABNORMAL HIGH (ref 0.44–1.00)
GFR, Estimated: 9 mL/min — ABNORMAL LOW (ref >60)
Glucose, Bld: 113 mg/dL — ABNORMAL HIGH (ref 70–99)
Potassium: 3.3 mmol/L — ABNORMAL LOW (ref 3.5–5.1)
Sodium: 143 mmol/L (ref 135–145)

## 2022-07-15 LAB — CBG MONITORING, ED
Glucose-Capillary: 107 mg/dL — ABNORMAL HIGH (ref 70–99)
Glucose-Capillary: 93 mg/dL (ref 70–99)

## 2022-07-15 NOTE — ED Triage Notes (Signed)
Pt comes via [facility] for generalized weakness, hx of stroke, with L sided residual weakness, pt has incontinence of urine and dysuria.

## 2022-07-15 NOTE — ED Provider Triage Note (Signed)
Emergency Medicine Provider Triage Evaluation Note  BRANTLEE HINDE , a 76 y.o. female  was evaluated in triage.  Pt brought by EMS with complaint of generalized weakness, dysuria, and incontinence of urine.  Hx significant for stroke with L sided residual weakness.  Denies fever, abdominal pain.   Review of Systems  Positive: As above Negative: As above  Physical Exam  BP (!) 173/85 (BP Location: Right Arm)   Pulse 91   Temp 100 F (37.8 C) (Oral)   Resp 18   LMP 10/22/1978   SpO2 96%  Gen:   Awake, no distress   Resp:  Normal effort  MSK:   Moves extremities without difficulty  Other:    Medical Decision Making  Medically screening exam initiated at 9:22 PM.  Appropriate orders placed.  KAEYA SCHIFFER was informed that the remainder of the evaluation will be completed by another provider, this initial triage assessment does not replace that evaluation, and the importance of remaining in the ED until their evaluation is complete.     Pat Kocher, [place] 07/15/22 2125

## 2022-07-16 ENCOUNTER — Emergency Department (HOSPITAL_COMMUNITY): Payer: Medicare PPO

## 2022-07-16 DIAGNOSIS — M25551 Pain in right hip: Secondary | ICD-10-CM | POA: Diagnosis not present

## 2022-07-16 DIAGNOSIS — R102 Pelvic and perineal pain: Secondary | ICD-10-CM | POA: Diagnosis not present

## 2022-07-16 LAB — URINALYSIS, ROUTINE W REFLEX MICROSCOPIC
Bilirubin Urine: NEGATIVE
Glucose, UA: 50 mg/dL — AB
Ketones, ur: 5 mg/dL — AB
Leukocytes,Ua: NEGATIVE
Nitrite: NEGATIVE
Protein, ur: >=300 mg/dL — AB
Specific Gravity, Urine: 1.013 (ref 1.005–1.030)
pH: 6 (ref 5.0–8.0)

## 2022-07-16 LAB — RESP PANEL BY RT-PCR (RSV, FLU A&B, COVID)  RVPGX2
Influenza A by PCR: NEGATIVE
Influenza B by PCR: NEGATIVE
Resp Syncytial Virus by PCR: NEGATIVE
SARS Coronavirus 2 by RT PCR: POSITIVE — AB

## 2022-07-16 MED ORDER — ACETAMINOPHEN 325 MG PO TABS
650.0000 mg | ORAL_TABLET | Freq: Once | ORAL | Status: AC
  Administered 2022-07-16: 650 mg via ORAL
  Filled 2022-07-16: qty 2

## 2022-07-16 MED ORDER — MOLNUPIRAVIR EUA 200MG CAPSULE: 4.0000 | ORAL_CAPSULE | Freq: Two times a day (BID) | ORAL | 0 refills | Status: AC

## 2022-07-16 MED ORDER — CARVEDILOL 3.125 MG PO TABS: 6.2500 mg | ORAL_TABLET | Freq: Two times a day (BID) | ORAL | Status: DC

## 2022-07-16 MED ORDER — POTASSIUM CHLORIDE CRYS ER 20 MEQ PO TBCR
20.0000 meq | EXTENDED_RELEASE_TABLET | Freq: Once | ORAL | Status: AC
  Administered 2022-07-16: 20 meq via ORAL
  Filled 2022-07-16: qty 1

## 2022-07-16 MED ORDER — AMLODIPINE BESYLATE 5 MG PO TABS
5.0000 mg | ORAL_TABLET | Freq: Once | ORAL | Status: AC
  Administered 2022-07-16: 5 mg via ORAL
  Filled 2022-07-16: qty 1

## 2022-07-16 MED ORDER — APIXABAN 5 MG PO TABS
5.0000 mg | ORAL_TABLET | Freq: Two times a day (BID) | ORAL | Status: DC
  Administered 2022-07-16: 5 mg via ORAL
  Filled 2022-07-16: qty 1

## 2022-07-16 NOTE — Discharge Instructions (Addendum)
Thank you for coming to [facility] Emergency Department. You were seen for generalized weakness. We did an exam, labs, and imaging, and these showed covid-19 virus infection.  We will prescribe the antiviral medicine molnupiravir which will hopefully decrease the severity and duration of your COVID symptoms.  Please take 800 mg by mouth twice per day for 5 days.  Please quarantine for 5 days from the start of your symptoms. Please follow up with your primary care provider within 1-2 weeks.  Do not hesitate to return to the ED or call 911 if you experience: -Worsening symptoms -Shortness of breath -Chest pain -Falls -Lightheadedness, passing out -Fevers/chills -Anything else that concerns you

## 2022-07-16 NOTE — ED Notes (Signed)
RN reviewed discharge instructions with pt. Pt verbalized understanding and had no further questions. VSS upon discharge.  

## 2022-07-16 NOTE — ED Provider Notes (Signed)
[facility] EMERGENCY DEPARTMENT Provider Note   CSN: [id number] Arrival date & time: 07/15/22  2055     History  Chief Complaint  Patient presents with   Weakness    Gina Bailey is a 76 y.o. female with h/o stroke with residual L-sided weakness, T2DM, CKD, HTN, cognitive impairment, gout, HLD, who presents with weakness.  Pt brought by EMS with complaint of generalized weakness, dysuria, and incontinence of urine for a couple of days.  Hx significant for stroke with L sided residual weakness. She complains of no new neurologic symptoms, asymmetric weakness/numbness/tingling, that is all at her baseline. She denies fever/chills, CP, SOB, cough, abdominal pain, flank pain, changes to bowel. Patient states that she was so weak she fell into the wall and has L hip tenderness without any head trauma or LOC. Has been ambulatory.   Weakness      Home Medications Prior to Admission medications   Medication Sig Start Date End Date Taking? Authorizing Provider  acetaminophen (TYLENOL) 325 MG tablet Take 1-2 tablets (325-650 mg total) by mouth every 4 (four) hours as needed for mild pain. 05/01/22   Setzer, Edman Circle, PA-C  amLODipine (NORVASC) 5 MG tablet Take 1 tablet (5 mg total) by mouth daily. 05/22/22   Angelica Pou, MD  apixaban (ELIQUIS) 5 MG TABS tablet Take 1 tablet (5 mg total) by mouth 2 (two) times daily. 06/09/22   Erskine Emery, MD  calcitRIOL (ROCALTROL) 0.25 MCG capsule Take 1 capsule (0.25 mcg total) by mouth 3 (three) times a week. 05/22/22   Angelica Pou, MD  carvedilol (COREG) 6.25 MG tablet Take 1 tablet (6.25 mg total) by mouth 2 (two) times daily with a meal. 05/22/22   Angelica Pou, MD  docusate sodium (COLACE) 100 MG capsule Take 100 mg by mouth daily.    [provider]  furosemide (LASIX) 80 MG tablet Take 1 tablet (80 mg total) by mouth 3 (three) times a week. M/W/F as needed for swellling per Dr. Royce Macadamia 05/22/22    Angelica Pou, MD  potassium chloride (MICRO-K) 10 MEQ CR capsule Take 10 mEq by mouth daily. 06/04/22   [provider]  rosuvastatin (CRESTOR) 20 MG tablet Take 1 tablet (20 mg total) by mouth daily. 05/22/22   Angelica Pou, MD      Allergies    Metformin and related, Pineapple, and Zestril [lisinopril]    Review of Systems   Review of Systems  Neurological:  Positive for weakness.   Review of systems Negative for f/c.  A 10 point review of systems was performed and is negative unless otherwise reported in HPI.  Physical Exam Updated Vital Signs BP (!) 174/78   Pulse 79   Temp 98.5 F (36.9 C) (Oral)   Resp 16   LMP 10/22/1978   SpO2 95%  Physical Exam General: Normal appearing female, lying in bed.  HEENT: PERRLA, EOMI, Sclera anicteric, MMM, trachea midline.  Cardiology: RRR, no murmurs/rubs/gallops. BL radial and DP pulses equal bilaterally.  Resp: Normal respiratory rate and effort. CTAB, no wheezes, rhonchi, crackles.  Abd: Soft, non-tender, non-distended. No rebound tenderness or guarding.  GU: Deferred. MSK: L sided hip tenderness to palpation without any signs of trauma or deformity. No peripheral edema or signs of trauma. Extremities without deformity or TTP. No cyanosis or clubbing. Skin: warm, dry. No rashes or lesions. Back: No CVA tenderness Neuro: A&Ox4, CNs II-XII grossly intact. Left-sided . Sensation grossly intact.  Psych:  Normal mood and affect.   ED Results / Procedures / Treatments   Labs (all labs ordered are listed, but only abnormal results are displayed) Labs Reviewed  RESP PANEL BY RT-PCR (RSV, FLU A&B, COVID)  RVPGX2 - Abnormal; Notable for the following components:      Result Value   SARS Coronavirus 2 by RT PCR POSITIVE (*)    All other components within normal limits  BASIC METABOLIC PANEL - Abnormal; Notable for the following components:   Potassium 3.3 (*)    Glucose, Bld 113 (*)    BUN 48 (*)    Creatinine,  Ser 4.75 (*)    Calcium 8.7 (*)    GFR, Estimated 9 (*)    All other components within normal limits  URINALYSIS, ROUTINE W REFLEX MICROSCOPIC - Abnormal; Notable for the following components:   APPearance HAZY (*)    Glucose, UA 50 (*)    Hgb urine dipstick SMALL (*)    Ketones, ur 5 (*)    Protein, ur >=300 (*)    Bacteria, UA RARE (*)    All other components within normal limits  CBC WITH DIFFERENTIAL/PLATELET - Abnormal; Notable for the following components:   Hemoglobin 10.7 (*)    HCT 35.3 (*)    RDW 16.7 (*)    All other components within normal limits  CBG MONITORING, ED - Abnormal; Notable for the following components:   Glucose-Capillary 107 (*)    All other components within normal limits  CBG MONITORING, ED    EKG EKG Interpretation  Date/Time:  Wednesday July 15 2022 21:44:17 EST Ventricular Rate:  92 PR Interval:  144 QRS Duration: 72 QT Interval:  380 QTC Calculation: 469 R Axis:   7 Text Interpretation: Sinus rhythm with Premature atrial complexes Biatrial enlargement Minimal voltage criteria for LVH, may be normal variant ( R in aVL ) Nonspecific T wave abnormality Abnormal ECG When compared with ECG of 15-Apr-2022 14:49, PREVIOUS ECG IS PRESENT Confirmed by Ronnald Nian, Adam ([id number]) on 07/16/2022 9:05:00 AM  Radiology  PXR: Negative   Procedures Procedures    Medications Ordered in ED Medications  potassium chloride SA (KLOR-CON M) CR tablet 20 mEq (20 mEq Oral Given 07/16/22 1624)  acetaminophen (TYLENOL) tablet 650 mg (650 mg Oral Given 07/16/22 1825)  amLODipine (NORVASC) tablet 5 mg (5 mg Oral Given 07/16/22 1825)    ED Course/ Medical Decision Making/ A&P                          Medical Decision Making Amount and/or Complexity of Data Reviewed Labs:  Decision-making details documented in ED Course. Radiology: ordered. Decision-making details documented in ED Course.  Risk OTC drugs. Prescription drug management.   This patient  presents to the ED for concern of urinary symptoms and generalized weakness, this involves an extensive number of treatment options, and is a complaint that carries with it a high risk of complications and morbidity.  I considered the following differential and admission for this acute, potentially life threatening condition.    MDM:    Highest c/f UTI or urosepsis given urinary symptoms/generalized weakness. Pt with T 100F and is not tachycardic but will evaluate with labs, UA, and urine culture. Also consider other cause of generalized weakness such as electrolyte derangement, renal injury, anemia, viral syndrome such as covid/flu/RSV, dehydration, hypo/hyperglycemia, ACS/arrhythmia though no CP reported. She has no new FNDs and exam is at her baseline, low c/f intracranial pathology  such as new CVA. Exam demonstrates L sided hip tenderness to palpation and PXR ordered.  Clinical Course as of 08/04/22 2300  Thu Jul 16, 2022  1606 Creatinine(!): 4.75 Less than patient's baseline [HN]  1606 Hemoglobin(!): 10.7 Slightly greater than baseline [HN]  1607 WBC: 8.2 No leukocytosis [HN]  1608 Potassium(!): 3.3 Repleting [HN]  1836 DG Pelvis 1-2 Views FINDINGS: There is no evidence of pelvic fracture or diastasis. No pelvic bone lesions are seen.  IMPRESSION: Negative.   [HN]  2050 SARS Coronavirus 2 by RT PCR(!): POSITIVE [HN]  2053 RBC / HPF: 11-20 [HN]  2054 Protein(!): >=300 [HN]  2054 Proteinuria >300. Has had proteinuria 100 before. Also w/ microscopic hematuria but with no elevation in her renal function, Cr is at her baseline, so no c/f hypertensive emergency w/ renal injury.  No UTI.  [HN]  2054 BP(!): 185/116 Patient is hypertensive but missed her medications this morning and this evening d/t waiting in the waiting room. She has no CP or SOB, no headache. No FNDs. Cr. At baseline. Low c/f hypertensive emergency. Treated patient with her home amlodipine, carvedilol, and eliquis as  well.  [HN]  2055 Potassium(!): 3.3 Was repleted [HN]    Clinical Course User Index [HN] Audley Hose, MD     Labs: I Ordered, and personally interpreted labs.  The pertinent results include:  those listed above  Imaging Studies ordered: I ordered imaging studies including PXR, CXR I independently visualized and interpreted imaging. I agree with the radiologist interpretation  Additional history obtained from chart review  Cardiac Monitoring: The patient was maintained on a cardiac monitor.  I personally viewed and interpreted the cardiac monitored which showed an underlying rhythm of: NSR  Reevaluation: After the interventions noted above, I reevaluated the patient and found that they have :stayed the same  Social Determinants of Health: Patient lives independently with [facility]  Disposition:  Patient is + Covid. Mildly hypokalemic and repleted. Patient's blood pressure decreased with home medications and instructed to follow up with PCP about this. Patient prescribed molnupiravir and discharged with DC instructions, return precautions, instructions to f/u with PCP within 1-2 weeks.  Co morbidities that complicate the patient evaluation  Past Medical History:  Diagnosis Date   Chronic kidney disease    Diabetes mellitus    Diet-controlled. Not on antiglycemic medications.   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  Based on the size, patient's age, status as a former smoker, and its discovery in a community setting, her probability of malignancy is 3% (low probability).  Based on this, I recommend follow-up with a CT scan around 05/2014, and if lesion is stable or smaller, no further follow-up is  needed.  CT Chest 06/2015: Stable 8m pulmonary nodule. No   Ventral hernia    Incisional ventral hernia. S/P repair by Dr. 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 WHulen Skains (08/2006)     Medicines Meds ordered this encounter  Medications   potassium chloride SA (KLOR-CON  M) CR tablet 20 mEq   acetaminophen (TYLENOL) tablet 650 mg   DISCONTD: carvedilol (COREG) tablet 6.25 mg   amLODipine (NORVASC) tablet 5 mg   DISCONTD: apixaban (ELIQUIS) tablet 5 mg   molnupiravir EUA (LAGEVRIO) 200 mg CAPS capsule    Sig: Take 4 capsules (800 mg total) by mouth 2 (two) times daily for 5 days.    Dispense:  40 capsule    Refill:  0    I have reviewed the patients home medicines and have made adjustments as needed  Problem List / ED Course: Problem List Items Addressed This Visit   None Visit Diagnoses     Weakness    -  Primary   COVID-19                    This note was created using dictation software, which may contain spelling or grammatical errors.    Audley Hose, MD 08/04/22 [phone]

## 2022-07-16 NOTE — Addendum Note (Signed)
Addended by: Charlett Blake on: 07/16/2022 10:55 AM   Modules accepted: Orders

## 2022-07-17 ENCOUNTER — Telehealth: Payer: Self-pay

## 2022-07-17 NOTE — Patient Outreach (Signed)
  Care Coordination [facility] Note Transition Care Management Follow-up Telephone Call Date of discharge and from where: [facility] 07/16/22 How have you been since you were released from the hospital? Per patients son, [name], patient is doing better.  Patient is sleeping a lot". Any questions or concerns? No  Items Reviewed: Did the pt receive and understand the discharge instructions provided? Yes  Medications obtained and verified? Yes  Other? No  Any new allergies since your discharge? No  Dietary orders reviewed? No Do you have support at home? Yes   Home Care and Equipment/Supplies: Were home health services ordered? not applicable If so, what is the name of the agency? N/a  Has the agency set up a time to come to the patient's home? not applicable Were any new equipment or medical supplies ordered?  No What is the name of the medical supply agency? N/a Were you able to get the supplies/equipment? not applicable Do you have any questions related to the use of the equipment or supplies? No  Functional Questionnaire: (I = Independent and D = Dependent) ADLs: D  Bathing/Dressing- D  Meal Prep- D  Eating- D  Maintaining continence- D  Transferring/Ambulation- D  Managing Meds- D  Follow up appointments reviewed:  PCP Hospital f/u appt confirmed? No   Specialist Hospital f/u appt confirmed? No   Are transportation arrangements needed? No  If their condition worsens, is the pt aware to call PCP or go to the Emergency Dept.? Yes Was the patient provided with contact information for the PCP's office or ED? Yes Was to pt encouraged to call back with questions or concerns? Yes  SDOH assessments and interventions completed:   Yes SDOH Interventions Today    Flowsheet Row Most Recent Value  SDOH Interventions   Food Insecurity Interventions Intervention Not Indicated  Transportation Interventions Intervention Not Indicated  Utilities Interventions Intervention Not  Indicated       Care Coordination Interventions:  PCP follow up appointment requested   Encounter Outcome:  Pt. Visit Completed

## 2022-07-30 ENCOUNTER — Ambulatory Visit: Payer: Medicare PPO

## 2022-07-30 ENCOUNTER — Encounter: Payer: Self-pay | Admitting: Physical Therapy

## 2022-07-30 ENCOUNTER — Ambulatory Visit: Payer: Medicare PPO | Attending: Physical Medicine & Rehabilitation | Admitting: Physical Therapy

## 2022-07-30 VITALS — BP 153/77 | HR 65

## 2022-07-30 DIAGNOSIS — R41842 Visuospatial deficit: Secondary | ICD-10-CM | POA: Insufficient documentation

## 2022-07-30 DIAGNOSIS — R2681 Unsteadiness on feet: Secondary | ICD-10-CM | POA: Insufficient documentation

## 2022-07-30 DIAGNOSIS — M6281 Muscle weakness (generalized): Secondary | ICD-10-CM | POA: Diagnosis not present

## 2022-07-30 DIAGNOSIS — R2689 Other abnormalities of gait and mobility: Secondary | ICD-10-CM

## 2022-07-30 DIAGNOSIS — R29818 Other symptoms and signs involving the nervous system: Secondary | ICD-10-CM | POA: Insufficient documentation

## 2022-07-30 DIAGNOSIS — R41840 Attention and concentration deficit: Secondary | ICD-10-CM | POA: Insufficient documentation

## 2022-07-30 DIAGNOSIS — I639 Cerebral infarction, unspecified: Secondary | ICD-10-CM | POA: Insufficient documentation

## 2022-07-30 DIAGNOSIS — R471 Dysarthria and anarthria: Secondary | ICD-10-CM

## 2022-07-30 DIAGNOSIS — R41841 Cognitive communication deficit: Secondary | ICD-10-CM | POA: Diagnosis not present

## 2022-07-30 NOTE — Therapy (Signed)
OUTPATIENT PHYSICAL THERAPY NEURO EVALUATION   Patient Name: Gina Bailey MRN: [id number] DOB:1946/04/02, 77 y.o., female Today's Date: 07/30/2022   PCP: Angelica Pou, MD  REFERRING PROVIDER:  Charlett Blake, MD  END OF SESSION:  PT End of Session - 07/30/22 1150     Visit Number 1    Number of Visits 9    Date for PT Re-Evaluation 09/28/22   due to potential delay in scheduling   Authorization Type [employer] - needs auth    PT Start Time 1018    PT Stop Time 1100    PT Time Calculation (min) 42 min    Equipment Utilized During Treatment Gait belt    Activity Tolerance Patient tolerated treatment well    Behavior During Therapy WFL for tasks assessed/performed             Past Medical History:  Diagnosis Date   Chronic kidney disease    Diabetes mellitus    Diet-controlled. Not on antiglycemic medications.   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  Based on the size, patient's age, status as a former smoker, and its discovery in a community setting, her probability of malignancy is 3% (low probability).  Based on this, I recommend follow-up with a CT scan around 05/2014, and if lesion is stable or smaller, no further follow-up is needed.  CT Chest 06/2015: Stable 29m pulmonary nodule. No   Ventral hernia    Incisional ventral hernia. S/P repair by Dr. 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 WHulen Skains (08/2006)   Past Surgical History:  Procedure Laterality Date   APPENDECTOMY     AV FISTULA PLACEMENT Left 03/10/2022    Procedure: LEFT ARM ARTERIOVENOUS (AV) FISTULA CREATION VERSUS GRAFT;  Surgeon: CWaynetta Sandy MD;  [place] [facility]  Service: Vascular;  Laterality: Left;   COLON SURGERY  08/2006   Partial right colectomy and anastomosis with resection of villous adenoma.   HEMORRHOID SURGERY     hemorrhoidectomy   TONSILLECTOMY     TUBAL LIGATION     VENTRAL HERNIA REPAIR  08/2008   By Dr. 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   Cataracts, bilateral 08/05/2021   CKD (chronic kidney disease) stage 5, GFR less than 15 ml/min ([place] 04/08/2021   Pancreas cysts, incidental finding, likely benign, f/u imaging in 2024 can be considered 08/26/2020   Preventative health care 09/04/2010   Diabetes mellitus type 2, diet-controlled ([place]  04/18/2009   Hyperlipidemia associated with type 2 diabetes mellitus ([place]) 06/24/2007   VILLOUS ADENOMA, COLON, HX OF 09/09/2006   Hypertension 06/04/2006    ONSET DATE:  07/16/2022  REFERRING DIAG: I63.9 (ICD-10-CM) - Subcortical infarction ([place])  THERAPY DIAG:  Unsteadiness on feet  Muscle weakness (generalized)  Other abnormalities of gait and mobility  Rationale for Evaluation and Treatment: Rehabilitation  SUBJECTIVE:                                                                                                                                                                                             SUBJECTIVE  STATEMENT: Got inpatient rehab in the hospital and got home health therapies for approx. 1 month. Reports all she did was walking and getting up and down from the chair. Has a tub bench and has only used twice (when someone came over to help), they did not work on this in therapy. Is currently now just bathing at the sink. Only sometimes uses the RW, and sometimes does not use the RW because it gets in her way. Has not had any falls. Has some light weight and something she uses for gripping exercises for home. Can use her L arm more. Reports sometimes it is hard to move her L foot. Prior to her CVA, was not using any AD to help her walk.   Pt accompanied by:  son [name]  PERTINENT HISTORY: Pt presented to the emergency department on 04/15/2022 complaining of left facial droop, slurred speech.  MRI with findings of right posterior limb internal capsule as well as left tiny periventricular white matter lacunar infarcts.  No large vessel stenosis or occlusion on MR angiogram. Etiology likely secondary to chronic small vessel disease. Pt with admission to [facility] inpatient rehab center from 10/4-10/17/23.  Prior to her stroke she did not require any assistive device to ambulate.   PMH significant for T2DM with neuropathy, poorly controlled HTN, ESRD with AVF LUE, HLD, former smoker,  diastolic HF.   PAIN:  Are you having pain? No  Vitals:   07/30/22 1032  BP: (!) 153/77  Pulse: 65    PRECAUTIONS: Fall  WEIGHT BEARING RESTRICTIONS: No  FALLS: Has patient fallen in last 6 months? No  LIVING ENVIRONMENT: Lives with: lives with their son (prior to CVA was also living with son) Lives in: House/apartment Stairs: Yes: External: 1 steps; none Has following equipment at home: Gilford Rile - 2 wheeled, Shower bench, Grab bars, and also has a bed rail   Needs help with bathing. Does not do any household cleaning or laundry.   PLOF: Independent  PATIENT GOALS:  Be more independent   OBJECTIVE:    DIAGNOSTIC FINDINGS: On 04/27/22: IMPRESSION: 1. 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 2. Slightly increased size of recent infarct in the posterior limb of the right internal capsule. 3. Extensive chronic small vessel ischemic disease. 4. Motion degraded head MRA without evidence of a large vessel occlusion or significant proximal stenosis.  COGNITION: Overall cognitive status: Impaired   SENSATION: Light touch: WFL Proprioception: WFL at bilat ankles   COORDINATION: Heel to shin: more difficulty performing with LLE    MUSCLE TONE: LLE: Hypertonic   POSTURE: rounded shoulders, forward head, and weight shift left Trunk lean to L in sitting    LOWER EXTREMITY MMT:    MMT Right Eval Left Eval  Hip flexion 4+ 4  Hip extension    Hip abduction 5 4+  Hip adduction 5 5  Hip internal rotation    Hip external rotation    Knee flexion 5 4+  Knee extension 5 5  Ankle dorsiflexion 5 4  Ankle plantarflexion    Ankle inversion    Ankle eversion    (Blank rows = not tested)  All tested in sitting   BED MOBILITY:  Pt has a bed rail, pt's son reports she does have trouble getting up due to weakness.   TRANSFERS: Assistive device utilized: None  Sit to stand: SBA Stand to sit: SBA Performs with no UE support   GAIT: Gait pattern: step through pattern, decreased step length- Right, decreased stance time- Left, decreased stride length, decreased hip/knee flexion- Left, and decreased ankle dorsiflexion- Left Trunk lean to L Distance walked: Clinic distances Assistive device utilized: Environmental consultant - 2 wheeled Level of assistance: SBA Comments: Pt with L inattention   FUNCTIONAL TESTS:  5 times sit to stand: 26.53 seconds no UE support  10 meter walk test: 34.46 seconds with RW = .95 ft/sec    [facility] PT Assessment - 07/30/22 1049       Standardized Balance Assessment   Standardized Balance Assessment Berg Balance Test;Timed Up and Go Test       Berg Balance Test   Sit to Stand Able to stand without using hands and stabilize independently    Standing Unsupported Able to stand safely 2 minutes    Sitting with Back Unsupported but Feet Supported on Floor or Stool Able to sit safely and securely 2 minutes    Stand to Sit Sits safely with minimal use of hands    Transfers Able to transfer safely, minor use of hands    Standing Unsupported with Eyes Closed Able to stand 10 seconds safely    Standing Unsupported with Feet Together Able to place feet together independently and stand 1 minute safely    From Standing, Reach Forward with Outstretched Arm Can reach confidently >25 cm (10")    From Standing Position, Pick up Object from Floor Able to pick up shoe, needs supervision    From Standing Position, Turn to Look Behind Over each Shoulder Looks behind from both sides and weight shifts well    Turn 360 Degrees Needs close supervision or verbal cueing   12.5 seconds to L, 12.5 seconds to R   Standing Unsupported, Alternately Place Feet on Step/Stool Able to complete >2 steps/needs minimal assist    Standing Unsupported, One Foot in Front Able to plae foot ahead of the other independently and hold 30 seconds    Standing on One Leg Tries to lift leg/unable to hold 3 seconds  but remains standing independently   standing on RLE, unable to lift LLE > 1 second   Total Score 45    Berg comment: 45/56 = Significant Fall Risk      Timed Up and Go Test   Normal TUG (seconds) 28.44   with RW             PATIENT SURVEYS:  FOTO Stroke Lower Extremity: 23  TODAY'S TREATMENT:                                                                                                                              N/A during eval    PATIENT EDUCATION: Education details: Clinical findings, POC, use of RW at all times  Person educated: Patient and pt's son Education method: Explanation Education comprehension: verbalized understanding  HOME EXERCISE  PROGRAM: Will provide at next session   GOALS: Goals reviewed with patient? Yes  SHORT TERM GOALS: ALL STGS = LTGS DUE TO POC  LONG TERM GOALS: Target date: 09/24/2022  Pt will be independent with final HEP for strength, gait, balance in order to build upon functional gains made in therapy. Baseline:  Goal status: INITIAL  2.  Pt will improve BERG to at least a 49/56 in order to demo decr fall risk  Baseline: 45/56 Goal status: INITIAL  3.  Pt will improve 5x sit<>stand to less than or equal to 19 sec to demonstrate improved functional strength and transfer efficiency.  Baseline: 26.53 seconds no UE support  Goal status: INITIAL  4.  Pt will improve TUG time to 20 seconds or less with LRAD in order to demo decrease fall risk. Baseline: 28.44 seconds with RW Goal status: INITIAL  5.  Pt will improve gait speed with LRAD to at least 1.8 ft/sec in order to demo improved community mobility and decr fall risk.  Baseline: 34.46 seconds with RW = .95 ft/sec Goal status: INITIAL    ASSESSMENT:  CLINICAL IMPRESSION: Patient is a 77 year old female referred to Neuro OPPT for CVA (right posterior limb internal capsule as well as left tiny periventricular white matter lacunar infarcts).  Pt received inpatient rehab and was discharged on 05/05/22 and then received [facility] services. Prior to CVA, pt was independent and did not use an AD for mobility. Pt currently now using a RW or is furniture walking at home. The following deficits were present during the exam: cognitive impairments, decr strength, impaired balance, gait abnormalities, L inattention, postural abnormalities. Based on BERG, 5x sit <> stand, TUG, and gait speed with RW, pt is an incr risk for falls. Pt would benefit from skilled PT to address these impairments and functional limitations to maximize functional mobility independence and decr fall risk.    OBJECTIVE IMPAIRMENTS: Abnormal gait, decreased activity tolerance, decreased  balance, decreased cognition, decreased coordination, decreased mobility, decreased strength, decreased safety awareness, hypomobility, impaired tone, impaired vision/preception, and postural dysfunction.   ACTIVITY LIMITATIONS: carrying, lifting, bending,  squatting, stairs, transfers, bed mobility, bathing, dressing, and locomotion level  PARTICIPATION LIMITATIONS: cleaning, laundry, driving, shopping, community activity, and yard work  PERSONAL FACTORS: Age, Behavior pattern, Past/current experiences, Time since onset of injury/illness/exacerbation, and 3+ comorbidities:  T2DM with neuropathy, poorly controlled HTN, ESRD with AVF LUE, HLD, former smoker,  diastolic HF.  are also affecting patient's functional outcome.   REHAB POTENTIAL: Good  CLINICAL DECISION MAKING: Evolving/moderate complexity  EVALUATION COMPLEXITY: Moderate  PLAN:  PT FREQUENCY: 2x/week  PT DURATION: 8 weeks  PLANNED INTERVENTIONS: Therapeutic exercises, Therapeutic activity, Neuromuscular re-education, Balance training, Gait training, Patient/Family education, Self Care, Stair training, Vestibular training, Visual/preceptual remediation/compensation, Orthotic/Fit training, DME instructions, and Re-evaluation  PLAN FOR NEXT SESSION: Look at gait with no AD, initiate HEP for standing balance, functional strength. Monitor BP   Arliss Journey, PT, DPT  07/30/2022, 12:50 PM

## 2022-07-30 NOTE — Therapy (Signed)
OUTPATIENT SPEECH LANGUAGE PATHOLOGY EVALUATION   Patient Name: Gina Bailey MRN: [id number] DOB:[birth year], 77 y.o., female Today's Date: 07/30/2022  PCP: Angelica Pou MD REFERRING PROVIDER: Charlett Blake, MD  END OF SESSION:  End of Session - 07/30/22 0930     Visit Number 1    Number of Visits 13    Date for SLP Re-Evaluation 09/10/22    Authorization Type [employer] - form submitted    SLP Start Time 0930    SLP Stop Time  1015    SLP Time Calculation (min) 45 min    Activity Tolerance Patient tolerated treatment well             Past Medical History:  Diagnosis Date   Chronic kidney disease    Diabetes mellitus    Diet-controlled. Not on antiglycemic medications.   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  Based on the size, patient's age, status as a former smoker, and its discovery in a community setting, her probability of malignancy is 3% (low probability).  Based on this, I recommend follow-up with a CT scan around 05/2014, and if lesion is stable or smaller, no further follow-up is needed.  CT Chest 06/2015: Stable 68m pulmonary nodule. No   Ventral hernia    Incisional ventral hernia. S/P repair by Dr. 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 WHulen Skains (08/2006)   Past Surgical History:  Procedure Laterality Date   APPENDECTOMY     AV FISTULA PLACEMENT Left 03/10/2022   Procedure: LEFT ARM ARTERIOVENOUS (AV) FISTULA CREATION VERSUS GRAFT;  Surgeon: CWaynetta Sandy MD;  [place] [facility]  Service: Vascular;   Laterality: Left;   COLON SURGERY  08/2006   Partial right colectomy and anastomosis with resection of villous adenoma.   HEMORRHOID SURGERY     hemorrhoidectomy   TONSILLECTOMY     TUBAL LIGATION     VENTRAL HERNIA REPAIR  08/2008   By Dr. 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   Cataracts, bilateral 08/05/2021   CKD (chronic kidney disease) stage 5, GFR less than 15 ml/min ([place] 04/08/2021   Pancreas cysts, incidental finding, likely benign, f/u imaging in 2024 can be considered 08/26/2020   Preventative health care 09/04/2010   Diabetes mellitus type 2, diet-controlled ([place] 04/18/2009   Hyperlipidemia associated with type 2 diabetes mellitus ([place] 06/24/2007   VILLOUS ADENOMA, COLON, HX OF 09/09/2006   Hypertension 06/04/2006    ONSET DATE:  07/16/2022 (referral date)   REFERRING DIAG:  I63.9 (ICD-10-CM) - Subcortical infarction  THERAPY DIAG:  Cognitive communication deficit  Dysarthria and anarthria  Rationale for Evaluation and Treatment: Rehabilitation  SUBJECTIVE:   SUBJECTIVE STATEMENT: "I'm in too big of a hurry"  Pt accompanied by: family member  PERTINENT HISTORY: PMHx of recent right CVA (acute infarct in the right posterior limb of the internal capsule and posteroinferior right lentiform nucleus. Additional acute infarct in the occipital lobe periventricular white matter posterior to the left occipital horn) with admission to  [facility] inpatient rehab center from 10/4-10/17/23. PMH: T2DM with neuropathy, poorly controlled HTN, ESRD with AVF LUE, HLD, former smoker, MR, diastolic HF.   PAIN: Are you having pain? No  FALLS: Has patient fallen in last 6 months?  No  LIVING ENVIRONMENT: Lives with: lives with their son Lives in: House/apartment  PLOF:  Level of assistance: Independent with ADLs, Independent with IADLs Employment: Retired  PATIENT GOALS: "I don't know" - patient; "the thinking part of it" -son    OBJECTIVE:   COGNITION: Overall cognitive status: Impaired with History of cognitive impairments at prior baseline Areas of impairment:  Attention: Impaired: Alternating, Divided Memory: Impaired: Working Industrial/product designer term Awareness: Impaired: Intellectual Executive function: Impaired: Impulse control and Slow processing Behavior: Impulsive Functional deficits: Limited awareness of cognitive deficits with son often prompting patient and providing/correcting answers related to functional deficits. Pt experiencing difficulty with orientation, recall to use walker for safe ambulation, and recalling location of bathroom items.    AUDITORY COMPREHENSION: Overall auditory comprehension: Appears intact but mildly impacted by recall and attention   READING COMPREHENSION: Not tested   EXPRESSION: verbal  MOTOR SPEECH: Overall motor speech: impaired Level of impairment: Conversation Respiration: thoracic breathing and clavicular breathing Phonation: low vocal intensity Resonance: WFL Articulation: Impaired: conversation Intelligibility: Intelligibility reduced Motor planning: Appears intact Effective technique: slow rate and over articulate  ORAL MOTOR EXAMINATION: Overall status: Impaired:   Labial: Bilateral (ROM and Coordination)  RECOMMENDATIONS FROM OBJECTIVE SWALLOW STUDY (MBSS/FEES):  05/25/22 MBSS Pt presents with functional oropharyngeal swallow with adequate mastication and bolus control,  complete pharyngeal stripping with no residue post-swallow; adequate laryngeal-vestibule closure with occasional intermittent penetration of thin liquids (PAS score of 2, considered to be [facility]), and no aspiration. She swallowed a 13 mm barium pill with thin liquids without incident. Pt and her son [name] viewed the MBS in real time and we discussed results/recs. Continue regular solids, thin liquids per Ms. Bagnall's preferences.   Pt and son denied any difficulty swallowing at this time.  STANDARDIZED ASSESSMENTS: Informal assessment due to cognitive deficits - delayed processing time, reduced recall of instructions, reduced attention   PATIENT REPORTED OUTCOME MEASURES (PROM): Cognitive Function: 30 (pt) & 10 (son) - indicating reduced patient awareness of deficits   TODAY'S TREATMENT:  07-30-22: Conducted motivational interviewing to determine personally relevant goals. Pt and son verbalized understanding and agreement with SLP recommendations and POC.   PATIENT EDUCATION: Education details: see above Person educated: Patient and Child(ren) Education method: Customer service manager Education comprehension: verbalized understanding, returned demonstration, and needs further education   GOALS: Goals reviewed with patient? Yes  SHORT TERM GOALS: Target date: 08/27/2022  Pt will recall to use walker with memory compensations given occasional min A over 2 sessions  Baseline: Goal status: INITIAL  2.  Pt will accurately find bathroom/bathroom items with use of memory compensations given occasional min A for 2/2 opportunities Baseline:  Goal status: INITIAL  3.  Pt will demonstrate increased orientation with use of memory aids given occasional min A over 2 sessions  Baseline:  Goal status: INITIAL  4.  Pt will carryover dysarthria compensations  in simple conversation over 10 minutes with occasional min A over 2 sessions  Baseline:  Goal status: INITIAL   LONG TERM GOALS: Target date: 09/10/2022  Family with report reduced need for verbal prompting to utilize walker at home over 1 week  Baseline:  Goal status: INITIAL  2.  Pt will exhibit increased recall and attention to bathroom location/items with use of visual aids with reduced need for verbal prompting over 1 week  Baseline:  Goal status: INITIAL  3.  Pt will carryover dysarthria compensations in simple conversation over 20 minutes with rare min A  Baseline:  Goal status: INITIAL   ASSESSMENT:  CLINICAL IMPRESSION: Patient is a 77 y.o. female who was seen today for subcortical infarction in Fall 2023. Prior cognitive changes reported prior to hospitalization requiring increased assistance with iADLs (medications). Son endorsed pt now requiring increased cues and prompts to recall to use walker, locate bathroom items, and follow daily routine. Cognitive impairment contributing to reduced safety awareness and awareness of deficits. Mild dysarthria exhibited at conversation level, in which pt benefited from slow rate and over-articulation to optimize speech intelligibility on occasion. Pt and son denied dysphagia at this time. Given change in baseline, pt would benefit from skilled ST intervention to address cognitive communication skills to maximize safety and functional independence.   OBJECTIVE IMPAIRMENTS: include memory, awareness, executive functioning, and dysarthria. These impairments are limiting patient from household responsibilities and effectively communicating at home and in community. Factors affecting potential to achieve goals and functional outcome are previous level of function. Patient will benefit from skilled SLP services to address above impairments and improve overall function.  REHAB POTENTIAL: Good  PLAN:  SLP FREQUENCY: 2x/week  SLP DURATION: 6  weeks  PLANNED INTERVENTIONS: Environmental controls, Cueing hierachy, Cognitive reorganization, Internal/external aids, Oral motor exercises, Functional tasks, Multimodal communication approach, SLP instruction and feedback, Compensatory strategies, and Patient/family education    KELLIANN PENDERGRAPH, CCC-SLP 07/30/2022, 12:59 PM

## 2022-07-30 NOTE — Therapy (Deleted)
OUTPATIENT SPEECH LANGUAGE PATHOLOGY EVALUATION   Patient Name: Gina Bailey MRN: [id number] DOB:Jun 05, 1946, 77 y.o., female Today's Date: 07/30/2022  PCP: Angelica Pou MD REFERRING PROVIDER: Charlett Blake, MD  END OF SESSION:   Past Medical History:  Diagnosis Date   Chronic kidney disease    Diabetes mellitus    Diet-controlled. Not on antiglycemic medications.   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  Based on the size, patient's age, status as a former smoker, and its discovery in a community setting, her probability of malignancy is 3% (low probability).  Based on this, I recommend follow-up with a CT scan around 05/2014, and if lesion is stable or smaller, no further follow-up is needed.  CT Chest 06/2015: Stable 7m pulmonary nodule. No   Ventral hernia    Incisional ventral hernia. S/P repair by Dr. 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 WHulen Skains (08/2006)   Past Surgical History:  Procedure Laterality Date   APPENDECTOMY     AV FISTULA PLACEMENT Left 03/10/2022   Procedure: LEFT ARM ARTERIOVENOUS (AV) FISTULA CREATION VERSUS GRAFT;  Surgeon: CWaynetta Sandy MD;  [place] [facility]  Service: Vascular;  Laterality: Left;   COLON SURGERY  08/2006   Partial right colectomy and anastomosis with resection of villous adenoma.   HEMORRHOID SURGERY     hemorrhoidectomy   TONSILLECTOMY     TUBAL LIGATION     VENTRAL HERNIA REPAIR  08/2008   By Dr. WHulen Skains   Patient Active Problem List   Diagnosis Date Noted   Hx of polyp of  colon, villous adenoma, resected 2008 w/p partial colectomy, anastomosis 70 cm 05/22/2022   Cognitive impairment 05/22/2022   Gout due to renal impairment 04/22/2022   Dysphagia due to recent stroke    Personal history of stroke with current residual effects (L hemiparesis, dysphagia) 04/16/2022   Anemia of chronic renal failure 04/09/2022   Gait instability with falls 04/09/2022   Heart murmur, systolic 053/29/9242  Diabetic neuropathy ([place] 03/06/2022   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   Cataracts, bilateral 08/05/2021   CKD (chronic kidney disease) stage 5, GFR less than 15 ml/min ([facility] 04/08/2021   Pancreas cysts, incidental finding, likely benign, f/u imaging in 2024 can be considered 08/26/2020   Preventative health care 09/04/2010   Diabetes mellitus type 2, diet-controlled ([place] 04/18/2009   Hyperlipidemia associated with type 2 diabetes mellitus ([place] 06/24/2007   VILLOUS ADENOMA, COLON, HX OF 09/09/2006   Hypertension 06/04/2006    ONSET DATE: ***   REFERRING DIAG: 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  Rationale for Evaluation and Treatment: Rehabilitation  SUBJECTIVE:   SUBJECTIVE STATEMENT: *** Pt accompanied by: {accompnied:27141}  PERTINENT HISTORY: 77y.o. female referred for OP MBS. PMHx of recent right CVA (acute infarct in the right posterior limb of the internal capsule and posteroinferior right lentiform nucleus. Additional acute infarct in the occipital lobe periventricular white matter posterior to the left occipital horn)  with admission to [facility] inpatient rehab center from 10/4-10/17/23. PMH: T2DM with neuropathy, poorly controlled HTN, ESRD with AVF LUE, HLD, former smoker, MR, diastolic HF. She presents with occasional throat-clearing/coughing during meals per her son, [name].   PAIN: Are you having pain? {OPRCPAIN:27236}  FALLS: Has patient fallen in last 6 months?  {EHMCNOBS:96283}  LIVING  ENVIRONMENT: Lives with: {OPRC lives with:25569::"lives with their family"} Lives in: {Lives in:25570}  PLOF:  Level of assistance: {MOQHUTM:54650} Employment: {SLPemployment:25674}  PATIENT GOALS: ***  OBJECTIVE:   DIAGNOSTIC FINDINGS: ***  COGNITION: Overall cognitive status: {cognition:24006} Areas of impairment:  {cognitiveimpairmentslp:27409} Functional deficits: ***  COGNITIVE COMMUNICATION: Following directions: {commands:24018}  Auditory comprehension: {WFL-Impaired:25365} Verbal expression: {WFL-Impaired:25365} Functional communication: {WFL-Impaired:25365}  ORAL MOTOR EXAMINATION: Overall status: {OMESLP2:27645} Comments: ***  STANDARDIZED ASSESSMENTS: {SLPstandardizedassessment:27092}  PATIENT REPORTED OUTCOME MEASURES (PROM): {SLPPROM:27095}   TODAY'S TREATMENT:                                                                                                                                         DATE: ***   PATIENT EDUCATION: Education details: *** Person educated: {Person educated:25204} Education method: {Education Method:25205} Education comprehension: {Education Comprehension:25206}   GOALS: Goals reviewed with patient? Yes  SHORT TERM GOALS: Target date: 08/27/2022   *** Baseline: Goal status: {GOALSTATUS:25110}  2.  *** Baseline:  Goal status: {GOALSTATUS:25110}  3.  *** Baseline:  Goal status: {GOALSTATUS:25110}  4.  *** Baseline:  Goal status: {GOALSTATUS:25110}  5.  *** Baseline:  Goal status: {GOALSTATUS:25110}  6.  *** Baseline:  Goal status: {GOALSTATUS:25110}  LONG TERM GOALS: Target date: ***  *** Baseline:  Goal status: {GOALSTATUS:25110}  2.  *** Baseline:  Goal status: {GOALSTATUS:25110}  3.  *** Baseline:  Goal status: {GOALSTATUS:25110}  4.  *** Baseline:  Goal status: {GOALSTATUS:25110}  5.  *** Baseline:  Goal status: {GOALSTATUS:25110}  6.  *** Baseline:  Goal status:  {GOALSTATUS:25110}  ASSESSMENT:  CLINICAL IMPRESSION: Patient is a 77 y.o. female who was seen today for ***.   OBJECTIVE IMPAIRMENTS: include {SLPOBJIMP:27107}. These impairments are limiting patient from {SLPLIMIT:27108}. Factors affecting potential to achieve goals and functional outcome are {SLP factors:25450}.. Patient will benefit from skilled SLP services to address above impairments and improve overall function.  REHAB POTENTIAL: {rehabpotential:25112}  PLAN:  SLP FREQUENCY: {rehab frequency:25116}  SLP DURATION: {rehab duration:25117}  PLANNED INTERVENTIONS: {SLP treatment/interventions:25449}    ASHIAH KARPOWICZ, CCC-SLP 07/30/2022, 8:05 AM

## 2022-08-05 DIAGNOSIS — I639 Cerebral infarction, unspecified: Secondary | ICD-10-CM | POA: Diagnosis not present

## 2022-08-06 ENCOUNTER — Ambulatory Visit: Payer: Medicare PPO | Admitting: Occupational Therapy

## 2022-08-06 ENCOUNTER — Ambulatory Visit: Payer: Medicare PPO | Admitting: Physical Therapy

## 2022-08-06 ENCOUNTER — Ambulatory Visit: Payer: Medicare PPO | Admitting: Speech Pathology

## 2022-08-10 ENCOUNTER — Ambulatory Visit: Payer: Medicare PPO | Admitting: Occupational Therapy

## 2022-08-10 ENCOUNTER — Encounter: Payer: Self-pay | Admitting: Physical Therapy

## 2022-08-10 ENCOUNTER — Ambulatory Visit: Payer: Medicare PPO | Admitting: Physical Therapy

## 2022-08-10 VITALS — BP 147/62 | HR 68

## 2022-08-10 DIAGNOSIS — N185 Chronic kidney disease, stage 5: Secondary | ICD-10-CM | POA: Diagnosis not present

## 2022-08-10 DIAGNOSIS — D631 Anemia in chronic kidney disease: Secondary | ICD-10-CM | POA: Diagnosis not present

## 2022-08-10 DIAGNOSIS — R29818 Other symptoms and signs involving the nervous system: Secondary | ICD-10-CM | POA: Diagnosis not present

## 2022-08-10 DIAGNOSIS — R41842 Visuospatial deficit: Secondary | ICD-10-CM | POA: Diagnosis not present

## 2022-08-10 DIAGNOSIS — R471 Dysarthria and anarthria: Secondary | ICD-10-CM | POA: Diagnosis not present

## 2022-08-10 DIAGNOSIS — R41840 Attention and concentration deficit: Secondary | ICD-10-CM | POA: Diagnosis not present

## 2022-08-10 DIAGNOSIS — R2689 Other abnormalities of gait and mobility: Secondary | ICD-10-CM

## 2022-08-10 DIAGNOSIS — I120 Hypertensive chronic kidney disease with stage 5 chronic kidney disease or end stage renal disease: Secondary | ICD-10-CM | POA: Diagnosis not present

## 2022-08-10 DIAGNOSIS — M6281 Muscle weakness (generalized): Secondary | ICD-10-CM | POA: Diagnosis not present

## 2022-08-10 DIAGNOSIS — N2581 Secondary hyperparathyroidism of renal origin: Secondary | ICD-10-CM | POA: Diagnosis not present

## 2022-08-10 DIAGNOSIS — R2681 Unsteadiness on feet: Secondary | ICD-10-CM

## 2022-08-10 DIAGNOSIS — R41841 Cognitive communication deficit: Secondary | ICD-10-CM | POA: Diagnosis not present

## 2022-08-10 DIAGNOSIS — R809 Proteinuria, unspecified: Secondary | ICD-10-CM | POA: Diagnosis not present

## 2022-08-10 DIAGNOSIS — I639 Cerebral infarction, unspecified: Secondary | ICD-10-CM | POA: Diagnosis not present

## 2022-08-10 DIAGNOSIS — E1122 Type 2 diabetes mellitus with diabetic chronic kidney disease: Secondary | ICD-10-CM | POA: Diagnosis not present

## 2022-08-10 NOTE — Therapy (Signed)
OUTPATIENT PHYSICAL THERAPY NEURO TREATMENT    Patient Name: Gina Bailey MRN: [id number] DOB:Sep 03, 1945, 77 y.o., female Today's Date: 08/10/2022   PCP: Angelica Pou, MD  REFERRING PROVIDER:  Charlett Blake, MD  END OF SESSION:  PT End of Session - 08/10/22 1021     Visit Number 2    Number of Visits 9    Date for PT Re-Evaluation 09/28/22   due to potential delay in scheduling   Authorization Type [employer] 9 visits approved 1/11- 09/09/22    PT Start Time 1017    PT Stop Time 1058    PT Time Calculation (min) 41 min    Equipment Utilized During Treatment Gait belt    Activity Tolerance Patient tolerated treatment well    Behavior During Therapy WFL for tasks assessed/performed             Past Medical History:  Diagnosis Date   Chronic kidney disease    Diabetes mellitus    Diet-controlled. Not on antiglycemic medications.   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  Based on the size, patient's age, status as a former smoker, and its discovery in a community setting, her probability of malignancy is 3% (low probability).  Based on this, I recommend follow-up with a CT scan around 05/2014, and if lesion is stable or smaller, no further follow-up is needed.  CT Chest 06/2015: Stable 41m pulmonary nodule. No   Ventral hernia    Incisional ventral hernia. S/P repair by Dr. 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 WHulen Skains (08/2006)   Past Surgical History:  Procedure Laterality Date   APPENDECTOMY     AV FISTULA PLACEMENT Left  03/10/2022   Procedure: LEFT ARM ARTERIOVENOUS (AV) FISTULA CREATION VERSUS GRAFT;  Surgeon: CWaynetta Sandy MD;  [place] [facility]  Service: Vascular;  Laterality: Left;   COLON SURGERY  08/2006   Partial right colectomy and anastomosis with resection of villous adenoma.   HEMORRHOID SURGERY     hemorrhoidectomy   TONSILLECTOMY     TUBAL LIGATION     VENTRAL HERNIA REPAIR  08/2008   By Dr. 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   Cataracts, bilateral 08/05/2021   CKD (chronic kidney disease) stage 5, GFR less than 15 ml/min ([place] 04/08/2021   Pancreas cysts, incidental finding, likely benign, f/u imaging in 2024 can be considered 08/26/2020   Preventative health care 09/04/2010   Diabetes mellitus type  2, diet-controlled ([place]) 04/18/2009   Hyperlipidemia associated with type 2 diabetes mellitus ([place]) 06/24/2007   VILLOUS ADENOMA, COLON, HX OF 09/09/2006   Hypertension 06/04/2006    ONSET DATE:  07/16/2022  REFERRING DIAG: I63.9 (ICD-10-CM) - Subcortical infarction ([place])  THERAPY DIAG:  Unsteadiness on feet  Muscle weakness (generalized)  Other abnormalities of gait and mobility  Rationale for Evaluation and Treatment: Rehabilitation  SUBJECTIVE:                                                                                                                                                                                              SUBJECTIVE STATEMENT: No changes, No falls.   Pt accompanied by:  son [name]  PERTINENT HISTORY: Pt presented to the emergency department on 04/15/2022 complaining of left facial droop, slurred speech.  MRI with findings of right posterior limb internal capsule as well as left tiny periventricular white matter lacunar infarcts.  No large vessel stenosis or occlusion on MR angiogram. Etiology likely secondary to chronic small vessel disease. Pt with admission to [facility] inpatient rehab center from 10/4-10/17/23.  Prior to her stroke she did not require any assistive device to ambulate.   PMH significant for T2DM with neuropathy, poorly controlled HTN, ESRD with AVF LUE, HLD, former smoker,  diastolic HF.   PAIN:  Are you having pain? No  Vitals:   08/10/22 1023  BP: (!) 147/62  Pulse: 68     PRECAUTIONS: Fall  WEIGHT BEARING RESTRICTIONS: No  FALLS: Has patient fallen in last 6 months? No  LIVING ENVIRONMENT: Lives with: lives with their son (prior to CVA was also living with son) Lives in: House/apartment Stairs: Yes: External: 1 steps; none Has following equipment at home: Gilford Rile - 2 wheeled, Shower bench, Grab bars, and also has a bed rail   Needs help with bathing. Does not do any household cleaning or laundry.   PLOF: Independent  PATIENT GOALS: Be more independent   OBJECTIVE:   DIAGNOSTIC FINDINGS: On 04/27/22: IMPRESSION: 1. 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 2. Slightly increased size of recent infarct in the posterior limb of the right internal capsule. 3. Extensive chronic small vessel ischemic disease. 4. Motion degraded head MRA without evidence of a large vessel occlusion or significant proximal stenosis.  TODAY'S TREATMENT:            TRANSFERS: Assistive device utilized: None  Sit to stand: SBA Stand to sit: SBA Performs with no UE support   Cues to press up from mat table vs. RW   GAIT: Gait pattern: step through  pattern, decreased  step length- Right, decreased stance time- Left, decreased stride length, decreased hip/knee flexion- Left, and decreased ankle dorsiflexion- Left Trunk lean to L Distance walked: Clinic distances Assistive device utilized: Environmental consultant - 2 wheeled Level of assistance: SBA Comments: Pt with L inattention  Adjusted RW to proper height as it was initially too tall for pt and pt with incr shoulder elevation during gait.                                                                                                                       Therapeutic Exercise: Sidelying Clamshells x10 reps each leg, needing verbal/tactile cues for proper technique and alignment    Access Code: [email or web address] URL: [email or web address] Date: 08/10/2022 Prepared by: Janann August  Initiated HEP below, see MedBridge for further details:   Exercises - Sit to Stand with Armchair  - 2 x daily - 4-5 x weekly - 1 sets - 10 reps - Supine Bridge  - 2 x daily - 4-5 x weekly - 1 sets - 10 reps - Small Range Straight Leg Raise  - 2 x daily - 4-5 x weekly - 1 sets - 10 reps - cues to try to keep knee extended  - Standing March with Counter Support  - 2 x daily - 4-5 x weekly - 2 sets - 10 reps - Side Stepping with Counter Support  - 2 x daily - 4-5 x weekly - 3 sets - cues to pick up feet when stepping vs. Dragging on the floor    PATIENT EDUCATION: Education details: Initial HEP  Person educated: Patient and pt's son Education method: Explanation, Demonstration, and Handouts Education comprehension: verbalized understanding and returned demonstration  HOME EXERCISE PROGRAM: Access Code: [email or web address] URL: [email or web address] Date: 08/10/2022 Prepared by: Janann August  Exercises - Sit to Stand with Armchair  - 2 x daily - 4-5 x weekly - 1 sets - 10 reps - Supine Bridge  - 2 x daily - 4-5 x weekly - 1 sets - 10 reps - Small Range Straight Leg Raise  - 2 x daily - 4-5 x  weekly - 1 sets - 10 reps - Standing March with Counter Support  - 2 x daily - 4-5 x weekly - 2 sets - 10 reps - Side Stepping with Counter Support  - 2 x daily - 4-5 x weekly - 3 sets  GOALS: Goals reviewed with patient? Yes  SHORT TERM GOALS: ALL STGS = LTGS DUE TO POC  LONG TERM GOALS: Target date: 09/24/2022  Pt will be independent with final HEP for strength, gait, balance in order to build upon functional gains made in therapy. Baseline:  Goal status: INITIAL  2.  Pt will improve BERG to at least a 49/56 in order to demo decr fall risk  Baseline: 45/56 Goal status: INITIAL  3.  Pt will improve 5x sit<>stand to less than or equal to 19 sec to demonstrate improved functional strength and transfer efficiency.  Baseline: 26.53 seconds no UE support  Goal status: INITIAL  4.  Pt will improve TUG time to 20 seconds or less with LRAD in order to demo decrease fall risk. Baseline: 28.44 seconds with RW Goal status: INITIAL  5.  Pt will improve gait speed with LRAD to at least 1.8 ft/sec in order to demo improved community mobility and decr fall risk.  Baseline: 34.46 seconds with RW = .95 ft/sec Goal status: INITIAL    ASSESSMENT:  CLINICAL IMPRESSION: Today's skilled session focused on initiating HEP for leg strength, standing balance, and activity tolerance. Pt challenged by LLE exercises with straight leg raises. Pt fatigued easily with these. Pt slow to perform movements, but tolerated session well. Will continue per POC.    OBJECTIVE IMPAIRMENTS: Abnormal gait, decreased activity tolerance, decreased balance, decreased cognition, decreased coordination, decreased mobility, decreased strength, decreased safety awareness, hypomobility, impaired tone, impaired vision/preception, and postural dysfunction.   ACTIVITY LIMITATIONS: carrying, lifting, bending, squatting, stairs, transfers, bed mobility, bathing, dressing, and locomotion level  PARTICIPATION LIMITATIONS: cleaning,  laundry, driving, shopping, community activity, and yard work  PERSONAL FACTORS: Age, Behavior pattern, Past/current experiences, Time since onset of injury/illness/exacerbation, and 3+ comorbidities:  T2DM with neuropathy, poorly controlled HTN, ESRD with AVF LUE, HLD, former smoker,  diastolic HF.  are also affecting patient's functional outcome.   REHAB POTENTIAL: Good  CLINICAL DECISION MAKING: Evolving/moderate complexity  EVALUATION COMPLEXITY: Moderate  PLAN:  PT FREQUENCY: 2x/week  PT DURATION: 8 weeks  PLANNED INTERVENTIONS: Therapeutic exercises, Therapeutic activity, Neuromuscular re-education, Balance training, Gait training, Patient/Family education, Self Care, Stair training, Vestibular training, Visual/preceptual remediation/compensation, Orthotic/Fit training, DME instructions, and Re-evaluation  PLAN FOR NEXT SESSION: Work on standing balance, functional strength. Try SciFit. Monitor BP   Arliss Journey, PT, DPT  08/10/2022, 11:57 AM

## 2022-08-12 ENCOUNTER — Encounter: Payer: Self-pay | Admitting: Physical Therapy

## 2022-08-12 ENCOUNTER — Ambulatory Visit: Payer: Medicare PPO | Admitting: Physical Therapy

## 2022-08-12 ENCOUNTER — Ambulatory Visit: Payer: Medicare PPO | Admitting: Speech Pathology

## 2022-08-12 VITALS — BP 132/65 | HR 68

## 2022-08-12 DIAGNOSIS — R2689 Other abnormalities of gait and mobility: Secondary | ICD-10-CM

## 2022-08-12 DIAGNOSIS — R41842 Visuospatial deficit: Secondary | ICD-10-CM | POA: Diagnosis not present

## 2022-08-12 DIAGNOSIS — R41840 Attention and concentration deficit: Secondary | ICD-10-CM | POA: Diagnosis not present

## 2022-08-12 DIAGNOSIS — M6281 Muscle weakness (generalized): Secondary | ICD-10-CM | POA: Diagnosis not present

## 2022-08-12 DIAGNOSIS — R2681 Unsteadiness on feet: Secondary | ICD-10-CM

## 2022-08-12 DIAGNOSIS — I639 Cerebral infarction, unspecified: Secondary | ICD-10-CM | POA: Diagnosis not present

## 2022-08-12 DIAGNOSIS — R29818 Other symptoms and signs involving the nervous system: Secondary | ICD-10-CM | POA: Diagnosis not present

## 2022-08-12 DIAGNOSIS — R41841 Cognitive communication deficit: Secondary | ICD-10-CM | POA: Diagnosis not present

## 2022-08-12 DIAGNOSIS — R471 Dysarthria and anarthria: Secondary | ICD-10-CM | POA: Diagnosis not present

## 2022-08-12 NOTE — Therapy (Signed)
OUTPATIENT PHYSICAL THERAPY NEURO TREATMENT    Patient Name: Gina Bailey MRN: [id number] DOB:Feb 14, 1946, 77 y.o., female Today's Date: 08/12/2022   PCP: Angelica Pou, MD  REFERRING PROVIDER:  Charlett Blake, MD  END OF SESSION:  PT End of Session - 08/12/22 1016     Visit Number 3    Number of Visits 9    Date for PT Re-Evaluation 09/28/22   due to potential delay in scheduling   Authorization Type [employer] 9 visits approved 1/11- 09/09/22    PT Start Time 1013    PT Stop Time 1056    PT Time Calculation (min) 43 min    Equipment Utilized During Treatment Gait belt    Activity Tolerance Patient tolerated treatment well    Behavior During Therapy WFL for tasks assessed/performed             Past Medical History:  Diagnosis Date   Chronic kidney disease    Diabetes mellitus    Diet-controlled. Not on antiglycemic medications.   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  Based on the size, patient's age, status as a former smoker, and its discovery in a community setting, her probability of malignancy is 3% (low probability).  Based on this, I recommend follow-up with a CT scan around 05/2014, and if lesion is stable or smaller, no further follow-up is needed.  CT Chest 06/2015: Stable 79m pulmonary nodule. No   Ventral hernia    Incisional ventral hernia. S/P repair by Dr. 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 WHulen Skains (08/2006)   Past Surgical History:  Procedure Laterality Date   APPENDECTOMY     AV FISTULA PLACEMENT Left  03/10/2022   Procedure: LEFT ARM ARTERIOVENOUS (AV) FISTULA CREATION VERSUS GRAFT;  Surgeon: CWaynetta Sandy MD;  [place] [facility]  Service: Vascular;  Laterality: Left;   COLON SURGERY  08/2006   Partial right colectomy and anastomosis with resection of villous adenoma.   HEMORRHOID SURGERY     hemorrhoidectomy   TONSILLECTOMY     TUBAL LIGATION     VENTRAL HERNIA REPAIR  08/2008   By Dr. 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, dysphagia) 04/16/2022   Anemia of chronic renal failure 04/09/2022   Gait instability with falls 04/09/2022   Heart murmur, systolic 011/94/1740  Diabetic neuropathy ([facility] 03/06/2022   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   Cataracts, bilateral 08/05/2021   CKD (chronic kidney disease) stage 5, GFR less than 15 ml/min ([facility] 04/08/2021   Pancreas cysts, incidental finding, likely benign, f/u imaging in 2024 can be considered 08/26/2020   Preventative health care 09/04/2010   Diabetes mellitus type  2, diet-controlled ([place]) 04/18/2009   Hyperlipidemia associated with type 2 diabetes mellitus ([place]) 06/24/2007   VILLOUS ADENOMA, COLON, HX OF 09/09/2006   Hypertension 06/04/2006    ONSET DATE:  07/16/2022  REFERRING DIAG: I63.9 (ICD-10-CM) - Subcortical infarction ([place])  THERAPY DIAG:  Unsteadiness on feet  Muscle weakness (generalized)  Other abnormalities of gait and mobility  Rationale for Evaluation and Treatment: Rehabilitation  SUBJECTIVE:                                                                                                                                                                                              SUBJECTIVE STATEMENT: Tried the exercises at home, they went ok. No falls.   Pt accompanied by:  son [name]  PERTINENT HISTORY: Pt presented to the emergency department on 04/15/2022 complaining of left facial droop, slurred speech.  MRI with findings of right posterior limb internal capsule as well as left tiny periventricular white matter lacunar infarcts.  No large vessel stenosis or occlusion on MR angiogram. Etiology likely secondary to chronic small vessel disease. Pt with admission to [facility] inpatient rehab center from 10/4-10/17/23.  Prior to her stroke she did not require any assistive device to ambulate.   PMH significant for T2DM with neuropathy, poorly controlled HTN, ESRD with AVF LUE, HLD, former smoker,  diastolic HF.   PAIN:  Are you having pain? No  Vitals:   08/12/22 1018  BP: 132/65  Pulse: 68      PRECAUTIONS: Fall  WEIGHT BEARING RESTRICTIONS: No  FALLS: Has patient fallen in last 6 months? No  LIVING ENVIRONMENT: Lives with: lives with their son (prior to CVA was also living with son) Lives in: House/apartment Stairs: Yes: External: 1 steps; none Has following equipment at home: Gilford Rile - 2 wheeled, Shower bench, Grab bars, and also has a bed rail   Needs help with bathing. Does not do any household cleaning or laundry.   PLOF: Independent  PATIENT GOALS: Be more independent   OBJECTIVE:   DIAGNOSTIC FINDINGS: On 04/27/22: IMPRESSION: 1. 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 2. Slightly increased size of recent infarct in the posterior limb of the right internal capsule. 3. Extensive chronic small vessel ischemic disease. 4. Motion degraded head MRA without evidence of a large vessel occlusion or significant proximal stenosis.  TODAY'S TREATMENT:            TRANSFERS: Assistive device utilized: None  Sit to stand: SBA Stand to sit: SBA Performs with no UE support   Cues to press up from mat table vs. RW    GAIT:  Gait pattern: step through pattern, decreased step length- Right, decreased stance time- Left, decreased stride length, decreased hip/knee flexion- Left, and decreased ankle dorsiflexion- Left Trunk lean to L Distance walked: Clinic distances Assistive device utilized: Environmental consultant - 2 wheeled Level of assistance: SBA Comments: Pt with L inattention  Adjusted RW to proper height as it was initially too tall for pt and pt with incr shoulder elevation during gait.                                                                                                                       Therapeutic Exercise:  NuStep with BLE/BUE at level 2 > 4 for 8 minutes for strengthening, activity tolerance, aerobic warm-up. Cues to keep spm in the 50s (pt staying steadily at 48). Pt needing frequent encouragement to try to incr speed   Standing in // bars with BUE support: Alternating SLS taps to 4" step 2 sets of 10 reps, attempted to perform some without UE support, pt needing to use the bars at all time for balance, incr LLE fatigue  Forward steps ups to 4" step x10 reps each leg  Alternating hip ABD 2 sets of 10 reps, cued for proper technique and keeping toes facing forwards, pt easily distracted and looking out into the gym and needing cues throughout to pay attention to the exercise  Alternating stepping over 2" obstacle and back to midline x10 reps each leg, pt with incr lean to the R side, focus on trying not to hit the obstacle for improved foot clearance  Heel toe raises x10 reps with focus on full ROM, attempted another 10 reps without UE support to work on balance (pt with less ROM)  PATIENT EDUCATION: Education details: Continue HEP  Person educated: Patient and pt's son Education method: Explanation Education comprehension: verbalized understanding  HOME EXERCISE PROGRAM: Access Code: [email or web address] URL: [email or web address] Date: 08/10/2022 Prepared by: Janann August  Exercises - Sit to Stand with Armchair  - 2 x daily - 4-5 x weekly - 1 sets - 10 reps - Supine Bridge  - 2 x daily - 4-5 x weekly - 1 sets - 10 reps - Small Range Straight Leg Raise  - 2 x daily - 4-5 x weekly - 1 sets - 10 reps - Standing March with Counter Support  - 2 x daily - 4-5 x weekly - 2 sets - 10 reps - Side Stepping with Counter Support  - 2 x daily - 4-5 x weekly - 3 sets  GOALS: Goals reviewed with patient? Yes  SHORT TERM GOALS: ALL STGS = LTGS DUE TO POC  LONG TERM GOALS: Target date: 09/24/2022  Pt will be independent with final HEP for strength, gait, balance in order to build upon functional gains made in therapy. Baseline:  Goal status: INITIAL  2.  Pt will improve BERG to at least a 49/56 in order to demo decr fall risk  Baseline: 45/56 Goal status: INITIAL  3.  Pt will improve  5x sit<>stand to less than or equal to 19 sec to demonstrate improved functional strength and transfer efficiency.  Baseline: 26.53 seconds no UE support  Goal status: INITIAL  4.  Pt will improve TUG time to 20 seconds or less with LRAD in order to demo decrease fall risk. Baseline: 28.44 seconds with RW Goal status: INITIAL  5.  Pt will improve gait speed with LRAD to at least 1.8 ft/sec in order to demo improved community mobility and decr fall risk.  Baseline: 34.46 seconds with RW = .95 ft/sec Goal status: INITIAL    ASSESSMENT:  CLINICAL IMPRESSION: Today's skilled session focused on BLE strengthening, endurance, and standing balance tasks in the // bars. With SLS tasks, pt needing to use UE support for balance. Pt's LLE fatigued more with standing activity. Pt can be easily distracted during session and looking off into the gym and needing cues to pay attention to the task at home. Pt tolerated session well, will continue to progress towards LTGs.    OBJECTIVE IMPAIRMENTS: Abnormal gait, decreased activity tolerance, decreased balance, decreased cognition,  decreased coordination, decreased mobility, decreased strength, decreased safety awareness, hypomobility, impaired tone, impaired vision/preception, and postural dysfunction.   ACTIVITY LIMITATIONS: carrying, lifting, bending, squatting, stairs, transfers, bed mobility, bathing, dressing, and locomotion level  PARTICIPATION LIMITATIONS: cleaning, laundry, driving, shopping, community activity, and yard work  PERSONAL FACTORS: Age, Behavior pattern, Past/current experiences, Time since onset of injury/illness/exacerbation, and 3+ comorbidities:  T2DM with neuropathy, poorly controlled HTN, ESRD with AVF LUE, HLD, former smoker,  diastolic HF.  are also affecting patient's functional outcome.   REHAB POTENTIAL: Good  CLINICAL DECISION MAKING: Evolving/moderate complexity  EVALUATION COMPLEXITY: Moderate  PLAN:  PT FREQUENCY: 2x/week  PT DURATION: 8 weeks  PLANNED INTERVENTIONS: Therapeutic exercises, Therapeutic activity, Neuromuscular re-education, Balance training, Gait training, Patient/Family education, Self Care, Stair training, Vestibular training, Visual/preceptual remediation/compensation, Orthotic/Fit training, DME instructions, and Re-evaluation  PLAN FOR NEXT SESSION: Work on standing balance, functional strength. Try working on some standing balance on the air ex. Try SciFit. Monitor BP   Arliss Journey, PT, DPT  08/12/2022, 10:59 AM

## 2022-08-17 ENCOUNTER — Encounter: Payer: Self-pay | Admitting: Occupational Therapy

## 2022-08-17 ENCOUNTER — Ambulatory Visit: Payer: Medicare PPO | Admitting: Occupational Therapy

## 2022-08-17 ENCOUNTER — Ambulatory Visit: Payer: Medicare PPO | Admitting: Physical Therapy

## 2022-08-17 ENCOUNTER — Encounter: Payer: Self-pay | Admitting: Physical Therapy

## 2022-08-17 DIAGNOSIS — R41841 Cognitive communication deficit: Secondary | ICD-10-CM | POA: Diagnosis not present

## 2022-08-17 DIAGNOSIS — M6281 Muscle weakness (generalized): Secondary | ICD-10-CM

## 2022-08-17 DIAGNOSIS — R2681 Unsteadiness on feet: Secondary | ICD-10-CM

## 2022-08-17 DIAGNOSIS — I639 Cerebral infarction, unspecified: Secondary | ICD-10-CM

## 2022-08-17 DIAGNOSIS — R41842 Visuospatial deficit: Secondary | ICD-10-CM | POA: Diagnosis not present

## 2022-08-17 DIAGNOSIS — R29818 Other symptoms and signs involving the nervous system: Secondary | ICD-10-CM | POA: Diagnosis not present

## 2022-08-17 DIAGNOSIS — R41840 Attention and concentration deficit: Secondary | ICD-10-CM

## 2022-08-17 DIAGNOSIS — R2689 Other abnormalities of gait and mobility: Secondary | ICD-10-CM

## 2022-08-17 DIAGNOSIS — R471 Dysarthria and anarthria: Secondary | ICD-10-CM | POA: Diagnosis not present

## 2022-08-17 NOTE — Therapy (Signed)
OUTPATIENT PHYSICAL THERAPY NEURO TREATMENT    Patient Name: Gina Bailey MRN: [id number] DOB:Nov 08, 1945, 77 y.o., female Today's Date: 08/17/2022   PCP: Angelica Pou, MD  REFERRING PROVIDER:  Charlett Blake, MD  END OF SESSION:  PT End of Session - 08/17/22 1021     Visit Number 4    Number of Visits 9    Date for PT Re-Evaluation 09/28/22   due to potential delay in scheduling   Authorization Type [employer] 9 visits approved 1/11- 09/09/22    PT Start Time 1019    PT Stop Time 1059    PT Time Calculation (min) 40 min    Equipment Utilized During Treatment Gait belt    Activity Tolerance Patient tolerated treatment well    Behavior During Therapy WFL for tasks assessed/performed             Past Medical History:  Diagnosis Date   Chronic kidney disease    Diabetes mellitus    Diet-controlled. Not on antiglycemic medications.   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  Based on the size, patient's age, status as a former smoker, and its discovery in a community setting, her probability of malignancy is 3% (low probability).  Based on this, I recommend follow-up with a CT scan around 05/2014, and if lesion is stable or smaller, no further follow-up is needed.  CT Chest 06/2015: Stable 70m pulmonary nodule. No   Ventral hernia    Incisional ventral hernia. S/P repair by Dr. 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 WHulen Skains (08/2006)   Past Surgical History:  Procedure Laterality Date   APPENDECTOMY     AV FISTULA PLACEMENT Left  03/10/2022   Procedure: LEFT ARM ARTERIOVENOUS (AV) FISTULA CREATION VERSUS GRAFT;  Surgeon: CWaynetta Sandy MD;  [place] [facility]  Service: Vascular;  Laterality: Left;   COLON SURGERY  08/2006   Partial right colectomy and anastomosis with resection of villous adenoma.   HEMORRHOID SURGERY     hemorrhoidectomy   TONSILLECTOMY     TUBAL LIGATION     VENTRAL HERNIA REPAIR  08/2008   By Dr. 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, dysphagia) 04/16/2022   Anemia of chronic renal failure 04/09/2022   Gait instability with falls 04/09/2022   Heart murmur, systolic 039/76/7341  Diabetic neuropathy ([facility] 03/06/2022   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   Cataracts, bilateral 08/05/2021   CKD (chronic kidney disease) stage 5, GFR less than 15 ml/min ([facility] [place] 04/08/2021   Pancreas cysts, incidental finding, likely benign, f/u imaging in 2024 can be considered 08/26/2020   Preventative health care 09/04/2010   Diabetes mellitus type  2, diet-controlled ([place]) 04/18/2009   Hyperlipidemia associated with type 2 diabetes mellitus ([place]) 06/24/2007   VILLOUS ADENOMA, COLON, HX OF 09/09/2006   Hypertension 06/04/2006    ONSET DATE:  07/16/2022  REFERRING DIAG: 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 No falls. Had a good weekend.   Pt accompanied by:  son [name]  PERTINENT HISTORY: Pt presented to the emergency department on 04/15/2022 complaining of left facial droop, slurred speech.  MRI with findings of right posterior limb internal capsule as well as left tiny periventricular white matter lacunar infarcts.  No large vessel stenosis or occlusion on MR angiogram. Etiology likely secondary to chronic small vessel disease. Pt with admission to [facility] inpatient rehab center from 10/4-10/17/23.  Prior to her stroke she did not require any assistive device to ambulate.   PMH significant for T2DM with neuropathy, poorly controlled HTN, ESRD with AVF LUE, HLD, former smoker,  diastolic HF.   PAIN:  Are you having pain? No  There were no vitals filed for this visit.     PRECAUTIONS: Fall  WEIGHT BEARING RESTRICTIONS: No  FALLS: Has patient fallen in last 6 months? No  LIVING ENVIRONMENT: Lives with: lives with their son (prior to CVA was also living with son) Lives in: House/apartment Stairs: Yes: External: 1 steps; none Has following equipment at home: Gilford Rile - 2 wheeled, Shower bench, Grab bars, and also has a bed rail   Needs help with bathing. Does not do any household cleaning or laundry.   PLOF: Independent  PATIENT GOALS: Be more independent   OBJECTIVE:   DIAGNOSTIC FINDINGS: On 04/27/22: IMPRESSION: 1. 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 2. Slightly increased size of recent infarct in the posterior limb of the right internal capsule. 3. Extensive chronic small vessel ischemic disease. 4. Motion degraded head MRA without evidence of a large vessel occlusion or significant proximal stenosis.  TODAY'S TREATMENT:            TRANSFERS: Assistive device utilized: None  Sit to stand: SBA Stand to sit: SBA Performs with no UE support   Cues to press up from mat table vs. RW and cues to reach  back when going  to sit down.   GAIT: Gait pattern: step through pattern, decreased step length- Right, decreased stance time- Left, decreased stride length, decreased hip/knee flexion- Left, and decreased ankle dorsiflexion- Left Trunk lean to L Distance walked: Clinic distances Assistive device utilized: Environmental consultant - 2 wheeled Level of assistance: SBA Comments: Pt with L inattention                                                                                                                      Therapeutic Exercise:  NuStep with BLE/BUE at level 2.0 > 3.0 (pt reporting 2.0 felt too easy) for 8 minutes for strengthening, activity tolerance, ROM. Pt keeping spm in the 39s.  Pt needing frequent encouragement to try to incr speed   NMR:  Standing in // bars: Keeping RLE as stance leg and stepping LLE forwards and back to midline, performed with no UE support x10 reps, cues for incr step length with LLE, pt reporting incr LLE fatigue afterwards.  With LLE as stance leg on the floor and RLE on 4" step, 3 x 20 second holds for modified SLS, cues for glute activation and posture, pt with intermittent lean over to L and needing bars as needed for balance  On blue air ex: With feet apart ball toss with PT tech with ball thrown to R/L x20 reps, cues for posture throughout  Reaching across body towards L side due to L inattention and for incr weight bearing to L side to touch cone and then come back to midline x20 reps, cues to turn head to look towards cone   Sit <> stands from mat table with focus on equal weight bearing and standing up quickly and then sitting down slowly x10 reps, performed an additional 10 reps with 2" step under RLE to incr weight bearing through LLE, pt more fatigued after these   PATIENT EDUCATION: Education details: Continue HEP  Person educated: Patient and pt's son Education method: Explanation Education comprehension: verbalized understanding  HOME EXERCISE  PROGRAM: Access Code: [email or web address] URL: [email or web address] Date: 08/10/2022 Prepared by: Janann August  Exercises - Sit to Stand with Armchair  - 2 x daily - 4-5 x weekly - 1 sets - 10 reps - Supine Bridge  - 2 x daily - 4-5 x weekly - 1 sets - 10 reps - Small Range Straight Leg Raise  - 2 x daily - 4-5 x weekly - 1 sets - 10 reps - Standing March with Counter Support  - 2 x daily - 4-5 x weekly - 2 sets - 10 reps - Side Stepping with Counter Support  - 2 x daily - 4-5 x weekly - 3 sets  GOALS: Goals reviewed with patient? Yes  SHORT TERM GOALS: ALL STGS = LTGS DUE TO POC  LONG TERM GOALS: Target date: 09/24/2022  Pt will be independent with final HEP for strength, gait, balance in order to build upon functional gains made in therapy. Baseline:  Goal status: INITIAL  2.  Pt will  improve BERG to at least a 49/56 in order to demo decr fall risk  Baseline: 45/56 Goal status: INITIAL  3.  Pt will improve 5x sit<>stand to less than or equal to 19 sec to demonstrate improved functional strength and transfer efficiency.  Baseline: 26.53 seconds no UE support  Goal status: INITIAL  4.  Pt will improve TUG time to 20 seconds or less with LRAD in order to demo decrease fall risk. Baseline: 28.44 seconds with RW Goal status: INITIAL  5.  Pt will improve gait speed with LRAD to at least 1.8 ft/sec in order to demo improved community mobility and decr fall risk.  Baseline: 34.46 seconds with RW = .95 ft/sec Goal status: INITIAL    ASSESSMENT:  CLINICAL IMPRESSION: Today's skilled session focused on LLE>RLE strengthening, sit <> stands, and standing balance strategies on compliant surfaces and working on decr UE support. Pt more fatigued today with exercises with incr weight bearing through L side. Pt needing cues at times to stay focused at the task at hand as pt can get distracted. Pt tolerated session well, will continue to progress towards LTGs.    OBJECTIVE  IMPAIRMENTS: Abnormal gait, decreased activity tolerance, decreased balance, decreased cognition, decreased coordination, decreased mobility, decreased strength, decreased safety awareness, hypomobility, impaired tone, impaired vision/preception, and postural dysfunction.   ACTIVITY LIMITATIONS: carrying, lifting, bending, squatting, stairs, transfers, bed mobility, bathing, dressing, and locomotion level  PARTICIPATION LIMITATIONS: cleaning, laundry, driving, shopping, community activity, and yard work  PERSONAL FACTORS: Age, Behavior pattern, Past/current experiences, Time since onset of injury/illness/exacerbation, and 3+ comorbidities:  T2DM with neuropathy, poorly controlled HTN, ESRD with AVF LUE, HLD, former smoker,  diastolic HF.  are also affecting patient's functional outcome.   REHAB POTENTIAL: Good  CLINICAL DECISION MAKING: Evolving/moderate complexity  EVALUATION COMPLEXITY: Moderate  PLAN:  PT FREQUENCY: 2x/week  PT DURATION: 8 weeks  PLANNED INTERVENTIONS: Therapeutic exercises, Therapeutic activity, Neuromuscular re-education, Balance training, Gait training, Patient/Family education, Self Care, Stair training, Vestibular training, Visual/preceptual remediation/compensation, Orthotic/Fit training, DME instructions, and Re-evaluation  PLAN FOR NEXT SESSION: Work on standing balance, functional strength. Try working on some standing balance on the air ex. Try SciFit. Monitor BP   Arliss Journey, PT, DPT  08/17/2022, 11:12 AM

## 2022-08-17 NOTE — Therapy (Signed)
OUTPATIENT OCCUPATIONAL THERAPY NEURO EVALUATION  Patient Name: Gina Bailey MRN: [id number] DOB:1946/01/10, 77 y.o., female Today's Date: 08/17/2022  PCP: Angelica Pou, MD  REFERRING PROVIDER: Charlett Blake, MD  END OF SESSION:   Past Medical History:  Diagnosis Date   Chronic kidney disease    Diabetes mellitus    Diet-controlled. Not on antiglycemic medications.   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  Based on the size, patient's age, status as a former smoker, and its discovery in a community setting, her probability of malignancy is 3% (low probability).  Based on this, I recommend follow-up with a CT scan around 05/2014, and if lesion is stable or smaller, no further follow-up is needed.  CT Chest 06/2015: Stable 97m pulmonary nodule. No   Ventral hernia    Incisional ventral hernia. S/P repair by Dr. 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 WHulen Skains (08/2006)   Past Surgical History:  Procedure Laterality Date   APPENDECTOMY     AV FISTULA PLACEMENT Left 03/10/2022   Procedure: LEFT ARM ARTERIOVENOUS (AV) FISTULA CREATION VERSUS GRAFT;  Surgeon: CWaynetta Sandy MD;  [facility]  Service: Vascular;  Laterality: Left;   COLON SURGERY  08/2006   Partial right colectomy and anastomosis with resection of villous adenoma.   HEMORRHOID SURGERY     hemorrhoidectomy   TONSILLECTOMY     TUBAL LIGATION     VENTRAL HERNIA REPAIR  08/2008   By Dr. WHulen Skains   Patient Active Problem List   Diagnosis Date Noted   Hx of polyp of  colon, villous adenoma, resected 2008 w/p partial colectomy, anastomosis 70 cm 05/22/2022   Cognitive impairment 05/22/2022   Gout due to renal impairment 04/22/2022   Dysphagia due to recent stroke    Personal history of stroke with current residual effects (L hemiparesis, dysphagia) 04/16/2022   Anemia of chronic renal failure 04/09/2022   Gait instability with falls 04/09/2022   Heart murmur, systolic 012/45/8099  Diabetic neuropathy ([place] 03/06/2022   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   Cataracts, bilateral 08/05/2021   CKD (chronic kidney disease) stage 5, GFR less than 15 ml/min ([place] 04/08/2021   Pancreas cysts, incidental finding, likely benign, f/u imaging in 2024 can be considered 08/26/2020   Preventative health care 09/04/2010   Diabetes mellitus type 2, diet-controlled ([place] 04/18/2009   Hyperlipidemia associated with type 2 diabetes mellitus ([facility] 06/24/2007   VILLOUS ADENOMA, COLON, HX OF 09/09/2006   Hypertension 06/04/2006    ONSET DATE: 07/16/2022   REFERRING DIAG: 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  Rationale for Evaluation and Treatment: Rehabilitation  SUBJECTIVE:   SUBJECTIVE STATEMENT: Got inpatient rehab in the hospital and got home health therapies for approx. 1 month. Reports all she did was walking and getting up and down from the chair. Has a tub bench and has only used twice (when someone came over to help), they did not work on this in therapy.  Is currently now just bathing at the sink. Only sometimes uses the RW, and sometimes does not use the RW because it gets in her way. Has not had any falls. Has some light weights and something she uses for gripping exercises for home. Can use her L arm more. Prior to her CVA, was not using any AD to help her walk. She enjoys cooking.  Pt accompanied by: family member - son, [name]  PERTINENT HISTORY: Pt presented to the emergency department on 04/15/2022  complaining of left facial droop, slurred speech.  MRI with findings of right posterior limb internal capsule as well as left tiny periventricular white matter lacunar infarcts.  No large vessel stenosis or occlusion on MR angiogram. Etiology likely secondary to chronic small vessel disease. Pt with admission to [facility] inpatient rehab center from 10/4-10/17/23.   Prior to her stroke she did not require any assistive device to ambulate.    PMH significant for T2DM with neuropathy, poorly controlled HTN, ESRD with AVF LUE, HLD, former smoker,  diastolic HF.   PRECAUTIONS: Fall  WEIGHT BEARING RESTRICTIONS: No  PAIN:  Are you having pain? Yes: NPRS scale: 1-2/10 Pain location: L leg Pain description: sore from PT Aggravating factors: exercises Relieving factors: rest  FALLS: Has patient fallen in last 6 months? No  LIVING ENVIRONMENT: Lives with: lives with their son (prior to CVA was also living with son) Lives in: House/apartment Stairs: Yes: External: 1 steps; none Has following equipment at home: Gilford Rile - 2 wheeled, Shower bench, Grab bars, and also has a bed rail    Needs help with bathing. Does not do any household cleaning or laundry.   PLOF: Independent; had stopped driving; retired [employer]  PATIENT GOALS: return to PLOF  OBJECTIVE:   HAND DOMINANCE: Right  ADLs: Eating: independent Grooming: independent UB Dressing: modified independent (occasionally needs assistance to orient clothing front to back) LB Dressing: modified independent (occasionally needs assistance to orient clothing front to back) Toileting: independent Bathing: mod I Tub Shower transfers: min A Equipment: Transfer tub bench, bed side commode, and suction cup grab bars  IADLs: Shopping: dependent Light housekeeping: dependent Meal Prep: dependent Community mobility: dependent at baseline Medication management: dependent with pill box Financial management:  dependent Handwriting: 100% legible  MOBILITY STATUS: Needs Assist: min A uses RW  ACTIVITY TOLERANCE: Activity tolerance: fair  FUNCTIONAL OUTCOME MEASURES: FOTO: 61   UPPER EXTREMITY ROM:    AROM Right (eval) Left (eval)  Shoulder flexion WNL WFL  Shoulder abduction WNL WNL  Elbow flexion WNL WNL  Elbow extension WNL WNL  Wrist flexion WNL WNL  Wrist extension WNL WNL  Wrist pronation WNL WNL  Wrist supination WNL WNL   Digit Composite Flexion WNL WNL  Digit Composite Extension WNL WNL  Digit Opposition WNL WNL  (Blank rows = not tested)  UPPER EXTREMITY MMT:     MMT Right (eval) Left (eval)  Shoulder flexion WNL WNL  Shoulder abduction WNL WNL  Elbow flexion WNL WNL  Elbow extension WNL 3+/5  (Blank rows = not tested)  HAND FUNCTION: Grip strength: Right: 40.5 lbs; Left: 44.5 lbs  COORDINATION: Box and Blocks:  Right 37blocks, Left 33blocks  SENSATION: WFL  EDEMA: none  MUSCLE TONE: LUE: Within functional limits  COGNITION: Overall cognitive status: Within functional limits for tasks assessed and see ST eval  VISION: Subjective report: no change reported by pt, son reports she drifts to left side  Baseline vision: Wears glasses for reading only  Visual history: cataracts  VISION ASSESSMENT: Inattention to L side  PERCEPTION: Impaired: Inattention/neglect: does not attend to left visual field  PRAXIS: WFL  OBSERVATIONS: Pt appears well kept. Uses RW with min A for mobility.    TODAY'S TREATMENT:                                                                                                                               Pt encouraged to place items on L side of body to improve attention and awareness I.e. side table and remote but also complete puzzles or crosswords for similar benefit.  PATIENT EDUCATION: Education details: OT POC; Role of OT Person educated: Patient and Child(ren) Education method: Explanation Education comprehension:  verbalized understanding  HOME EXERCISE PROGRAM: N/a   GOALS:  SHORT TERM GOALS: Target date: 09/14/2022   Patient will demonstrate LUE HEP with 25% verbal cues or less for proper execution. Baseline: Goal status: INITIAL  2.  Pt will complete light meal prep (make sandwich or bowl of cereal) with good safety and SBA. Baseline: dependent Goal status: INITIAL  3.  Patient will report at least 2 compensatory strategies for visual impairment/attention to L side without cueing. Baseline:  Goal status: INITIAL   LONG TERM GOALS: Target date: 10/02/2022  Patient will demonstrate updated LUE HEP with 25% verbal cues or less for proper execution. Baseline:  Goal status: INITIAL  2.  Pt will demonstrate ability to cook item at stovetop such as eggs or boxed item with distance supervision only. Baseline: dependent Goal status: INITIAL  3.  Pt will complete d/c FOTO. Baseline:  Goal status: INITIAL  4.  Pt will report no difficulty placing 1 lb item on shelf at shoulder level using LUE. Baseline: mild difficulty Goal status: INITIAL   ASSESSMENT:  CLINICAL IMPRESSION: Patient is a 77 y.o. female who was seen today for occupational therapy evaluation for CVA. Hx includes T2DM with neuropathy, poorly controlled HTN, ESRD with AVF LUE, HLD, former smoker,  diastolic HF. Patient currently presents slightly below baseline level of functioning demonstrating functional deficits and impairments as noted below. Pt would benefit from skilled OT services in the outpatient setting to work on impairments as noted below to help pt return to PLOF as able.    PERFORMANCE DEFICITS: in functional skills including ADLs, IADLs, coordination, strength, Fine motor control, mobility, endurance, vision, and UE functional use.  IMPAIRMENTS: are limiting patient from ADLs and IADLs.   CO-MORBIDITIES: may have co-morbidities  that affects occupational performance. Patient will benefit from skilled OT to  address above impairments and improve overall function.  MODIFICATION OR ASSISTANCE TO COMPLETE EVALUATION: No modification of tasks or assist necessary to complete an evaluation.  OT OCCUPATIONAL PROFILE AND HISTORY: Problem focused assessment: Including review of records relating to presenting problem.  CLINICAL DECISION MAKING: LOW - limited treatment options, no task modification necessary  REHAB POTENTIAL: Good  EVALUATION COMPLEXITY: Low    PLAN:  OT FREQUENCY: 2x/week  OT DURATION: 6 weeks  PLANNED INTERVENTIONS: self care/ADL training, therapeutic exercise, therapeutic activity, neuromuscular re-education, patient/family education, cognitive remediation/compensation, visual/perceptual remediation/compensation, energy conservation, DME and/or AE instructions, and Re-evaluation  RECOMMENDED OTHER SERVICES: none at this time  CONSULTED AND AGREED WITH PLAN OF CARE: Patient and family member/caregiver  PLAN FOR NEXT SESSION: Attention, scanning; ADL kitchen tasks   Dennis Bast, OT 08/17/2022, 10:04 AM

## 2022-08-19 ENCOUNTER — Ambulatory Visit: Payer: Medicare PPO

## 2022-08-19 ENCOUNTER — Ambulatory Visit: Payer: Medicare PPO | Admitting: Occupational Therapy

## 2022-08-19 ENCOUNTER — Encounter: Payer: Self-pay | Admitting: Physical Therapy

## 2022-08-19 ENCOUNTER — Ambulatory Visit: Payer: Medicare PPO | Admitting: Physical Therapy

## 2022-08-19 ENCOUNTER — Encounter: Payer: Self-pay | Admitting: Occupational Therapy

## 2022-08-19 VITALS — BP 165/74 | HR 70

## 2022-08-19 DIAGNOSIS — R41842 Visuospatial deficit: Secondary | ICD-10-CM | POA: Diagnosis not present

## 2022-08-19 DIAGNOSIS — R41841 Cognitive communication deficit: Secondary | ICD-10-CM

## 2022-08-19 DIAGNOSIS — R29818 Other symptoms and signs involving the nervous system: Secondary | ICD-10-CM | POA: Diagnosis not present

## 2022-08-19 DIAGNOSIS — R2681 Unsteadiness on feet: Secondary | ICD-10-CM

## 2022-08-19 DIAGNOSIS — R41840 Attention and concentration deficit: Secondary | ICD-10-CM | POA: Diagnosis not present

## 2022-08-19 DIAGNOSIS — R2689 Other abnormalities of gait and mobility: Secondary | ICD-10-CM | POA: Diagnosis not present

## 2022-08-19 DIAGNOSIS — M6281 Muscle weakness (generalized): Secondary | ICD-10-CM

## 2022-08-19 DIAGNOSIS — I639 Cerebral infarction, unspecified: Secondary | ICD-10-CM | POA: Diagnosis not present

## 2022-08-19 DIAGNOSIS — R471 Dysarthria and anarthria: Secondary | ICD-10-CM

## 2022-08-19 NOTE — Therapy (Signed)
[place] [facility]  Patient Name: Gina Bailey MRN: [id number] DOB:1946-04-08, 77 y.o., female Today's Date: 08/19/2022  PCP: Angelica Pou, MD  REFERRING PROVIDER: Charlett Blake, MD  END OF SESSION:  OT End of Session - 08/19/22 1317     Visit Number 2    Number of Visits 13    Date for OT Re-Evaluation 10/02/22    Authorization Type [employer] - requires auth    Authorization Time Period 13 VISITS approved 1/29 - 10/02/22    OT Start Time 1315    OT Stop Time 1400    OT Time Calculation (min) 45 min    Activity Tolerance Patient tolerated treatment well    Behavior During Therapy [facility] for tasks assessed/performed             Past Medical History:  Diagnosis Date   Chronic kidney disease    Diabetes mellitus    Diet-controlled. Not on antiglycemic medications.   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  Based on the size, patient's age, status as a former smoker, and its discovery in a community setting, her probability of malignancy is 3% (low probability).  Based on this, I recommend follow-up with a CT scan around 05/2014, and if lesion is stable or smaller, no further follow-up is needed.  CT Chest 06/2015: Stable 45m pulmonary nodule. No   Ventral hernia    Incisional ventral hernia. S/P repair by Dr. 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 WHulen Skains (08/2006)   Past Surgical History:  Procedure Laterality Date   APPENDECTOMY     AV FISTULA PLACEMENT Left 03/10/2022   Procedure: LEFT ARM  ARTERIOVENOUS (AV) FISTULA CREATION VERSUS GRAFT;  Surgeon: CWaynetta Sandy MD;  [facility] [facility]  Service: Vascular;  Laterality: Left;   COLON SURGERY  08/2006   Partial right colectomy and anastomosis with resection of villous adenoma.   HEMORRHOID SURGERY     hemorrhoidectomy   TONSILLECTOMY     TUBAL LIGATION     VENTRAL HERNIA REPAIR  08/2008   By Dr. 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   Cataracts, bilateral 08/05/2021   CKD (chronic kidney disease) stage 5, GFR less than 15 ml/min ([place] 04/08/2021   Pancreas cysts, incidental finding, likely benign, f/u imaging in 2024 can be considered 08/26/2020   Preventative health care 09/04/2010   Diabetes mellitus type 2, diet-controlled ([place] 04/18/2009   Hyperlipidemia associated with type  2 diabetes mellitus ([place]) 06/24/2007   VILLOUS ADENOMA, COLON, HX OF 09/09/2006   Hypertension 06/04/2006    ONSET DATE: 07/16/2022   REFERRING DIAG: 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  MRI with findings of right posterior limb internal capsule as well as left tiny periventricular white matter lacunar infarcts.  No large vessel stenosis or occlusion on MR angiogram. Etiology likely secondary to chronic small vessel disease. Pt with admission to [facility] inpatient rehab center from 10/4-10/17/23.   Prior to her stroke she did not require any assistive device to ambulate.    PMH significant for T2DM with neuropathy, poorly controlled HTN, ESRD with AVF LUE, HLD, former smoker,  diastolic HF.   PRECAUTIONS: Fall  WEIGHT BEARING RESTRICTIONS: No  PAIN:  Are you having pain? Yes: NPRS scale: 1-2/10 Pain location: L leg Pain description: sore from PT Aggravating factors: exercises Relieving factors: rest  FALLS: Has patient fallen in last 6 months? No  LIVING ENVIRONMENT: Lives with: lives with their son (prior to CVA was also living with son) Lives in: House/apartment Stairs: Yes: External: 1 steps; none Has following equipment at home: Gilford Rile - 2 wheeled, Shower bench, Grab bars, and also has a bed rail    Needs help with bathing. Does not do any household cleaning or laundry.   PLOF: Independent; had stopped driving; retired Chief [employer]  PATIENT GOALS: return to PLOF  OBJECTIVE:   HAND DOMINANCE: Right  ADLs: Eating: independent Grooming: independent UB Dressing: modified independent (occasionally needs assistance to orient clothing front to back) LB Dressing: modified independent (occasionally needs assistance to orient clothing front to back) Toileting: independent Bathing: mod I Tub Shower transfers: min A Equipment: Transfer tub bench, bed side commode, and suction cup grab bars  IADLs: Shopping: dependent Light housekeeping: dependent Meal Prep: dependent Community mobility: dependent at baseline Medication management: dependent with pill box Financial management: dependent Handwriting: 100%  legible  MOBILITY STATUS: Needs Assist: min A uses RW  ACTIVITY TOLERANCE: Activity tolerance: fair  FUNCTIONAL OUTCOME MEASURES: FOTO: 61    UPPER EXTREMITY ROM:  end range limited LUE sh flexion  AROM Right (eval) Left (eval)  Shoulder flexion WNL WFL  Shoulder abduction WNL WNL  Elbow flexion WNL WNL  Elbow extension WNL WNL  Wrist flexion WNL WNL  Wrist extension WNL WNL  Wrist pronation WNL WNL  Wrist supination WNL WNL   Digit Composite Flexion WNL WNL  Digit Composite Extension WNL WNL  Digit Opposition WNL WNL  (Blank rows = not tested)  UPPER EXTREMITY MMT:     MMT Right (eval) Left (eval)  Shoulder flexion WNL WNL  Shoulder abduction WNL WNL  Elbow flexion WNL WNL  Elbow extension WNL 3+/5  (Blank rows = not tested)  HAND FUNCTION: Grip strength: Right: 40.5 lbs; Left: 44.5 lbs  COORDINATION: Box and Blocks:  Right 37blocks, Left 33blocks 08/19/22: 9 hole peg test: Rt = 38.33 sec, Lt = 45.48  SENSATION: WFL  EDEMA: none  MUSCLE TONE: LUE: Within functional limits  COGNITION: Overall cognitive status: Within functional limits for tasks assessed and  see ST eval  VISION: Subjective report: no change reported by pt, son reports she drifts to left side  Baseline vision: Wears glasses for reading only Visual history: cataracts w/ surgery  VISION ASSESSMENT: Inattention to L side  PERCEPTION: Impaired: Inattention/neglect: does not attend to left visual field  PRAXIS: WFL  OBSERVATIONS: Pt appears well kept. Uses RW with min A for mobility.    TODAY'S TREATMENT:      Assessed 9 hole peg test - see above   Pt issued visual scanning strategies and activities to do at home to encourage visual scanning, and reviewed w/ pt/son.  Pt assembled 12 pc puzzle w/ max cues required. Recommended 12 pc puzzles for home practice.     Letter cancellation (29M print size) with strategies needed included: cues to slow down, paper as line guide to cover  below and above line she was working on, looking far left, and highlighting Lt side of page and cues to look there as starting point.                                                                                                                         PATIENT EDUCATION: Education details: visual scanning strategies Person educated: Patient and Child(ren) Education method: Explanation, demo Education comprehension: son verbalized understanding, pt needs cues/reinforcement to implement  HOME EXERCISE PROGRAM: 08/19/22: visual scanning strategies   GOALS:  SHORT TERM GOALS: Target date: 09/14/2022   Patient will demonstrate LUE HEP with 25% verbal cues or less for proper execution. Baseline: Goal status: INITIAL  2.  Pt will complete light meal prep (make sandwich or bowl of cereal) with good safety and SBA. Baseline: dependent Goal status: INITIAL  3.  Patient will report at least 2 compensatory strategies for visual impairment/attention to L side without cueing. Baseline:  Goal status: in progress   LONG TERM GOALS: Target date: 10/02/2022  Patient will demonstrate updated LUE HEP with 25% verbal cues or less for proper execution. Baseline:  Goal status: INITIAL  2.  Pt will demonstrate ability to cook item at stovetop such as eggs or boxed item with distance supervision only. Baseline: dependent Goal status: INITIAL  3.  Pt will complete d/c FOTO. Baseline:  Goal status: INITIAL  4.  Pt will report no difficulty placing 1 lb item on shelf at shoulder level using LUE. Baseline: mild difficulty Goal status: INITIAL   ASSESSMENT:  CLINICAL IMPRESSION: Patient returns today for first treatment session. Pt with decreased attention overall and inattention to Lt side. Pt/son benefits from strategies to compensate for this  PERFORMANCE DEFICITS: in functional skills including ADLs, IADLs, coordination, strength, Fine motor control, mobility, endurance, vision, and UE  functional use.  IMPAIRMENTS: are limiting patient from ADLs and IADLs.   CO-MORBIDITIES: may have co-morbidities  that affects occupational performance. Patient will benefit from skilled OT to address above impairments and improve overall function.  MODIFICATION OR ASSISTANCE TO COMPLETE EVALUATION: No modification of tasks or assist necessary  to complete an evaluation.  OT OCCUPATIONAL PROFILE AND HISTORY: Problem focused assessment: Including review of records relating to presenting problem.  CLINICAL DECISION MAKING: LOW - limited treatment options, no task modification necessary  REHAB POTENTIAL: Good  EVALUATION COMPLEXITY: Low    PLAN:  OT FREQUENCY: 2x/week  OT DURATION: 6 weeks  PLANNED INTERVENTIONS: self care/ADL training, therapeutic exercise, therapeutic activity, neuromuscular re-education, patient/family education, cognitive remediation/compensation, visual/perceptual remediation/compensation, energy conservation, DME and/or AE instructions, and Re-evaluation  RECOMMENDED OTHER SERVICES: none at this time  CONSULTED AND AGREED WITH PLAN OF CARE: Patient and family member/caregiver  PLAN FOR NEXT SESSION: continue attention/visual scanning and reinforcement of strategies, issue coordination HEP for Lt hand and sh flexion ex to increase LUE high level flexion   Hans Eden, OT 08/19/2022, 1:18 PM

## 2022-08-19 NOTE — Therapy (Addendum)
OUTPATIENT PHYSICAL THERAPY NEURO TREATMENT    Patient Name: Gina Bailey MRN: [id number] DOB:14-May-1946, 77 y.o., female Today's Date: 08/19/2022   PCP: Angelica Pou, MD  REFERRING PROVIDER:  Charlett Blake, MD  END OF SESSION:  PT End of Session - 08/19/22 1234     Visit Number 5    Number of Visits 9    Date for PT Re-Evaluation 09/28/22   due to potential delay in scheduling   Authorization Type [employer] 9 visits approved 1/11- 09/09/22    PT Start Time 1231    PT Stop Time 1313    PT Time Calculation (min) 42 min    Equipment Utilized During Treatment Gait belt    Activity Tolerance Patient tolerated treatment well    Behavior During Therapy WFL for tasks assessed/performed             Past Medical History:  Diagnosis Date   Chronic kidney disease    Diabetes mellitus    Diet-controlled. Not on antiglycemic medications.   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  Based on the size, patient's age, status as a former smoker, and its discovery in a community setting, her probability of malignancy is 3% (low probability).  Based on this, I recommend follow-up with a CT scan around 05/2014, and if lesion is stable or smaller, no further follow-up is needed.  CT Chest 06/2015: Stable 73m pulmonary nodule. No   Ventral hernia    Incisional ventral hernia. S/P repair by Dr. 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 WHulen Skains (08/2006)   Past Surgical History:  Procedure Laterality Date   APPENDECTOMY     AV FISTULA PLACEMENT Left  03/10/2022   Procedure: LEFT ARM ARTERIOVENOUS (AV) FISTULA CREATION VERSUS GRAFT;  Surgeon: CWaynetta Sandy MD;  [facility]  Service: Vascular;  Laterality: Left;   COLON SURGERY  08/2006   Partial right colectomy and anastomosis with resection of villous adenoma.   HEMORRHOID SURGERY     hemorrhoidectomy   TONSILLECTOMY     TUBAL LIGATION     VENTRAL HERNIA REPAIR  08/2008   By Dr. 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, dysphagia) 04/16/2022   Anemia of chronic renal failure 04/09/2022   Gait instability with falls 04/09/2022   Heart murmur, systolic 008/67/6195  Diabetic neuropathy ([place] 03/06/2022   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   Cataracts, bilateral 08/05/2021   CKD (chronic kidney disease) stage 5, GFR less than 15 ml/min ([facility] 04/08/2021   Pancreas cysts, incidental finding, likely benign, f/u imaging in 2024 can be considered 08/26/2020   Preventative health care 09/04/2010   Diabetes mellitus type  2, diet-controlled ([place]) 04/18/2009   Hyperlipidemia associated with type 2 diabetes mellitus ([place]) 06/24/2007   VILLOUS ADENOMA, COLON, HX OF 09/09/2006   Hypertension 06/04/2006    ONSET DATE:  07/16/2022  REFERRING DIAG: I63.9 (ICD-10-CM) - Subcortical infarction ([place])  THERAPY DIAG:  Muscle weakness (generalized)  Other symptoms and signs involving the nervous system  Unsteadiness on feet  Other abnormalities of gait and mobility  Rationale for Evaluation and Treatment: Rehabilitation  SUBJECTIVE:                                                                                                                                                                                              SUBJECTIVE STATEMENT: Feels like she gets stiff if she stops moving. Feels like it is getting easier to walk around and can lift her L leg up better.   Pt accompanied by:  son [name]  PERTINENT HISTORY: Pt presented to the emergency department on 04/15/2022 complaining of left facial droop, slurred speech.  MRI with findings of right posterior limb internal capsule as well as left tiny periventricular white matter lacunar infarcts.  No large vessel stenosis or occlusion on MR angiogram. Etiology likely secondary to chronic small vessel disease. Pt with admission to [facility] inpatient rehab center from 10/4-10/17/23.  Prior to her stroke she did not require any assistive device to ambulate.   PMH significant for T2DM with neuropathy, poorly controlled HTN, ESRD with AVF LUE, HLD, former smoker,  diastolic HF.   PAIN:  Are you having pain? No  Vitals:   08/19/22 1237  BP: (!) 165/74  Pulse: 70       PRECAUTIONS: Fall  WEIGHT BEARING RESTRICTIONS: No  FALLS: Has patient fallen in last 6 months? No  LIVING ENVIRONMENT: Lives with: lives with their son (prior to CVA was also living with son) Lives in: House/apartment Stairs: Yes: External: 1 steps; none Has following equipment at home: Gilford Rile - 2 wheeled, Shower bench, Grab bars, and also has a bed rail   Needs help with bathing. Does not do any household cleaning or laundry.   PLOF: Independent  PATIENT GOALS: Be more independent   OBJECTIVE:   DIAGNOSTIC FINDINGS: On 04/27/22: IMPRESSION: 1. 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 2. Slightly increased size of recent infarct in the posterior limb of the right internal capsule. 3. Extensive chronic small vessel ischemic disease. 4. Motion degraded head MRA without evidence of a large vessel occlusion or significant proximal stenosis.  TODAY'S TREATMENT:            TRANSFERS: Assistive device  utilized: None  Sit to stand: SBA Stand to sit: SBA Performs with no UE support     GAIT: Gait pattern: step through pattern, decreased step length- Right, decreased stance time- Left, decreased stride length, decreased hip/knee flexion- Left, and decreased ankle dorsiflexion- Left Trunk lean to L Distance walked: Clinic distances Assistive device utilized: Environmental consultant - 2 wheeled Level of assistance: SBA Comments: Pt with L inattention, cued to attend to L side to avoid bumping into objects                                                                                                                       Therapeutic Exercise:  SciFit with BLE/BUE at level 3.0 for 8 minutes for strengthening, activity tolerance, ROM. Pt keeping spm in the 52s  NMR:  Standing in // bars: Standing on rockerboard in M/L direction: Weight shifting x20 reps with trying not to use UE support, pt initially more challenged going to the L Keeping board still 4 x 30 seconds, pt needing intermittent UE support and tactile cues for weight shifting, pt with more weight shifted to the R, but improved with incr reps  Keeping board still, reaching with LUE or reaching across body with RUE to grab bean bag from L side (in multi-directions) to improve inattention and then toss into crate, x20 reps. Pt needing to use intermittent UE support/min guard for balance. Cues for posture and getting balance back in the middle before tossing into crate With 4# weight on LLE, stepping over 2" obstacle x5 reps, then 4" obstacle x8 reps for improved foot clearance/hip flexor strengthening. Pt able to correct herself by lifting leg up higher when she would hit obstacle. Performed an additional 10 reps without ankle weight with improved ease of lifting leg up  Sit <> stands from mat table with focus on equal weight bearing and standing up quickly and then sitting down slowly x10 reps, cues for glute activation in standing    PATIENT  EDUCATION: Education details: Continue HEP  Person educated: Patient and pt's son Education method: Explanation Education comprehension: verbalized understanding  HOME EXERCISE PROGRAM: Access Code: [email or web address] URL: [email or web address] Date: 08/10/2022 Prepared by: Janann August  Exercises - Sit to Stand with Armchair  - 2 x daily - 4-5 x weekly - 1 sets - 10 reps - Supine Bridge  - 2 x daily - 4-5 x weekly - 1 sets - 10 reps - Small Range Straight Leg Raise  - 2 x daily - 4-5 x weekly - 1 sets - 10 reps - Standing March with Counter Support  - 2 x daily - 4-5 x weekly - 2 sets - 10 reps - Side Stepping with Counter Support  - 2 x daily - 4-5 x weekly - 3 sets  GOALS: Goals reviewed with patient? Yes  SHORT TERM GOALS: ALL STGS = LTGS DUE TO POC  LONG TERM GOALS: Target date: 08/30/2022  Pt will be independent with final HEP for strength, gait, balance in order to  build upon functional gains made in therapy. Baseline:  Goal status: INITIAL  2.  Pt will improve BERG to at least a 49/56 in order to demo decr fall risk  Baseline: 45/56 Goal status: INITIAL  3.  Pt will improve 5x sit<>stand to less than or equal to 19 sec to demonstrate improved functional strength and transfer efficiency.  Baseline: 26.53 seconds no UE support  Goal status: INITIAL  4.  Pt will improve TUG time to 20 seconds or less with LRAD in order to demo decrease fall risk. Baseline: 28.44 seconds with RW Goal status: INITIAL  5.  Pt will improve gait speed with LRAD to at least 1.8 ft/sec in order to demo improved community mobility and decr fall risk.  Baseline: 34.46 seconds with RW = .95 ft/sec Goal status: INITIAL    ASSESSMENT:  CLINICAL IMPRESSION: Today's skilled session focused on LLE>RLE strengthening, standing balance on compliant surfaces, and activity tolerance. Trialed the rockerboard today in the M/L direction for improved weight shifting and attention towards L  side. With incr reps, pt better able to utilize balance strategies to keep board in midline without use of UE support. Pt does need cues for standing tasks for posture and hip extension. Pt tolerated session well, will continue to progress towards LTGs.    OBJECTIVE IMPAIRMENTS: Abnormal gait, decreased activity tolerance, decreased balance, decreased cognition, decreased coordination, decreased mobility, decreased strength, decreased safety awareness, hypomobility, impaired tone, impaired vision/preception, and postural dysfunction.   ACTIVITY LIMITATIONS: carrying, lifting, bending, squatting, stairs, transfers, bed mobility, bathing, dressing, and locomotion level  PARTICIPATION LIMITATIONS: cleaning, laundry, driving, shopping, community activity, and yard work  PERSONAL FACTORS: Age, Behavior pattern, Past/current experiences, Time since onset of injury/illness/exacerbation, and 3+ comorbidities:  T2DM with neuropathy, poorly controlled HTN, ESRD with AVF LUE, HLD, former smoker,  diastolic HF.  are also affecting patient's functional outcome.   REHAB POTENTIAL: Good  CLINICAL DECISION MAKING: Evolving/moderate complexity  EVALUATION COMPLEXITY: Moderate  PLAN:  PT FREQUENCY: 2x/week  PT DURATION: 8 weeks  PLANNED INTERVENTIONS: Therapeutic exercises, Therapeutic activity, Neuromuscular re-education, Balance training, Gait training, Patient/Family education, Self Care, Stair training, Vestibular training, Visual/preceptual remediation/compensation, Orthotic/Fit training, DME instructions, and Re-evaluation  PLAN FOR NEXT SESSION: Try adding R hip flexor stretch. Work on standing balance, functional strength. Continue balance on compliant surfaces. Foot clearance activities. SciFit. Check BP    Arliss Journey, PT, DPT  08/19/2022, 1:17 PM

## 2022-08-19 NOTE — Patient Instructions (Signed)
Homework: Take a photos of your phone and chair remote so we can have a visual    Question: What do you need to use when you walk to the bathroom?  Answer: I need my walker

## 2022-08-19 NOTE — Therapy (Signed)
OUTPATIENT SPEECH LANGUAGE PATHOLOGY TREATMENT   Patient Name: Gina Bailey MRN: [id number] DOB:11/12/1945, 77 y.o., female Today's Date: 08/19/2022  PCP: Angelica Pou MD REFERRING PROVIDER: Charlett Blake, MD  END OF SESSION:  End of Session - 08/19/22 1243     Visit Number 2    Number of Visits 13    Date for SLP Re-Evaluation 09/10/22    Authorization Type [employer] - 12 visits apprd ( 1/11-2/22/24) - LL    SLP Start Time 0093   bathroom break   SLP Stop Time  8182    SLP Time Calculation (min) 38 min    Activity Tolerance Patient tolerated treatment well             Past Medical History:  Diagnosis Date   Chronic kidney disease    Diabetes mellitus    Diet-controlled. Not on antiglycemic medications.   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  Based on the size, patient's age, status as a former smoker, and its discovery in a community setting, her probability of malignancy is 3% (low probability).  Based on this, I recommend follow-up with a CT scan around 05/2014, and if lesion is stable or smaller, no further follow-up is needed.  CT Chest 06/2015: Stable 21m pulmonary nodule. No   Ventral hernia    Incisional ventral hernia. S/P repair by Dr. 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 WHulen Skains (08/2006)   Past Surgical History:  Procedure Laterality Date   APPENDECTOMY     AV FISTULA PLACEMENT Left 03/10/2022   Procedure: LEFT ARM ARTERIOVENOUS (AV) FISTULA CREATION VERSUS GRAFT;  Surgeon: CWaynetta Sandy MD;   [place] [facility]  Service: Vascular;  Laterality: Left;   COLON SURGERY  08/2006   Partial right colectomy and anastomosis with resection of villous adenoma.   HEMORRHOID SURGERY     hemorrhoidectomy   TONSILLECTOMY     TUBAL LIGATION     VENTRAL HERNIA REPAIR  08/2008   By Dr. 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   Cataracts, bilateral 08/05/2021   CKD (chronic kidney disease) stage 5, GFR less than 15 ml/min ([place] 04/08/2021   Pancreas cysts, incidental finding, likely benign, f/u imaging in 2024 can be considered 08/26/2020   Preventative health care 09/04/2010   Diabetes mellitus type 2, diet-controlled [place] 04/18/2009   Hyperlipidemia associated with type 2 diabetes mellitus ([place] 06/24/2007   VILLOUS ADENOMA, COLON, HX OF 09/09/2006  Hypertension 06/04/2006    ONSET DATE: 07/16/2022 (referral date)   REFERRING DIAG:  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 Additional acute infarct in the occipital lobe periventricular white matter posterior to the left  occipital horn) with admission to [facility] inpatient rehab center from 10/4-10/17/23. PMH: T2DM with neuropathy, poorly controlled HTN, ESRD with AVF LUE, HLD, former smoker, MR, diastolic HF.   PAIN: Are you having pain? No  FALLS: Has patient fallen in last 6 months?  No  PATIENT GOALS: "I don't know" - patient; "the thinking part of it" -son    OBJECTIVE:   TODAY'S TREATMENT:                                                                                                                                         08-19-22: First ST tx session after ~3 weeks since eval. Usual prompting required to engage patient in conversation. Minimal recall of recent events and PT/OT therapeutic tasks exhibited with max questioning cues. Son endorsed pt continues to have difficulty recalling to use walker at home and operating electronics (remote for chair and telephone). Used Curator (see pt instructions for targeted question/answer) to aid recall of walker. Up to 2 minutes, pt able to recall need for walker but required max fading to mod visual cues and prompts to scan to left to locate walker (set up at home despite left neglect). SLP educated plan to use structured repetitive practice and visual aids to optimize recall of pertinent information/tasks.   07-30-22: Conducted motivational interviewing to determine personally relevant goals. Pt and son verbalized understanding and agreement with SLP recommendations and POC.   PATIENT EDUCATION: Education details: see above Person educated: Patient and Child(ren) Education method: Customer service manager Education comprehension: verbalized understanding, returned demonstration, and needs further education   GOALS: Goals reviewed with patient? Yes  SHORT TERM GOALS: Target date: 08/27/2022  Pt will recall to use walker with memory compensations given occasional min A over 2 sessions  Baseline: Goal status: IN PROGRESS  2.  Pt will  accurately find bathroom/bathroom items with use of memory compensations given occasional min A for 2/2 opportunities Baseline:  Goal status: IN PROGRESS  3.  Pt will demonstrate increased orientation with use of memory aids given occasional min A over 2 sessions  Baseline:  Goal status: IN PROGRESS  4.  Pt will carryover dysarthria compensations in simple conversation over 10 minutes with occasional min A over 2 sessions  Baseline:  Goal status: IN PROGRESS   LONG TERM GOALS: Target date: 09/10/2022  Family with  report reduced need for verbal prompting to utilize walker at home over 1 week  Baseline:  Goal status: IN PROGRESS  2.  Pt will exhibit increased recall and attention to bathroom location/items with use of visual aids with reduced need for verbal prompting over 1 week  Baseline:  Goal status: IN PROGRESS  3.  Pt will carryover dysarthria compensations in simple conversation over 20 minutes with rare min A  Baseline:  Goal status: IN PROGRESS   ASSESSMENT:  CLINICAL IMPRESSION: Patient is a 77 y.o. female who was seen today for subcortical infarction in Fall 2023. Prior cognitive changes reported prior to hospitalization requiring increased assistance with iADLs (medications). Son endorsed pt now requiring increased cues and prompts to recall to use walker, locate bathroom items, and follow daily routine. Cognitive impairment contributing to reduced safety awareness and awareness of deficits. Mild dysarthria exhibited at conversation level, in which pt benefited from slow rate and over-articulation to optimize speech intelligibility on occasion. Pt and son denied dysphagia at this time. Given change in baseline, pt would benefit from skilled ST intervention to address cognitive communication skills to maximize safety and functional independence.   OBJECTIVE IMPAIRMENTS: include memory, awareness, executive functioning, and dysarthria. These impairments are limiting patient  from household responsibilities and effectively communicating at home and in community. Factors affecting potential to achieve goals and functional outcome are previous level of function. Patient will benefit from skilled SLP services to address above impairments and improve overall function.  REHAB POTENTIAL: Good  PLAN:  SLP FREQUENCY: 2x/week  SLP DURATION: 6 weeks  PLANNED INTERVENTIONS: Environmental controls, Cueing hierachy, Cognitive reorganization, Internal/external aids, Oral motor exercises, Functional tasks, Multimodal communication approach, SLP instruction and feedback, Compensatory strategies, and Patient/family education    BRIGID VANDEKAMP, CCC-SLP 08/19/2022, 3:02 PM

## 2022-08-19 NOTE — Patient Instructions (Signed)
VISUAL SCANNING STRATEGIES:   1. Look for the edge of objects (to the left and/or right) so that you make sure you are seeing all of an object 2. Turn your head when walking, scan from side to side, particularly in busy environments 3. Use an organized scanning pattern. It's usually easier to scan from top to bottom, and left to right (like you are reading) 4. Double check yourself 5. Use a line guide (like a blank piece of paper) or your finger when reading 6. If necessary, place brightly colored tape at end of table or work area as a reminder to always look until you see the tape.    Activities to try at home to encourage visual scanning:   1. Word searches 2. Mazes 3. Puzzles 4. Card games 5. Computer games and/or searches 6. Connect-the-dots  Activities for environmental (larger) scanning:  1. With supervision, scan for items in grocery store or drugstore.  Begin with a familiar store, then progress to a new store you've never been in before. Make sure you have supervision with this.

## 2022-08-20 ENCOUNTER — Encounter: Payer: Self-pay | Admitting: Physical Medicine & Rehabilitation

## 2022-08-20 ENCOUNTER — Encounter: Payer: Medicare PPO | Attending: Registered Nurse | Admitting: Physical Medicine & Rehabilitation

## 2022-08-20 VITALS — BP 166/80 | HR 58 | Ht 65.0 in | Wt 166.0 lb

## 2022-08-20 DIAGNOSIS — I69354 Hemiplegia and hemiparesis following cerebral infarction affecting left non-dominant side: Secondary | ICD-10-CM | POA: Diagnosis not present

## 2022-08-20 NOTE — Progress Notes (Signed)
Subjective:    Patient ID: Gina Bailey, female    DOB: March 02, 1946, 77 y.o.   MRN: [id number]  HPI  Pain Inventory AverPatient ID: Gina Bailey, female    DOB: [birth year], 77 y.o.   MRN: [id number] Pain Right Now 76 y.o. female who presented to the emergency department on 04/15/2022 complaining of left facial droop, slurred speech.  MRI with findings of right posterior limb internal capsule as well as left tiny periventricular white matter lacunar infarcts.  No large vessel stenosis or occlusion on MR angiogram.  Neurology consulted.  Etiology likely secondary to chronic small vessel disease.  Aspirin 81 mg and clopidogrel 75 mg daily for 3 weeks with recommendations to continue aspirin alone.  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 CKD followed by [facility].   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  She has no primary concerns but still feels weak on the left side since her stroke.  She has been doing some exercising and she can move her legs voluntarily now as well as move her arm over her head.  She has completed home health therapy and is now attending outpatient PT OT and speech.  Her son lives with her.  She does base self-care and he helps with shopping as well as cooking. Denies any falls. Awaiting nephrology decision on hemodialysis, arteriovenous graft was placed  Av Pain 1 Pain Right Now 1 My pain is aching  LOCATION OF PAIN  hip  BOWEL Number of stools per week: 7 Oral laxative use No  Type of laxative . Enema or suppository use No  History of colostomy No  Incontinent No   BLADDER Pads In and out cath, frequency . Able to self cath  . Bladder incontinence No  Frequent urination Yes  Leakage with coughing No  Difficulty starting stream No  Incomplete bladder emptying No     Mobility use a walker do you drive?  no  Function retired I need assistance with the following:  bathing, meal prep, household duties, and shopping  Neuro/Psych bladder control problems trouble walking confusion  Prior Studies Any changes since last visit?  no  Physicians involved in your care Any changes since last visit?  no   Family History  Problem Relation Age of Onset   Diabetes Other        1st degree reative in 3 of her siblings   Breast cancer Mother        not sure of age   Social History   Socioeconomic History   Marital status: Widowed    Spouse name: Not on file   Number of children: Not on file   Years of education: Not on file   Highest education level: Not on file  Occupational History   Occupation: [place]    Employer: [employer]    Comment: cafeteria   Occupation: Volunteer    Comment: [employer]  Tobacco Use   Smoking status: Former    Packs/day: 0.05    Years: 6.00    Total pack years: 0.30    Types: Cigarettes    Quit date: 09/05/1999    Years since quitting: 22.9   Smokeless tobacco: Never  Substance and Sexual Activity   Alcohol use: No    Alcohol/week: 0.0 standard drinks of alcohol   Drug use: No  Sexual activity: Not Currently  Other Topics Concern   Not on file  Social History Narrative   Retired from working as a [employer].   Widowed. (since 1997). Not sexually active since.   Volunteers at [employer].   Social Determinants of Health   Financial Resource Strain: Medium Risk (05/22/2022)   Overall Financial Resource Strain (CARDIA)    Difficulty of Paying Living Expenses: Somewhat hard  Food Insecurity: No Food Insecurity (07/17/2022)   Hunger Vital Sign    Worried About Running Out of Food in the Last Year: Never true    Ran Out of Food in the Last Year: Never true  Transportation Needs: No Transportation Needs (07/17/2022)   PRAPARE - Radiographer, therapeutic (Medical): No    Lack of Transportation (Non-Medical): No  Physical Activity: Inactive (05/22/2022)   Exercise Vital Sign    Days of Exercise per Week: 0 days    Minutes of Exercise per Session: 0 min  Stress: No Stress Concern Present (05/22/2022)   [place]    Feeling of Stress : Not at all  Social Connections: Socially Isolated (05/22/2022)   Social Connection and Isolation Panel [NHANES]    Frequency of Communication with Friends and Family: Once a week    Frequency of Social Gatherings with Friends and Family: Once a week    Attends Religious Services: Never    Marine scientist or Organizations: No    Attends Archivist Meetings: Never    Marital Status: Widowed   Past Surgical History:  Procedure Laterality Date   APPENDECTOMY     AV FISTULA PLACEMENT Left 03/10/2022   Procedure: LEFT ARM ARTERIOVENOUS (AV) FISTULA CREATION VERSUS GRAFT;  Surgeon: Waynetta Sandy, MD;  Location: [place];  Service: Vascular;  Laterality: Left;   COLON SURGERY  08/2006   Partial right colectomy and anastomosis with resection of villous adenoma.   HEMORRHOID SURGERY     hemorrhoidectomy   TONSILLECTOMY     TUBAL LIGATION     VENTRAL HERNIA REPAIR  08/2008   By Dr. Hulen Skains.   Past Medical History:  Diagnosis Date   Chronic kidney disease    Diabetes mellitus    Diet-controlled. Not on antiglycemic medications.   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  Based on the size, patient's age, status as a former smoker, and its discovery in a community setting, her probability of malignancy is 3% (low probability).  Based  on this, I recommend follow-up with a CT scan around 05/2014, and if lesion is stable or smaller, no further follow-up is needed.  CT Chest 06/2015: Stable 31m pulmonary nodule. No   Ventral hernia    Incisional ventral hernia. S/P repair by Dr. 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 WHulen Skains (08/2006)   BP (!) 166/80   Pulse (!) 58   Ht '5\' 5"'$  (1.651 m)   Wt 166 lb (  75.3 kg)   LMP 10/22/1978   SpO2 98%   BMI 27.62 kg/m   Opioid Risk Score:   Fall Risk Score:  `1  Depression screen [facility] 2/9     05/22/2022   11:38 AM 05/22/2022   11:25 AM 05/12/2022    2:45 PM 04/09/2022   10:29 AM 07/03/2021   11:35 AM 03/26/2021    4:05 PM 01/23/2021    9:59 AM  Depression screen PHQ 2/9  Decreased Interest 0 0 0 0 0 0 0  Down, Depressed, Hopeless 0 0 0 0 0 0 0  PHQ - 2 Score 0 0 0 0 0 0 0  Altered sleeping 0  0  0  0  Tired, decreased energy 0  1  0  0  Change in appetite 0  1  0  0  Feeling bad or failure about yourself  0  0  0  0  Trouble concentrating 0  0  0  0  Moving slowly or fidgety/restless 0  0  0  0  Suicidal thoughts 0  0  0  0  PHQ-9 Score 0  2  0  0  Difficult doing work/chores Not difficult at all Not difficult at all Not difficult at all  Not difficult at all  Not difficult at all     Review of Systems  Musculoskeletal:  Positive for gait problem.  Psychiatric/Behavioral:  Positive for confusion.   All other systems reviewed and are negative.     Objective:   Physical Exam  General no acute distress Mood and affect are appropriate Speech without dysarthria or aphasia Cognition she has delayed responses but follows simple commands appropriately. Motor strength is 5/5 in the right deltoid, bicep, tricep, grip, hip flexor, knee extensor, ankle dorsiflexion plantarflexion 4+ in the left deltoid, bicep, tricep, grip, hip  flexor, knee extensor, ankle dorsiflexor and plantar flexor There is mild fine motor deficit with finger-nose-finger testing      Assessment & Plan:  #1.  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  She also has decreased balance.  Agree with continued outpatient PT OT and speech Neurology follow-up 2.  CKD stage V follow-up with nephrology I will see the patient back on a as needed basis since she is making a good functional recovery.

## 2022-08-23 NOTE — Therapy (Incomplete)
OUTPATIENT OCCUPATIONAL THERAPY NEURO TREAMENT  Patient Name: Gina Bailey MRN: [id number] DOB:10/23/1945, 77 y.o., female Today's Date: 08/23/2022  PCP: Gina Pou, MD  REFERRING PROVIDER: Charlett Blake, MD  END OF SESSION:    Past Medical History:  Diagnosis Date   Chronic kidney disease    Diabetes mellitus    Diet-controlled. Not on antiglycemic medications.   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  Based on the size, patient's age, status as a former smoker, and its discovery in a community setting, her probability of malignancy is 3% (low probability).  Based on this, I recommend follow-up with a CT scan around 05/2014, and if lesion is stable or smaller, no further follow-up is needed.  CT Chest 06/2015: Stable 56m pulmonary nodule. No   Ventral hernia    Incisional ventral hernia. S/P repair by Dr. 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 WHulen Skains (08/2006)   Past Surgical History:  Procedure Laterality Date   APPENDECTOMY     AV FISTULA PLACEMENT Left 03/10/2022   Procedure: LEFT ARM ARTERIOVENOUS (AV) FISTULA CREATION VERSUS GRAFT;  Surgeon: CWaynetta Sandy MD;  [place] [facility]  Service: Vascular;  Laterality: Left;   COLON SURGERY  08/2006   Partial right colectomy and anastomosis with resection of villous adenoma.   HEMORRHOID SURGERY     hemorrhoidectomy   TONSILLECTOMY     TUBAL LIGATION     VENTRAL HERNIA REPAIR  08/2008   By Dr. WHulen Skains   Patient Active Problem List   Diagnosis Date Noted   Hemiparesis of  left nondominant side as late effect of cerebral infarction ([facility] 08/20/2022   Hx of polyp of colon, villous adenoma, resected 2008 w/p partial colectomy, anastomosis 70 cm 05/22/2022   Cognitive impairment 05/22/2022   Gout due to renal impairment 04/22/2022   Dysphagia due to recent stroke    Personal history of stroke with current residual effects (L hemiparesis, dysphagia) 04/16/2022   Anemia of chronic renal failure 04/09/2022   Gait instability with falls 04/09/2022   Heart murmur, systolic 045/80/9983  Diabetic neuropathy ([facility] 03/06/2022   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   Cataracts, bilateral 08/05/2021   CKD (chronic kidney disease) stage 5, GFR less than 15 ml/min ([facility] 04/08/2021   Pancreas cysts, incidental finding, likely benign, f/u imaging in 2024 can be considered 08/26/2020   Preventative health care 09/04/2010   Diabetes mellitus type 2, diet-controlled ([facility] 04/18/2009   Hyperlipidemia associated with type 2 diabetes mellitus ([facility] 06/24/2007   VILLOUS ADENOMA, COLON, HX OF 09/09/2006   Hypertension 06/04/2006    ONSET DATE: 07/16/2022   REFERRING DIAG: 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  Rationale for Evaluation and Treatment: Rehabilitation  SUBJECTIVE:   SUBJECTIVE STATEMENT: *** My Lt hip hurts when I'm in one spot too long  Pt accompanied by: family member - son, Gina Bailey PERTINENT HISTORY: Pt presented to the emergency department on 04/15/2022 complaining of left facial  droop, slurred speech.  MRI with findings of right posterior limb internal capsule as well as left tiny periventricular white matter lacunar infarcts.  No large vessel stenosis or occlusion on MR angiogram. Etiology likely secondary to chronic small vessel disease. Pt with [facility] inpatient rehab center from 10/4-10/17/23.   Prior to her stroke she did not require any assistive device to ambulate.    PMH significant for T2DM  with neuropathy, poorly controlled HTN, ESRD with AVF LUE, HLD, former smoker,  diastolic HF.   PRECAUTIONS: Fall  WEIGHT BEARING RESTRICTIONS: No  PAIN:  Are you having pain? Yes: NPRS scale: 1-2/10 Pain location: L leg Pain description: sore from PT Aggravating factors: exercises Relieving factors: rest  FALLS: Has patient fallen in last 6 months? No  LIVING ENVIRONMENT: Lives with: lives with their son (prior to CVA was also living with son) Lives in: House/apartment Stairs: Yes: External: 1 steps; none Has following equipment at home: Gilford Rile - 2 wheeled, Shower bench, Grab bars, and also has a bed rail    Needs help with bathing. Does not do any household cleaning or laundry.   PLOF: Independent; had stopped driving; retired Chief Executive Officer  PATIENT GOALS: return to PLOF  OBJECTIVE:   HAND DOMINANCE: Right  ADLs: Eating: independent Grooming: independent UB Dressing: modified independent (occasionally needs assistance to orient clothing front to back) LB Dressing: modified independent (occasionally needs assistance to orient clothing front to back) Toileting: independent Bathing: mod I Tub Shower transfers: min A Equipment: Transfer tub bench, bed side commode, and suction cup grab bars  IADLs: Shopping: dependent Light housekeeping: dependent Meal Prep: dependent Community mobility: dependent at baseline Medication management: dependent with pill box Financial management: dependent Handwriting: 100% legible  MOBILITY STATUS: Needs Assist: min A uses RW  ACTIVITY TOLERANCE: Activity tolerance: fair  FUNCTIONAL OUTCOME MEASURES: FOTO: 61    UPPER EXTREMITY ROM:  end range limited LUE sh flexion  AROM Right (eval) Left (eval)  Shoulder flexion WNL WFL  Shoulder abduction WNL WNL  Elbow flexion WNL WNL  Elbow extension WNL WNL  Wrist flexion WNL WNL  Wrist extension WNL WNL  Wrist pronation WNL WNL  Wrist supination WNL WNL   Digit  Composite Flexion WNL WNL  Digit Composite Extension WNL WNL  Digit Opposition WNL WNL  (Blank rows = not tested)  UPPER EXTREMITY MMT:     MMT Right (eval) Left (eval)  Shoulder flexion WNL WNL  Shoulder abduction WNL WNL  Elbow flexion WNL WNL  Elbow extension WNL 3+/5  (Blank rows = not tested)  HAND FUNCTION: Grip strength: Right: 40.5 lbs; Left: 44.5 lbs  COORDINATION: Box and Blocks:  Right 37blocks, Left 33blocks 08/19/22: 9 hole peg test: Rt = 38.33 sec, Lt = 45.48  SENSATION: WFL  EDEMA: none  MUSCLE TONE: LUE: Within functional limits  COGNITION: Overall cognitive status: Within functional limits for tasks assessed and see ST eval  VISION: Subjective report: no change reported by pt, son reports she drifts to left side  Baseline vision: Wears glasses for reading only Visual history: cataracts w/ surgery  VISION ASSESSMENT: Inattention to L side  PERCEPTION: Impaired: Inattention/neglect: does not attend to left visual field  PRAXIS: WFL  OBSERVATIONS: Pt appears well kept. Uses RW with min A for mobility.    TODAY'S TREATMENT:      ***  Assessed 9 hole peg test - see above   Pt issued visual scanning strategies and activities to do at home  to encourage visual scanning, and reviewed w/ pt/son.  Pt assembled 12 pc puzzle w/ max cues required. Recommended 12 pc puzzles for home practice.     Letter cancellation (77M print size) with strategies needed included: cues to slow down, paper as line guide to cover below and above line she was working on, looking far left, and highlighting Lt side of page and cues to look there as starting point.                                                                                                                         PATIENT EDUCATION: Education details: *** visual scanning strategies Person educated: Patient and Child(ren) Education method: Explanation, demo Education comprehension: son verbalized  understanding, pt needs cues/reinforcement to implement  HOME EXERCISE PROGRAM: 08/19/22: visual scanning strategies   GOALS:  SHORT TERM GOALS: Target date: 09/14/2022   Patient will demonstrate LUE HEP with 25% verbal cues or less for proper execution. Baseline: Goal status: INITIAL  2.  Pt will complete light meal prep (make sandwich or bowl of cereal) with good safety and SBA. Baseline: dependent Goal status: INITIAL  3.  Patient will report at least 2 compensatory strategies for visual impairment/attention to L side without cueing. Baseline:  Goal status: in progress   LONG TERM GOALS: Target date: 10/02/2022  Patient will demonstrate updated LUE HEP with 25% verbal cues or less for proper execution. Baseline:  Goal status: INITIAL  2.  Pt will demonstrate ability to cook item at stovetop such as eggs or boxed item with distance supervision only. Baseline: dependent Goal status: INITIAL  3.  Pt will complete d/c FOTO. Baseline:  Goal status: INITIAL  4.  Pt will report no difficulty placing 1 lb item on shelf at shoulder level using LUE. Baseline: mild difficulty Goal status: INITIAL   ASSESSMENT:  CLINICAL IMPRESSION: *** Patient returns today for first treatment session. Pt with decreased attention overall and inattention to Lt side. Pt/son benefits from strategies to compensate for this  PERFORMANCE DEFICITS: in functional skills including ADLs, IADLs, coordination, strength, Fine motor control, mobility, endurance, vision, and UE functional use.  IMPAIRMENTS: are limiting patient from ADLs and IADLs.   CO-MORBIDITIES: may have co-morbidities  that affects occupational performance. Patient will benefit from skilled OT to address above impairments and improve overall function.  MODIFICATION OR ASSISTANCE TO COMPLETE EVALUATION: No modification of tasks or assist necessary to complete an evaluation.  OT OCCUPATIONAL PROFILE AND HISTORY: Problem focused  assessment: Including review of records relating to presenting problem.  CLINICAL DECISION MAKING: LOW - limited treatment options, no task modification necessary  REHAB POTENTIAL: Good  EVALUATION COMPLEXITY: Low    PLAN:  OT FREQUENCY: 2x/week  OT DURATION: 6 weeks  PLANNED INTERVENTIONS: self care/ADL training, therapeutic exercise, therapeutic activity, neuromuscular re-education, patient/family education, cognitive remediation/compensation, visual/perceptual remediation/compensation, energy conservation, DME and/or AE instructions, and Re-evaluation  RECOMMENDED OTHER SERVICES: none at this time  CONSULTED AND AGREED WITH PLAN  OF CARE: Patient and family member/caregiver  PLAN FOR NEXT SESSION: *** continue attention/visual scanning and reinforcement of strategies, issue coordination HEP for Lt hand and sh flexion ex to increase LUE high level flexion   Brantleigh Mifflin, OTR/L 08/23/2022, 6:50 PM

## 2022-08-24 ENCOUNTER — Ambulatory Visit: Payer: Medicare PPO | Admitting: Occupational Therapy

## 2022-08-24 ENCOUNTER — Ambulatory Visit: Payer: Medicare PPO

## 2022-08-24 ENCOUNTER — Ambulatory Visit: Payer: Medicare PPO | Admitting: Physical Therapy

## 2022-08-26 ENCOUNTER — Ambulatory Visit: Payer: Medicare PPO | Attending: Physical Medicine & Rehabilitation | Admitting: Speech Pathology

## 2022-08-26 ENCOUNTER — Ambulatory Visit: Payer: Medicare PPO

## 2022-08-26 ENCOUNTER — Ambulatory Visit: Payer: Medicare PPO | Admitting: Occupational Therapy

## 2022-08-26 ENCOUNTER — Encounter: Payer: Self-pay | Admitting: Occupational Therapy

## 2022-08-26 DIAGNOSIS — R2681 Unsteadiness on feet: Secondary | ICD-10-CM | POA: Insufficient documentation

## 2022-08-26 DIAGNOSIS — R41842 Visuospatial deficit: Secondary | ICD-10-CM

## 2022-08-26 DIAGNOSIS — R471 Dysarthria and anarthria: Secondary | ICD-10-CM | POA: Insufficient documentation

## 2022-08-26 DIAGNOSIS — R29818 Other symptoms and signs involving the nervous system: Secondary | ICD-10-CM | POA: Diagnosis not present

## 2022-08-26 DIAGNOSIS — M6281 Muscle weakness (generalized): Secondary | ICD-10-CM

## 2022-08-26 DIAGNOSIS — R2689 Other abnormalities of gait and mobility: Secondary | ICD-10-CM | POA: Insufficient documentation

## 2022-08-26 DIAGNOSIS — I639 Cerebral infarction, unspecified: Secondary | ICD-10-CM | POA: Insufficient documentation

## 2022-08-26 DIAGNOSIS — R41841 Cognitive communication deficit: Secondary | ICD-10-CM | POA: Insufficient documentation

## 2022-08-26 DIAGNOSIS — R41840 Attention and concentration deficit: Secondary | ICD-10-CM | POA: Diagnosis not present

## 2022-08-26 NOTE — Therapy (Signed)
OUTPATIENT SPEECH LANGUAGE PATHOLOGY TREATMENT   Patient Name: Gina Bailey MRN: [id number] DOB:[birth year], 77 y.o., female Today's Date: 08/26/2022  PCP: Angelica Pou MD REFERRING PROVIDER: Charlett Blake, MD  END OF SESSION:  End of Session - 08/26/22 1013     Visit Number 3    Number of Visits 13    Date for SLP Re-Evaluation 09/10/22    Authorization Type [employer] - 12 visits apprd ( 1/11-2/22/24) - LL    SLP Start Time 1015    SLP Stop Time  1100    SLP Time Calculation (min) 45 min    Activity Tolerance Patient tolerated treatment well             Past Medical History:  Diagnosis Date   Chronic kidney disease    Diabetes mellitus    Diet-controlled. Not on antiglycemic medications.   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  Based on the size, patient's age, status as a former smoker, and its discovery in a community setting, her probability of malignancy is 3% (low probability).  Based on this, I recommend follow-up with a CT scan around 05/2014, and if lesion is stable or smaller, no further follow-up is needed.  CT Chest 06/2015: Stable 38m pulmonary nodule. No   Ventral hernia    Incisional ventral hernia. S/P repair by Dr. 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 WHulen Skains (08/2006)   Past Surgical History:  Procedure Laterality Date   APPENDECTOMY     AV FISTULA PLACEMENT Left 03/10/2022   Procedure: LEFT ARM ARTERIOVENOUS (AV) FISTULA CREATION VERSUS GRAFT;  Surgeon: CWaynetta Sandy MD;  [facility] [facility]   Service: Vascular;  Laterality: Left;   COLON SURGERY  08/2006   Partial right colectomy and anastomosis with resection of villous adenoma.   HEMORRHOID SURGERY     hemorrhoidectomy   TONSILLECTOMY     TUBAL LIGATION     VENTRAL HERNIA REPAIR  08/2008   By Dr. 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 ([place] 08/20/2022   Hx of polyp of colon, villous adenoma, resected 2008 w/p partial colectomy, anastomosis 70 cm 05/22/2022   Cognitive impairment 05/22/2022   Gout due to renal impairment 04/22/2022   Dysphagia due to recent stroke    Personal history of stroke with current residual effects (L hemiparesis, dysphagia) 04/16/2022   Anemia of chronic renal failure 04/09/2022   Gait instability with falls 04/09/2022   Heart murmur, systolic 028/41/3244  Diabetic neuropathy ([facility] 03/06/2022   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   Cataracts, bilateral 08/05/2021   CKD (chronic kidney disease) stage 5, GFR less than 15 ml/min ([facility] 04/08/2021   Pancreas cysts, incidental finding, likely benign, f/u imaging in 2024 can be considered 08/26/2020   Preventative health care 09/04/2010   Diabetes mellitus type 2, diet-controlled ([facility] 04/18/2009   Hyperlipidemia associated with type 2 diabetes  mellitus ([place]) 06/24/2007   VILLOUS ADENOMA, COLON, HX OF 09/09/2006   Hypertension 06/04/2006    ONSET DATE: 07/16/2022 (referral date)   REFERRING DIAG:  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 Additional acute infarct in the occipital lobe periventricular white matter posterior to the left occipital horn) with admission to [facility] inpatient rehab center from 10/4-10/17/23. PMH: T2DM with neuropathy, poorly controlled HTN, ESRD with AVF LUE, HLD, former smoker, MR, diastolic HF.   PAIN: Are you having pain? No  FALLS: Has patient fallen in last 6 months?  No  PATIENT GOALS: "I don't know" - patient; "the thinking part of it" -son    OBJECTIVE:   TODAY'S TREATMENT:                   2-7/24: Gina Bailey reports that Gina Bailey continues to not see the briefs in one  bathroom and is brining in a new bag each time. She takes them from storage or the other bathroom. The bag of briefs are directly in front of her. We generated visual aid of bright sign which says "BRIEFS" and an arrow pointing down to place on the wall above the briefs. Gina Bailey will assess the success and report back. Gina Bailey has used the oven once without supervision and continues to forget her walker. Problem solving cards for mobility and kitchen safety - with frequent mod to max questioning cues and verbal cues, Gina Bailey Id'd unsafe situations/decisions and provided solutions for 10/10 photo cards. Safety awareness and verbal problem solving for how she would solve these problems now that she has a stroke and walker required consistent mod to max questioning and verbal cues. Gina Bailey stated if she broke some thing or dropped something would bend down to pick it up or get a dust pan and mop. When confronted with  her physical difficulties since the CVA including  balance, left side weakness and left inattention, she still required max A to verbalize she would get Gina Bailey to help. Education re: safety awareness in the moment vs hypothetical problem solving provided to her son. In the waiting room Gina Bailey safety used arm rests to stand. In this session, Gina Bailey required usual mod to max verbal cues and demonstration a nd photo card showing walker  rearing back when used for standing, Gina Bailey demonstrated correct safe sit to stand.                                                                                                                          08-19-22: First ST tx session after ~3 weeks since eval. Usual prompting required to engage patient in conversation.  Minimal recall of recent events and PT/OT therapeutic tasks exhibited with max questioning cues. Son endorsed pt continues to have difficulty recalling to use walker at home and operating electronics (remote for chair and telephone). Used Curator (see pt instructions for targeted question/answer) to aid recall of walker. Up to 2 minutes, pt able to recall need for walker but required max fading to mod visual cues and prompts to scan to left to locate walker (set up at home despite left neglect). SLP educated plan to use structured repetitive practice and visual aids to optimize recall of pertinent information/tasks.   07-30-22: Conducted motivational interviewing to determine personally relevant goals. Pt and son verbalized understanding and agreement with SLP recommendations and POC.   PATIENT EDUCATION: Education details: see above Person educated: Patient and Child(ren) Education method: Customer service manager Education comprehension: verbalized understanding, returned demonstration, and needs further education   GOALS: Goals reviewed with patient? Yes  SHORT TERM GOALS: Target date: 08/27/2022  Pt will recall to use walker with memory compensations given occasional min A over 2 sessions  Baseline: Goal status: IN PROGRESS  2.  Pt will accurately find bathroom/bathroom items with use of memory compensations given occasional min A for 2/2 opportunities Baseline:  Goal status: IN PROGRESS  3.  Pt will demonstrate increased orientation with use of memory aids given occasional min A over 2 sessions  Baseline:  Goal status: IN PROGRESS  4.  Pt will  carryover dysarthria compensations in simple conversation over 10 minutes with occasional min A over 2 sessions  Baseline:  Goal status: IN PROGRESS   LONG TERM GOALS: Target date: 09/10/2022  Family with report reduced need for verbal prompting to utilize walker at home over 1 week  Baseline:  Goal status: IN PROGRESS  2.  Pt will exhibit increased recall and attention to bathroom location/items with use of visual aids with reduced need for verbal prompting over 1 week  Baseline:  Goal status: IN PROGRESS  3.  Pt will carryover dysarthria compensations in simple conversation over 20 minutes with rare min A  Baseline:  Goal status: IN PROGRESS   ASSESSMENT:  CLINICAL IMPRESSION: Patient is a 77 y.o. female who was seen today for subcortical infarction in Fall 2023. Prior cognitive changes reported prior to hospitalization requiring increased assistance with iADLs (medications). Son endorsed pt now requiring increased cues and prompts to recall to use walker, locate bathroom items, and follow daily routine. Cognitive impairment contributing to reduced safety awareness and awareness of deficits. Mild dysarthria exhibited at conversation level, in which pt benefited from slow rate and over-articulation to optimize speech intelligibility on occasion. Pt and son denied dysphagia at this time. Given change in baseline, pt would benefit from skilled ST intervention to address cognitive communication skills to maximize safety and functional independence.   OBJECTIVE IMPAIRMENTS: include memory, awareness, executive functioning, and dysarthria. These impairments are limiting patient from household responsibilities and effectively communicating at home and in community. Factors affecting potential to achieve goals and functional outcome are previous level of function. Patient will benefit from skilled SLP services to address above impairments and improve overall function.  REHAB POTENTIAL:  Good  PLAN:  SLP FREQUENCY: 2x/week  SLP DURATION: 6 weeks  PLANNED INTERVENTIONS: Environmental controls, Cueing hierachy, Cognitive reorganization, Internal/external aids, Oral motor exercises, Functional tasks, Multimodal communication approach, SLP instruction and feedback, Compensatory strategies, and Patient/family education    Gina Bailey, CCC-SLP 08/26/2022, 11:16 AM

## 2022-08-26 NOTE — Therapy (Unsigned)
[place] [facility]  Patient Name: Gina Bailey MRN: [id number] DOB:[birth year], 77 y.o., female Today's Date: 08/19/2022  PCP: Angelica Pou, MD  REFERRING PROVIDER: Charlett Blake, MD  END OF SESSION:  OT End of Session - 08/19/22 1317     Visit Number 2    Number of Visits 13    Date for OT Re-Evaluation 10/02/22    Authorization Type [employer] - requires auth    Authorization Time Period 13 VISITS approved 1/29 - 10/02/22    OT Start Time 1315    OT Stop Time 1400    OT Time Calculation (min) 45 min    Activity Tolerance Patient tolerated treatment well    Behavior During Therapy [facility] for tasks assessed/performed             Past Medical History:  Diagnosis Date   Chronic kidney disease    Diabetes mellitus    Diet-controlled. Not on antiglycemic medications.   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  Based on the size, patient's age, status as a former smoker, and its discovery in a community setting, her probability of malignancy is 3% (low probability).  Based on this, I recommend follow-up with a CT scan around 05/2014, and if lesion is stable or smaller, no further follow-up is needed.  CT Chest 06/2015: Stable 72m pulmonary nodule. No   Ventral hernia    Incisional ventral hernia. S/P repair by Dr. 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 WHulen Skains (08/2006)   Past Surgical History:  Procedure Laterality Date   APPENDECTOMY     AV FISTULA PLACEMENT Left 03/10/2022   Procedure: LEFT ARM  ARTERIOVENOUS (AV) FISTULA CREATION VERSUS GRAFT;  Surgeon: CWaynetta Sandy MD;  [place]  Service: Vascular;  Laterality: Left;   COLON SURGERY  08/2006   Partial right colectomy and anastomosis with resection of villous adenoma.   HEMORRHOID SURGERY     hemorrhoidectomy   TONSILLECTOMY     TUBAL LIGATION     VENTRAL HERNIA REPAIR  08/2008   By Dr. 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, dysphagia) 04/16/2022   Anemia of chronic renal failure 04/09/2022   Gait instability with falls 04/09/2022   Heart murmur, systolic 078/29/5621  Diabetic neuropathy ([place] 03/06/2022   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   Cataracts, bilateral 08/05/2021   CKD (chronic kidney disease) stage 5, GFR less than 15 ml/min ([facility] 04/08/2021   Pancreas cysts, incidental finding, likely benign, f/u imaging in 2024 can be considered 08/26/2020   Preventative health care 09/04/2010   Diabetes mellitus type 2, diet-controlled ([facility] 04/18/2009   Hyperlipidemia associated with type  2 diabetes mellitus ([place]) 06/24/2007   VILLOUS ADENOMA, COLON, HX OF 09/09/2006   Hypertension 06/04/2006    ONSET DATE: 07/16/2022   REFERRING DIAG: 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  MRI with findings of right posterior limb internal capsule as well as left tiny periventricular white matter lacunar infarcts.  No large vessel stenosis or occlusion on MR angiogram. Etiology likely secondary to chronic small vessel disease. Pt with admission to [facility] inpatient rehab center from 10/4-10/17/23.   Prior to her stroke she did not require any assistive device to ambulate.    PMH significant for T2DM with neuropathy, poorly controlled HTN, ESRD with AVF LUE, HLD, former smoker,  diastolic HF.   PRECAUTIONS: Fall  WEIGHT BEARING RESTRICTIONS: No  PAIN:  Are you having pain? Yes: NPRS scale: 1-2/10 Pain location: L leg Pain description: sore from PT Aggravating factors: exercises Relieving factors: rest  FALLS: Has patient fallen in last 6 months? No  LIVING ENVIRONMENT: Lives with: lives with their son (prior to CVA was also living with son) Lives in: House/apartment Stairs: Yes: External: 1 steps; none Has following equipment at home: Gilford Rile - 2 wheeled, Shower bench, Grab bars, and also has a bed rail    Needs help with bathing. Does not do any household cleaning or laundry.   PLOF: Independent; had stopped driving; retired Chief Executive Officer  PATIENT GOALS: return to PLOF  OBJECTIVE:   HAND DOMINANCE: Right  ADLs: Eating: independent Grooming: independent UB Dressing: modified independent (occasionally needs assistance to orient clothing front to back) LB Dressing: modified independent (occasionally needs assistance to orient clothing front to back) Toileting: independent Bathing: mod I Tub Shower transfers: min A Equipment: Transfer tub bench, bed side commode, and suction cup grab bars  IADLs: Shopping: dependent Light housekeeping: dependent Meal Prep: dependent Community mobility: dependent at baseline Medication management: dependent with pill box Financial management: dependent Handwriting: 100%  legible  MOBILITY STATUS: Needs Assist: min A uses RW  ACTIVITY TOLERANCE: Activity tolerance: fair  FUNCTIONAL OUTCOME MEASURES: FOTO: 61    UPPER EXTREMITY ROM:  end range limited LUE sh flexion  AROM Right (eval) Left (eval)  Shoulder flexion WNL WFL  Shoulder abduction WNL WNL  Elbow flexion WNL WNL  Elbow extension WNL WNL  Wrist flexion WNL WNL  Wrist extension WNL WNL  Wrist pronation WNL WNL  Wrist supination WNL WNL   Digit Composite Flexion WNL WNL  Digit Composite Extension WNL WNL  Digit Opposition WNL WNL  (Blank rows = not tested)  UPPER EXTREMITY MMT:     MMT Right (eval) Left (eval)  Shoulder flexion WNL WNL  Shoulder abduction WNL WNL  Elbow flexion WNL WNL  Elbow extension WNL 3+/5  (Blank rows = not tested)  HAND FUNCTION: Grip strength: Right: 40.5 lbs; Left: 44.5 lbs  COORDINATION: Box and Blocks:  Right 37blocks, Left 33blocks 08/19/22: 9 hole peg test: Rt = 38.33 sec, Lt = 45.48  SENSATION: WFL  EDEMA: none  MUSCLE TONE: LUE: Within functional limits  COGNITION: Overall cognitive status: Within functional limits for tasks assessed and  see ST eval  VISION: Subjective report: no change reported by pt, son reports she drifts to left side  Baseline vision: Wears glasses for reading only Visual history: cataracts w/ surgery  VISION ASSESSMENT: Inattention to L side  PERCEPTION: Impaired: Inattention/neglect: does not attend to left visual field  PRAXIS: WFL  OBSERVATIONS: Pt appears well kept. Uses RW with min A for mobility.    TODAY'S TREATMENT:      Assessed 9 hole peg test - see above   Pt issued visual scanning strategies and activities to do at home to encourage visual scanning, and reviewed w/ pt/son.  Pt assembled 12 pc puzzle w/ max cues required. Recommended 12 pc puzzles for home practice.     Letter cancellation (42M print size) with strategies needed included: cues to slow down, paper as line guide to cover  below and above line she was working on, looking far left, and highlighting Lt side of page and cues to look there as starting point.                                                                                                                         PATIENT EDUCATION: Education details: visual scanning strategies Person educated: Patient and Child(ren) Education method: Explanation, demo Education comprehension: son verbalized understanding, pt needs cues/reinforcement to implement  HOME EXERCISE PROGRAM: 08/19/22: visual scanning strategies   GOALS:  SHORT TERM GOALS: Target date: 09/14/2022   Patient will demonstrate LUE HEP with 25% verbal cues or less for proper execution. Baseline: Goal status: INITIAL  2.  Pt will complete light meal prep (make sandwich or bowl of cereal) with good safety and SBA. Baseline: dependent Goal status: INITIAL  3.  Patient will report at least 2 compensatory strategies for visual impairment/attention to L side without cueing. Baseline:  Goal status: in progress   LONG TERM GOALS: Target date: 10/02/2022  Patient will demonstrate updated LUE HEP with 25% verbal cues or less for proper execution. Baseline:  Goal status: INITIAL  2.  Pt will demonstrate ability to cook item at stovetop such as eggs or boxed item with distance supervision only. Baseline: dependent Goal status: INITIAL  3.  Pt will complete d/c FOTO. Baseline:  Goal status: INITIAL  4.  Pt will report no difficulty placing 1 lb item on shelf at shoulder level using LUE. Baseline: mild difficulty Goal status: INITIAL   ASSESSMENT:  CLINICAL IMPRESSION: Patient returns today for first treatment session. Pt with decreased attention overall and inattention to Lt side. Pt/son benefits from strategies to compensate for this  PERFORMANCE DEFICITS: in functional skills including ADLs, IADLs, coordination, strength, Fine motor control, mobility, endurance, vision, and UE  functional use.  IMPAIRMENTS: are limiting patient from ADLs and IADLs.   CO-MORBIDITIES: may have co-morbidities  that affects occupational performance. Patient will benefit from skilled OT to address above impairments and improve overall function.  MODIFICATION OR ASSISTANCE TO COMPLETE EVALUATION: No modification of tasks or assist necessary  to complete an evaluation.  OT OCCUPATIONAL PROFILE AND HISTORY: Problem focused assessment: Including review of records relating to presenting problem.  CLINICAL DECISION MAKING: LOW - limited treatment options, no task modification necessary  REHAB POTENTIAL: Good  EVALUATION COMPLEXITY: Low    PLAN:  OT FREQUENCY: 2x/week  OT DURATION: 6 weeks  PLANNED INTERVENTIONS: self care/ADL training, therapeutic exercise, therapeutic activity, neuromuscular re-education, patient/family education, cognitive remediation/compensation, visual/perceptual remediation/compensation, energy conservation, DME and/or AE instructions, and Re-evaluation  RECOMMENDED OTHER SERVICES: none at this time  CONSULTED AND AGREED WITH PLAN OF CARE: Patient and family member/caregiver  PLAN FOR NEXT SESSION: continue attention/visual scanning and reinforcement of strategies, issue coordination HEP for Lt hand and sh flexion ex to increase LUE high level flexion   Hans Eden, OT 08/19/2022, 1:18 PM

## 2022-08-26 NOTE — Therapy (Incomplete)
OUTPATIENT PHYSICAL THERAPY NEURO TREATMENT    Patient Name: Gina Bailey MRN: [id number] DOB:July 13, 1946, 77 y.o., female Today's Date: 08/26/2022   PCP: Gina Pou, MD  REFERRING PROVIDER:  Charlett Blake, MD  END OF SESSION:    Past Medical History:  Diagnosis Date   Chronic kidney disease    Diabetes mellitus    Diet-controlled. Not on antiglycemic medications.   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  Based on the size, patient's age, status as a former smoker, and its discovery in a community setting, her probability of malignancy is 3% (low probability).  Based on this, I recommend follow-up with a CT scan around 05/2014, and if lesion is stable or smaller, no further follow-up is needed.  CT Chest 06/2015: Stable 38m pulmonary nodule. No   Ventral hernia    Incisional ventral hernia. S/P repair by Dr. 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 WHulen Skains (08/2006)   Past Surgical History:  Procedure Laterality Date   APPENDECTOMY     AV FISTULA PLACEMENT Left 03/10/2022   Procedure: LEFT ARM ARTERIOVENOUS (AV) FISTULA CREATION VERSUS GRAFT;  Surgeon: CWaynetta Sandy MD;  [facility]  Service: Vascular;  Laterality: Left;   COLON SURGERY  08/2006   Partial right colectomy and anastomosis with resection of villous adenoma.   HEMORRHOID SURGERY     hemorrhoidectomy   TONSILLECTOMY     TUBAL LIGATION     VENTRAL HERNIA REPAIR  08/2008   By Dr. 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 08/20/2022   Hx of polyp of colon, villous adenoma, resected 2008 w/p partial colectomy, anastomosis 70 cm 05/22/2022   Cognitive impairment 05/22/2022   Gout due to renal impairment 04/22/2022   Dysphagia due to recent stroke    Personal history of stroke with current residual effects (L hemiparesis, dysphagia) 04/16/2022   Anemia of chronic renal failure 04/09/2022   Gait instability with falls 04/09/2022   Heart murmur, systolic 054/65/6812  Diabetic neuropathy ([facility] 03/06/2022   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   Cataracts, bilateral 08/05/2021   CKD (chronic kidney disease) stage 5, GFR less than 15 ml/min ([facility] [place] 04/08/2021   Pancreas cysts, incidental finding, likely benign, f/u imaging in 2024 can be considered 08/26/2020   Preventative health care 09/04/2010   Diabetes mellitus type 2, diet-controlled ([facility] 04/18/2009   Hyperlipidemia associated with type 2 diabetes mellitus ([facility] 06/24/2007   VILLOUS ADENOMA, COLON, HX OF 09/09/2006   Hypertension 06/04/2006    ONSET DATE:  07/16/2022  REFERRING DIAG: I63.9 (ICD-10-CM) - Subcortical infarction ([facility]  THERAPY DIAG:  No diagnosis found.  Rationale for Evaluation and Treatment: Rehabilitation  SUBJECTIVE:  SUBJECTIVE STATEMENT: Feels like she gets stiff if she stops moving. Feels like it is getting easier to walk around and can lift her L leg up better.   Pt accompanied by:  son [name]  PERTINENT HISTORY: Pt presented to the emergency department on 04/15/2022 complaining of left facial droop, slurred speech.  MRI with findings of right posterior limb internal capsule as well as left tiny periventricular white matter lacunar infarcts.  No large vessel stenosis or  occlusion on MR angiogram. Etiology likely secondary to chronic small vessel disease. Pt with admission to [facility] inpatient rehab center from 10/4-10/17/23.  Prior to her stroke she did not require any assistive device to ambulate.   PMH significant for T2DM with neuropathy, poorly controlled HTN, ESRD with AVF LUE, HLD, former smoker,  diastolic HF.   PAIN:  Are you having pain? No  There were no vitals filed for this visit.      PRECAUTIONS: Fall  WEIGHT BEARING RESTRICTIONS: No  FALLS: Has patient fallen in last 6 months? No  LIVING ENVIRONMENT: Lives with: lives with their son (prior to CVA was also living with son) Lives in: House/apartment Stairs: Yes: External: 1 steps; none Has following equipment at home: Gilford Rile - 2 wheeled, Shower bench, Grab bars, and also has a bed rail   Needs help with bathing. Does not do any household cleaning or laundry.   PLOF: Independent  PATIENT GOALS: Be more independent   OBJECTIVE:   DIAGNOSTIC FINDINGS: On 04/27/22: IMPRESSION: 1. 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 2. Slightly increased size of recent infarct in the posterior limb of the right internal capsule. 3. Extensive chronic small vessel ischemic disease. 4. Motion degraded head MRA without evidence of a large vessel occlusion or significant proximal stenosis.  TODAY'S TREATMENT:            TRANSFERS: Assistive device utilized: None  Sit to stand: SBA Stand to sit: SBA Performs with no UE support     GAIT: Gait pattern: step through pattern, decreased step length- Right, decreased stance time- Left, decreased stride length, decreased hip/knee flexion- Left, and decreased ankle dorsiflexion- Left Trunk lean to L Distance walked: Clinic distances Assistive device utilized: Environmental consultant - 2 wheeled Level of assistance: SBA Comments: Pt with L inattention, cued to attend to L side to avoid bumping into objects                                                                                                                        Therapeutic Exercise:  SciFit with BLE/BUE at level 3.0 for 8 minutes for strengthening, activity tolerance, ROM. Pt keeping spm in the 102s  NMR:  Standing in // bars: Standing on rockerboard in M/L direction: Weight shifting x20 reps with trying not to use UE support, pt initially more challenged going to the L Keeping board still 4 x 30 seconds, pt needing intermittent UE support and tactile cues for weight shifting, pt with more  weight shifted to the R, but improved with incr reps  Keeping board still, reaching with LUE or reaching across body with RUE to grab bean bag from L side (in multi-directions) to improve inattention and then toss into crate, x20 reps. Pt needing to use intermittent UE support/min guard for balance. Cues for posture and getting balance back in the middle before tossing into crate With 4# weight on LLE, stepping over 2" obstacle x5 reps, then 4" obstacle x8 reps for improved foot clearance/hip flexor strengthening. Pt able to correct herself by lifting leg up higher when she would hit obstacle. Performed an additional 10 reps without ankle weight with improved ease of lifting leg up  Sit <> stands from mat table with focus on equal weight bearing and standing up quickly and then sitting down slowly x10 reps, cues for glute activation in standing    PATIENT EDUCATION: Education details: Continue HEP  Person educated: Patient and pt's son Education method: Explanation Education comprehension: verbalized understanding  HOME EXERCISE PROGRAM: Access Code: [email or web address] URL: [email or web address] Date: 08/10/2022 Prepared by: Janann August  Exercises - Sit to Stand with Armchair  - 2 x daily - 4-5 x weekly - 1 sets - 10 reps - Supine Bridge  - 2 x daily - 4-5 x weekly - 1 sets - 10 reps - Small Range Straight Leg Raise  - 2 x daily - 4-5 x weekly - 1 sets - 10 reps -  Standing March with Counter Support  - 2 x daily - 4-5 x weekly - 2 sets - 10 reps - Side Stepping with Counter Support  - 2 x daily - 4-5 x weekly - 3 sets  GOALS: Goals reviewed with patient? Yes  SHORT TERM GOALS: ALL STGS = LTGS DUE TO POC  LONG TERM GOALS: Target date: 08/30/2022  Pt will be independent with final HEP for strength, gait, balance in order to build upon functional gains made in therapy. Baseline:  Goal status: INITIAL  2.  Pt will improve BERG to at least a 49/56 in order to demo decr fall risk  Baseline: 45/56 Goal status: INITIAL  3.  Pt will improve 5x sit<>stand to less than or equal to 19 sec to demonstrate improved functional strength and transfer efficiency.  Baseline: 26.53 seconds no UE support  Goal status: INITIAL  4.  Pt will improve TUG time to 20 seconds or less with LRAD in order to demo decrease fall risk. Baseline: 28.44 seconds with RW Goal status: INITIAL  5.  Pt will improve gait speed with LRAD to at least 1.8 ft/sec in order to demo improved community mobility and decr fall risk.  Baseline: 34.46 seconds with RW = .95 ft/sec Goal status: INITIAL    ASSESSMENT:  CLINICAL IMPRESSION: Today's skilled session focused on LLE>RLE strengthening, standing balance on compliant surfaces, and activity tolerance. Trialed the rockerboard today in the M/L direction for improved weight shifting and attention towards L side. With incr reps, pt better able to utilize balance strategies to keep board in midline without use of UE support. Pt does need cues for standing tasks for posture and hip extension. Pt tolerated session well, will continue to progress towards LTGs.    OBJECTIVE IMPAIRMENTS: Abnormal gait, decreased activity tolerance, decreased balance, decreased cognition, decreased coordination, decreased mobility, decreased strength, decreased safety awareness, hypomobility, impaired tone, impaired vision/preception, and postural dysfunction.    ACTIVITY LIMITATIONS: carrying, lifting, bending, squatting, stairs, transfers, bed mobility, bathing, dressing, and locomotion level  PARTICIPATION  LIMITATIONS: cleaning, laundry, driving, shopping, community activity, and yard work  PERSONAL FACTORS: Age, Behavior pattern, Past/current experiences, Time since onset of injury/illness/exacerbation, and 3+ comorbidities:  T2DM with neuropathy, poorly controlled HTN, ESRD with AVF LUE, HLD, former smoker,  diastolic HF.  are also affecting patient's functional outcome.   REHAB POTENTIAL: Good  CLINICAL DECISION MAKING: Evolving/moderate complexity  EVALUATION COMPLEXITY: Moderate  PLAN:  PT FREQUENCY: 2x/week  PT DURATION: 8 weeks  PLANNED INTERVENTIONS: Therapeutic exercises, Therapeutic activity, Neuromuscular re-education, Balance training, Gait training, Patient/Family education, Self Care, Stair training, Vestibular training, Visual/preceptual remediation/compensation, Orthotic/Fit training, DME instructions, and Re-evaluation  PLAN FOR NEXT SESSION: Try adding R hip flexor stretch. Work on standing balance, functional strength. Continue balance on compliant surfaces. Foot clearance activities. SciFit. Check BP    Kerrie Pleasure, PT, DPT  08/26/2022, 9:53 AM

## 2022-08-27 ENCOUNTER — Encounter: Payer: Self-pay | Admitting: Internal Medicine

## 2022-08-27 ENCOUNTER — Ambulatory Visit: Payer: Medicare PPO | Admitting: Internal Medicine

## 2022-08-27 VITALS — BP 175/78 | HR 73 | Temp 97.6°F | Ht 65.0 in | Wt 160.8 lb

## 2022-08-27 DIAGNOSIS — E119 Type 2 diabetes mellitus without complications: Secondary | ICD-10-CM

## 2022-08-27 DIAGNOSIS — Z87891 Personal history of nicotine dependence: Secondary | ICD-10-CM

## 2022-08-27 DIAGNOSIS — I10 Essential (primary) hypertension: Secondary | ICD-10-CM

## 2022-08-27 DIAGNOSIS — N185 Chronic kidney disease, stage 5: Secondary | ICD-10-CM

## 2022-08-27 DIAGNOSIS — I6930 Unspecified sequelae of cerebral infarction: Secondary | ICD-10-CM

## 2022-08-27 DIAGNOSIS — M10379 Gout due to renal impairment, unspecified ankle and foot: Secondary | ICD-10-CM | POA: Diagnosis not present

## 2022-08-27 DIAGNOSIS — I69391 Dysphagia following cerebral infarction: Secondary | ICD-10-CM | POA: Diagnosis not present

## 2022-08-27 DIAGNOSIS — E1122 Type 2 diabetes mellitus with diabetic chronic kidney disease: Secondary | ICD-10-CM

## 2022-08-27 DIAGNOSIS — Z860100 Personal history of colon polyps, unspecified: Secondary | ICD-10-CM

## 2022-08-27 DIAGNOSIS — I69354 Hemiplegia and hemiparesis following cerebral infarction affecting left non-dominant side: Secondary | ICD-10-CM

## 2022-08-27 DIAGNOSIS — F01B0 Vascular dementia, moderate, without behavioral disturbance, psychotic disturbance, mood disturbance, and anxiety: Secondary | ICD-10-CM

## 2022-08-27 DIAGNOSIS — N3941 Urge incontinence: Secondary | ICD-10-CM | POA: Diagnosis not present

## 2022-08-27 DIAGNOSIS — E1142 Type 2 diabetes mellitus with diabetic polyneuropathy: Secondary | ICD-10-CM

## 2022-08-27 DIAGNOSIS — I120 Hypertensive chronic kidney disease with stage 5 chronic kidney disease or end stage renal disease: Secondary | ICD-10-CM

## 2022-08-27 NOTE — Assessment & Plan Note (Addendum)
Diet controlled on no medications.  A1c not obtained.  Foot exam today, no concerning findings.  Diabetic kidney evaluation not appropriate-stage V CKD anticipating HD, followed by nephrologist.

## 2022-08-27 NOTE — Assessment & Plan Note (Signed)
Upon discussion with Gina Bailey son and daughter-in-law, Gina Bailey is functionally at a moderate stage of dementia.  I will connect them with community dementia resources and refer for home care assistance.  I will send information through AVS regarding cholinesterase inhibitor therapy for their consideration, which we will discuss again at our next visit.

## 2022-08-27 NOTE — Assessment & Plan Note (Signed)
Asymptomatic except for LE edema which is managed with prn lasix per nephrologist.  Next visit with Dr. Luis Abed group is 09/2022.  No indications yet for HD.  Family wish to discuss the pros/cons of going forward with RRT given Ms. Bignell's dementia and her current good QOL.

## 2022-08-27 NOTE — Assessment & Plan Note (Signed)
No recent gout flares.  Monitor.

## 2022-08-27 NOTE — Assessment & Plan Note (Signed)
Uncontrolled on regimen stable since Fall 2023, followed by nephrology for CKD5.

## 2022-08-27 NOTE — Assessment & Plan Note (Signed)
Resolved, normal swallow eval.

## 2022-08-27 NOTE — Patient Instructions (Signed)
[employer] Meals on Wheels Info on cholinesterase inhibitors  The 36 Hour Day

## 2022-08-27 NOTE — Assessment & Plan Note (Addendum)
>>  ASSESSMENT AND PLAN FOR HEMIPARESIS OF LEFT NONDOMINANT SIDE AS LATE EFFECT OF CEREBRAL INFARCTION ([place]) WRITTEN ON 08/27/2022  8:32 PM BY Angelica Pou, MD  Greatly improved and recently released from regular PMR f/u.  Will continue with outpatient PT/OT during the Spring.   >>ASSESSMENT AND PLAN FOR PERSONAL HISTORY OF STROKE WITH CURRENT RESIDUAL EFFECTS (MILD L HEMIPARESIS) WRITTEN ON 08/27/2022  8:16 PM BY Angelica Pou, MD  She has experienced excellent progress with therapy, and was recently released from regular PMR visits, though will continue with outpatient PT OT.  Dysphagia has completely resolved.  No falls.  Continues on daily aspirin.

## 2022-08-27 NOTE — Progress Notes (Signed)
77 year old [name] (HTN; T2DM diet controlled; CKD 5 followed by Dr. Royce Macadamia; stroke in 03/2022 with mild residual left hemiparesis, recently released from PMR follow-up; other chronic conditions) who has been living with her son/primary caregiver for the past several months, is here for routine follow-up of chronic conditions.  She is accompanied today by her son, and her daughter-in-law (RN here at [facility])  Since last visit here:  Cardiology Nephrology- Mr. Gutkin recalls phosphorus being high, recommended to minimize milk/dairy (she likes cheese, doesn't drink much mild);  IV FE infusion possible soon; f/u 09/21/22 PMR (released from routine f/u by Dr. Letta Pate on 08/25/22)  Ms. Arrazola states that she has been doing well.  Under questioning, no cp/pressure, dypsea, no problems with appetite or intake.  No dysphagia (passed swallow study with flying colors).  No nausea. L hip ache, bothers her a couple times a week, usually after activity.  Not interfering with walking.  Takes tylenol. Describes, as at a last visit, that "liquid boils up in her lower R side" followed by sudden urge to void and often UI. Pullups are a great expense for the family.  No diarrhea or abdominal pain.   Hx of R colectomy in 2008, anastomosis visible on 2013 colonoscopy.  Has also had ventral hernia repair 2010 (mentioned in regard to her bodily sensation of "boiling" in her R lower quadrant - peristalsis? Why would this lead to a void?  Meds reviewed: Lasix dosing, prescribed at 3 times a week, has been variable.  Family mention in private that Ms. Melamed's cognition has declined further.  She has poor memory, is not oriented, cannot manage her clothing or her bathroom activities properly, wanders about in the house.  She has not fallen.  ADLs -needs assistance with bathing, incontinence management, COVID management, and sometimes with transfers.  Independent in walking and self-feeding. IADLs-dependent in all.  Exam:   BP (!) 175/78 (BP Location: Right Arm, Patient Position: Sitting, Cuff Size: Small)   Pulse 73   Temp 97.6 F (36.4 C) (Oral)   Ht '5\' 5"'$  (1.651 m)   Wt 160 lb 12.8 oz (72.9 kg)   LMP 10/22/1978   SpO2 100%   BMI 26.76 kg/m  Ms. Bustamante is nicely dressed and groomed, looking sharp in all black outfit.  She is alert and responds to questions, but offers no comments unless asked.  Language is logical, appropriate, and diction is clear.  I do not appreciate a facial droop.  Teeth with poor dentition.  Oral mucosa is moist.  Lungs clear to auscultation.  Heart RRR, with soft blowing murmur at base, sounds to be systolic (this is not new).  No carotid bruits and no JVD.  Lower extremities with 2+ edema distal to mid tib.  Feet are warm.  The left distal foot has some darkened skin discoloration where she states she dropped a can of food.  There is no tenderness at this time.  DP pulses are 1+ bilaterally (easier to feel when she is not edematous, strong when there is no edema).  Sensation is full and symmetric with monofilament testing.  There are no wounds.  She does have some callosities.  Well-fitting appropriate footwear.  Skin turgor is reduced over the trunk and upper extremities.  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 Truss son and daughter-in-law, Ms. Capote is functionally at a moderate stage of dementia.  I will connect them with community dementia resources and refer for home care assistance.  I will send information through AVS regarding cholinesterase inhibitor therapy for their consideration, which we will discuss again at our next visit.  Gout due to renal impairment No recent gout flares.  Monitor.  Personal history of stroke with current residual effects (mild L hemiparesis) She has experienced excellent progress  with therapy, and was recently released from regular PMR visits, though will continue with outpatient PT OT.  Dysphagia has completely resolved.  No falls.  Continues on daily aspirin.  VILLOUS ADENOMA, COLON, HX OF History of villous adenoma treated with partial colectomy in 2008 and rectal polyp removal in 2013 is noted.  No GI symptoms, though it appears that follow-up colonoscopy may have fallen through the cracks.  Based on our near future goals of care discussions, we may need to check with Dr. Vennie Homans about follow-up.  Life expectancy less than 5 years will probably not warrant the risk of another procedure at this time.  I will bring up with the family at next visit.  Hx of Type 2 diabetes mellitus without complication, with no history of insulin use (HCC) Diet controlled on no medications.  A1c not obtained.  Foot exam today, no concerning findings.  Diabetic kidney evaluation not appropriate-stage V CKD anticipating HD, followed by nephrologist.  Dysphagia due to recent stroke Resolved, normal swallow eval.    Diabetic neuropathy (HCC) No decreased sensation detected on monofilament testing today.  Neuropathy had been noted on a prior podiatry visit.  No discomfort.    Hemiparesis of left nondominant side as late effect of cerebral infarction ([facility]) >>ASSESSMENT AND PLAN FOR HEMIPARESIS OF LEFT NONDOMINANT SIDE AS LATE EFFECT OF CEREBRAL INFARCTION ([place]) WRITTEN ON 08/27/2022  8:32 PM BY Angelica Pou, MD  Greatly improved and recently released from regular PMR f/u.  Will continue with outpatient PT/OT during the Spring.   >>ASSESSMENT AND PLAN FOR PERSONAL HISTORY OF STROKE WITH CURRENT RESIDUAL EFFECTS (MILD L HEMIPARESIS) WRITTEN ON 08/27/2022  8:16 PM BY Angelica Pou, MD  She has experienced excellent progress with therapy, and was recently released from regular PMR visits, though will continue with outpatient PT OT.  Dysphagia has completely resolved.  No falls.   Continues on daily aspirin.  CKD (chronic kidney disease) stage 5, GFR less than 15 ml/min ([facility]) Asymptomatic except for LE edema which is managed with prn lasix per nephrologist.  Next visit with Dr. Luis Abed group is 09/2022.  No indications yet for HD.  Family wish to discuss the pros/cons of going forward with RRT given Ms. Seifried's dementia and her current good QOL.    Urinary incontinence, urge Daily incontinence is costly (pullups expensive).  Mixed urge and functional (she cannot move quickly to bathroom).  Will investigate insurance coverage for incontinence supplies.  Hypertension Uncontrolled on regimen stable since Fall 2023, followed by nephrology for CKD5.

## 2022-08-27 NOTE — Assessment & Plan Note (Signed)
She has experienced excellent progress with therapy, and was recently released from regular PMR visits, though will continue with outpatient PT OT.  Dysphagia has completely resolved.  No falls.  Continues on daily aspirin.

## 2022-08-27 NOTE — Assessment & Plan Note (Signed)
Daily incontinence is costly (pullups expensive).  Mixed urge and functional (she cannot move quickly to bathroom).  Will investigate insurance coverage for incontinence supplies.

## 2022-08-27 NOTE — Assessment & Plan Note (Addendum)
History of villous adenoma treated with partial colectomy in 2008 and rectal polyp removal in 2013 is noted.  No GI symptoms, though it appears that follow-up colonoscopy may have fallen through the cracks.  Based on our near future goals of care discussions, we may need to check with Dr. Vennie Homans about follow-up.  Life expectancy less than 5 years will probably not warrant the risk of another procedure at this time.  I will bring up with the family at next visit.

## 2022-08-27 NOTE — Therapy (Signed)
Pt arrived to clinic but left before being seen by OT.

## 2022-08-27 NOTE — Assessment & Plan Note (Signed)
No decreased sensation detected on monofilament testing today.  Neuropathy had been noted on a prior podiatry visit.  No discomfort.

## 2022-08-31 ENCOUNTER — Encounter: Payer: Self-pay | Admitting: Physical Therapy

## 2022-08-31 ENCOUNTER — Ambulatory Visit: Payer: Medicare PPO | Admitting: Physical Therapy

## 2022-08-31 ENCOUNTER — Ambulatory Visit: Payer: Medicare PPO

## 2022-08-31 ENCOUNTER — Ambulatory Visit: Payer: Medicare PPO | Admitting: Occupational Therapy

## 2022-08-31 DIAGNOSIS — R471 Dysarthria and anarthria: Secondary | ICD-10-CM | POA: Diagnosis not present

## 2022-08-31 DIAGNOSIS — R29818 Other symptoms and signs involving the nervous system: Secondary | ICD-10-CM

## 2022-08-31 DIAGNOSIS — I639 Cerebral infarction, unspecified: Secondary | ICD-10-CM | POA: Diagnosis not present

## 2022-08-31 DIAGNOSIS — R41841 Cognitive communication deficit: Secondary | ICD-10-CM

## 2022-08-31 DIAGNOSIS — M6281 Muscle weakness (generalized): Secondary | ICD-10-CM | POA: Diagnosis not present

## 2022-08-31 DIAGNOSIS — R2681 Unsteadiness on feet: Secondary | ICD-10-CM | POA: Diagnosis not present

## 2022-08-31 DIAGNOSIS — R2689 Other abnormalities of gait and mobility: Secondary | ICD-10-CM

## 2022-08-31 DIAGNOSIS — R41840 Attention and concentration deficit: Secondary | ICD-10-CM | POA: Diagnosis not present

## 2022-08-31 DIAGNOSIS — R41842 Visuospatial deficit: Secondary | ICD-10-CM | POA: Diagnosis not present

## 2022-08-31 NOTE — Therapy (Signed)
OUTPATIENT SPEECH LANGUAGE PATHOLOGY TREATMENT   Patient Name: Gina Bailey MRN: [id number] DOB:[birth year], 77 y.o., female Today's Date: 08/31/2022  PCP: Gina Pou MD REFERRING PROVIDER: Charlett Blake, MD  END OF SESSION:  End of Session - 08/31/22 1017     Visit Number 4    Number of Visits 13    Date for SLP Re-Evaluation 09/10/22    Authorization Type [employer] - 12 visits apprd ( 1/11-2/22/24) - LL    SLP Start Time 1017    SLP Stop Time  1100    SLP Time Calculation (min) 43 min    Activity Tolerance Patient tolerated treatment well              Past Medical History:  Diagnosis Date   Chronic kidney disease    Diabetes mellitus    Diet-controlled. Not on antiglycemic medications.   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  Based on the size, patient's age, status as a former smoker, and its discovery in a community setting, her probability of malignancy is 3% (low probability).  Based on this, I recommend follow-up with a CT scan around 05/2014, and if lesion is stable or smaller, no further follow-up is needed.  CT Chest 06/2015: Stable 43m pulmonary nodule. No   Ventral hernia    Incisional ventral hernia. S/P repair by Dr. 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 WHulen Skains (08/2006)   Past Surgical History:  Procedure Laterality Date   APPENDECTOMY     AV FISTULA PLACEMENT Left 03/10/2022   Procedure: LEFT ARM ARTERIOVENOUS (AV) FISTULA CREATION VERSUS GRAFT;  Surgeon: CWaynetta Sandy MD;  [facility]   Service: Vascular;  Laterality: Left;   HEMORRHOID SURGERY     hemorrhoidectomy   RIGHT COLECTOMY Right 08/20/2006   Partial right colectomy and anastomosis with resection of villous adenoma.   TONSILLECTOMY     TUBAL LIGATION     VENTRAL HERNIA REPAIR  08/20/2008   By Dr. 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, psychotic disturbance, mood disturbance, or anxiety ([facility] [place] 05/22/2022   Gout due to renal impairment 04/22/2022   Hemiparesis of left nondominant side as late effect of cerebral infarction ([facility] [place] 04/16/2022   Anemia of chronic renal failure 04/09/2022   Gait instability with hx of falls 04/09/2022   Heart murmur, systolic [id number]  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   Cataracts, bilateral 08/05/2021   CKD (chronic kidney disease) stage 5, GFR less than 15 ml/min ([facility] 04/08/2021   Pancreas cysts, incidental finding, likely benign, f/u imaging in 2024 can be considered 08/26/2020   Chronic left hip pain 09/08/2011   Preventative health care 09/04/2010   Hx of Type 2 diabetes mellitus without complication, with no history of insulin use ([facility] 04/18/2009   Hyperlipidemia associated with type 2 diabetes mellitus ([facility] 06/24/2007   VILLOUS ADENOMA, COLON,  HX OF 09/09/2006   Hypertension 06/04/2006    ONSET DATE: 07/16/2022 (referral date)   REFERRING DIAG:  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 Additional acute infarct in  the occipital lobe periventricular white matter posterior to the left occipital horn) with admission to [facility] inpatient rehab center from 10/4-10/17/23. PMH: T2DM with neuropathy, poorly controlled HTN, ESRD with AVF LUE, HLD, former smoker, MR, diastolic HF.   PAIN: Are you having pain? No  FALLS: Has patient fallen in last 6 months?  No  PATIENT GOALS: "I don't know" - patient; "the thinking part of it" -son    OBJECTIVE:   TODAY'S TREATMENT:     08/31/22: Per chart review, pt recently diagnosed with dementia (confirmed by son in session), which correlates to SLP observations including overt impaired recall and reduced awareness. Some success reported with visual aids for recalling location of briefs and walker at home. Less frequent reminders needed per son. Generated additional visual memory aids to assist with recall per family identified goals. SLP created another brief sign for second bathroom and written instructions to answer phone. Good recall of how to answer phone exhibited across trials using written aid. Recommended initiation of memory book to aid recall of pertinent information given new diagnosis of dementia and pt required overt extended time to name family members this session. Examples provided of information that could be included. Pt and son verbalized understanding.   08/26/22: Gina Bailey reports that Gina Bailey continues to not see the briefs in one  bathroom and is brining in a new bag each time. She takes them from storage or the other bathroom. The bag of briefs are directly in front of her. We generated visual aid of bright sign which says "BRIEFS" and an arrow pointing down to place on the wall above the briefs. Gina Bailey will assess the success and report back. Gina Bailey has used the oven once without supervision and continues to forget her walker. Problem solving cards for mobility and kitchen safety - with frequent mod to max questioning cues and verbal cues, Gina Bailey Id'd unsafe  situations/decisions and provided solutions for 10/10 photo cards. Safety awareness and verbal problem solving for how she would solve these problems now that she has a stroke and walker required consistent mod to max questioning and verbal cues. Haani stated if she broke some thing or dropped something would bend down to pick it up or get a dust pan and mop. When confronted with  her physical difficulties since the CVA including  balance, left side weakness and left inattention, she still required max A to verbalize she would get Gina Bailey to help. Education re: safety awareness in the moment vs hypothetical problem solving provided to her son. In the waiting room Matty safety used arm rests to stand. In this session, Lennyn required usual mod to max verbal cues and demonstration a nd photo card showing walker rearing back when used for standing, Marquitia demonstrated correct safe sit to stand.  08-19-22: First ST tx session after ~3 weeks since eval. Usual prompting required to engage patient in conversation. Minimal recall of recent events and PT/OT therapeutic tasks exhibited with max questioning cues. Son endorsed pt continues to have difficulty recalling to use walker at home and operating electronics (remote for chair and telephone). Used Curator (see pt instructions for targeted question/answer) to aid recall of walker. Up to 2 minutes, pt able to recall need for walker but required max fading to mod visual cues and prompts to scan to left to locate walker (set up at home despite left neglect). SLP educated plan to use structured repetitive practice and visual aids to optimize recall of pertinent information/tasks.   07-30-22: Conducted motivational interviewing to determine personally relevant goals. Pt and son verbalized understanding and agreement with SLP recommendations and  POC.   PATIENT EDUCATION: Education details: see above Person educated: Patient and Child(ren) Education method: Customer service manager Education comprehension: verbalized understanding, returned demonstration, and needs further education   GOALS: Goals reviewed with patient? Yes  SHORT TERM GOALS: Target date: 08/27/2022 (only 2 tx sessions completed)  Pt will recall to use walker with memory compensations given occasional min A over 2 sessions  Baseline: Goal status: PARTIALLY MET  2.  Pt will accurately find bathroom/bathroom items with use of memory compensations given occasional min A for 2/2 opportunities Baseline:  Goal status: PARTIALLY MET  3.  Pt will demonstrate increased orientation with use of memory aids given occasional min A over 2 sessions  Baseline:  Goal status: NOT MET  4.  Pt will carryover dysarthria compensations in simple conversation over 10 minutes with occasional min A over 2 sessions  Baseline:  Goal status: NOT MET   LONG TERM GOALS: Target date: 09/10/2022  Family with report reduced need for verbal prompting to utilize walker at home over 1 week  Baseline:  Goal status: IN PROGRESS  2.  Pt will exhibit increased recall and attention to bathroom location/items with use of visual aids with reduced need for verbal prompting over 1 week  Baseline:  Goal status: IN PROGRESS  3.  Pt will carryover dysarthria compensations in simple conversation over 20 minutes with rare min A  Baseline:  Goal status: IN PROGRESS   ASSESSMENT:  CLINICAL IMPRESSION: Patient is a 77 y.o. female who was seen today for subcortical infarction in Fall 2023. Prior cognitive changes reported prior to hospitalization requiring increased assistance with iADLs (medications). Son endorsed pt now requiring increased cues and prompts to recall to use walker, locate bathroom items, and follow daily routine. Cognitive impairment contributing to reduced safety awareness  and awareness of deficits. Mild dysarthria exhibited at conversation level, in which pt benefited from slow rate and over-articulation to optimize speech intelligibility on occasion. Pt and son denied dysphagia at this time. Given change in baseline, pt would benefit from skilled ST intervention to address cognitive communication skills to maximize safety and functional independence.   OBJECTIVE IMPAIRMENTS: include memory, awareness, executive functioning, and dysarthria. These impairments are limiting patient from household responsibilities and effectively communicating at home and in community. Factors affecting potential to achieve goals and functional outcome are previous level of function. Patient will benefit from skilled SLP services to address above impairments and improve overall function.  REHAB POTENTIAL: Good  PLAN:  SLP FREQUENCY: 2x/week  SLP DURATION: 6 weeks  PLANNED INTERVENTIONS: Environmental controls, Cueing hierachy, Cognitive reorganization, Internal/external aids, Oral motor exercises, Functional tasks, Multimodal communication approach, SLP instruction and  feedback, Compensatory strategies, and Patient/family education    SAQUOIA HOBBIE, [place] 08/31/2022, 11:14 AM

## 2022-08-31 NOTE — Therapy (Signed)
OUTPATIENT PHYSICAL THERAPY NEURO TREATMENT    Patient Name: Gina Bailey MRN: [id number] DOB:07-09-1946, 77 y.o., female Today's Date: 08/31/2022   PCP: Angelica Pou, MD  REFERRING PROVIDER:  Charlett Blake, MD  END OF SESSION:  PT End of Session - 08/31/22 1103     Visit Number 6    Number of Visits 9    Date for PT Re-Evaluation 09/28/22   due to potential delay in scheduling   Authorization Type [employer] 9 visits approved 1/11- 09/09/22    PT Start Time 1101    PT Stop Time 1145    PT Time Calculation (min) 44 min    Equipment Utilized During Treatment Gait belt    Activity Tolerance Patient tolerated treatment well    Behavior During Therapy WFL for tasks assessed/performed             Past Medical History:  Diagnosis Date   Chronic kidney disease    Diabetes mellitus    Diet-controlled. Not on antiglycemic medications.   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  Based on the size, patient's age, status as a former smoker, and its discovery in a community setting, her probability of malignancy is 3% (low probability).  Based on this, I recommend follow-up with a CT scan around 05/2014, and if lesion is stable or smaller, no further follow-up is needed.  CT Chest 06/2015: Stable 54m pulmonary nodule. No   Ventral hernia    Incisional ventral hernia. S/P repair by Dr. 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 WHulen Skains (08/2006)   Past Surgical History:  Procedure Laterality Date   APPENDECTOMY     AV FISTULA PLACEMENT Left  03/10/2022   Procedure: LEFT ARM ARTERIOVENOUS (AV) FISTULA CREATION VERSUS GRAFT;  Surgeon: CWaynetta Sandy MD;  [place] [facility]  Service: Vascular;  Laterality: Left;   HEMORRHOID SURGERY     hemorrhoidectomy   RIGHT COLECTOMY Right 08/20/2006   Partial right colectomy and anastomosis with resection of villous adenoma.   TONSILLECTOMY     TUBAL LIGATION     VENTRAL HERNIA REPAIR  08/20/2008   By Dr. 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   Cataracts, bilateral 08/05/2021   CKD (chronic kidney disease) stage 5, GFR less than 15 ml/min ([facility] 04/08/2021   Pancreas cysts, incidental finding, likely benign, f/u imaging in 2024 can be considered 08/26/2020   Chronic left hip pain 09/08/2011   Preventative health care 09/04/2010   Hx of Type 2 diabetes mellitus  without complication, with no history of insulin use ([place]) 04/18/2009   Hyperlipidemia associated with type 2 diabetes mellitus ([place]) 06/24/2007   VILLOUS ADENOMA, COLON, HX OF 09/09/2006   Hypertension 06/04/2006    ONSET DATE:  07/16/2022  REFERRING DIAG: 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 Feeling a little bit stiff today due to the weather. Feels some soreness in her L hip.   Pt accompanied by:  son [name]  PERTINENT HISTORY: Pt presented to the emergency department on 04/15/2022 complaining of left facial droop, slurred speech.  MRI with findings of right posterior limb internal capsule as well as left tiny periventricular white matter lacunar infarcts.  No large vessel stenosis or occlusion on MR angiogram. Etiology likely secondary to chronic small vessel disease. Pt with admission to [facility] inpatient rehab center from 10/4-10/17/23.  Prior to her stroke she did not require any assistive device to ambulate.   PMH significant for T2DM with neuropathy, poorly controlled HTN, ESRD with AVF LUE, HLD, former smoker,  diastolic HF.   PAIN:  Are you having pain? No  There were no vitals filed for this visit.      PRECAUTIONS: Fall  WEIGHT BEARING RESTRICTIONS: No  FALLS: Has patient fallen in last 6 months? No  LIVING ENVIRONMENT: Lives with: lives with their son (prior to CVA was also living with son) Lives in: House/apartment Stairs: Yes: External: 1 steps; none Has following equipment at home: Gilford Rile - 2 wheeled, Shower bench, Grab bars, and also has a bed rail   Needs help with bathing. Does not do any household cleaning or laundry.   PLOF: Independent  PATIENT GOALS: Be more independent   OBJECTIVE:   DIAGNOSTIC FINDINGS: On 04/27/22: IMPRESSION: 1. 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 2. Slightly increased size of recent infarct in the posterior limb of the right internal capsule. 3. Extensive chronic small vessel ischemic disease. 4. Motion degraded head MRA without evidence of a large vessel occlusion or significant proximal stenosis.  TODAY'S TREATMENT:             TRANSFERS: Assistive device utilized: None  Sit to stand: SBA  Stand to sit: SBA Performs with no UE support   GAIT: Gait pattern: step through pattern, decreased step length- Right, decreased stance time- Left, decreased stride length, decreased hip/knee flexion- Left, and decreased ankle dorsiflexion- Left Trunk lean to L Distance walked: Clinic distances Assistive device utilized: Environmental consultant - 2 wheeled Level of assistance: SBA Comments: Pt with L inattention, cued to attend to L side to avoid bumping into objects                                                                                                                       Therapeutic Activity Goal Assessment: 5x sit <> stand: with no UE support: 1st attempt: 24.5 seconds, 2nd attempt: 22.5 second s TUG: 27.38 seconds with RW Gait speed: 23.31 seconds = 1.41 ft/sec with RW  Dicussed walking program for pt to perform at home for improved strength/endurance, performing 2-3 times a day for 2-3 minutes    Therapeutic Exercise: L hip flexor stretch off mat table, 2 x 45 seconds, discussed how to perform at home (from the couch as pt's bed is too high)  For L hip flexor strength: bringing leg off mat and bringing it back onto mat table x12 reps   Pt reporting hip felt better after stretching and movement.   With bed mobility from supine <> sit, pt able to perform with supervision, takes incr time. Needs cues for scooting   NMR:  With 6 blaze pods set on tap one color for improved balance, visual scanning, working on L inattention, reaction times: Standing with RUE support, working on SLS, with blaze pods in a semi-circle, performed on level ground 2 minutes: 56 hits, 2nd bout on air ex for 2 minutes: 53 hits. Pt needing seated rest break between each due to fatigue  Standing on air ex, with blaze pods set up on tables to pt's L>R for improved attention to L side, tapped with hands, performed 97 hits in 2 minutes     PATIENT  EDUCATION: Education details: Results of goals, addition of L hip flexor stretch to HEP and walking program  Person educated: Patient and pt's son Education method: Explanation, Demonstration, Verbal cues, and Handouts Education comprehension: verbalized understanding and returned demonstration  HOME EXERCISE PROGRAM: Access Code: [email or web address] URL: [email or web address] Date: 08/31/2022 Prepared by: Janann August  Program Notes Walk around the house with walker 2-3 times a day for 2-3 minutes at a time to help build up your strength and endurance! Can do a loop around the house   Exercises - Sit to Stand with Armchair  - 2 x daily - 4-5 x weekly - 1 sets - 10 reps - Supine Bridge  - 2 x daily - 4-5 x weekly - 1 sets - 10 reps - Small Range Straight Leg Raise  - 2 x daily - 4-5 x weekly - 1 sets - 10 reps - Standing March with Counter Support  - 2 x daily - 4-5 x weekly - 2 sets -  10 reps - Side Stepping with Counter Support  - 2 x daily - 4-5 x weekly - 3 sets - Modified Thomas Stretch (Mirrored)  - 1 x daily - 6 x weekly - 3 sets - 30 hold  GOALS: Goals reviewed with patient? Yes  SHORT TERM GOALS: ALL STGS = LTGS DUE TO POC  LONG TERM GOALS: Target date: 08/30/2022  Pt will be independent with final HEP for strength, gait, balance in order to build upon functional gains made in therapy. Baseline:  Goal status: INITIAL  2.  Pt will improve BERG to at least a 49/56 in order to demo decr fall risk  Baseline: 45/56 Goal status: INITIAL  3.  Pt will improve 5x sit<>stand to less than or equal to 19 sec to demonstrate improved functional strength and transfer efficiency.  Baseline: 26.53 seconds no UE support, 22.5 seconds without UE support on 08/31/22 Goal status: NOT MET  4.  Pt will improve TUG time to 20 seconds or less with LRAD in order to demo decrease fall risk. Baseline: 28.44 seconds with RW; 27.38 seconds with RW on 08/31/22 Goal status: NOT MET  5.  Pt  will improve gait speed with LRAD to at least 1.8 ft/sec in order to demo improved community mobility and decr fall risk.  Baseline: 34.46 seconds with RW = .95 ft/sec; 23.31 seconds = 1.41 ft/sec on 08/31/22 Goal status: NOT MET   UPDATED/ONGOING LTGS FOR 8 WEEK POC  LONG TERM GOALS: Target date: 09/28/2022  Pt will be independent with final HEP for strength, gait, balance in order to build upon functional gains made in therapy. Baseline:  Goal status: ON-GOING  2.  Pt will improve BERG to at least a 49/56 in order to demo decr fall risk  Baseline: 45/56 Goal status: ON-GOING  3.  Pt will improve 5x sit<>stand to less than or equal to 19 sec to demonstrate improved functional strength and transfer efficiency.  Baseline: 26.53 seconds no UE support, 22.5 seconds without UE support on 08/31/22 Goal status: ON-GOING  4.  Pt will improve TUG time to 23 seconds or less with LRAD in order to demo decrease fall risk. Baseline: 28.44 seconds with RW; 27.38 seconds with RW on 08/31/22 Goal status: REVISED  5.  Pt will improve gait speed with LRAD to at least 1.6 ft/sec in order to demo improved community mobility and decr fall risk.  Baseline: 34.46 seconds with RW = .95 ft/sec; 23.31 seconds = 1.41 ft/sec on 08/31/22 Goal status: REVISED     ASSESSMENT:  CLINICAL IMPRESSION: Today's skilled session focused on assessing pt's STGs. Pt did not meet LTGs #3-5. Pt improved 5x sit <> stand time with no UE support from 26.5 seconds > 22.5 seconds, but not quite to goal level. Pt had minimal change in her TUG with RW. Pt's gait speed improved from .95 ft/sec > 1.4 ft/sec, but not to goal level, indicating pt is at an incr risk for falls and a household ambulator. Added walking program and hip flexor stretch for HEP. Remainder of session focused on blaze pods activities for balance and visual scanning/attention to L sided environment due to inattention. LTGs updated and revised as appropriate for 8 week  POC. Will continue per POC.    OBJECTIVE IMPAIRMENTS: Abnormal gait, decreased activity tolerance, decreased balance, decreased cognition, decreased coordination, decreased mobility, decreased strength, decreased safety awareness, hypomobility, impaired tone, impaired vision/preception, and postural dysfunction.   ACTIVITY LIMITATIONS: carrying, lifting, bending, squatting, stairs, transfers, bed  mobility, bathing, dressing, and locomotion level  PARTICIPATION LIMITATIONS: cleaning, laundry, driving, shopping, community activity, and yard work  PERSONAL FACTORS: Age, Behavior pattern, Past/current experiences, Time since onset of injury/illness/exacerbation, and 3+ comorbidities:  T2DM with neuropathy, poorly controlled HTN, ESRD with AVF LUE, HLD, former smoker,  diastolic HF.  are also affecting patient's functional outcome.   REHAB POTENTIAL: Good  CLINICAL DECISION MAKING: Evolving/moderate complexity  EVALUATION COMPLEXITY: Moderate  PLAN:  PT FREQUENCY: 2x/week  PT DURATION: 8 weeks  PLANNED INTERVENTIONS: Therapeutic exercises, Therapeutic activity, Neuromuscular re-education, Balance training, Gait training, Patient/Family education, Self Care, Stair training, Vestibular training, Visual/preceptual remediation/compensation, Orthotic/Fit training, DME instructions, and Re-evaluation  PLAN FOR NEXT SESSION: Work on standing balance, functional strength. Attention to L side. Continue balance on compliant surface and decr UE support. LLE strengthening. Foot clearance activities. SciFit. Check BP    Arliss Journey, PT, DPT  08/31/2022, 11:50 AM

## 2022-09-02 ENCOUNTER — Ambulatory Visit: Payer: Medicare PPO | Admitting: Physical Therapy

## 2022-09-02 ENCOUNTER — Encounter: Payer: Self-pay | Admitting: Occupational Therapy

## 2022-09-02 ENCOUNTER — Ambulatory Visit: Payer: Medicare PPO | Admitting: Occupational Therapy

## 2022-09-02 ENCOUNTER — Ambulatory Visit: Payer: Medicare PPO

## 2022-09-02 ENCOUNTER — Encounter: Payer: Self-pay | Admitting: Physical Therapy

## 2022-09-02 DIAGNOSIS — R41841 Cognitive communication deficit: Secondary | ICD-10-CM

## 2022-09-02 DIAGNOSIS — R41840 Attention and concentration deficit: Secondary | ICD-10-CM

## 2022-09-02 DIAGNOSIS — R29818 Other symptoms and signs involving the nervous system: Secondary | ICD-10-CM

## 2022-09-02 DIAGNOSIS — R2689 Other abnormalities of gait and mobility: Secondary | ICD-10-CM

## 2022-09-02 DIAGNOSIS — M6281 Muscle weakness (generalized): Secondary | ICD-10-CM

## 2022-09-02 DIAGNOSIS — R41842 Visuospatial deficit: Secondary | ICD-10-CM | POA: Diagnosis not present

## 2022-09-02 DIAGNOSIS — R2681 Unsteadiness on feet: Secondary | ICD-10-CM | POA: Diagnosis not present

## 2022-09-02 DIAGNOSIS — R471 Dysarthria and anarthria: Secondary | ICD-10-CM

## 2022-09-02 DIAGNOSIS — I639 Cerebral infarction, unspecified: Secondary | ICD-10-CM | POA: Diagnosis not present

## 2022-09-02 NOTE — Therapy (Signed)
OUTPATIENT PHYSICAL THERAPY NEURO TREATMENT    Patient Name: VAUGHAN ETCITTY MRN: [id number] DOB:August 15, 1945, 77 y.o., female Today's Date: 09/02/2022   PCP: Angelica Pou, MD  REFERRING PROVIDER:  Charlett Blake, MD  END OF SESSION:  PT End of Session - 09/02/22 1110     Visit Number 7    Number of Visits 9    Date for PT Re-Evaluation 09/28/22   due to potential delay in scheduling   Authorization Type [employer] 9 visits approved 1/11- 09/09/22    PT Start Time 1107   PT ran over w/ pt prior.   PT Stop Time 1147    PT Time Calculation (min) 40 min    Equipment Utilized During Treatment Gait belt    Activity Tolerance Patient tolerated treatment well    Behavior During Therapy WFL for tasks assessed/performed             Past Medical History:  Diagnosis Date   Chronic kidney disease    Diabetes mellitus    Diet-controlled. Not on antiglycemic medications.   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  Based on the size, patient's age, status as a former smoker, and its discovery in a community setting, her probability of malignancy is 3% (low probability).  Based on this, I recommend follow-up with a CT scan around 05/2014, and if lesion is stable or smaller, no further follow-up is needed.  CT Chest 06/2015: Stable 72m pulmonary nodule. No   Ventral hernia    Incisional ventral hernia. S/P repair by Dr. 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 WHulen Skains (08/2006)   Past Surgical History:  Procedure Laterality Date   APPENDECTOMY      AV FISTULA PLACEMENT Left 03/10/2022   Procedure: LEFT ARM ARTERIOVENOUS (AV) FISTULA CREATION VERSUS GRAFT;  Surgeon: CWaynetta Sandy MD;  [facility]  Service: Vascular;  Laterality: Left;   HEMORRHOID SURGERY     hemorrhoidectomy   RIGHT COLECTOMY Right 08/20/2006   Partial right colectomy and anastomosis with resection of villous adenoma.   TONSILLECTOMY     TUBAL LIGATION     VENTRAL HERNIA REPAIR  08/20/2008   By Dr. 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, psychotic disturbance, mood disturbance, or anxiety ([place] 05/22/2022   Gout due to renal impairment 04/22/2022   Hemiparesis of left nondominant side as late effect of cerebral infarction ([place] 04/16/2022   Anemia of chronic renal failure 04/09/2022   Gait instability with hx of falls 04/09/2022   Heart murmur, systolic [id number]  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   Cataracts, bilateral 08/05/2021   CKD (chronic kidney disease) stage 5, GFR less than 15 ml/min ([place] 04/08/2021   Pancreas cysts, incidental finding, likely benign, f/u imaging in 2024 can be considered 08/26/2020   Chronic left hip pain 09/08/2011   Preventative health care 09/04/2010  Hx of Type 2 diabetes mellitus without complication, with no history of insulin use ([place]) 04/18/2009   Hyperlipidemia associated with type 2 diabetes mellitus ([place]) 06/24/2007   VILLOUS ADENOMA, COLON, HX OF 09/09/2006   Hypertension 06/04/2006    ONSET DATE:  07/16/2022  REFERRING DIAG: I63.9 (ICD-10-CM) - Subcortical infarction ([place])  THERAPY DIAG:  Muscle weakness (generalized)  Other symptoms and signs involving the nervous system  Unsteadiness on feet  Other abnormalities of gait and mobility  Rationale for Evaluation and Treatment: Rehabilitation  SUBJECTIVE:                                                                                                                                                                                              SUBJECTIVE STATEMENT: No changes, no falls. She presents w/ son-Jesse this visit.    Pt accompanied by:  son [name]  PERTINENT HISTORY: Pt presented to the emergency department on 04/15/2022 complaining of left facial droop, slurred speech.  MRI with findings of right posterior limb internal capsule as well as left tiny periventricular white matter lacunar infarcts.  No large vessel stenosis or occlusion on MR angiogram. Etiology likely secondary to chronic small vessel disease. Pt with admission to [facility] inpatient rehab center from 10/4-10/17/23.  Prior to her stroke she did not require any assistive device to ambulate.   PMH significant for T2DM with neuropathy, poorly controlled HTN, ESRD with AVF LUE, HLD, former smoker,  diastolic HF.   PAIN:  Are you having pain? No  There were no vitals filed for this visit.  PRECAUTIONS: Fall  WEIGHT BEARING RESTRICTIONS: No  FALLS: Has patient fallen in last 6 months? No  LIVING ENVIRONMENT: Lives with: lives with their son (prior to CVA was also living with son) Lives in: House/apartment Stairs: Yes: External: 1 steps; none Has following equipment at home: Gilford Rile - 2 wheeled, Shower bench, Grab bars, and also has a bed rail   Needs help with bathing. Does not do any household cleaning or laundry.   PLOF: Independent  PATIENT GOALS: Be more independent   OBJECTIVE:   DIAGNOSTIC FINDINGS: On 04/27/22: IMPRESSION: 1. 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 2. Slightly increased size of recent infarct in the posterior limb of the right internal capsule. 3. Extensive chronic small vessel ischemic disease. 4. Motion degraded head MRA without evidence of a large vessel occlusion or significant proximal stenosis.  TODAY'S TREATMENT:            Merrilee Jansky  [facility] PT Assessment  - 09/02/22 1111       Merrilee Jansky Balance Test  Sit to Stand Able to stand without using hands and stabilize independently    Standing Unsupported Able to stand safely 2 minutes    Sitting with Back Unsupported but Feet Supported on Floor or Stool Able to sit safely and securely 2 minutes    Stand to Sit Sits safely with minimal use of hands    Transfers Able to transfer safely, minor use of hands    Standing Unsupported with Eyes Closed Able to stand 10 seconds safely    Standing Unsupported with Feet Together Able to place feet together independently and stand 1 minute safely    From Standing, Reach Forward with Outstretched Arm Can reach confidently >25 cm (10")    From Standing Position, Pick up Object from Floor Able to pick up shoe safely and easily    From Standing Position, Turn to Look Behind Over each Shoulder Looks behind from both sides and weight shifts well    Turn 360 Degrees Able to turn 360 degrees safely but slowly    Standing Unsupported, Alternately Place Feet on Step/Stool Able to complete >2 steps/needs minimal assist    Standing Unsupported, One Foot in Front Able to take small step independently and hold 30 seconds    Standing on One Leg Tries to lift leg/unable to hold 3 seconds but remains standing independently   SLS on right 2 seconds, SLS on left 4 seconds   Total Score 46    Berg comment: 46/56 = Significant Fall Risk             Reviewed and modified HEP: -STS no UE support x8 -Supine bridge x8, pt has difficulty w/ controlled lower -Supine SLR in small range x6 each LE                                                                                                      -Modified Thomas stretch x30 seconds each LE, more tightness noted on the left -Standing march, pt requires cues for height of march, 1 inch clearance at baseline > used 4" step for target taps w/ addition to HEP for added hip flexor challenge as pt has minimal clearance w/ march -side-stepping  w/ cues for width of steps and foot position to engage hip abductor  PATIENT EDUCATION: Education details: Progress w/ BERG and additions to HEP. Person educated: Patient and pt's son Education method: Explanation, Demonstration, Verbal cues, and Handouts Education comprehension: verbalized understanding and returned demonstration  HOME EXERCISE PROGRAM: Access Code: [email or web address] URL: [email or web address] Date: 08/31/2022 Prepared by: Janann August  Program Notes Walk around the house with walker 2-3 times a day for 2-3 minutes at a time to help build up your strength and endurance! Can do a loop around the house   Exercises - Supine Bridge  - 2 x daily - 4-5 x weekly - 1 sets - 10 reps - Small Range Straight Leg Raise  - 2 x daily - 4-5 x weekly - 1 sets - 10 reps - Standing March with Counter Support  - 2 x daily - 4-5 x weekly -  2 sets - 10 reps - Side Stepping with Counter Support  - 2 x daily - 4-5 x weekly - 3 sets - Modified Thomas Stretch (Mirrored)  - 1 x daily - 6 x weekly - 3 sets - 30 seconds hold - Sit to Stand Without Arm Support  - 1 x daily - 4-5 x weekly - 2 sets - 10 reps - Forward Step Touch  - 1 x daily - 4-5 x weekly - 3 sets - 10 reps    GOALS: Goals reviewed with patient? Yes  SHORT TERM GOALS: ALL STGS = LTGS DUE TO POC  LONG TERM GOALS: Target date: 08/30/2022  Pt will be independent with final HEP for strength, gait, balance in order to build upon functional gains made in therapy. Baseline:  Goal status: INITIAL  2.  Pt will improve BERG to at least a 49/56 in order to demo decr fall risk  Baseline: 45/56; 46/56 (2/14) Goal status: NOT MET  3.  Pt will improve 5x sit<>stand to less than or equal to 19 sec to demonstrate improved functional strength and transfer efficiency.  Baseline: 26.53 seconds no UE support, 22.5 seconds without UE support on 08/31/22 Goal status: NOT MET  4.  Pt will improve TUG time to 20 seconds or less with  LRAD in order to demo decrease fall risk. Baseline: 28.44 seconds with RW; 27.38 seconds with RW on 08/31/22 Goal status: NOT MET  5.  Pt will improve gait speed with LRAD to at least 1.8 ft/sec in order to demo improved community mobility and decr fall risk.  Baseline: 34.46 seconds with RW = .95 ft/sec; 23.31 seconds = 1.41 ft/sec on 08/31/22 Goal status: NOT MET   UPDATED/ONGOING LTGS FOR 8 WEEK POC  LONG TERM GOALS: Target date: 09/28/2022  Pt will be independent with final HEP for strength, gait, balance in order to build upon functional gains made in therapy. Baseline:  Goal status: ON-GOING  2.  Pt will improve BERG to at least a 49/56 in order to demo decr fall risk  Baseline: 45/56; 46/56 (2/14) Goal status: ON-GOING  3.  Pt will improve 5x sit<>stand to less than or equal to 19 sec to demonstrate improved functional strength and transfer efficiency.  Baseline: 26.53 seconds no UE support, 22.5 seconds without UE support on 08/31/22 Goal status: ON-GOING  4.  Pt will improve TUG time to 23 seconds or less with LRAD in order to demo decrease fall risk. Baseline: 28.44 seconds with RW; 27.38 seconds with RW on 08/31/22 Goal status: REVISED  5.  Pt will improve gait speed with LRAD to at least 1.6 ft/sec in order to demo improved community mobility and decr fall risk.  Baseline: 34.46 seconds with RW = .95 ft/sec; 23.31 seconds = 1.41 ft/sec on 08/31/22 Goal status: REVISED     ASSESSMENT:  CLINICAL IMPRESSION: Assessed BERG this visit to finish assessing patient's STGs with pt improving to 46/56 which is a modest improvement from evaluation at 45/56 indicating ongoing elevated fall risk due to static imbalance.  Reviewed HEP w/ most significant change made to improve hip flexor engagement by adding a 4" target with some improvement noted in performance from generic marching.  She continues to benefit from skilled PT for generalized strengthening and advancement of balance  strategies to promote overall safety w/ movement.    OBJECTIVE IMPAIRMENTS: Abnormal gait, decreased activity tolerance, decreased balance, decreased cognition, decreased coordination, decreased mobility, decreased strength, decreased safety awareness, hypomobility, impaired tone,  impaired vision/preception, and postural dysfunction.   ACTIVITY LIMITATIONS: carrying, lifting, bending, squatting, stairs, transfers, bed mobility, bathing, dressing, and locomotion level  PARTICIPATION LIMITATIONS: cleaning, laundry, driving, shopping, community activity, and yard work  PERSONAL FACTORS: Age, Behavior pattern, Past/current experiences, Time since onset of injury/illness/exacerbation, and 3+ comorbidities:  T2DM with neuropathy, poorly controlled HTN, ESRD with AVF LUE, HLD, former smoker,  diastolic HF.  are also affecting patient's functional outcome.   REHAB POTENTIAL: Good  CLINICAL DECISION MAKING: Evolving/moderate complexity  EVALUATION COMPLEXITY: Moderate  PLAN:  PT FREQUENCY: 2x/week  PT DURATION: 8 weeks  PLANNED INTERVENTIONS: Therapeutic exercises, Therapeutic activity, Neuromuscular re-education, Balance training, Gait training, Patient/Family education, Self Care, Stair training, Vestibular training, Visual/preceptual remediation/compensation, Orthotic/Fit training, DME instructions, and Re-evaluation  PLAN FOR NEXT SESSION: Work on standing balance, functional strength. Attention to L side. Continue balance on compliant surface and decr UE support. LLE strengthening. Foot clearance activities. SciFit. Check BP    Bary Richard, PT, DPT  09/02/2022, 11:50 AM

## 2022-09-02 NOTE — Therapy (Signed)
OUTPATIENT SPEECH LANGUAGE PATHOLOGY TREATMENT   Patient Name: Gina Bailey MRN: [id number] DOB:1945-12-16, 77 y.o., female Today's Date: 09/02/2022  PCP: Angelica Pou MD REFERRING PROVIDER: Charlett Blake, MD  END OF SESSION:  End of Session - 09/02/22 1107     Visit Number 5    Number of Visits 13    Date for SLP Re-Evaluation 09/10/22    Authorization Type [employer] - 12 visits apprd ( 1/11-2/22/24) - LL    SLP Start Time [id number]    SLP Stop Time  1310    SLP Time Calculation (min) 40 min    Activity Tolerance Patient tolerated treatment well               Past Medical History:  Diagnosis Date   Chronic kidney disease    Diabetes mellitus    Diet-controlled. Not on antiglycemic medications.   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  Based on the size, patient's age, status as a former smoker, and its discovery in a community setting, her probability of malignancy is 3% (low probability).  Based on this, I recommend follow-up with a CT scan around 05/2014, and if lesion is stable or smaller, no further follow-up is needed.  CT Chest 06/2015: Stable 74m pulmonary nodule. No   Ventral hernia    Incisional ventral hernia. S/P repair by Dr. 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 WHulen Skains (08/2006)   Past Surgical History:  Procedure Laterality Date   APPENDECTOMY     AV FISTULA PLACEMENT Left 03/10/2022   Procedure: LEFT ARM ARTERIOVENOUS (AV) FISTULA CREATION VERSUS GRAFT;  Surgeon: CWaynetta Sandy MD;  [place]  Service: Vascular;  Laterality: Left;   HEMORRHOID SURGERY     hemorrhoidectomy   RIGHT COLECTOMY Right 08/20/2006   Partial right colectomy and anastomosis with resection of villous adenoma.   TONSILLECTOMY     TUBAL LIGATION     VENTRAL HERNIA REPAIR  08/20/2008   By Dr. 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, psychotic disturbance, mood disturbance, or anxiety ([place] 05/22/2022   Gout due to renal impairment 04/22/2022   Hemiparesis of left nondominant side as late effect of cerebral infarction ([place] 04/16/2022   Anemia of chronic renal failure 04/09/2022   Gait instability with hx of falls 04/09/2022   Heart murmur, systolic [id number]  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   Cataracts, bilateral 08/05/2021   CKD (chronic kidney disease) stage 5, GFR less than 15 ml/min ([facility] 04/08/2021   Pancreas cysts, incidental finding, likely benign, f/u imaging in 2024 can be considered 08/26/2020   Chronic left hip pain 09/08/2011   Preventative health care 09/04/2010   Hx of Type 2 diabetes mellitus without complication, with no history of insulin use ([place] 04/18/2009   Hyperlipidemia associated with type 2 diabetes mellitus ([facility] 06/24/2007   VILLOUS ADENOMA,  COLON, HX OF 09/09/2006   Hypertension 06/04/2006    ONSET DATE: 07/16/2022 (referral date)   REFERRING DIAG:  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 Additional acute infarct in the occipital lobe  periventricular white matter posterior to the left occipital horn) with admission to [facility] inpatient rehab center from 10/4-10/17/23. PMH: T2DM with neuropathy, poorly controlled HTN, ESRD with AVF LUE, HLD, former smoker, MR, diastolic HF.   PAIN: Are you having pain? No  FALLS: Has patient fallen in last 6 months?  No  PATIENT GOALS: "I don't know" - patient; "the thinking part of it" -son    OBJECTIVE:   TODAY'S TREATMENT:     09/02/22: Reported success using visual aid to recall placement of briefs. Confirmed by son. Pt continues with inconsistent use of walker. Pt able to verbalize 2 reasons why she should use the walker and 1 resulting consequence of not using walker with extended time. SLP recommended placing walker directly in front of patient to aid recall, especially given left neglect. Currently using television and newspaper for orientation, in which SLP recommended use of television as available newspaper is not always current. Trialed using calendar with usual extended time required to ID month and date. Max A required to ID upcoming appointments. Suggested simplified calendar with visual cues (marking dates) to aid recall and orientation. Outline for memory aid provided as pt exhibited difficulty recalling favorite restaurants.   08/31/22: Per chart review, pt recently diagnosed with dementia (confirmed by son in session), which correlates to SLP observations including overt impaired recall and reduced awareness. Some success reported with visual aids for recalling location of briefs and walker at home. Less frequent reminders needed per son. Generated additional visual memory aids to assist with recall per family identified goals. SLP created another brief sign for second bathroom and written instructions to answer phone. Good recall of how to answer phone exhibited across trials using written aid. Recommended initiation of memory book to aid recall of pertinent information given new  diagnosis of dementia and pt required overt extended time to name family members this session. Examples provided of information that could be included. Pt and son verbalized understanding.   08/26/22: [name] reports that Jamisen continues to not see the briefs in one  bathroom and is brining in a new bag each time. She takes them from storage or the other bathroom. The bag of briefs are directly in front of her. We generated visual aid of bright sign which says "BRIEFS" and an arrow pointing down to place on the wall above the briefs. [name] will assess the success and report back. Mariadelosang has used the oven once without supervision and continues to forget her walker. Problem solving cards for mobility and kitchen safety - with frequent mod to max questioning cues and verbal cues, Kelia Id'd unsafe situations/decisions and provided solutions for 10/10 photo cards. Safety awareness and verbal problem solving for how she would solve these problems now that she has a stroke and walker required consistent mod to max questioning and verbal cues. Jaleasa stated if she broke some thing or dropped something would bend down to pick it up or get a dust pan  and mop. When confronted with  her physical difficulties since the CVA including  balance, left side weakness and left inattention, she still required max A to verbalize she would get [name] to help. Education re: safety awareness in the moment vs hypothetical problem solving provided to her son. In the waiting room Bernida safety used arm rests to stand. In this session, Cas[place] required usual mod to max verbal cues and demonstration a nd photo card showing walker rearing back when used for standing, Kadashia demonstrated correct safe sit to stand.                                                                                                                 08-19-22: First ST tx session after ~3 weeks since eval. Usual prompting required to engage patient in conversation. Minimal recall  of recent events and PT/OT therapeutic tasks exhibited with max questioning cues. Son endorsed pt continues to have difficulty recalling to use walker at home and operating electronics (remote for chair and telephone). Used Curator (see pt instructions for targeted question/answer) to aid recall of walker. Up to 2 minutes, pt able to recall need for walker but required max fading to mod visual cues and prompts to scan to left to locate walker (set up at home despite left neglect). SLP educated plan to use structured repetitive practice and visual aids to optimize recall of pertinent information/tasks.   07-30-22: Conducted motivational interviewing to determine personally relevant goals. Pt and son verbalized understanding and agreement with SLP recommendations and POC.   PATIENT EDUCATION: Education details: see above Person educated: Patient and Child(ren) Education method: Customer service manager Education comprehension: verbalized understanding, returned demonstration, and needs further education   GOALS: Goals reviewed with patient? Yes  SHORT TERM GOALS: Target date: 08/27/2022 (only 2 tx sessions completed)  Pt will recall to use walker with memory compensations given occasional min A over 2 sessions  Baseline: Goal status: PARTIALLY MET  2.  Pt will accurately find bathroom/bathroom items with use of memory compensations given occasional min A for 2/2 opportunities Baseline:  Goal status: PARTIALLY MET  3.  Pt will demonstrate increased orientation with use of memory aids given occasional min A over 2 sessions  Baseline:  Goal status: NOT MET  4.  Pt will carryover dysarthria compensations in simple conversation over 10 minutes with occasional min A over 2 sessions  Baseline:  Goal status: NOT MET   LONG TERM GOALS: Target date: 09/10/2022  Family with report reduced need for verbal prompting to utilize walker at home over 1 week  Baseline:  Goal  status: IN PROGRESS  2.  Pt will exhibit increased recall and attention to bathroom location/items with use of visual aids with reduced need for verbal prompting over 1 week  Baseline: 09/02/22 Goal status: IN PROGRESS  3.  Pt will carryover dysarthria compensations in simple conversation over 20 minutes with rare min A  Baseline:  Goal status: IN PROGRESS   ASSESSMENT:  CLINICAL IMPRESSION: Patient  is a 77 y.o. female who was seen today for subcortical infarction in Fall 2023. Prior cognitive changes reported prior to hospitalization requiring increased assistance with iADLs (medications). Son endorsed pt now requiring some reduced cues and prompts to recall to use walker, locate bathroom items, and follow daily routine. Cognitive impairment contributing to reduced safety awareness and awareness of deficits. Mild dysarthria exhibited at conversation level, in which pt benefited from slow rate and over-articulation to optimize speech intelligibility on occasion. Pt and son denied dysphagia at this time. Given change in baseline, pt would benefit from skilled ST intervention to address cognitive communication skills to maximize safety and functional independence.   OBJECTIVE IMPAIRMENTS: include memory, awareness, executive functioning, and dysarthria. These impairments are limiting patient from household responsibilities and effectively communicating at home and in community. Factors affecting potential to achieve goals and functional outcome are previous level of function. Patient will benefit from skilled SLP services to address above impairments and improve overall function.  REHAB POTENTIAL: Good  PLAN:  SLP FREQUENCY: 2x/week  SLP DURATION: 6 weeks  PLANNED INTERVENTIONS: Environmental controls, Cueing hierachy, Cognitive reorganization, Internal/external aids, Oral motor exercises, Functional tasks, Multimodal communication approach, SLP instruction and feedback, Compensatory  strategies, and Patient/family education    YESIKA NAEGELE, CCC-SLP 09/02/2022, 12:29 PM

## 2022-09-02 NOTE — Patient Instructions (Signed)
Access Code: [email or web address] URL: [email or web address] Date: 09/02/2022 Prepared by: Elease Etienne  Program Notes Walk around the house with walker 2-3 times a day for 2-3 minutes at a time to help build up your strength and endurance! Can do a loop around the house   Exercises - Supine Bridge  - 2 x daily - 4-5 x weekly - 1 sets - 10 reps - Small Range Straight Leg Raise  - 2 x daily - 4-5 x weekly - 1 sets - 10 reps - Standing March with Counter Support  - 2 x daily - 4-5 x weekly - 2 sets - 10 reps - Side Stepping with Counter Support  - 2 x daily - 4-5 x weekly - 3 sets - Modified Thomas Stretch (Mirrored)  - 1 x daily - 6 x weekly - 3 sets - 30 seconds hold - Sit to Stand Without Arm Support  - 1 x daily - 4-5 x weekly - 2 sets - 10 reps - Forward Step Touch  - 1 x daily - 4-5 x weekly - 3 sets - 10 reps

## 2022-09-02 NOTE — Therapy (Signed)
[place] [facility]  Patient Name: Gina Bailey MRN: [id number] DOB:1946-07-13, 77 y.o., female Today's Date: 09/02/2022  PCP: Angelica Pou, MD  REFERRING PROVIDER: Charlett Blake, MD  END OF SESSION:  OT End of Session - 09/02/22 1152     Visit Number 3    Number of Visits 13    Date for OT Re-Evaluation 10/02/22    Authorization Type [employer] - requires auth    Authorization Time Period 13 VISITS approved 1/29 - 10/02/22    OT Start Time 1153    OT Stop Time 1231    OT Time Calculation (min) 38 min    Activity Tolerance Patient tolerated treatment well    Behavior During Therapy WFL for tasks assessed/performed              Past Medical History:  Diagnosis Date   Chronic kidney disease    Diabetes mellitus    Diet-controlled. Not on antiglycemic medications.   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  Based on the size, patient's age, status as a former smoker, and its discovery in a community setting, her probability of malignancy is 3% (low probability).  Based on this, I recommend follow-up with a CT scan around 05/2014, and if lesion is stable or smaller, no further follow-up is needed.  CT Chest 06/2015: Stable 61m pulmonary nodule. No   Ventral hernia    Incisional ventral hernia. S/P repair by Dr. 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 WHulen Skains (08/2006)   Past Surgical History:  Procedure Laterality Date   APPENDECTOMY     AV FISTULA PLACEMENT Left 03/10/2022   Procedure: LEFT ARM  ARTERIOVENOUS (AV) FISTULA CREATION VERSUS GRAFT;  Surgeon: CWaynetta Sandy MD;  [place]  Service: Vascular;  Laterality: Left;   HEMORRHOID SURGERY     hemorrhoidectomy   RIGHT COLECTOMY Right 08/20/2006   Partial right colectomy and anastomosis with resection of villous adenoma.   TONSILLECTOMY     TUBAL LIGATION     VENTRAL HERNIA REPAIR  08/20/2008   By Dr. 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   Cataracts, bilateral 08/05/2021   CKD (chronic kidney disease) stage 5, GFR less than 15 ml/min ([facility] 04/08/2021   Pancreas cysts, incidental finding, likely benign, f/u imaging in 2024 can be considered 08/26/2020   Chronic left hip pain 09/08/2011   Preventative health care 09/04/2010   Hx of Type 2 diabetes mellitus without complication, with no history of insulin use ([place]  04/18/2009   Hyperlipidemia associated with type 2 diabetes mellitus ([place]) 06/24/2007   VILLOUS ADENOMA, COLON, HX OF 09/09/2006   Hypertension 06/04/2006    ONSET DATE: 07/16/2022   REFERRING DIAG: I63.9 (ICD-10-CM) - Subcortical infarction  THERAPY DIAG:  Muscle weakness (generalized)  Other symptoms and signs involving the nervous system  Unsteadiness on feet  Attention and concentration deficit  Visuospatial deficit  Acute ischemic stroke [facility])  Rationale for Evaluation and Treatment: Rehabilitation  SUBJECTIVE:   SUBJECTIVE STATEMENT: She would do crossword  puzzles before. She also liked to play cards.  Pt accompanied by: family member - son, [name]  PERTINENT HISTORY: Pt presented to the emergency department on 04/15/2022 complaining of left facial droop, slurred speech.  MRI with findings of right posterior limb internal capsule as well as left tiny periventricular white matter lacunar infarcts.  No large vessel stenosis or occlusion on MR angiogram. Etiology likely secondary to chronic small vessel disease. Pt with admission to [facility] inpatient rehab center from 10/4-10/17/23.   Prior to her stroke she did not require any assistive device to ambulate.    PMH significant for T2DM with neuropathy, poorly controlled HTN, ESRD with AVF LUE, HLD, former smoker,  diastolic HF.   PRECAUTIONS: Fall  WEIGHT BEARING RESTRICTIONS: No  PAIN:  Are you having pain? No  FALLS: Has patient fallen in last 6 months? No  LIVING ENVIRONMENT: Lives with: lives with their son (prior to CVA was also living with son) Lives in: House/apartment Stairs: Yes: External: 1 steps; none Has following equipment at home: Gilford Rile - 2 wheeled, Shower bench, Grab bars, and also has a bed rail    Needs help with bathing. Does not do any household cleaning or laundry.   PLOF: Independent; had stopped driving; retired Chief Executive Officer  PATIENT GOALS: return to PLOF  OBJECTIVE:   HAND DOMINANCE: Right  ADLs: Eating: independent Grooming: independent UB Dressing: modified independent (occasionally needs assistance to orient clothing front to back) LB Dressing: modified independent (occasionally needs assistance to orient clothing front to back) Toileting: independent Bathing: mod I Tub Shower transfers: min A Equipment: Transfer tub bench, bed side commode, and suction cup grab bars  IADLs: Shopping: dependent Light housekeeping: dependent Meal Prep: dependent Community mobility: dependent at baseline Medication management: dependent with pill  box Financial management: dependent Handwriting: 100% legible  MOBILITY STATUS: Needs Assist: min A uses RW  ACTIVITY TOLERANCE: Activity tolerance: fair  FUNCTIONAL OUTCOME MEASURES: FOTO: 61    UPPER EXTREMITY ROM:  end range limited LUE sh flexion  AROM Right (eval) Left (eval)  Shoulder flexion WNL WFL  Shoulder abduction WNL WNL  Elbow flexion WNL WNL  Elbow extension WNL WNL  Wrist flexion WNL WNL  Wrist extension WNL WNL  Wrist pronation WNL WNL  Wrist supination WNL WNL   Digit Composite Flexion WNL WNL  Digit Composite Extension WNL WNL  Digit Opposition WNL WNL  (Blank rows = not tested)  UPPER EXTREMITY MMT:     MMT Right (eval) Left (eval)  Shoulder flexion WNL WNL  Shoulder abduction WNL WNL  Elbow flexion WNL WNL  Elbow extension WNL 3+/5  (Blank rows = not tested)  HAND FUNCTION: Grip strength: Right: 40.5 lbs; Left: 44.5 lbs  COORDINATION: Box and Blocks:  Right 37blocks, Left 33blocks 08/19/22: 9 hole peg test: Rt = 38.33 sec, Lt = 45.48  SENSATION: WFL  EDEMA: none  MUSCLE TONE: LUE: Within functional limits  COGNITION: Overall cognitive status: Within functional limits  for tasks assessed and see ST eval  VISION: Subjective report: no change reported by pt, son reports she drifts to left side  Baseline vision: Wears glasses for reading only Visual history: cataracts w/ surgery  VISION ASSESSMENT: Inattention to L side  PERCEPTION: Impaired: Inattention/neglect: does not attend to left visual field  PRAXIS: WFL  OBSERVATIONS: Pt appears well kept. Uses RW with min A for mobility.    TODAY'S TREATMENT:      - Therapeutic activities completed for duration as noted below including:  OT reviewed vision strategies as noted in pt instructions  Pt completed part of crossword puzzle using scanning strategies to read clues  OT initiated LUE coordination [place] as noted in patient instructions including pick up  and placement of items, shuffling and turning cards, item stacking and unstacking, rolling a piece of tissue paper into a ball, rolling golf balls in hand, rolling a pen in between fingers and thumb, picking up and storing items in hand. Patient able to return demonstration of all exercises. Handout provided. Moderate cues to use L hand                                                                                                              PATIENT EDUCATION: Education details: visual scanning strategies; Coordination HEP Person educated: Patient and Child(ren) Education method: Explanation, demo, handouts Education comprehension: son verbalized understanding, pt needs cues/reinforcement to implement  HOME EXERCISE PROGRAM: 08/19/22: visual scanning strategies 2/14: L coordination HEP  GOALS:  SHORT TERM GOALS: Target date: 09/14/2022   Patient will demonstrate LUE HEP with 25% verbal cues or less for proper execution. Baseline: Goal status: INITIAL  2.  Pt will complete light meal prep (make sandwich or bowl of cereal) with good safety and SBA. Baseline: dependent Goal status: INITIAL  3.  Patient will report at least 2 compensatory strategies for visual impairment/attention to L side without cueing. Baseline:  Goal status: in progress   LONG TERM GOALS: Target date: 10/02/2022  Patient will demonstrate updated LUE HEP with 25% verbal cues or less for proper execution. Baseline:  Goal status: INITIAL  2.  Pt will demonstrate ability to cook item at stovetop such as eggs or boxed item with distance supervision only. Baseline: dependent Goal status: INITIAL  3.  Pt will complete d/c FOTO. Baseline:  Goal status: INITIAL  4.  Pt will report no difficulty placing 1 lb item on shelf at shoulder level using LUE. Baseline: mild difficulty Goal status: INITIAL   ASSESSMENT:  CLINICAL IMPRESSION: Pt with decreased attention overall and inattention to Lt side. Pt/son  benefits from strategies to compensate for this as needed to improve independence and safety with ADL and IADL completion.   PERFORMANCE DEFICITS: in functional skills including ADLs, IADLs, coordination, strength, Fine motor control, mobility, endurance, vision, and UE functional use.  IMPAIRMENTS: are limiting patient from ADLs and IADLs.   CO-MORBIDITIES: may have co-morbidities  that affects occupational performance. Patient will benefit from skilled OT to address above impairments and  improve overall function.  REHAB POTENTIAL: Good    PLAN:  OT FREQUENCY: 2x/week  OT DURATION: 6 weeks  PLANNED INTERVENTIONS: self care/ADL training, therapeutic exercise, therapeutic activity, neuromuscular re-education, patient/family education, cognitive remediation/compensation, visual/perceptual remediation/compensation, energy conservation, DME and/or AE instructions, and Re-evaluation  RECOMMENDED OTHER SERVICES: none at this time  CONSULTED AND AGREED WITH PLAN OF CARE: Patient and family member/caregiver  PLAN FOR NEXT SESSION: Review coordination HEP for Lt hand and Initiate sh flexion ex to increase LUE high level flexion; crossword puzzle   Dennis Bast, OT 09/02/2022, 1:45 PM

## 2022-09-05 DIAGNOSIS — I639 Cerebral infarction, unspecified: Secondary | ICD-10-CM | POA: Diagnosis not present

## 2022-09-06 NOTE — Therapy (Signed)
[place] [facility]  Patient Name: Gina Bailey MRN: [id number] DOB:January 06, 1946, 77 y.o., female Today's Date: 09/07/2022  PCP: Angelica Pou, MD  REFERRING PROVIDER: Charlett Blake, MD  END OF SESSION:  OT End of Session - 09/07/22 1107     Visit Number 4    Number of Visits 13    Date for OT Re-Evaluation 10/02/22    Authorization Type [employer] - requires auth    Authorization Time Period 13 VISITS approved 1/29 - 10/02/22    Authorization - Visit Number 4    Authorization - Number of Visits 10    Progress Note Due on Visit 10    OT Start Time 1106    OT Stop Time [id number]   pt went to restroom during session   OT Time Calculation (min) 39 min    Activity Tolerance Patient tolerated treatment well    Behavior During Therapy [facility] for tasks assessed/performed               Past Medical History:  Diagnosis Date   Chronic kidney disease    Diabetes mellitus    Diet-controlled. Not on antiglycemic medications.   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  Based on the size, patient's age, status as a former smoker, and its discovery in a community setting, her probability of malignancy is 3% (low probability).  Based on this, I recommend follow-up with a CT scan around 05/2014, and if lesion is stable or smaller, no further follow-up is needed.  CT Chest 06/2015: Stable 62m pulmonary nodule. No   Ventral hernia    Incisional ventral hernia. S/P repair by Dr. 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 WHulen Skains  (08/2006)   Past Surgical History:  Procedure Laterality Date   APPENDECTOMY     AV FISTULA PLACEMENT Left 03/10/2022   Procedure: LEFT ARM ARTERIOVENOUS (AV) FISTULA CREATION VERSUS GRAFT;  Surgeon: CWaynetta Sandy MD;  [place]  Service: Vascular;  Laterality: Left;   HEMORRHOID SURGERY     hemorrhoidectomy   RIGHT COLECTOMY Right 08/20/2006   Partial right colectomy and anastomosis with resection of villous adenoma.   TONSILLECTOMY     TUBAL LIGATION     VENTRAL HERNIA REPAIR  08/20/2008   By Dr. 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, psychotic disturbance, mood disturbance, or anxiety ([place] 05/22/2022   Gout due to renal impairment 04/22/2022   Hemiparesis of left nondominant side as late effect of cerebral infarction ([facility] 04/16/2022   Anemia of chronic renal failure 04/09/2022   Gait instability with hx of falls 04/09/2022   Heart murmur, systolic [id number]  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   Cataracts, bilateral 08/05/2021   CKD (chronic kidney disease) stage 5, GFR less than 15 ml/min ([facility] 04/08/2021   Pancreas cysts, incidental finding, likely benign, f/u imaging in 2024  can be considered 08/26/2020   Chronic left hip pain 09/08/2011   Preventative health care 09/04/2010   Hx of Type 2 diabetes mellitus without complication, with no history of insulin use ([place]) 04/18/2009   Hyperlipidemia associated with type 2 diabetes mellitus ([place]) 06/24/2007   VILLOUS ADENOMA, COLON, HX OF 09/09/2006   Hypertension 06/04/2006    ONSET DATE: 07/16/2022   REFERRING DIAG: I63.9 (ICD-10-CM) - Subcortical infarction  THERAPY DIAG:  Other symptoms and signs involving the nervous system  Attention and concentration deficit  Muscle weakness (generalized)  Unsteadiness on feet  Other abnormalities of gait and  mobility  Visuospatial deficit  Rationale for Evaluation and Treatment: Rehabilitation  SUBJECTIVE:   SUBJECTIVE STATEMENT: I can't do much with my L hand.  I'm not left handed at all.   Pt accompanied by: family member - son, [name]  PERTINENT HISTORY: Pt presented to the emergency department on 04/15/2022 complaining of left facial droop, slurred speech.  MRI with findings of right posterior limb internal capsule as well as left tiny periventricular white matter lacunar infarcts.  No large vessel stenosis or occlusion on MR angiogram. Etiology likely secondary to chronic small vessel disease. Pt with admission to [facility] inpatient rehab center from 10/4-10/17/23.   Prior to her stroke she did not require any assistive device to ambulate.    PMH significant for T2DM with neuropathy, poorly controlled HTN, ESRD with AVF LUE, HLD, former smoker,  diastolic HF.   PRECAUTIONS: Fall  WEIGHT BEARING RESTRICTIONS: No  PAIN:  Are you having pain? No  FALLS: Has patient fallen in last 6 months? No  LIVING ENVIRONMENT: Lives with: lives with their son (prior to CVA was also living with son) Lives in: House/apartment Stairs: Yes: External: 1 steps; none Has following equipment at home: Gilford Rile - 2 wheeled, Shower bench, Grab bars, and also has a bed rail    Needs help with bathing. Does not do any household cleaning or laundry.   PLOF: Independent; had stopped driving; retired Chief Executive Officer  PATIENT GOALS: return to PLOF  OBJECTIVE:   HAND DOMINANCE: Right  ADLs: Eating: independent Grooming: independent UB Dressing: modified independent (occasionally needs assistance to orient clothing front to back) LB Dressing: modified independent (occasionally needs assistance to orient clothing front to back) Toileting: independent Bathing: mod I Tub Shower transfers: min A Equipment: Transfer tub bench, bed side commode, and suction cup grab bars  IADLs: Shopping:  dependent Light housekeeping: dependent Meal Prep: dependent Community mobility: dependent at baseline Medication management: dependent with pill box Financial management: dependent Handwriting: 100% legible  MOBILITY STATUS: Needs Assist: min A uses RW  ACTIVITY TOLERANCE: Activity tolerance: fair  FUNCTIONAL OUTCOME MEASURES: FOTO: 61    UPPER EXTREMITY ROM:  end range limited LUE sh flexion  AROM Right (eval) Left (eval)  Shoulder flexion WNL WFL  Shoulder abduction WNL WNL  Elbow flexion WNL WNL  Elbow extension WNL WNL  Wrist flexion WNL WNL  Wrist extension WNL WNL  Wrist pronation WNL WNL  Wrist supination WNL WNL   Digit Composite Flexion WNL WNL  Digit Composite Extension WNL WNL  Digit Opposition WNL WNL  (Blank rows = not tested)  UPPER EXTREMITY MMT:     MMT Right (eval) Left (eval)  Shoulder flexion WNL WNL  Shoulder abduction WNL WNL  Elbow flexion WNL WNL  Elbow extension WNL 3+/5  (Blank rows = not tested)  HAND FUNCTION: Grip strength: Right: 40.5 lbs; Left: 44.5 lbs  COORDINATION: Box  and Blocks:  Right 37blocks, Left 33blocks 08/19/22: 9 hole peg test: Rt = 38.33 sec, Lt = 45.48  SENSATION: WFL  EDEMA: none  MUSCLE TONE: LUE: Within functional limits  COGNITION: Overall cognitive status: Within functional limits for tasks assessed and see ST eval  VISION: Subjective report: no change reported by pt, son reports she drifts to left side  Baseline vision: Wears glasses for reading only Visual history: cataracts w/ surgery  VISION ASSESSMENT: Inattention to L side  PERCEPTION: Impaired: Inattention/neglect: does not attend to left visual field  PRAXIS: WFL  OBSERVATIONS: Pt appears well kept. Uses RW with min A for mobility.    TODAY'S TREATMENT:      Placing small pegs in pegboard to copy design for incr coordination and visual scanning with incr time for coordination and min-mod cueing to copy simple design.  Reviewed  shuffling cards from coordination HEP with mod cueing.  Then dealing and flipping cards with L hand with mod cueing.                                                                                   PATIENT EDUCATION: Education details: Futures trader Person educated: Patient and Child(ren) Education method: Explanation, demo, handouts Education comprehension: son verbalized understanding, pt needs cues/reinforcement to implement  HOME EXERCISE PROGRAM: 08/19/22: visual scanning strategies 2/14: L coordination HEP 09/07/22:  cane HEP  GOALS:  SHORT TERM GOALS: Target date: 09/14/2022   Patient will demonstrate LUE HEP with 25% verbal cues or less for proper execution. Baseline: Goal status: INITIAL  2.  Pt will complete light meal prep (make sandwich or bowl of cereal) with good safety and SBA. Baseline: dependent Goal status: INITIAL  3.  Patient will report at least 2 compensatory strategies for visual impairment/attention to L side without cueing. Baseline:  Goal status: in progress   LONG TERM GOALS: Target date: 10/02/2022  Patient will demonstrate updated LUE HEP with 25% verbal cues or less for proper execution. Baseline:  Goal status: INITIAL  2.  Pt will demonstrate ability to cook item at stovetop such as eggs or boxed item with distance supervision only. Baseline: dependent Goal status: INITIAL  3.  Pt will complete d/c FOTO. Baseline:  Goal status: INITIAL  4.  Pt will report no difficulty placing 1 lb item on shelf at shoulder level using LUE. Baseline: mild difficulty Goal status: INITIAL   ASSESSMENT:  CLINICAL IMPRESSION: Pt continues to need min-mod cueing for functional tasks involving LUE due to cognitive deficits.    PERFORMANCE DEFICITS: in functional skills including ADLs, IADLs, coordination, strength, Fine motor control, mobility, endurance, vision, and UE functional use.  IMPAIRMENTS: are limiting patient from ADLs and IADLs.    CO-MORBIDITIES: may have co-morbidities  that affects occupational performance. Patient will benefit from skilled OT to address above impairments and improve overall function.  REHAB POTENTIAL: Good    PLAN:  OT FREQUENCY: 2x/week  OT DURATION: 6 weeks  PLANNED INTERVENTIONS: self care/ADL training, therapeutic exercise, therapeutic activity, neuromuscular re-education, patient/family education, cognitive remediation/compensation, visual/perceptual remediation/compensation, energy conservation, DME and/or AE instructions, and Re-evaluation  RECOMMENDED OTHER SERVICES: none at this time  CONSULTED AND AGREED  WITH PLAN OF CARE: Patient and family member/caregiver  PLAN FOR NEXT SESSION:   Review coordination HEP and cane HEP; crossword puzzle   [place], OTR/L 09/07/2022, 1:18 PM

## 2022-09-07 ENCOUNTER — Ambulatory Visit: Payer: Medicare PPO

## 2022-09-07 ENCOUNTER — Ambulatory Visit: Payer: Medicare PPO | Admitting: Occupational Therapy

## 2022-09-07 ENCOUNTER — Encounter: Payer: Self-pay | Admitting: Occupational Therapy

## 2022-09-07 ENCOUNTER — Ambulatory Visit: Payer: Medicare PPO | Admitting: Physical Therapy

## 2022-09-07 ENCOUNTER — Encounter: Payer: Self-pay | Admitting: Physical Therapy

## 2022-09-07 DIAGNOSIS — R29818 Other symptoms and signs involving the nervous system: Secondary | ICD-10-CM

## 2022-09-07 DIAGNOSIS — M6281 Muscle weakness (generalized): Secondary | ICD-10-CM | POA: Diagnosis not present

## 2022-09-07 DIAGNOSIS — R2689 Other abnormalities of gait and mobility: Secondary | ICD-10-CM | POA: Diagnosis not present

## 2022-09-07 DIAGNOSIS — R471 Dysarthria and anarthria: Secondary | ICD-10-CM | POA: Diagnosis not present

## 2022-09-07 DIAGNOSIS — I639 Cerebral infarction, unspecified: Secondary | ICD-10-CM | POA: Diagnosis not present

## 2022-09-07 DIAGNOSIS — R41842 Visuospatial deficit: Secondary | ICD-10-CM | POA: Diagnosis not present

## 2022-09-07 DIAGNOSIS — R2681 Unsteadiness on feet: Secondary | ICD-10-CM | POA: Diagnosis not present

## 2022-09-07 DIAGNOSIS — R41840 Attention and concentration deficit: Secondary | ICD-10-CM | POA: Diagnosis not present

## 2022-09-07 DIAGNOSIS — R41841 Cognitive communication deficit: Secondary | ICD-10-CM

## 2022-09-07 NOTE — Patient Instructions (Signed)
  Cane Overhead - Sit  Sit With arms straight, hold cane forward at waist. Raise cane above head. Hold 3 seconds. Repeat 10 times. Do 1-2 times per day.    ROM: Abduction - Wand   Sit, Holding wand with left hand palm up, push wand directly out to side, leading with other hand palm down, until stretch is felt. Hold 3 seconds. Repeat 10 times per set. Do 1-2 sessions per day.  Repeat to the other side    ROM: Extension - Wand (Standing)   Stand holding wand behind back. Raise arms as far as possible. PALMS FACING FORWARD Repeat 10 times per set.  Do 1-2 sessions per day.

## 2022-09-07 NOTE — Therapy (Unsigned)
OUTPATIENT SPEECH LANGUAGE PATHOLOGY TREATMENT   Patient Name: Gina Bailey MRN: [id number] DOB:October 05, 1945, 77 y.o., female Today's Date: 09/07/2022  PCP: Angelica Pou MD REFERRING PROVIDER: Charlett Blake, MD  END OF SESSION:  End of Session - 09/07/22 1012     Visit Number 6    Number of Visits 13    Date for SLP Re-Evaluation 09/10/22    Authorization Type [employer] - 12 visits apprd ( 1/11-2/22/24) - LL    SLP Start Time 1015    SLP Stop Time  1100    SLP Time Calculation (min) 45 min    Activity Tolerance Patient tolerated treatment well               Past Medical History:  Diagnosis Date   Chronic kidney disease    Diabetes mellitus    Diet-controlled. Not on antiglycemic medications.   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  Based on the size, patient's age, status as a former smoker, and its discovery in a community setting, her probability of malignancy is 3% (low probability).  Based on this, I recommend follow-up with a CT scan around 05/2014, and if lesion is stable or smaller, no further follow-up is needed.  CT Chest 06/2015: Stable 23m pulmonary nodule. No   Ventral hernia    Incisional ventral hernia. S/P repair by Dr. 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 WHulen Skains (08/2006)   Past Surgical History:  Procedure Laterality Date   APPENDECTOMY     AV FISTULA PLACEMENT Left 03/10/2022   Procedure: LEFT ARM ARTERIOVENOUS (AV) FISTULA CREATION VERSUS GRAFT;  Surgeon: CWaynetta Sandy MD;  [place]  Service: Vascular;  Laterality: Left;   HEMORRHOID SURGERY     hemorrhoidectomy   RIGHT COLECTOMY Right 08/20/2006   Partial right colectomy and anastomosis with resection of villous adenoma.   TONSILLECTOMY     TUBAL LIGATION     VENTRAL HERNIA REPAIR  08/20/2008   By Dr. 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   Cataracts, bilateral 08/05/2021   CKD (chronic kidney disease) stage 5, GFR less than 15 ml/min ([facility] [place] 04/08/2021   Pancreas cysts, incidental finding, likely benign, f/u imaging in 2024 can be considered 08/26/2020   Chronic left hip pain 09/08/2011   Preventative health care 09/04/2010   Hx of Type 2 diabetes mellitus without complication, with no history of insulin use ([place] 04/18/2009   Hyperlipidemia associated with type 2 diabetes mellitus ([place] 06/24/2007   VILLOUS ADENOMA,  COLON, HX OF 09/09/2006   Hypertension 06/04/2006    ONSET DATE: 07/16/2022 (referral date)   REFERRING DIAG:  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 Additional acute infarct in the occipital lobe periventricular  white matter posterior to the left occipital horn) with admission to [facility] inpatient rehab center from 10/4-10/17/23. PMH: T2DM with neuropathy, poorly controlled HTN, ESRD with AVF LUE, HLD, former smoker, MR, diastolic HF.   PAIN: Are you having pain? No  FALLS: Has patient fallen in last 6 months?  No  PATIENT GOALS: "I don't know" - patient; "the thinking part of it" -son    OBJECTIVE:   TODAY'S TREATMENT:     09/07/22: Pt endorsed that she continues to forget to use her walker, although she has been using the external memory aid for recalling location of briefs. SLP facilitated discussion concerning pt's daily schedule. Pt reported that she has been working on a sewing project. Pt demonstrated confabulation during conversation. Reported that "it's going pretty good" with seeing objects on her L side. SLP provided external visual aid (paper calendar) to answer questions about the date and pt's schedule. Given frequent mod to max verbal and visual cues, pt able to respond with 70% accuracy to questions about the date and her appt schedule. SLP re-educated pt on safely using her walker and pt accurately recalled purpose of walker and, provided min verbal prompt, able to demonstrate safely standing up to use her walker. Dicussed importance of placing memory aids in places where pt will remember to read them. Clinician reminded pt of benefits of implementing a memory book.  Pt endorsed that her conversation partners understand her speech.  SLP created external memory aid to support pt in navigating her TV remote.     09/02/22: Reported success using visual aid to recall placement of briefs. Confirmed by son. Pt continues with inconsistent use of walker. Pt able to verbalize 2 reasons why she should use the walker and 1 resulting consequence of not using walker with extended time. SLP recommended placing walker directly in front of patient to aid recall, especially given left neglect. Currently using  television and newspaper for orientation, in which SLP recommended use of television as available newspaper is not always current. Trialed using calendar with usual extended time required to ID month and date. Max A required to ID upcoming appointments. Suggested simplified calendar with visual cues (marking dates) to aid recall and orientation. Outline for memory aid provided as pt exhibited difficulty recalling favorite restaurants.   08/31/22: Per chart review, pt recently diagnosed with dementia (confirmed by son in session), which correlates to SLP observations including overt impaired recall and reduced awareness. Some success reported with visual aids for recalling location of briefs and walker at home. Less frequent reminders needed per son. Generated additional visual memory aids to assist with recall per family identified goals. SLP created another brief sign for second bathroom and written instructions to answer phone. Good recall of how to answer phone exhibited across trials using written aid. Recommended initiation of memory book to aid recall of pertinent information given new diagnosis of dementia and pt required overt extended time to name family members this session. Examples provided  of information that could be included. Pt and son verbalized understanding.   PATIENT EDUCATION: Education details: see above Person educated: Patient and Child(ren) Education method: Customer service manager Education comprehension: verbalized understanding, returned demonstration, and needs further education   GOALS: Goals reviewed with patient? Yes  SHORT TERM GOALS: Target date: 08/27/2022 (only 2 tx sessions completed)  Pt will recall to use walker with memory compensations given occasional min A over 2 sessions  Baseline: Goal status: PARTIALLY MET  2.  Pt will accurately find bathroom/bathroom items with use of memory compensations given occasional min A for 2/2 opportunities Baseline:   Goal status: PARTIALLY MET  3.  Pt will demonstrate increased orientation with use of memory aids given occasional min A over 2 sessions  Baseline:  Goal status: NOT MET  4.  Pt will carryover dysarthria compensations in simple conversation over 10 minutes with occasional min A over 2 sessions  Baseline:  Goal status: NOT MET   LONG TERM GOALS: Target date: 09/10/2022  Family with report reduced need for verbal prompting to utilize walker at home over 1 week  Baseline:  Goal status: IN PROGRESS  2.  Pt will exhibit increased recall and attention to bathroom location/items with use of visual aids with reduced need for verbal prompting over 1 week  Baseline: 09/02/22 Goal status: IN PROGRESS  3.  Pt will carryover dysarthria compensations in simple conversation over 20 minutes with rare min A  Baseline:  Goal status: IN PROGRESS   ASSESSMENT:  CLINICAL IMPRESSION: Patient is a 77 y.o. female who was seen today for subcortical infarction in Fall 2023. Prior cognitive changes reported prior to hospitalization requiring increased assistance with iADLs (medications). Son endorsed pt now requiring some reduced cues and prompts to recall to use walker, locate bathroom items, and follow daily routine. Cognitive impairment contributing to reduced safety awareness and awareness of deficits. Mild dysarthria exhibited at conversation level, in which pt benefited from slow rate and over-articulation to optimize speech intelligibility on occasion. Pt and son denied dysphagia at this time. Given change in baseline, pt would benefit from skilled ST intervention to address cognitive communication skills to maximize safety and functional independence.   OBJECTIVE IMPAIRMENTS: include memory, awareness, executive functioning, and dysarthria. These impairments are limiting patient from household responsibilities and effectively communicating at home and in community. Factors affecting potential to achieve  goals and functional outcome are previous level of function. Patient will benefit from skilled SLP services to address above impairments and improve overall function.  REHAB POTENTIAL: Good  PLAN:  SLP FREQUENCY: 2x/week  SLP DURATION: 6 weeks  PLANNED INTERVENTIONS: Environmental controls, Cueing hierachy, Cognitive reorganization, Internal/external aids, Oral motor exercises, Functional tasks, Multimodal communication approach, SLP instruction and feedback, Compensatory strategies, and Patient/family education    Leroy Libman, Student-SLP 09/07/2022, 10:13 AM

## 2022-09-07 NOTE — Therapy (Unsigned)
OUTPATIENT PHYSICAL THERAPY NEURO TREATMENT    Patient Name: PRIMROSE BORUM MRN: [id number] DOB:1945/07/25, 77 y.o., female Today's Date: 09/08/2022   PCP: Angelica Pou, MD  REFERRING PROVIDER:  Charlett Blake, MD  END OF SESSION:  PT End of Session - 09/07/22 1154     Visit Number 8    Number of Visits 17   2x/wk frequency for 8 wks...16 + eval   Date for PT Re-Evaluation 09/28/22   due to potential delay in scheduling   Authorization Type [employer] 9 visits approved 1/11- 09/09/22    PT Start Time 1150    PT Stop Time 1231    PT Time Calculation (min) 41 min    Equipment Utilized During Treatment Gait belt    Activity Tolerance Patient tolerated treatment well    Behavior During Therapy WFL for tasks assessed/performed             Past Medical History:  Diagnosis Date   Chronic kidney disease    Diabetes mellitus    Diet-controlled. Not on antiglycemic medications.   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  Based on the size, patient's age, status as a former smoker, and its discovery in a community setting, her probability of malignancy is 3% (low probability).  Based on this, I recommend follow-up with a CT scan around 05/2014, and if lesion is stable or smaller, no further follow-up is needed.  CT Chest 06/2015: Stable 18m pulmonary nodule. No   Ventral hernia    Incisional ventral hernia. S/P repair by Dr. 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 WHulen Skains (08/2006)   Past Surgical History:  Procedure Laterality Date    APPENDECTOMY     AV FISTULA PLACEMENT Left 03/10/2022   Procedure: LEFT ARM ARTERIOVENOUS (AV) FISTULA CREATION VERSUS GRAFT;  Surgeon: CWaynetta Sandy MD;  [facility]  Service: Vascular;  Laterality: Left;   HEMORRHOID SURGERY     hemorrhoidectomy   RIGHT COLECTOMY Right 08/20/2006   Partial right colectomy and anastomosis with resection of villous adenoma.   TONSILLECTOMY     TUBAL LIGATION     VENTRAL HERNIA REPAIR  08/20/2008   By Dr. 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, psychotic disturbance, mood disturbance, or anxiety ([facility] 05/22/2022   Gout due to renal impairment 04/22/2022   Hemiparesis of left nondominant side as late effect of cerebral infarction ([facility] 04/16/2022   Anemia of chronic renal failure 04/09/2022   Gait instability with hx of falls 04/09/2022   Heart murmur, systolic [id number]  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   Cataracts, bilateral 08/05/2021   CKD (chronic kidney disease) stage 5, GFR less than 15 ml/min ([facility] 04/08/2021   Pancreas cysts, incidental finding, likely benign, f/u imaging in 2024 can be considered 08/26/2020   Chronic left hip pain 09/08/2011   Preventative health care 09/04/2010  Hx of Type 2 diabetes mellitus without complication, with no history of insulin use ([place]) 04/18/2009   Hyperlipidemia associated with type 2 diabetes mellitus ([place]) 06/24/2007   VILLOUS ADENOMA, COLON, HX OF 09/09/2006   Hypertension 06/04/2006    ONSET DATE:  07/16/2022  REFERRING DIAG: I63.9 (ICD-10-CM) - Subcortical infarction ([place])  THERAPY DIAG:  Muscle weakness (generalized)  Unsteadiness on feet  Other abnormalities of gait and mobility  Rationale for Evaluation and Treatment: Rehabilitation  SUBJECTIVE:                                                                                                                                                                                              SUBJECTIVE STATEMENT: No changes, no falls. She presents w/ son-Jesse this visit.  Son endorses that pt maintains sedentary lifestyle at home.  Pt accompanied by:  son [name]  PERTINENT HISTORY: Pt presented to the emergency department on 04/15/2022 complaining of left facial droop, slurred speech.  MRI with findings of right posterior limb internal capsule as well as left tiny periventricular white matter lacunar infarcts.  No large vessel stenosis or occlusion on MR angiogram. Etiology likely secondary to chronic small vessel disease. Pt with admission to [facility] inpatient rehab center from 10/4-10/17/23.  Prior to her stroke she did not require any assistive device to ambulate.   PMH significant for T2DM with neuropathy, poorly controlled HTN, ESRD with AVF LUE, HLD, former smoker,  diastolic HF.   PAIN:  Are you having pain? No  There were no vitals filed for this visit.  PRECAUTIONS: Fall  WEIGHT BEARING RESTRICTIONS: No  FALLS: Has patient fallen in last 6 months? No  LIVING ENVIRONMENT: Lives with: lives with their son (prior to CVA was also living with son) Lives in: House/apartment Stairs: Yes: External: 1 steps; none Has following equipment at home: Gilford Rile - 2 wheeled, Shower bench, Grab bars, and also has a bed rail   Needs help with bathing. Does not do any household cleaning or laundry.   PLOF: Independent  PATIENT GOALS: Be more independent   OBJECTIVE:   DIAGNOSTIC FINDINGS: On 04/27/22: IMPRESSION: 1. 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 2. Slightly increased size of recent infarct in the posterior limb of the right internal capsule. 3. Extensive chronic small vessel ischemic disease. 4. Motion degraded head MRA without evidence of a large vessel occlusion or significant proximal stenosis.  TODAY'S TREATMENT:             -Discussed discharge next visit w/ goal assessment, pt and son in agreement.  Provided instructions to return w/ new  referral following discharge if patient experiences a status change.  (Will plan to discuss continuing next visit due to scheduling error w/ incorrect frequency and visit number reflected in end of session). -At counter performing quarter turns w/ unilateral UE support, cues for step size and sequencing -In // bars: 4" step taps progressing from BUE > LUE support 2x20 alt LE; on purple balance beam forward and backward tandem 2x10 feet each using BUE support; lateral stepping on foam beam w/ BUE support 2x10 feet each direction -Standing SLS w/ BUE support, cues to correct forward trunk lean x5 > x10 > x20 > 40 seconds each LE -Standing alt LE elevated on 4" step x1 minute each unsupported, in left stance heavy left lean requiring mirror feedback w/ cues, trialed error augmentation w/o notable correction. -STS w/ hands-on-knees x8, cues for upright standing -3.3 lb ball chest press in upright sitting x10  PATIENT EDUCATION: Education details: Continue HEP. Person educated: Patient and pt's son Education method: Explanation, Demonstration, Verbal cues, and Handouts Education comprehension: verbalized understanding and returned demonstration  HOME EXERCISE PROGRAM: Access Code: [email or web address] URL: [email or web address] Date: 08/31/2022 Prepared by: Janann August  Program Notes Walk around the house with walker 2-3 times a day for 2-3 minutes at a time to help build up your strength and endurance! Can do a loop around the house   Exercises - Supine Bridge  - 2 x daily - 4-5 x weekly - 1 sets - 10 reps - Small Range Straight Leg Raise  - 2 x daily - 4-5 x weekly - 1 sets - 10 reps - Standing March with Counter Support  - 2 x daily - 4-5 x weekly - 2 sets - 10 reps - Side Stepping with Counter Support  - 2 x daily - 4-5 x weekly - 3 sets - Modified Thomas Stretch  (Mirrored)  - 1 x daily - 6 x weekly - 3 sets - 30 seconds hold - Sit to Stand Without Arm Support  - 1 x daily - 4-5 x weekly - 2 sets - 10 reps - Forward Step Touch  - 1 x daily - 4-5 x weekly - 3 sets - 10 reps    GOALS: Goals reviewed with patient? Yes  SHORT TERM GOALS: ALL STGS = LTGS DUE TO POC  LONG TERM GOALS: Target date: 08/30/2022  Pt will be independent with final HEP for strength, gait, balance in order to build upon functional gains made in therapy. Baseline:  Goal status: INITIAL  2.  Pt will improve BERG to at least a 49/56 in order to demo decr fall risk  Baseline: 45/56; 46/56 (2/14) Goal status: NOT MET  3.  Pt will improve 5x sit<>stand to less than or equal to 19 sec to demonstrate improved functional strength and transfer efficiency.  Baseline: 26.53 seconds no UE support, 22.5 seconds without UE support on 08/31/22 Goal status: NOT MET  4.  Pt will improve TUG time to 20 seconds or less with LRAD in order to demo decrease fall risk. Baseline: 28.44 seconds with RW; 27.38 seconds with RW on 08/31/22 Goal status: NOT MET  5.  Pt will improve gait speed with LRAD to at least 1.8 ft/sec in order to demo improved community mobility and decr fall risk.  Baseline: 34.46 seconds with RW = .95 ft/sec; 23.31 seconds = 1.41 ft/sec on 08/31/22 Goal status: NOT MET   UPDATED/ONGOING LTGS FOR 8 WEEK POC  LONG TERM GOALS: Target date: 09/28/2022  Pt will  be independent with final HEP for strength, gait, balance in order to build upon functional gains made in therapy. Baseline:  Goal status: ON-GOING  2.  Pt will improve BERG to at least a 49/56 in order to demo decr fall risk  Baseline: 45/56; 46/56 (2/14) Goal status: ON-GOING  3.  Pt will improve 5x sit<>stand to less than or equal to 19 sec to demonstrate improved functional strength and transfer efficiency.  Baseline: 26.53 seconds no UE support, 22.5 seconds without UE support on 08/31/22 Goal status:  ON-GOING  4.  Pt will improve TUG time to 23 seconds or less with LRAD in order to demo decrease fall risk. Baseline: 28.44 seconds with RW; 27.38 seconds with RW on 08/31/22 Goal status: REVISED  5.  Pt will improve gait speed with LRAD to at least 1.6 ft/sec in order to demo improved community mobility and decr fall risk.  Baseline: 34.46 seconds with RW = .95 ft/sec; 23.31 seconds = 1.41 ft/sec on 08/31/22 Goal status: REVISED     ASSESSMENT:  CLINICAL IMPRESSION:  PT to continue discussion about continuing PT next session and obtaining further insurance auth vs discharge as discussed with son and patient today.  She is making modest progress towards LTGs and remains unmotivated at home according to son.  She continues to benefit from skilled PT to decrease fall risk and maintain functional independence.  Continue POC.  OBJECTIVE IMPAIRMENTS: Abnormal gait, decreased activity tolerance, decreased balance, decreased cognition, decreased coordination, decreased mobility, decreased strength, decreased safety awareness, hypomobility, impaired tone, impaired vision/preception, and postural dysfunction.   ACTIVITY LIMITATIONS: carrying, lifting, bending, squatting, stairs, transfers, bed mobility, bathing, dressing, and locomotion level  PARTICIPATION LIMITATIONS: cleaning, laundry, driving, shopping, community activity, and yard work  PERSONAL FACTORS: Age, Behavior pattern, Past/current experiences, Time since onset of injury/illness/exacerbation, and 3+ comorbidities:  T2DM with neuropathy, poorly controlled HTN, ESRD with AVF LUE, HLD, former smoker,  diastolic HF.  are also affecting patient's functional outcome.   REHAB POTENTIAL: Good  CLINICAL DECISION MAKING: Evolving/moderate complexity  EVALUATION COMPLEXITY: Moderate  PLAN:  PT FREQUENCY: 2x/week  PT DURATION: 8 weeks  PLANNED INTERVENTIONS: Therapeutic exercises, Therapeutic activity, Neuromuscular re-education, Balance  training, Gait training, Patient/Family education, Self Care, Stair training, Vestibular training, Visual/preceptual remediation/compensation, Orthotic/Fit training, DME instructions, and Re-evaluation  PLAN FOR NEXT SESSION: Work on standing balance, functional strength. Attention to L side. Continue balance on compliant surface and decr UE support. LLE strengthening. Foot clearance activities. SciFit. Check BP, schedule more visits until 3/11, frequency was incorrect on end of session, discuss continuing vs discharge w/ pt and son.  [employer] auth if continuing to end of current POC-only approved 9 visits through 2/21.   Bary Richard, PT, DPT  09/08/2022, 7:27 AM

## 2022-09-08 ENCOUNTER — Other Ambulatory Visit (HOSPITAL_COMMUNITY): Payer: Self-pay | Admitting: *Deleted

## 2022-09-09 ENCOUNTER — Encounter: Payer: Self-pay | Admitting: Physical Therapy

## 2022-09-09 ENCOUNTER — Ambulatory Visit: Payer: Medicare PPO | Admitting: Speech Pathology

## 2022-09-09 ENCOUNTER — Ambulatory Visit: Payer: Medicare PPO | Admitting: Physical Therapy

## 2022-09-09 ENCOUNTER — Ambulatory Visit: Payer: Medicare PPO | Admitting: Occupational Therapy

## 2022-09-09 ENCOUNTER — Encounter: Payer: Self-pay | Admitting: Occupational Therapy

## 2022-09-09 ENCOUNTER — Encounter: Payer: Self-pay | Admitting: Speech Pathology

## 2022-09-09 ENCOUNTER — Ambulatory Visit (HOSPITAL_COMMUNITY)
Admission: RE | Admit: 2022-09-09 | Discharge: 2022-09-09 | Disposition: A | Discharge status: Home / Self Care | Payer: Medicare PPO | Source: Ambulatory Visit | Attending: Nephrology | Admitting: Nephrology

## 2022-09-09 DIAGNOSIS — M6281 Muscle weakness (generalized): Secondary | ICD-10-CM

## 2022-09-09 DIAGNOSIS — R471 Dysarthria and anarthria: Secondary | ICD-10-CM

## 2022-09-09 DIAGNOSIS — R41840 Attention and concentration deficit: Secondary | ICD-10-CM | POA: Diagnosis not present

## 2022-09-09 DIAGNOSIS — N189 Chronic kidney disease, unspecified: Secondary | ICD-10-CM | POA: Insufficient documentation

## 2022-09-09 DIAGNOSIS — R2681 Unsteadiness on feet: Secondary | ICD-10-CM

## 2022-09-09 DIAGNOSIS — D631 Anemia in chronic kidney disease: Secondary | ICD-10-CM | POA: Diagnosis not present

## 2022-09-09 DIAGNOSIS — R41842 Visuospatial deficit: Secondary | ICD-10-CM

## 2022-09-09 DIAGNOSIS — R29818 Other symptoms and signs involving the nervous system: Secondary | ICD-10-CM | POA: Diagnosis not present

## 2022-09-09 DIAGNOSIS — R2689 Other abnormalities of gait and mobility: Secondary | ICD-10-CM | POA: Diagnosis not present

## 2022-09-09 DIAGNOSIS — I639 Cerebral infarction, unspecified: Secondary | ICD-10-CM | POA: Diagnosis not present

## 2022-09-09 DIAGNOSIS — R41841 Cognitive communication deficit: Secondary | ICD-10-CM

## 2022-09-09 MED ORDER — SODIUM CHLORIDE 0.9 % IV SOLN
510.0000 mg | Freq: Once | INTRAVENOUS | Status: AC
  Administered 2022-09-09: 510 mg via INTRAVENOUS
  Filled 2022-09-09: qty 510

## 2022-09-09 NOTE — Therapy (Signed)
OUTPATIENT SPEECH LANGUAGE PATHOLOGY TREATMENT   Patient Name: Gina Bailey MRN: [id number] DOB:1946-01-19, 77 y.o., female Today's Date: 09/09/2022  PCP: Angelica Pou MD REFERRING PROVIDER: Charlett Blake, MD  END OF SESSION:  End of Session - 09/09/22 1107     Visit Number 7    Number of Visits 13    Date for SLP Re-Evaluation 09/10/22    Authorization Type [employer] - 12 visits apprd ( 1/11-2/22/24) - LL    SLP Start Time 1100    SLP Stop Time  1145    SLP Time Calculation (min) 45 min    Activity Tolerance Patient tolerated treatment well               Past Medical History:  Diagnosis Date   Chronic kidney disease    Diabetes mellitus    Diet-controlled. Not on antiglycemic medications.   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  Based on the size, patient's age, status as a former smoker, and its discovery in a community setting, her probability of malignancy is 3% (low probability).  Based on this, I recommend follow-up with a CT scan around 05/2014, and if lesion is stable or smaller, no further follow-up is needed.  CT Chest 06/2015: Stable 3m pulmonary nodule. No   Ventral hernia    Incisional ventral hernia. S/P repair by Dr. 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 WHulen Skains (08/2006)   Past Surgical History:  Procedure Laterality Date   APPENDECTOMY     AV FISTULA PLACEMENT Left 03/10/2022   Procedure: LEFT ARM ARTERIOVENOUS (AV) FISTULA CREATION VERSUS GRAFT;  Surgeon: CWaynetta Sandy MD;  [facility]  Service: Vascular;  Laterality: Left;   HEMORRHOID SURGERY     hemorrhoidectomy   RIGHT COLECTOMY Right 08/20/2006   Partial right colectomy and anastomosis with resection of villous adenoma.   TONSILLECTOMY     TUBAL LIGATION     VENTRAL HERNIA REPAIR  08/20/2008   By Dr. 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 ([facility] 04/16/2022   Anemia of chronic renal failure 04/09/2022   Gait instability with hx of falls 04/09/2022   Heart murmur, systolic [id number]  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   Cataracts, bilateral 08/05/2021   CKD (chronic kidney disease) stage 5, GFR less than 15 ml/min ([place] 04/08/2021   Pancreas cysts, incidental finding, likely benign, f/u imaging in 2024 can be considered 08/26/2020   Chronic left hip pain 09/08/2011   Preventative health care 09/04/2010   Hx of Type 2 diabetes mellitus without complication, with no history of insulin use ([place] 04/18/2009   Hyperlipidemia associated with type 2 diabetes mellitus ([facility] 06/24/2007   VILLOUS ADENOMA,  COLON, HX OF 09/09/2006   Hypertension 06/04/2006    ONSET DATE: 07/16/2022 (referral date)   REFERRING DIAG:  I63.9 (ICD-10-CM) - Subcortical infarction  THERAPY DIAG:  Cognitive communication deficit  Dysarthria and anarthria  Rationale for Evaluation and Treatment: Rehabilitation  SUBJECTIVE:   SUBJECTIVE STATEMENT: "It's pretty good" Pt accompanied by: family member, son [name]  PERTINENT HISTORY: PMHx of recent right CVA (acute infarct in the right posterior limb of the internal capsule and posteroinferior right lentiform nucleus. Additional acute infarct in the  occipital lobe periventricular white matter posterior to the left occipital horn) with admission to [facility] inpatient rehab center from 10/4-10/17/23. PMH: T2DM with neuropathy, poorly controlled HTN, ESRD with AVF LUE, HLD, former smoker, MR, diastolic HF.   PAIN: Are you having pain? No  FALLS: Has patient fallen in last 6 months?  No  PATIENT GOALS: "We have 2 signs up now, 1 in each bathroom"    OBJECTIVE:   TODAY'S TREATMENT:       09/09/22: Son added a sign that reads "Briefs" to the other bathroom to help Shakeyra locate the briefs in each bathroom, He reports this is 50% successful at helping Josaline locate the briefs. She continues to required verbal cues for safe stand to sit and sit to stand. She continues to require reminders to use her walker at home. Demonstrated activity to use Atisha's long term memory to participate in conversation. She generated 5 types of cakes/pies she used to make and named 5 ingredients for her cakes. With written cues on a white board to keep her on task and recall topic of conversations. Speech is 100% intelligible with mod I. She also generated 8 musicians she enjoys with the same strategy. Provided information on day programs for dementia.   09/07/22: Pt endorsed that she continues to forget to use her walker, although she has been using the external memory aid for recalling location of briefs. SLP facilitated discussion concerning pt's daily schedule. Pt reported that she has been working on a sewing project. Pt demonstrated confabulation during conversation. Reported that "it's going pretty good" with seeing objects on her L side. SLP provided external visual aid (paper calendar) to answer questions about the date and pt's schedule. Given frequent mod to max verbal and visual cues, pt able to respond with 70% accuracy to questions about the date and her appt schedule. SLP re-educated pt on safely using her walker and pt accurately recalled purpose of walker and,  provided min verbal prompt, able to demonstrate safely standing up to use her walker. Dicussed importance of placing memory aids in places where pt will remember to read them. Clinician reminded pt of benefits of implementing a memory book.  Pt endorsed that her conversation partners understand her speech.  SLP created external memory aid to support pt in navigating her TV remote.     09/02/22: Reported success using visual aid to recall placement of briefs. Confirmed by son. Pt continues with inconsistent use of walker. Pt able to verbalize 2 reasons why she should use the walker and 1 resulting consequence of not using walker with extended time. SLP recommended placing walker directly in front of patient to aid recall, especially given left neglect. Currently using television and newspaper for orientation, in which SLP recommended use of television as available newspaper is not always current. Trialed using calendar with usual extended time required to ID month and date. Max A required to ID upcoming appointments. Suggested simplified calendar with visual cues (marking  dates) to aid recall and orientation. Outline for memory aid provided as pt exhibited difficulty recalling favorite restaurants.   08/31/22: Per chart review, pt recently diagnosed with dementia (confirmed by son in session), which correlates to SLP observations including overt impaired recall and reduced awareness. Some success reported with visual aids for recalling location of briefs and walker at home. Less frequent reminders needed per son. Generated additional visual memory aids to assist with recall per family identified goals. SLP created another brief sign for second bathroom and written instructions to answer phone. Good recall of how to answer phone exhibited across trials using written aid. Recommended initiation of memory book to aid recall of pertinent information given new diagnosis of dementia and pt required overt extended time  to name family members this session. Examples provided of information that could be included. Pt and son verbalized understanding.   PATIENT EDUCATION: Education details: see above Person educated: Patient and Child(ren) Education method: Customer service manager Education comprehension: verbalized understanding, returned demonstration, and needs further education   SPEECH THERAPY DISCHARGE SUMMARY  Visits from Start of Care: 7  Current functional level related to goals / functional outcomes: See goals below   Remaining deficits: Memory, attention, processing   Education / Equipment: Compensations for memory and processing, day programs for dementia, environmental modifications to support cognition  Patient agrees to discharge. Patient goals were partially met. Patient is being discharged due to maximized rehab potential. .     GOALS: Goals reviewed with patient? Yes  SHORT TERM GOALS: Target date: 08/27/2022 (only 2 tx sessions completed)  Pt will recall to use walker with memory compensations given occasional min A over 2 sessions  Baseline: Goal status: PARTIALLY MET  2.  Pt will accurately find bathroom/bathroom items with use of memory compensations given occasional min A for 2/2 opportunities Baseline:  Goal status: PARTIALLY MET  3.  Pt will demonstrate increased orientation with use of memory aids given occasional min A over 2 sessions  Baseline:  Goal status: NOT MET  4.  Pt will carryover dysarthria compensations in simple conversation over 10 minutes with occasional min A over 2 sessions  Baseline:  Goal status: NOT MET   LONG TERM GOALS: Target date: 09/10/2022  Family with report reduced need for verbal prompting to utilize walker at home over 1 week  Baseline:  Goal status: NOT MET  2.  Pt will exhibit increased recall and attention to bathroom location/items with use of visual aids with reduced need for verbal prompting over 1 week  Baseline:  09/02/22 Goal status: PARTIALLY MET  3.  Pt will carryover dysarthria compensations in simple conversation over 20 minutes with rare min A  Baseline:  Goal status: MET   ASSESSMENT:  CLINICAL IMPRESSION: Patient is a 77 y.o. female who was seen today for subcortical infarction in Fall 2023. Prior cognitive changes reported prior to hospitalization requiring increased assistance with iADLs (medications). Son endorsed pt now requiring some reduced cues and prompts to recall to use walker, locate bathroom items, and follow daily routine. Cognitive impairment contributing to reduced safety awareness and awareness of deficits. Mild dysarthria exhibited at conversation level, in which pt benefited from slow rate and over-articulation to optimize speech intelligibility on occasion. Pt and son denied dysphagia at this time. Given change in baseline, pt would benefit from skilled ST intervention to address cognitive communication skills to maximize safety and functional independence.   OBJECTIVE IMPAIRMENTS: include memory, awareness, executive functioning, and dysarthria. These impairments are  limiting patient from household responsibilities and effectively communicating at home and in community. Factors affecting potential to achieve goals and functional outcome are previous level of function. Patient will benefit from skilled SLP services to address above impairments and improve overall function.  REHAB POTENTIAL: Good  PLAN:  SLP FREQUENCY: 2x/week  SLP DURATION: 6 weeks  PLANNED INTERVENTIONS: Environmental controls, Cueing hierachy, Cognitive reorganization, Internal/external aids, Oral motor exercises, Functional tasks, Multimodal communication approach, SLP instruction and feedback, Compensatory strategies, and Patient/family education    Iline Oven, CCC-SLP 09/09/2022, 12:02 PM

## 2022-09-09 NOTE — Therapy (Signed)
[facility]  Patient Name: Gina Bailey MRN: [id number] DOB:[birth year], 77 y.o., female Today's Date: 09/09/2022  PCP: Angelica Pou, MD  REFERRING PROVIDER: Charlett Blake, MD  END OF SESSION:  OT End of Session - 09/09/22 1329     Visit Number 5    Number of Visits 13    Date for OT Re-Evaluation 10/02/22    Authorization Type [employer] - requires auth    Authorization Time Period 13 VISITS approved 1/29 - 10/02/22    Authorization - Visit Number 5    Authorization - Number of Visits 13    Progress Note Due on Visit 10    OT Start Time 1233    OT Stop Time 1318    OT Time Calculation (min) 45 min    Activity Tolerance Patient tolerated treatment well    Behavior During Therapy WFL for tasks assessed/performed                Past Medical History:  Diagnosis Date   Chronic kidney disease    Diabetes mellitus    Diet-controlled. Not on antiglycemic medications.   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  Based on the size, patient's age, status as a former smoker, and its discovery in a community setting, her probability of malignancy is 3% (low probability).  Based on this, I recommend follow-up with a CT scan around 05/2014, and if lesion is stable or smaller, no further follow-up is needed.  CT Chest 06/2015: Stable 22m pulmonary nodule. No   Ventral hernia    Incisional ventral hernia. S/P repair by Dr. 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 WHulen Skains (08/2006)   Past Surgical History:   Procedure Laterality Date   APPENDECTOMY     AV FISTULA PLACEMENT Left 03/10/2022   Procedure: LEFT ARM ARTERIOVENOUS (AV) FISTULA CREATION VERSUS GRAFT;  Surgeon: CWaynetta Sandy MD;  [facility]  Service: Vascular;  Laterality: Left;   HEMORRHOID SURGERY     hemorrhoidectomy   RIGHT COLECTOMY Right 08/20/2006   Partial right colectomy and anastomosis with resection of villous adenoma.   TONSILLECTOMY     TUBAL LIGATION     VENTRAL HERNIA REPAIR  08/20/2008   By Dr. 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   Cataracts, bilateral 08/05/2021   CKD (chronic kidney disease) stage 5, GFR less than 15 ml/min ([place] 04/08/2021   Pancreas cysts, incidental finding, likely benign, f/u imaging in 2024 can be considered 08/26/2020  Chronic left hip pain 09/08/2011   Preventative health care 09/04/2010   Hx of Type 2 diabetes mellitus without complication, with no history of insulin use ([place]) 04/18/2009   Hyperlipidemia associated with type 2 diabetes mellitus ([place]) 06/24/2007   VILLOUS ADENOMA, COLON, HX OF 09/09/2006   Hypertension 06/04/2006    ONSET DATE: 07/16/2022   REFERRING DIAG: I63.9 (ICD-10-CM) - Subcortical infarction  THERAPY DIAG:  Other symptoms and signs involving the nervous system  Attention and concentration deficit  Muscle weakness (generalized)  Visuospatial deficit  Cognitive communication deficit  Acute ischemic stroke  ([place])  Rationale for Evaluation and Treatment: Rehabilitation  SUBJECTIVE:   SUBJECTIVE STATEMENT: Pt states there's no telling how her left hand is going to do putting a puzzle together.   Pt accompanied by: family member - son, [name]  PERTINENT HISTORY: Pt presented to the emergency department on 04/15/2022 complaining of left facial droop, slurred speech.  MRI with findings of right posterior limb internal capsule as well as left tiny periventricular white matter lacunar infarcts.  No large vessel stenosis or occlusion on MR angiogram. Etiology likely secondary to chronic small vessel disease. Pt with admission to [facility] inpatient rehab center from 10/4-10/17/23.   Prior to her stroke she did not require any assistive device to ambulate.    PMH significant for T2DM with neuropathy, poorly controlled HTN, ESRD with AVF LUE, HLD, former smoker,  diastolic HF.   PRECAUTIONS: Fall  WEIGHT BEARING RESTRICTIONS: No  PAIN:  Are you having pain? No  FALLS: Has patient fallen in last 6 months? No  LIVING ENVIRONMENT: Lives with: lives with their son (prior to CVA was also living with son) Lives in: House/apartment Stairs: Yes: External: 1 steps; none Has following equipment at home: Gilford Rile - 2 wheeled, Shower bench, Grab bars, and also has a bed rail    Needs help with bathing. Does not do any household cleaning or laundry.   PLOF: Independent; had stopped driving; retired [employer]  PATIENT GOALS: return to PLOF  OBJECTIVE:   HAND DOMINANCE: Right  ADLs: Eating: independent Grooming: independent UB Dressing: modified independent (occasionally needs assistance to orient clothing front to back) LB Dressing: modified independent (occasionally needs assistance to orient clothing front to back) Toileting: independent Bathing: mod I Tub Shower transfers: min A Equipment: Transfer tub bench, bed side commode, and suction cup grab bars  IADLs: Shopping:  dependent Light housekeeping: dependent Meal Prep: dependent Community mobility: dependent at baseline Medication management: dependent with pill box Financial management: dependent Handwriting: 100% legible  MOBILITY STATUS: Needs Assist: min A uses RW  ACTIVITY TOLERANCE: Activity tolerance: fair  FUNCTIONAL OUTCOME MEASURES: FOTO: 61    UPPER EXTREMITY ROM:  end range limited LUE sh flexion  AROM Right (eval) Left (eval)  Shoulder flexion WNL WFL  Shoulder abduction WNL WNL  Elbow flexion WNL WNL  Elbow extension WNL WNL  Wrist flexion WNL WNL  Wrist extension WNL WNL  Wrist pronation WNL WNL  Wrist supination WNL WNL   Digit Composite Flexion WNL WNL  Digit Composite Extension WNL WNL  Digit Opposition WNL WNL  (Blank rows = not tested)  UPPER EXTREMITY MMT:     MMT Right (eval) Left (eval)  Shoulder flexion WNL WNL  Shoulder abduction WNL WNL  Elbow flexion WNL WNL  Elbow extension WNL 3+/5  (Blank rows = not tested)  HAND FUNCTION: Grip strength: Right: 40.5 lbs; Left: 44.5 lbs  COORDINATION: Box and Blocks:  Right 37blocks, Left  33blocks 08/19/22: 9 hole peg test: Rt = 38.33 sec, Lt = 45.48  SENSATION: WFL  EDEMA: none  MUSCLE TONE: LUE: Within functional limits  COGNITION: Overall cognitive status: Within functional limits for tasks assessed and see ST eval  VISION: Subjective report: no change reported by pt, son reports she drifts to left side  Baseline vision: Wears glasses for reading only Visual history: cataracts w/ surgery  VISION ASSESSMENT: Inattention to L side  PERCEPTION: Impaired: Inattention/neglect: does not attend to left visual field  PRAXIS: WFL  OBSERVATIONS: Pt appears well kept. Uses RW with min A for mobility.    TODAY'S TREATMENT:     - Therapeutic activities completed for duration as noted below including: Pt assembled 24 piece jungle puzzle using L hand for piece manipulation for improved fine motor  coordination, visual scanning, and processing. Mod cueing for use of L hand, L sided attention, and sequencing.  Pt completed part of crossword puzzle using scanning strategies to read clues                                                         PATIENT EDUCATION: Education details: activity sequencing; L attention Person educated: Patient and Child(ren) Education method: Explanation, demo, handouts Education comprehension: son verbalized understanding, pt needs cues/reinforcement to implement  HOME EXERCISE PROGRAM: 08/19/22: visual scanning strategies 2/14: L coordination HEP 09/07/22:  cane HEP  GOALS:  SHORT TERM GOALS: Target date: 09/14/2022   Patient will demonstrate LUE HEP with 25% verbal cues or less for proper execution. Baseline: Goal status: INITIAL  2.  Pt will complete light meal prep (make sandwich or bowl of cereal) with good safety and SBA. Baseline: dependent Goal status: INITIAL  3.  Patient will report at least 2 compensatory strategies for visual impairment/attention to L side without cueing. Baseline:  Goal status: in progress   LONG TERM GOALS: Target date: 10/02/2022  Patient will demonstrate updated LUE HEP with 25% verbal cues or less for proper execution. Baseline:  Goal status: INITIAL  2.  Pt will demonstrate ability to cook item at stovetop such as eggs or boxed item with distance supervision only. Baseline: dependent Goal status: INITIAL  3.  Pt will complete d/c FOTO. Baseline:  Goal status: INITIAL  4.  Pt will report no difficulty placing 1 lb item on shelf at shoulder level using LUE. Baseline: mild difficulty Goal status: INITIAL   ASSESSMENT:  CLINICAL IMPRESSION: Pt continues to need mod cueing for functional tasks involving LUE due to cognitive deficits to promote improved safety and independence.    PERFORMANCE DEFICITS: in functional skills including ADLs, IADLs, coordination, strength, Fine motor control, mobility,  endurance, vision, and UE functional use.  IMPAIRMENTS: are limiting patient from ADLs and IADLs.   CO-MORBIDITIES: may have co-morbidities  that affects occupational performance. Patient will benefit from skilled OT to address above impairments and improve overall function.  REHAB POTENTIAL: Good   PLAN:  OT FREQUENCY: 2x/week  OT DURATION: 6 weeks  PLANNED INTERVENTIONS: self care/ADL training, therapeutic exercise, therapeutic activity, neuromuscular re-education, patient/family education, cognitive remediation/compensation, visual/perceptual remediation/compensation, energy conservation, DME and/or AE instructions, and Re-evaluation  RECOMMENDED OTHER SERVICES: none at this time  CONSULTED AND AGREED WITH PLAN OF CARE: Patient and family member/caregiver  PLAN FOR NEXT SESSION:   Review coordination HEP  and cane HEP; crossword puzzle   Dennis Bast, OTR/L 09/09/2022, 1:32 PM

## 2022-09-09 NOTE — Patient Instructions (Addendum)
   Look into [facility] [phone] full day or half day programs   [facility] website  See if [facility] will let her go just to a day program  Follow [facility] on social media (IG) and [employer]

## 2022-09-09 NOTE — Therapy (Signed)
OUTPATIENT PHYSICAL THERAPY NEURO TREATMENT/DISCHARGE SUMMARY   Patient Name: Gina Bailey MRN: [id number] DOB:22-May-1946, 77 y.o., female Today's Date: 09/09/2022   PCP: Angelica Pou, MD  REFERRING PROVIDER:  Charlett Blake, MD  PHYSICAL THERAPY DISCHARGE SUMMARY  Visits from Start of Care: 9  Current functional level related to goals / functional outcomes: See clinical impression statement.   Remaining deficits: Increased forward trunk lean, reliance on RW for safety, cognitive deficits at baseline, generalized weakness   Education / Equipment: Discharge plan and process for obtaining referral to return if status change occurs, progress towards goals, continue HEP and modification to walking program.   Patient agrees to discharge. Patient goals were partially met. Patient is being discharged due to maximized rehab potential.    END OF SESSION:  PT End of Session - 09/09/22 1154     Visit Number 9    Number of Visits 9    Date for PT Re-Evaluation 09/28/22   due to potential delay in scheduling   Authorization Type [employer] 9 visits approved 1/11- 09/09/22    PT Start Time 1154   Pt in restroom upon leaving speech therapy   PT Stop Time 1220    PT Time Calculation (min) 26 min    Equipment Utilized During Treatment Gait belt    Activity Tolerance Patient tolerated treatment well    Behavior During Therapy WFL for tasks assessed/performed             Past Medical History:  Diagnosis Date   Chronic kidney disease    Diabetes mellitus    Diet-controlled. Not on antiglycemic medications.   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  Based on the size, patient's age,  status as a former smoker, and its discovery in a community setting, her probability of malignancy is 3% (low probability).  Based on this, I recommend follow-up with a CT scan around 05/2014, and if lesion is stable or smaller, no further follow-up is needed.  CT Chest 06/2015: Stable 2m pulmonary nodule. No   Ventral hernia    Incisional ventral hernia. S/P repair by Dr. 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 WHulen Skains (08/2006)   Past Surgical History:  Procedure Laterality Date   APPENDECTOMY     AV FISTULA PLACEMENT Left 03/10/2022   Procedure: LEFT ARM ARTERIOVENOUS (AV) FISTULA CREATION VERSUS GRAFT;  Surgeon: CWaynetta Sandy MD;  [place] [facility]  Service: Vascular;  Laterality: Left;   HEMORRHOID SURGERY     hemorrhoidectomy   RIGHT COLECTOMY Right 08/20/2006   Partial right colectomy and anastomosis with resection of villous adenoma.   TONSILLECTOMY     TUBAL LIGATION     VENTRAL HERNIA REPAIR  08/20/2008   By Dr. 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, psychotic disturbance, mood disturbance, or anxiety ([facility] 05/22/2022   Gout due to renal impairment  04/22/2022   Hemiparesis of left nondominant side as late effect of cerebral infarction ([place]) 04/16/2022   Anemia of chronic renal failure 04/09/2022   Gait instability with hx of falls 04/09/2022   Heart murmur, systolic [id number]   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   Cataracts, bilateral 08/05/2021   CKD (chronic kidney disease) stage 5, GFR less than 15 ml/min (HCC) 04/08/2021   Pancreas cysts, incidental finding, likely benign, f/u imaging in 2024 can be considered 08/26/2020   Chronic left hip pain 09/08/2011    Preventative health care 09/04/2010   Hx of Type 2 diabetes mellitus without complication, with no history of insulin use ([place]) 04/18/2009   Hyperlipidemia associated with type 2 diabetes mellitus ([place]) 06/24/2007   VILLOUS ADENOMA, COLON, HX OF 09/09/2006   Hypertension 06/04/2006    ONSET DATE:  07/16/2022  REFERRING DIAG: I63.9 (ICD-10-CM) - Subcortical infarction ([place])  THERAPY DIAG:  Muscle weakness (generalized)  Unsteadiness on feet  Other abnormalities of gait and mobility  Rationale for Evaluation and Treatment: Rehabilitation  SUBJECTIVE:                                                                                                                                                                                             SUBJECTIVE STATEMENT: No changes or falls since last PT visit.  She states she feels good today.  Pt accompanied by:  son [name]  PERTINENT HISTORY: Pt presented to the emergency department on 04/15/2022 complaining of left facial droop, slurred speech.  MRI with findings of right posterior limb internal capsule as well as left tiny periventricular white matter lacunar infarcts.  No large vessel stenosis or occlusion on MR angiogram. Etiology likely secondary to chronic small vessel disease. Pt with admission to [facility] inpatient rehab center from 10/4-10/17/23.  Prior to her stroke she did not require any assistive device to ambulate.   PMH significant for T2DM with neuropathy, poorly controlled HTN, ESRD with AVF LUE, HLD, former smoker,  diastolic HF.   PAIN:  Are you having pain? No  There were no vitals filed for this visit.  PRECAUTIONS: Fall  WEIGHT BEARING RESTRICTIONS: No  FALLS: Has patient fallen in last 6 months? No  LIVING ENVIRONMENT: Lives with: lives with their son (prior to CVA was also living with son) Lives in: House/apartment Stairs: Yes: External: 1 steps; none Has following equipment at home: Gilford Rile - 2 wheeled, Shower  bench, Grab bars, and also has a bed rail   Needs help with bathing. Does not do any household cleaning or laundry.   PLOF: Independent  PATIENT GOALS: Be more independent  OBJECTIVE:   DIAGNOSTIC FINDINGS: On 04/27/22: IMPRESSION: 1. 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 2. Slightly increased size of recent infarct in the posterior limb of the right internal capsule. 3. Extensive chronic small vessel ischemic disease. 4. Motion degraded head MRA without evidence of a large vessel occlusion or significant proximal stenosis.  TODAY'S TREATMENT:            -Discussed discharge plan for today, pt and son remain in agreement. -Discussed walking program w/ instructions to increase to 3-4 minutes 2-3x per day in the home. -Verbally reviewed HEP, pt is doing all but hip flexor stretch (removed from HEP), everything but marches remain medium intensity whereas marches are hard. -BERG:  [facility] PT Assessment - 09/09/22 1202       Berg Balance Test   Sit to Stand Able to stand without using hands and stabilize independently    Standing Unsupported Able to stand safely 2 minutes    Sitting with Back Unsupported but Feet Supported on Floor or Stool Able to sit safely and securely 2 minutes    Stand to Sit Sits safely with minimal use of hands    Transfers Able to transfer safely, minor use of hands    Standing Unsupported with Eyes Closed Able to stand 10 seconds safely    Standing Unsupported with Feet Together Able to place feet together independently and stand 1 minute safely    From Standing, Reach Forward with Outstretched Arm Can reach confidently >25 cm (10")    From Standing Position, Pick up Object from Floor Able to pick up shoe safely and easily    From Standing Position, Turn to Look Behind Over each Shoulder Looks behind from both sides and weight shifts well    Turn 360 Degrees Able to turn 360 degrees safely but slowly    Standing Unsupported, Alternately  Place Feet on Step/Stool Able to complete 4 steps without aid or supervision    Standing Unsupported, One Foot in Front Able to plae foot ahead of the other independently and hold 30 seconds    Standing on One Leg Tries to lift leg/unable to hold 3 seconds but remains standing independently   Can hold LLE up x3 seconds, RLE only 1 second   Total Score 48    Berg comment: 48/56 = Significant Fall Risk            -5xSTS no UE support:  18.72 seconds -TUG w/ RW:  18.53 seconds -10MWT w/ RW:  16.22 seconds = 0.62 m/sec OR 2.03 ft/sec  PATIENT EDUCATION: Education details: Continue HEP. Person educated: Patient and pt's son Education method: Explanation, Demonstration, Verbal cues, and Handouts Education comprehension: verbalized understanding and returned demonstration  HOME EXERCISE PROGRAM: Access Code: [email or web address] URL: [email or web address] Date: 08/31/2022 Prepared by: Janann August  Program Notes Walk around the house with walker 2-3 times a day for 2-3 minutes at a time to help build up your strength and endurance! Can do a loop around the house   Exercises - Supine Bridge  - 2 x daily - 4-5 x weekly - 1 sets - 10 reps - Small Range Straight Leg Raise  - 2 x daily - 4-5 x weekly - 1 sets - 10 reps - Standing March with Counter Support  - 2 x daily - 4-5 x weekly - 2 sets - 10 reps - Side Stepping with Counter Support  - 2 x daily - 4-5 x weekly - 3 sets - Modified  Thomas Stretch (Mirrored)  - 1 x daily - 6 x weekly - 3 sets - 30 seconds hold - Sit to Stand Without Arm Support  - 1 x daily - 4-5 x weekly - 2 sets - 10 reps - Forward Step Touch  - 1 x daily - 4-5 x weekly - 3 sets - 10 reps    GOALS: Goals reviewed with patient? Yes  SHORT TERM GOALS: ALL STGS = LTGS DUE TO POC  LONG TERM GOALS: Target date: 08/30/2022  Pt will be independent with final HEP for strength, gait, balance in order to build upon functional gains made in therapy. Baseline:  Goal  status: MET  2.  Pt will improve BERG to at least a 49/56 in order to demo decr fall risk  Baseline: 45/56; 46/56 (2/14) Goal status: NOT MET  3.  Pt will improve 5x sit<>stand to less than or equal to 19 sec to demonstrate improved functional strength and transfer efficiency.  Baseline: 26.53 seconds no UE support, 22.5 seconds without UE support on 08/31/22 Goal status: NOT MET  4.  Pt will improve TUG time to 20 seconds or less with LRAD in order to demo decrease fall risk. Baseline: 28.44 seconds with RW; 27.38 seconds with RW on 08/31/22 Goal status: NOT MET  5.  Pt will improve gait speed with LRAD to at least 1.8 ft/sec in order to demo improved community mobility and decr fall risk.  Baseline: 34.46 seconds with RW = .95 ft/sec; 23.31 seconds = 1.41 ft/sec on 08/31/22 Goal status: NOT MET   UPDATED/ONGOING LTGS FOR 8 WEEK POC  LONG TERM GOALS: Target date: 09/28/2022  Pt will be independent with final HEP for strength, gait, balance in order to build upon functional gains made in therapy. Baseline: Reviewed and condensed, pt compliant per son (2/21) Goal status: MET  2.  Pt will improve BERG to at least a 49/56 in order to demo decr fall risk  Baseline: 45/56; 46/56 (2/14); 48/56 (2/21) Goal status: PARTIALLY MET  3.  Pt will improve 5x sit<>stand to less than or equal to 19 sec to demonstrate improved functional strength and transfer efficiency.  Baseline: 26.53 seconds no UE support, 22.5 seconds without UE support on 08/31/22; 18.72 seconds no UE support (2/21) Goal status: MET  4.  Pt will improve TUG time to 23 seconds or less with LRAD in order to demo decrease fall risk. Baseline: 28.44 seconds with RW; 27.38 seconds with RW on 08/31/22; 18.53 seconds w/ RW (2/21) Goal status: MET  5.  Pt will improve gait speed with LRAD to at least 1.6 ft/sec in order to demo improved community mobility and decr fall risk.  Baseline: 34.46 seconds with RW = .95 ft/sec; 23.31 seconds  = 1.41 ft/sec on 08/31/22; 2.03 ft/sec w/ RW (2/21) Goal status: MET     ASSESSMENT:  CLINICAL IMPRESSION: LTGs assessed today with pt meeting or partially meeting all goals.  She remains intermittently compliant to HEP and walking program with time progression made to walking program today.  She has made modest improvement on the BERG assessment scoring 48/56 from 45/56 on evaluation.  She completes the 5xSTS without UE support in 18.72 seconds and TUG in 18.53 seconds meeting goal levels.  Her ambulatory speed w/ use of RW is 2.03 ft/sec well above predicted goal level.  At this time, patient and son are in agreement to discharge to home management.   OBJECTIVE IMPAIRMENTS: Abnormal gait, decreased activity tolerance, decreased balance,  decreased cognition, decreased coordination, decreased mobility, decreased strength, decreased safety awareness, hypomobility, impaired tone, impaired vision/preception, and postural dysfunction.   ACTIVITY LIMITATIONS: carrying, lifting, bending, squatting, stairs, transfers, bed mobility, bathing, dressing, and locomotion level  PARTICIPATION LIMITATIONS: cleaning, laundry, driving, shopping, community activity, and yard work  PERSONAL FACTORS: Age, Behavior pattern, Past/current experiences, Time since onset of injury/illness/exacerbation, and 3+ comorbidities:  T2DM with neuropathy, poorly controlled HTN, ESRD with AVF LUE, HLD, former smoker,  diastolic HF.  are also affecting patient's functional outcome.   REHAB POTENTIAL: Good  CLINICAL DECISION MAKING: Evolving/moderate complexity  EVALUATION COMPLEXITY: Moderate  PLAN:  PT FREQUENCY: 2x/week  PT DURATION: 8 weeks  PLANNED INTERVENTIONS: Therapeutic exercises, Therapeutic activity, Neuromuscular re-education, Balance training, Gait training, Patient/Family education, Self Care, Stair training, Vestibular training, Visual/preceptual remediation/compensation, Orthotic/Fit training, DME  instructions, and Re-evaluation  PLAN FOR NEXT SESSION: N/A   Bary Richard, PT, DPT  09/09/2022, 12:56 PM

## 2022-09-14 ENCOUNTER — Ambulatory Visit: Payer: Medicare PPO | Admitting: Occupational Therapy

## 2022-09-16 ENCOUNTER — Ambulatory Visit: Payer: Medicare PPO | Admitting: Occupational Therapy

## 2022-09-16 ENCOUNTER — Encounter (HOSPITAL_COMMUNITY): Payer: Medicare PPO

## 2022-09-16 ENCOUNTER — Encounter: Payer: Self-pay | Admitting: Occupational Therapy

## 2022-09-16 NOTE — Therapy (Deleted)
OUTPATIENT OCCUPATIONAL THERAPY NEURO TREAMENT  Patient Name: Gina Bailey MRN: [id number] DOB:Aug 08, 1945, 77 y.o., female Today's Date: 09/16/2022  PCP: Angelica Pou, MD  REFERRING PROVIDER: Charlett Blake, MD  END OF SESSION:       Past Medical History:  Diagnosis Date   Chronic kidney disease    Diabetes mellitus    Diet-controlled. Not on antiglycemic medications.   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  Based on the size, patient's age, status as a former smoker, and its discovery in a community setting, her probability of malignancy is 3% (low probability).  Based on this, I recommend follow-up with a CT scan around 05/2014, and if lesion is stable or smaller, no further follow-up is needed.  CT Chest 06/2015: Stable 72m pulmonary nodule. No   Ventral hernia    Incisional ventral hernia. S/P repair by Dr. 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 WHulen Skains (08/2006)   Past Surgical History:  Procedure Laterality Date   APPENDECTOMY     AV FISTULA PLACEMENT Left 03/10/2022   Procedure: LEFT ARM ARTERIOVENOUS (AV) FISTULA CREATION VERSUS GRAFT;  Surgeon: CWaynetta Sandy MD;  [place] [facility]  Service: Vascular;  Laterality: Left;   HEMORRHOID SURGERY     hemorrhoidectomy   RIGHT COLECTOMY Right 08/20/2006   Partial right colectomy and anastomosis with resection of villous adenoma.   TONSILLECTOMY     TUBAL LIGATION     VENTRAL HERNIA REPAIR  08/20/2008   By Dr. 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 ([facility] 04/16/2022   Anemia of chronic renal failure 04/09/2022   Gait instability with hx of falls 04/09/2022   Heart murmur, systolic [id number]  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   Cataracts, bilateral 08/05/2021   CKD (chronic kidney disease) stage 5, GFR less than 15 ml/min ([facility] 04/08/2021   Pancreas cysts, incidental finding, likely benign, f/u imaging in 2024 can be considered 08/26/2020   Chronic left hip pain 09/08/2011   Preventative health care 09/04/2010   Hx of Type 2 diabetes mellitus without complication, with no history of insulin use ([facility] 04/18/2009   Hyperlipidemia associated with type 2 diabetes mellitus ([facility] 06/24/2007   VILLOUS ADENOMA, COLON, HX OF 09/09/2006   Hypertension 06/04/2006    ONSET DATE: 07/16/2022   REFERRING DIAG: 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  Rationale for Evaluation and Treatment: Rehabilitation  SUBJECTIVE:   SUBJECTIVE STATEMENT: Pt states there's no telling how her left hand is going to do putting a puzzle together.   Pt accompanied by: family member - son, JDenyse Amass PERTINENT HISTORY: Pt presented to the emergency department on 04/15/2022 complaining of left facial  droop, slurred speech.  MRI with findings of right posterior limb internal capsule as well as left tiny periventricular white matter lacunar infarcts.  No large vessel stenosis or occlusion on MR angiogram. Etiology likely secondary to chronic small vessel disease. Pt with admission to [facility] inpatient rehab center from 10/4-10/17/23.   Prior to her stroke she did not require any assistive device to ambulate.    PMH  significant for T2DM with neuropathy, poorly controlled HTN, ESRD with AVF LUE, HLD, former smoker,  diastolic HF.   PRECAUTIONS: Fall  WEIGHT BEARING RESTRICTIONS: No  PAIN:  Are you having pain? No  FALLS: Has patient fallen in last 6 months? No  LIVING ENVIRONMENT: Lives with: lives with their son (prior to CVA was also living with son) Lives in: House/apartment Stairs: Yes: External: 1 steps; none Has following equipment at home: Gilford Rile - 2 wheeled, Shower bench, Grab bars, and also has a bed rail    Needs help with bathing. Does not do any household cleaning or laundry.   PLOF: Independent; had stopped driving; retired [employer]  PATIENT GOALS: return to PLOF  OBJECTIVE:   HAND DOMINANCE: Right  ADLs: Eating: independent Grooming: independent UB Dressing: modified independent (occasionally needs assistance to orient clothing front to back) LB Dressing: modified independent (occasionally needs assistance to orient clothing front to back) Toileting: independent Bathing: mod I Tub Shower transfers: min A Equipment: Transfer tub bench, bed side commode, and suction cup grab bars  IADLs: Shopping: dependent Light housekeeping: dependent Meal Prep: dependent Community mobility: dependent at baseline Medication management: dependent with pill box Financial management: dependent Handwriting: 100% legible  MOBILITY STATUS: Needs Assist: min A uses RW  ACTIVITY TOLERANCE: Activity tolerance: fair  FUNCTIONAL OUTCOME MEASURES: FOTO: 61    UPPER EXTREMITY ROM:  end range limited LUE sh flexion  AROM Right (eval) Left (eval)  Shoulder flexion WNL WFL  Shoulder abduction WNL WNL  Elbow flexion WNL WNL  Elbow extension WNL WNL  Wrist flexion WNL WNL  Wrist extension WNL WNL  Wrist pronation WNL WNL  Wrist supination WNL WNL   Digit Composite Flexion WNL WNL  Digit Composite Extension WNL WNL  Digit Opposition WNL WNL  (Blank rows = not  tested)  UPPER EXTREMITY MMT:     MMT Right (eval) Left (eval)  Shoulder flexion WNL WNL  Shoulder abduction WNL WNL  Elbow flexion WNL WNL  Elbow extension WNL 3+/5  (Blank rows = not tested)  HAND FUNCTION: Grip strength: Right: 40.5 lbs; Left: 44.5 lbs  COORDINATION: Box and Blocks:  Right 37blocks, Left 33blocks 08/19/22: 9 hole peg test: Rt = 38.33 sec, Lt = 45.48  SENSATION: WFL  EDEMA: none  MUSCLE TONE: LUE: Within functional limits  COGNITION: Overall cognitive status: Within functional limits for tasks assessed and see ST eval  VISION: Subjective report: no change reported by pt, son reports she drifts to left side  Baseline vision: Wears glasses for reading only Visual history: cataracts w/ surgery  VISION ASSESSMENT: Inattention to L side  PERCEPTION: Impaired: Inattention/neglect: does not attend to left visual field  PRAXIS: WFL  OBSERVATIONS: Pt appears well kept. Uses RW with min A for mobility.    TODAY'S TREATMENT:     - Therapeutic activities completed for duration as noted below including: Pt assembled 24 piece jungle puzzle using L hand for piece manipulation for improved fine motor coordination, visual scanning, and processing. Mod cueing for use of L hand, L sided attention, and  sequencing.  Pt completed part of crossword puzzle using scanning strategies to read clues                                                         PATIENT EDUCATION: Education details: activity sequencing; L attention Person educated: Patient and Child(ren) Education method: Explanation, demo, handouts Education comprehension: son verbalized understanding, pt needs cues/reinforcement to implement  HOME EXERCISE PROGRAM: 08/19/22: visual scanning strategies 2/14: L coordination HEP 09/07/22:  cane HEP  GOALS:  SHORT TERM GOALS: Target date: 09/14/2022   Patient will demonstrate LUE HEP with 25% verbal cues or less for proper execution. Baseline: Goal  status: INITIAL  2.  Pt will complete light meal prep (make sandwich or bowl of cereal) with good safety and SBA. Baseline: dependent Goal status: INITIAL  3.  Patient will report at least 2 compensatory strategies for visual impairment/attention to L side without cueing. Baseline:  Goal status: in progress   LONG TERM GOALS: Target date: 10/02/2022  Patient will demonstrate updated LUE HEP with 25% verbal cues or less for proper execution. Baseline:  Goal status: INITIAL  2.  Pt will demonstrate ability to cook item at stovetop such as eggs or boxed item with distance supervision only. Baseline: dependent Goal status: INITIAL  3.  Pt will complete d/c FOTO. Baseline:  Goal status: INITIAL  4.  Pt will report no difficulty placing 1 lb item on shelf at shoulder level using LUE. Baseline: mild difficulty Goal status: INITIAL   ASSESSMENT:  CLINICAL IMPRESSION: Pt continues to need mod cueing for functional tasks involving LUE due to cognitive deficits to promote improved safety and independence.    PERFORMANCE DEFICITS: in functional skills including ADLs, IADLs, coordination, strength, Fine motor control, mobility, endurance, vision, and UE functional use.  IMPAIRMENTS: are limiting patient from ADLs and IADLs.   CO-MORBIDITIES: may have co-morbidities  that affects occupational performance. Patient will benefit from skilled OT to address above impairments and improve overall function.  REHAB POTENTIAL: Good   PLAN:  OT FREQUENCY: 2x/week  OT DURATION: 6 weeks  PLANNED INTERVENTIONS: self care/ADL training, therapeutic exercise, therapeutic activity, neuromuscular re-education, patient/family education, cognitive remediation/compensation, visual/perceptual remediation/compensation, energy conservation, DME and/or AE instructions, and Re-evaluation  RECOMMENDED OTHER SERVICES: none at this time  CONSULTED AND AGREED WITH PLAN OF CARE: Patient and family  member/caregiver  PLAN FOR NEXT SESSION:   Review coordination HEP and cane HEP; crossword puzzle   Dennis Bast, OTR/L 09/16/2022, 11:57 AM

## 2022-09-21 ENCOUNTER — Ambulatory Visit: Payer: Medicare PPO | Admitting: Occupational Therapy

## 2022-09-21 DIAGNOSIS — R809 Proteinuria, unspecified: Secondary | ICD-10-CM | POA: Diagnosis not present

## 2022-09-21 DIAGNOSIS — D631 Anemia in chronic kidney disease: Secondary | ICD-10-CM | POA: Diagnosis not present

## 2022-09-21 DIAGNOSIS — E1122 Type 2 diabetes mellitus with diabetic chronic kidney disease: Secondary | ICD-10-CM | POA: Diagnosis not present

## 2022-09-21 DIAGNOSIS — N2581 Secondary hyperparathyroidism of renal origin: Secondary | ICD-10-CM | POA: Diagnosis not present

## 2022-09-21 DIAGNOSIS — I120 Hypertensive chronic kidney disease with stage 5 chronic kidney disease or end stage renal disease: Secondary | ICD-10-CM | POA: Diagnosis not present

## 2022-09-21 DIAGNOSIS — N185 Chronic kidney disease, stage 5: Secondary | ICD-10-CM | POA: Diagnosis not present

## 2022-09-21 NOTE — Therapy (Unsigned)
  Occupational Therapy Discharge Summary   Patient: Gina Bailey MRN: [id number] Date of Birth: 05/05/1946  Pt's son called to request all return visits be cancelled and pt be d/c from therapy.  Goal status unknown at time of d/c.  Murlean Caller, OTR/L 09/21/2022 [place] Phone: [phone] Fax: [phone]

## 2022-09-22 LAB — LAB REPORT - SCANNED: EGFR: 7

## 2022-09-23 ENCOUNTER — Encounter: Payer: Self-pay | Admitting: Podiatry

## 2022-09-23 ENCOUNTER — Ambulatory Visit: Payer: Medicare PPO | Admitting: Occupational Therapy

## 2022-09-23 ENCOUNTER — Ambulatory Visit (INDEPENDENT_AMBULATORY_CARE_PROVIDER_SITE_OTHER): Payer: Medicare PPO | Admitting: Podiatry

## 2022-09-23 DIAGNOSIS — B351 Tinea unguium: Secondary | ICD-10-CM | POA: Diagnosis not present

## 2022-09-23 DIAGNOSIS — M79675 Pain in left toe(s): Secondary | ICD-10-CM

## 2022-09-23 DIAGNOSIS — M79674 Pain in right toe(s): Secondary | ICD-10-CM

## 2022-09-23 DIAGNOSIS — E119 Type 2 diabetes mellitus without complications: Secondary | ICD-10-CM

## 2022-09-23 NOTE — Progress Notes (Signed)
This patient returns to my office for at risk foot care.  This patient requires this care by a professional since this patient will be at risk due to having type 2 diabetes and CKD.  This patient is unable to cut nails herself since the patient cannot reach her nails.These nails are painful walking and wearing shoes.  This patient presents for at risk foot care today.  General Appearance  Alert, conversant and in no acute stress.  Vascular  Dorsalis pedis are  weakly palpable  bilaterally.  Posterior tibial pulses are absent.Capillary return is within normal limits  bilaterally. Temperature is within normal limits  bilaterally.  Neurologic  Senn-Weinstein monofilament wire test diminished  bilaterally. Muscle power within normal limits bilaterally.  Nails Thick disfigured discolored nails with subungual debris  from hallux to fifth toes bilaterally. No evidence of bacterial infection or drainage bilaterally.  Orthopedic  No limitations of motion  feet .  No crepitus or effusions noted.  No bony pathology or digital deformities noted. Hammer toe second  B/L.  HAV  B/L.  Skin  normotropic skin with no porokeratosis noted bilaterally.  No signs of infections or ulcers noted.     Onychomycosis  Pain in right toes  Pain in left toes  Consent was obtained for treatment procedures.   Mechanical debridement of nails 1-5  bilaterally performed with a nail nipper.  Filed with dremel without incident.    Return office visit     3 months                Told patient to return for periodic foot care and evaluation due to potential at risk complications.   Gardiner Barefoot DPM

## 2022-09-28 ENCOUNTER — Encounter: Payer: Medicare PPO | Admitting: Occupational Therapy

## 2022-09-30 ENCOUNTER — Encounter: Payer: Medicare PPO | Admitting: Occupational Therapy

## 2022-10-04 DIAGNOSIS — I639 Cerebral infarction, unspecified: Secondary | ICD-10-CM | POA: Diagnosis not present

## 2022-10-05 ENCOUNTER — Encounter: Payer: Medicare PPO | Admitting: Occupational Therapy

## 2022-10-07 ENCOUNTER — Encounter: Payer: Medicare PPO | Admitting: Occupational Therapy

## 2022-10-08 ENCOUNTER — Ambulatory Visit (INDEPENDENT_AMBULATORY_CARE_PROVIDER_SITE_OTHER): Payer: Medicare PPO | Admitting: Internal Medicine

## 2022-10-08 VITALS — BP 145/59 | HR 76 | Temp 98.5°F | Ht 65.0 in | Wt 165.2 lb

## 2022-10-08 DIAGNOSIS — I10 Essential (primary) hypertension: Secondary | ICD-10-CM | POA: Diagnosis not present

## 2022-10-08 DIAGNOSIS — N3941 Urge incontinence: Secondary | ICD-10-CM

## 2022-10-08 DIAGNOSIS — I480 Paroxysmal atrial fibrillation: Secondary | ICD-10-CM

## 2022-10-08 DIAGNOSIS — N185 Chronic kidney disease, stage 5: Secondary | ICD-10-CM | POA: Diagnosis not present

## 2022-10-08 DIAGNOSIS — F01B0 Vascular dementia, moderate, without behavioral disturbance, psychotic disturbance, mood disturbance, and anxiety: Secondary | ICD-10-CM | POA: Diagnosis not present

## 2022-10-08 NOTE — Progress Notes (Signed)
Routine f/u for Gina Bailey who is living with stage 5 CKD followed closely at [facility] with no need to begin HD at this point.  Mood ok, appetite ok, no pain no dyspnea.  Her only complaint is difficulty controlling her bladder, about which she states "when I get that rumbling and boiling down low I know I got to get to the bathroom because I'm going to go!" She specifically notes this is bladder and not bowel activity.  Her son with whom she lives reports that they are doing ok with no new concerns.  He continues to hope that HD will not be necessary, as she is enjoying a fairly symptom-free life at this time.  BP (!) 145/59 (BP Location: Left Arm, Patient Position: Sitting, Cuff Size: Small)   Pulse 76   Temp 98.5 F (36.9 C) (Oral)   Ht 5\' 5"  (1.651 m)   Wt 165 lb 3.2 oz (74.9 kg)   LMP 10/22/1978   SpO2 99%   BMI 27.49 kg/m  [name] looks quite nice today as she always does, positive affect, attentive, alert.  Able to walk with assistance to her transport chair.    Assessment and plan: Gina Bailey is doing remarkably well with very slow progression of her CKD and no indication yet for HD, which she/family would like to avoid if possible.  They are aware that HD is difficult to tolerate for many older people, even more so in setting of dementia.  Son expresses desire to discuss pros/cons once HD is necessary; they are open to conservative/palliative care for CKD as well.    Problems (non prioritized):  Paroxysmal atrial fibrillation (HCC) HR regular today; continues on apixaban without bleeding complications.  Monitor.    Hypertension Management per nephrologist whom she saw recently. 145/59 is actually well controlled for her and tight control is not a goal.  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  She is in a safe an loving environment where her needs  are being met.  Monitor.    CKD (chronic kidney disease) stage 5, GFR less than 15 ml/min (HCC) Very recent visit with nephrologist "she had a good report" per son; no indications for HD yet.  Family are still open to conservative/palliative care for ESRD and which to have continued discussions with nephrologist.    Urinary incontinence, urge Insurance doesn't cover pullups, unfortunately.  We discussed that baby diapers (without using fasteners; laid into the underwear) may be more cost effective.  Timed voiding discussed with her son as a strategy for management.  Dementia is interfering - she often changes her pullup/clothing even when dry, and throws all items in the trash.  Challenging to be sure.  Medications have no role here.

## 2022-10-09 ENCOUNTER — Other Ambulatory Visit: Payer: Self-pay | Admitting: Student

## 2022-10-09 ENCOUNTER — Encounter (HOSPITAL_COMMUNITY)
Admission: RE | Admit: 2022-10-09 | Discharge: 2022-10-09 | Disposition: A | Discharge status: Home / Self Care | Payer: Medicare PPO | Source: Ambulatory Visit | Attending: Nephrology | Admitting: Nephrology

## 2022-10-09 VITALS — BP 146/65 | HR 65 | Temp 98.0°F | Resp 18

## 2022-10-09 DIAGNOSIS — N185 Chronic kidney disease, stage 5: Secondary | ICD-10-CM | POA: Diagnosis not present

## 2022-10-09 DIAGNOSIS — I10 Essential (primary) hypertension: Secondary | ICD-10-CM

## 2022-10-09 LAB — POCT HEMOGLOBIN-HEMACUE: Hemoglobin: 9.6 g/dL — ABNORMAL LOW (ref 12.0–15.0)

## 2022-10-09 MED ORDER — EPOETIN ALFA-EPBX 10000 UNIT/ML IJ SOLN: 10000.0000 [IU] | INTRAMUSCULAR | Status: DC

## 2022-10-09 MED ORDER — EPOETIN ALFA-EPBX 10000 UNIT/ML IJ SOLN
INTRAMUSCULAR | Status: AC
  Administered 2022-10-09: 10000 [IU] via SUBCUTANEOUS
  Filled 2022-10-09: qty 1

## 2022-10-12 ENCOUNTER — Encounter: Payer: Medicare PPO | Admitting: Occupational Therapy

## 2022-10-12 ENCOUNTER — Encounter: Payer: Self-pay | Admitting: Internal Medicine

## 2022-10-12 DIAGNOSIS — D6869 Other thrombophilia: Secondary | ICD-10-CM | POA: Insufficient documentation

## 2022-10-12 DIAGNOSIS — I480 Paroxysmal atrial fibrillation: Secondary | ICD-10-CM | POA: Insufficient documentation

## 2022-10-14 ENCOUNTER — Encounter: Payer: Medicare PPO | Admitting: Occupational Therapy

## 2022-10-14 ENCOUNTER — Encounter: Payer: Self-pay | Admitting: Nephrology

## 2022-10-22 NOTE — Assessment & Plan Note (Signed)
No behavioral challenges.  She is in a safe an loving environment where her needs are being met.  Monitor.

## 2022-10-22 NOTE — Assessment & Plan Note (Signed)
HR regular today; continues on apixaban without bleeding complications.  Monitor.

## 2022-10-22 NOTE — Assessment & Plan Note (Signed)
Insurance doesn't cover pullups, unfortunately.  We discussed that baby diapers (without using fasteners; laid into the underwear) may be more cost effective.  Timed voiding discussed with her son as a strategy for management.  Dementia is interfering - she often changes her pullup/clothing even when dry, and throws all items in the trash.  Challenging to be sure.  Medications have no role here.

## 2022-10-22 NOTE — Assessment & Plan Note (Signed)
Very recent visit with nephrologist "she had a good report" per son; no indications for HD yet.  Family are still open to conservative/palliative care for ESRD and which to have continued discussions with nephrologist.

## 2022-10-22 NOTE — Assessment & Plan Note (Signed)
Management per nephrologist whom she saw recently. 145/59 is actually well controlled for her and tight control is not a goal.

## 2022-11-02 DIAGNOSIS — N189 Chronic kidney disease, unspecified: Secondary | ICD-10-CM | POA: Diagnosis not present

## 2022-11-02 DIAGNOSIS — N185 Chronic kidney disease, stage 5: Secondary | ICD-10-CM | POA: Diagnosis not present

## 2022-11-02 DIAGNOSIS — I120 Hypertensive chronic kidney disease with stage 5 chronic kidney disease or end stage renal disease: Secondary | ICD-10-CM | POA: Diagnosis not present

## 2022-11-02 DIAGNOSIS — E1122 Type 2 diabetes mellitus with diabetic chronic kidney disease: Secondary | ICD-10-CM | POA: Diagnosis not present

## 2022-11-02 DIAGNOSIS — D631 Anemia in chronic kidney disease: Secondary | ICD-10-CM | POA: Diagnosis not present

## 2022-11-02 DIAGNOSIS — N2581 Secondary hyperparathyroidism of renal origin: Secondary | ICD-10-CM | POA: Diagnosis not present

## 2022-11-04 DIAGNOSIS — I639 Cerebral infarction, unspecified: Secondary | ICD-10-CM | POA: Diagnosis not present

## 2022-11-05 LAB — LAB REPORT - SCANNED
Albumin, Urine POC: 3.8
Creatinine, POC: 6.11 mg/dL
EGFR: 7

## 2022-11-06 ENCOUNTER — Ambulatory Visit (HOSPITAL_COMMUNITY)
Admission: RE | Admit: 2022-11-06 | Discharge: 2022-11-06 | Disposition: A | Discharge status: Home / Self Care | Payer: Medicare PPO | Source: Ambulatory Visit | Attending: Nephrology | Admitting: Nephrology

## 2022-11-06 VITALS — BP 148/67 | HR 68 | Temp 97.7°F | Resp 17

## 2022-11-06 DIAGNOSIS — N185 Chronic kidney disease, stage 5: Secondary | ICD-10-CM

## 2022-11-06 LAB — FERRITIN: Ferritin: 1204 ng/mL — ABNORMAL HIGH (ref 11–307)

## 2022-11-06 LAB — IRON AND TIBC
Iron: 55 ug/dL (ref 28–170)
Saturation Ratios: 23 % (ref 10.4–31.8)
TIBC: 241 ug/dL — ABNORMAL LOW (ref 250–450)
UIBC: 186 ug/dL

## 2022-11-06 LAB — POCT HEMOGLOBIN-HEMACUE: Hemoglobin: 9.8 g/dL — ABNORMAL LOW (ref 12.0–15.0)

## 2022-11-06 MED ORDER — EPOETIN ALFA-EPBX 10000 UNIT/ML IJ SOLN
INTRAMUSCULAR | Status: AC
  Administered 2022-11-06: 10000 [IU] via SUBCUTANEOUS
  Filled 2022-11-06: qty 1

## 2022-11-06 MED ORDER — EPOETIN ALFA-EPBX 10000 UNIT/ML IJ SOLN: 10000.0000 [IU] | INTRAMUSCULAR | Status: DC

## 2022-11-12 DIAGNOSIS — I679 Cerebrovascular disease, unspecified: Secondary | ICD-10-CM | POA: Diagnosis not present

## 2022-11-12 LAB — HM DIABETES EYE EXAM

## 2022-12-04 ENCOUNTER — Encounter (HOSPITAL_COMMUNITY)
Admission: RE | Admit: 2022-12-04 | Discharge: 2022-12-04 | Disposition: A | Discharge status: Home / Self Care | Payer: Medicare PPO | Source: Ambulatory Visit | Attending: Nephrology | Admitting: Nephrology

## 2022-12-04 VITALS — BP 147/68 | HR 62 | Temp 97.6°F | Resp 17

## 2022-12-04 DIAGNOSIS — N185 Chronic kidney disease, stage 5: Secondary | ICD-10-CM | POA: Diagnosis not present

## 2022-12-04 DIAGNOSIS — I639 Cerebral infarction, unspecified: Secondary | ICD-10-CM | POA: Diagnosis not present

## 2022-12-04 LAB — IRON AND TIBC
Iron: 73 ug/dL (ref 28–170)
Saturation Ratios: 30 % (ref 10.4–31.8)
TIBC: 244 ug/dL — ABNORMAL LOW (ref 250–450)
UIBC: 171 ug/dL

## 2022-12-04 LAB — POCT HEMOGLOBIN-HEMACUE: Hemoglobin: 10 g/dL — ABNORMAL LOW (ref 12.0–15.0)

## 2022-12-04 LAB — FERRITIN: Ferritin: 491 ng/mL — ABNORMAL HIGH (ref 11–307)

## 2022-12-04 MED ORDER — EPOETIN ALFA-EPBX 10000 UNIT/ML IJ SOLN
INTRAMUSCULAR | Status: AC
  Filled 2022-12-04: qty 1

## 2022-12-04 MED ORDER — EPOETIN ALFA-EPBX 10000 UNIT/ML IJ SOLN
10000.0000 [IU] | INTRAMUSCULAR | Status: DC
  Administered 2022-12-04: 10000 [IU] via SUBCUTANEOUS

## 2022-12-07 NOTE — Progress Notes (Unsigned)
Cardiology Office Note:   Date:  12/08/2022  ID:  TNIYA LEPRE, DOB 11-Sep-1945, MRN [id number]  History of Present Illness:   Gina Bailey is a 77 y.o. female with a history of HTN, HLD, DMII, CKD, CVA and pAFib who presents to clinic for follow-up.  Admitted 04/15/2022 after presenting with sudden onset left sided weakness, aphasia and dysarthria. MRI revealed acute lacunar infarct in the R internal capsule and L periventricular white matter. She was started on aspirin and Plavix for 3 weeks followed by aspirin alone. Crestor 20 mg was started.   She was admitted to rehab 04/22/2022. Concern for mental status changes on 10/9. Code stroke called and neurology evaluated. CT head performed without acute abnormalities. MRI/MRA/EEG obtained.  Temporary worsening of deficits resolved and likely secondary to hypoperfusion. MRI showed new punctate stroke in right frontal white matter as well as increased size of infarct in the right internal capsule, concerning for embolic source.  Therefore, ZIO AT monitor for surveillance of atrial fibrillation arranged via electrophysiology department.    Per telephone note on 06/05/2022, [facility] called to report a late finding of Atrial Fibrillation on the ZIO AT monitor, which was reviewed by the DOD that day, who advised for follow-up with cardiology.  Was initially seen in Cardiology clinic on 05/2022 where she was recovering from her stroke symptoms. Was otherwise stable from a CV standpoint.  Today, the patient is feeling okay. Left sided weakness has improved. She is ambulating with a walker without issues. No chest pain, SOB, palpitations, lightheadedness or dizziness. No recent falls. No LE edema, orthopnea or PND. Tolerating medications as prescribed. No bleeding issues on the apixaban. BP running mainly 130-140s at home.  Past Medical History:  Diagnosis Date   Chronic kidney disease    Diabetes mellitus    Diet-controlled. Not on antiglycemic  medications.   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  Based on the size, patient's age, status as a former smoker, and its discovery in a community setting, her probability of malignancy is 3% (low probability).  Based on this, I recommend follow-up with a CT scan around 05/2014, and if lesion is stable or smaller, no further follow-up is needed.  CT Chest 06/2015: Stable 3mm pulmonary nodule. No   Ventral hernia    Incisional ventral hernia. S/P repair by Dr. 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 Lindie Spruce. (08/2006)     ROS: As per HPI  Studies Reviewed:    EKG:  No new ECG tracing today  Cardiac Studies & Procedures       ECHOCARDIOGRAM  ECHOCARDIOGRAM COMPLETE BUBBLE STUDY 04/16/2022  Narrative ECHOCARDIOGRAM REPORT    Patient Name:   Gina Bailey Date of Exam: 04/16/2022 Medical Rec #:  [id number]       Height:       65.0 in Accession #:    [id number]      Weight:       174.6 lb Date of Birth:  12-07-1945  BSA:          1.867 m Patient Age:    76 years        BP:           163/87 mmHg Patient Gender: F               HR:           78 bpm. Exam Location:  Inpatient  Procedure: 2D Echo and Saline Contrast Bubble Study  Indications:    stroke  History:        Patient has prior history of Echocardiogram examinations, most recent 09/20/2020. Signs/Symptoms:Murmur; Risk Factors:Diabetes and Hypertension.  Sonographer:    Cathie Hoops Referring Phys: [phone] DONALD WICKLINE  IMPRESSIONS   1. Left ventricular ejection fraction, by estimation, is  60 to 65%. The left ventricle has normal function. The left ventricle has no regional wall motion abnormalities. There is moderate left ventricular hypertrophy. Left ventricular diastolic parameters are indeterminate. 2. Right ventricular systolic function is normal. The right ventricular size is normal. There is normal pulmonary artery systolic pressure. 3. Large pleural effusion. 4. The mitral valve is normal in structure. No evidence of mitral valve regurgitation. 5. The aortic valve is tricuspid. Aortic valve regurgitation is not visualized. 6. The inferior vena cava is normal in size with greater than 50% respiratory variability, suggesting right atrial pressure of 3 mmHg. 7. Agitated saline contrast bubble study was negative, with no evidence of any interatrial shunt.  Comparison(s): No significant change from prior study.  FINDINGS Left Ventricle: Left ventricular ejection fraction, by estimation, is 60 to 65%. The left ventricle has normal function. The left ventricle has no regional wall motion abnormalities. The left ventricular internal cavity size was normal in size. There is moderate left ventricular hypertrophy. Left ventricular diastolic parameters are indeterminate.  Right Ventricle: The right ventricular size is normal. Right vetricular wall thickness was not well visualized. Right ventricular systolic function is normal. There is normal pulmonary artery systolic pressure. The tricuspid regurgitant velocity is 2.59 m/s, and with an assumed right atrial pressure of 3 mmHg, the estimated right ventricular systolic pressure is 29.8 mmHg.  Left Atrium: Left atrial size was normal in size.  Right Atrium: Right atrial size was normal in size.  Pericardium: There is no evidence of pericardial effusion.  Mitral Valve: The mitral valve is normal in structure. No evidence of mitral valve regurgitation.  Tricuspid Valve: The tricuspid valve is normal in structure. Tricuspid valve  regurgitation is not demonstrated.  Aortic Valve: The aortic valve is tricuspid. Aortic valve regurgitation is not visualized.  Pulmonic Valve: The pulmonic valve was normal in structure. Pulmonic valve regurgitation is not visualized.  Aorta: The aortic root and ascending aorta are structurally normal, with no evidence of dilitation.  Venous: The inferior vena cava is normal in size with greater than 50% respiratory variability, suggesting right atrial pressure of 3 mmHg.  IAS/Shunts: The interatrial septum was not well visualized. Agitated saline contrast was given intravenously to evaluate for intracardiac shunting. Agitated saline contrast bubble study was negative, with no evidence of any interatrial shunt.  Additional Comments: There is a large pleural effusion.   LEFT VENTRICLE PLAX 2D LVIDd:         4.20 cm      Diastology LVIDs:         3.00 cm      LV e' medial:    6.96 cm/s LV PW:  1.30 cm      LV E/e' medial:  14.4 LV IVS:        1.30 cm      LV e' lateral:   8.38 cm/s LVOT diam:     1.90 cm      LV E/e' lateral: 11.9 LV SV:         84 LV SV Index:   45 LVOT Area:     2.84 cm  LV Volumes (MOD) LV vol d, MOD A2C: 74.4 ml LV vol d, MOD A4C: 116.0 ml LV vol s, MOD A2C: 33.5 ml LV vol s, MOD A4C: 47.9 ml LV SV MOD A2C:     40.9 ml LV SV MOD A4C:     116.0 ml LV SV MOD BP:      55.5 ml  RIGHT VENTRICLE RV Basal diam:  3.50 cm RV Mid diam:    2.50 cm RV S prime:     14.10 cm/s TAPSE (M-mode): 2.8 cm  LEFT ATRIUM             Index        RIGHT ATRIUM           Index LA diam:        3.30 cm 1.77 cm/m   RA Area:     11.40 cm LA Vol (A2C):   38.8 ml 20.78 ml/m  RA Volume:   21.60 ml  11.57 ml/m LA Vol (A4C):   59.9 ml 32.08 ml/m LA Biplane Vol: 50.4 ml 26.99 ml/m AORTIC VALVE             PULMONIC VALVE LVOT Vmax:   131.00 cm/s PV Vmax:       1.10 m/s LVOT Vmean:  90.600 cm/s PV Peak grad:  4.8 mmHg LVOT VTI:    0.295 m  AORTA Ao Root diam: 3.20  cm Ao Asc diam:  3.30 cm  MITRAL VALVE                TRICUSPID VALVE MV Area (PHT): 4.39 cm     TR Peak grad:   26.8 mmHg MV Decel Time: 173 msec     TR Vmax:        259.00 cm/s MR Peak grad: 55.8 mmHg MR Vmax:      373.33 cm/s   SHUNTS MV E velocity: 100.00 cm/s  Systemic VTI:  0.30 m MV A velocity: 95.60 cm/s   Systemic Diam: 1.90 cm MV E/A ratio:  1.05  Photographer signed by Carolan Clines Signature Date/Time: 04/16/2022/10:45:17 AM    Final    MONITORS  LONG TERM MONITOR-LIVE TELEMETRY (3-14 DAYS) 06/05/2022  Narrative Patch Wear Time:  16 days and 4 hours (2023-10-10T13:03:14-0400 to 2023-11-05T18:30:23-0500)  Monitor 1 Predominant rhythm was sinus rhythm Less than 1% ventricular and supraventricular ectopy burden No triggered episodes recorded  Monitor 2 Predominant rhythm was sinus rhythm 86 SVT runs occurred, all less than 45 seconds Less than 1% atrial fibrillation burden Less than 1% ventricular and supraventricular ectopy No triggered episodes recorded            Risk Assessment/Calculations:    CHA2DS2-VASc Score = 5  This indicates a 7.2% annual risk of stroke. The patient's score is based upon: CHF History: 0 HTN History: 1 Diabetes History: 0 Stroke History: 0 Vascular Disease History: 1 Age Score: 2 Gender Score: 1         Physical Exam:   VS:  BP (!) 148/72  Pulse 67   Ht 5\' 6"  (1.676 m)   Wt 156 lb 9.6 oz (71 kg)   LMP 10/22/1978   SpO2 97%   BMI 25.28 kg/m    Wt Readings from Last 3 Encounters:  12/08/22 156 lb 9.6 oz (71 kg)  10/08/22 165 lb 3.2 oz (74.9 kg)  09/09/22 160 lb 12.8 oz (72.9 kg)     GEN: Well nourished, well developed in no acute distress NECK: No JVD; No carotid bruits CARDIAC: RRR, 2/6 systolic murmur RESPIRATORY:  Clear to auscultation without rales, wheezing or rhonchi  ABDOMEN: Soft, non-tender, non-distended EXTREMITIES:  No edema; No deformity   ASSESSMENT AND PLAN:    #Paroxysmal  Afib  H/o cryptogenic stroke  -Discovered on zio monitor that was ordered following cryptogenic stroke -TTE with LVEF 60-65%, normal RV, no significant valve disease -Currently doing well and maintaining NSR -Continue apixaban 5mg  BID -Continue coreg 6.25mg  BID   #HTN  - Running 140s at home - Increase amlodipine to 10mg  daily - Continue Carvedilol 6.25 BID    #HLD  -Continue Crestor 20 mg  -Check lipids once fasting   #CKD stage 5.  AV fistula in place, no dialysis as of yet. Last GFR 9. Follows wtih [facility] -Continue lasix 80mg  3x/week -Follow-up with Nephrology  #Carotid artery disease: -1-39% on right; 40-59% on the left in 03/2022 -Repeat carotids in 03/2023 -Continue crestor as above        Signed, Meriam Sprague, MD

## 2022-12-08 ENCOUNTER — Ambulatory Visit: Payer: Medicare PPO | Attending: Cardiology | Admitting: Cardiology

## 2022-12-08 ENCOUNTER — Encounter: Payer: Self-pay | Admitting: Cardiology

## 2022-12-08 VITALS — BP 148/72 | HR 67 | Ht 66.0 in | Wt 156.6 lb

## 2022-12-08 DIAGNOSIS — E785 Hyperlipidemia, unspecified: Secondary | ICD-10-CM

## 2022-12-08 DIAGNOSIS — N185 Chronic kidney disease, stage 5: Secondary | ICD-10-CM

## 2022-12-08 DIAGNOSIS — Z8673 Personal history of transient ischemic attack (TIA), and cerebral infarction without residual deficits: Secondary | ICD-10-CM | POA: Diagnosis not present

## 2022-12-08 DIAGNOSIS — I6529 Occlusion and stenosis of unspecified carotid artery: Secondary | ICD-10-CM

## 2022-12-08 DIAGNOSIS — I10 Essential (primary) hypertension: Secondary | ICD-10-CM

## 2022-12-08 DIAGNOSIS — D6869 Other thrombophilia: Secondary | ICD-10-CM

## 2022-12-08 DIAGNOSIS — E119 Type 2 diabetes mellitus without complications: Secondary | ICD-10-CM | POA: Diagnosis not present

## 2022-12-08 DIAGNOSIS — Z79899 Other long term (current) drug therapy: Secondary | ICD-10-CM

## 2022-12-08 DIAGNOSIS — E1122 Type 2 diabetes mellitus with diabetic chronic kidney disease: Secondary | ICD-10-CM | POA: Diagnosis not present

## 2022-12-08 DIAGNOSIS — N184 Chronic kidney disease, stage 4 (severe): Secondary | ICD-10-CM

## 2022-12-08 DIAGNOSIS — E1169 Type 2 diabetes mellitus with other specified complication: Secondary | ICD-10-CM

## 2022-12-08 DIAGNOSIS — I480 Paroxysmal atrial fibrillation: Secondary | ICD-10-CM | POA: Diagnosis not present

## 2022-12-08 MED ORDER — AMLODIPINE BESYLATE 10 MG PO TABS
10.0000 mg | ORAL_TABLET | Freq: Every day | ORAL | 2 refills | Status: DC
Start: 2022-12-08 — End: 2023-09-14

## 2022-12-08 NOTE — Patient Instructions (Signed)
Medication Instructions:   INCREASE YOUR AMLODIPINE TO 10 MG BY MOUTH DAILY  *If you need a refill on your cardiac medications before your next appointment, please call your pharmacy*   Lab Work:  SOMETIME SOON IN THE OFFICE--LIPIDS--PLEASE COME FASTING TO THIS LAB APPOINTMENT  If you have labs (blood work) drawn today and your tests are completely normal, you will receive your results only by: MyChart Message (if you have MyChart) OR A paper copy in the mail If you have any lab test that is abnormal or we need to change your treatment, we will call you to review the results.   Testing/Procedures:  Your physician has requested that you have a carotid duplex. This test is an ultrasound of the carotid arteries in your neck. It looks at blood flow through these arteries that supply the brain with blood. Allow one hour for this exam. There are no restrictions or special instructions.  SCHEDULE TO BE DONE IN SEPTEMBER 2024 PER DR. Shari Prows     Follow-Up: At [facility], you and your health needs are our priority.  As part of our continuing mission to provide you with exceptional heart care, we have created designated Provider Care Teams.  These Care Teams include your primary Cardiologist (physician) and Advanced Practice Providers (APPs -  Physician Assistants and Nurse Practitioners) who all work together to provide you with the care you need, when you need it.  We recommend signing up for the patient portal called "MyChart".  Sign up information is provided on this After Visit Summary.  MyChart is used to connect with patients for Virtual Visits (Telemedicine).  Patients are able to view lab/test results, encounter notes, upcoming appointments, etc.  Non-urgent messages can be sent to your provider as well.   To learn more about what you can do with MyChart, go to [email or web address].    Your next appointment:   6 month(s)  Provider:   Jari Favre, PA-C, Ronie Spies,  PA-C, Robin Searing, NP, Jacolyn Reedy, PA-C, Eligha Bridegroom, NP, or Tereso Newcomer, PA-C

## 2022-12-10 ENCOUNTER — Ambulatory Visit (INDEPENDENT_AMBULATORY_CARE_PROVIDER_SITE_OTHER): Payer: Medicare PPO | Admitting: Internal Medicine

## 2022-12-10 VITALS — BP 145/58 | HR 67 | Temp 98.1°F | Ht 66.0 in | Wt 159.8 lb

## 2022-12-10 DIAGNOSIS — E1122 Type 2 diabetes mellitus with diabetic chronic kidney disease: Secondary | ICD-10-CM

## 2022-12-10 DIAGNOSIS — N185 Chronic kidney disease, stage 5: Secondary | ICD-10-CM

## 2022-12-10 NOTE — Progress Notes (Signed)
Doing well, nothing has changed. Completely asymptomatic.   Son Gina Bailey - "everything is about the same" - they are pleased with how she has been doing.  Need to replace handicap sticker.    BP (!) 145/58 (BP Location: Right Arm, Patient Position: Sitting, Cuff Size: Small)   Pulse 67   Temp 98.1 F (36.7 C) (Oral)   Ht 5\' 6"  (1.676 m)   Wt 159 lb 12.8 oz (72.5 kg)   LMP 10/22/1978   SpO2 100%   BMI 25.79 kg/m  Bright affect, funny, laughs, responds to questions.  Clear sensorium.  Heart RRR mid syt and soft blowing diastolic murmurs; edema both feet (chronic), lungs clear, neck veins flat  Heart mid syst mumur and soft blowing diastolic; RRR  Close f/u with neph, next week also  Heart care 2 days ago; carotid US ordered!   Need to reconcile amlodipine and lasix doses with nephro Lasix 80 3x week or 40 mg daily? I am not actively adjusting - nephro is managing  Assessment and plan:  CKD (chronic kidney disease) stage 5, GFR less than 15 ml/min (HCC) Remains asymptomatic and enjoying QOL in safe and loving environment.  I am here for emotional support and any non-renal problem.  Close renal f/u is arranged.

## 2022-12-15 ENCOUNTER — Ambulatory Visit: Payer: Medicare PPO | Attending: Cardiology

## 2022-12-15 DIAGNOSIS — E1122 Type 2 diabetes mellitus with diabetic chronic kidney disease: Secondary | ICD-10-CM | POA: Diagnosis not present

## 2022-12-15 DIAGNOSIS — E785 Hyperlipidemia, unspecified: Secondary | ICD-10-CM | POA: Diagnosis not present

## 2022-12-15 DIAGNOSIS — E1169 Type 2 diabetes mellitus with other specified complication: Secondary | ICD-10-CM

## 2022-12-15 DIAGNOSIS — Z79899 Other long term (current) drug therapy: Secondary | ICD-10-CM | POA: Diagnosis not present

## 2022-12-15 DIAGNOSIS — N189 Chronic kidney disease, unspecified: Secondary | ICD-10-CM | POA: Diagnosis not present

## 2022-12-15 DIAGNOSIS — N185 Chronic kidney disease, stage 5: Secondary | ICD-10-CM | POA: Diagnosis not present

## 2022-12-15 DIAGNOSIS — N2581 Secondary hyperparathyroidism of renal origin: Secondary | ICD-10-CM | POA: Diagnosis not present

## 2022-12-15 DIAGNOSIS — I120 Hypertensive chronic kidney disease with stage 5 chronic kidney disease or end stage renal disease: Secondary | ICD-10-CM | POA: Diagnosis not present

## 2022-12-15 DIAGNOSIS — D631 Anemia in chronic kidney disease: Secondary | ICD-10-CM | POA: Diagnosis not present

## 2022-12-16 LAB — LIPID PANEL
Chol/HDL Ratio: 1.9 ratio (ref 0.0–4.4)
Cholesterol, Total: 136 mg/dL (ref 100–199)
HDL: 71 mg/dL (ref >39)
LDL Chol Calc (NIH): 54 mg/dL (ref 0–99)
Triglycerides: 49 mg/dL (ref 0–149)
VLDL Cholesterol Cal: 11 mg/dL (ref 5–40)

## 2022-12-16 LAB — LAB REPORT - SCANNED: EGFR: 6

## 2022-12-17 ENCOUNTER — Encounter (HOSPITAL_COMMUNITY): Payer: Self-pay

## 2022-12-22 NOTE — Assessment & Plan Note (Signed)
Remains asymptomatic and enjoying QOL in safe and loving environment.  I am here for emotional support and any non-renal problem.  Close renal f/u is arranged.

## 2022-12-24 ENCOUNTER — Ambulatory Visit: Payer: Medicare PPO | Admitting: Podiatry

## 2022-12-28 ENCOUNTER — Ambulatory Visit: Payer: Medicare PPO | Admitting: Podiatry

## 2023-01-01 ENCOUNTER — Encounter (HOSPITAL_COMMUNITY)
Admission: RE | Admit: 2023-01-01 | Discharge: 2023-01-01 | Disposition: A | Discharge status: Home / Self Care | Payer: Medicare PPO | Source: Ambulatory Visit | Attending: Nephrology | Admitting: Nephrology

## 2023-01-01 VITALS — BP 143/70 | HR 66 | Temp 97.3°F | Resp 17

## 2023-01-01 DIAGNOSIS — N185 Chronic kidney disease, stage 5: Secondary | ICD-10-CM | POA: Insufficient documentation

## 2023-01-01 LAB — IRON AND TIBC
Iron: 84 ug/dL (ref 28–170)
Saturation Ratios: 35 % — ABNORMAL HIGH (ref 10.4–31.8)
TIBC: 244 ug/dL — ABNORMAL LOW (ref 250–450)
UIBC: 160 ug/dL

## 2023-01-01 LAB — FERRITIN: Ferritin: 406 ng/mL — ABNORMAL HIGH (ref 11–307)

## 2023-01-01 LAB — POCT HEMOGLOBIN-HEMACUE: Hemoglobin: 9.8 g/dL — ABNORMAL LOW (ref 12.0–15.0)

## 2023-01-01 MED ORDER — EPOETIN ALFA-EPBX 10000 UNIT/ML IJ SOLN: 10000.0000 [IU] | INTRAMUSCULAR | Status: DC

## 2023-01-01 MED ORDER — EPOETIN ALFA-EPBX 10000 UNIT/ML IJ SOLN
INTRAMUSCULAR | Status: AC
  Administered 2023-01-01: 10000 [IU] via SUBCUTANEOUS
  Filled 2023-01-01: qty 1

## 2023-01-04 DIAGNOSIS — I639 Cerebral infarction, unspecified: Secondary | ICD-10-CM | POA: Diagnosis not present

## 2023-01-06 DIAGNOSIS — J309 Allergic rhinitis, unspecified: Secondary | ICD-10-CM | POA: Diagnosis not present

## 2023-01-06 DIAGNOSIS — N3941 Urge incontinence: Secondary | ICD-10-CM | POA: Diagnosis not present

## 2023-01-06 DIAGNOSIS — E785 Hyperlipidemia, unspecified: Secondary | ICD-10-CM | POA: Diagnosis not present

## 2023-01-06 DIAGNOSIS — I129 Hypertensive chronic kidney disease with stage 1 through stage 4 chronic kidney disease, or unspecified chronic kidney disease: Secondary | ICD-10-CM | POA: Diagnosis not present

## 2023-01-06 DIAGNOSIS — I69354 Hemiplegia and hemiparesis following cerebral infarction affecting left non-dominant side: Secondary | ICD-10-CM | POA: Diagnosis not present

## 2023-01-06 DIAGNOSIS — R2681 Unsteadiness on feet: Secondary | ICD-10-CM | POA: Diagnosis not present

## 2023-01-06 DIAGNOSIS — I872 Venous insufficiency (chronic) (peripheral): Secondary | ICD-10-CM | POA: Diagnosis not present

## 2023-01-06 DIAGNOSIS — K5900 Constipation, unspecified: Secondary | ICD-10-CM | POA: Diagnosis not present

## 2023-01-06 DIAGNOSIS — E876 Hypokalemia: Secondary | ICD-10-CM | POA: Diagnosis not present

## 2023-01-20 ENCOUNTER — Other Ambulatory Visit: Payer: Self-pay

## 2023-01-20 DIAGNOSIS — I10 Essential (primary) hypertension: Secondary | ICD-10-CM

## 2023-01-20 MED ORDER — APIXABAN 5 MG PO TABS
ORAL_TABLET | ORAL | 3 refills | Status: DC
Start: 2023-01-20 — End: 2023-11-19

## 2023-01-20 NOTE — Telephone Encounter (Signed)
Dosing for age and CKD stage 5 reviewed.  Apixaban 5 bid remains appropriate.

## 2023-01-29 ENCOUNTER — Encounter (HOSPITAL_COMMUNITY)
Admission: RE | Admit: 2023-01-29 | Discharge: 2023-01-29 | Disposition: A | Discharge status: Home / Self Care | Payer: Medicare PPO | Source: Ambulatory Visit | Attending: Nephrology | Admitting: Nephrology

## 2023-01-29 VITALS — BP 169/80 | HR 67 | Temp 97.0°F | Resp 18

## 2023-01-29 DIAGNOSIS — N185 Chronic kidney disease, stage 5: Secondary | ICD-10-CM | POA: Diagnosis not present

## 2023-01-29 LAB — IRON AND TIBC
Iron: 68 ug/dL (ref 28–170)
Saturation Ratios: 27 % (ref 10.4–31.8)
TIBC: 251 ug/dL (ref 250–450)
UIBC: 183 ug/dL

## 2023-01-29 LAB — POCT HEMOGLOBIN-HEMACUE: Hemoglobin: 9.7 g/dL — ABNORMAL LOW (ref 12.0–15.0)

## 2023-01-29 LAB — FERRITIN: Ferritin: 468 ng/mL — ABNORMAL HIGH (ref 11–307)

## 2023-01-29 MED ORDER — EPOETIN ALFA-EPBX 10000 UNIT/ML IJ SOLN
INTRAMUSCULAR | Status: AC
  Filled 2023-01-29: qty 1

## 2023-01-29 MED ORDER — EPOETIN ALFA-EPBX 10000 UNIT/ML IJ SOLN
10000.0000 [IU] | INTRAMUSCULAR | Status: DC
  Administered 2023-01-29: 10000 [IU] via SUBCUTANEOUS

## 2023-02-03 DIAGNOSIS — I639 Cerebral infarction, unspecified: Secondary | ICD-10-CM | POA: Diagnosis not present

## 2023-02-12 ENCOUNTER — Encounter (HOSPITAL_COMMUNITY)
Admission: RE | Admit: 2023-02-12 | Discharge: 2023-02-12 | Disposition: A | Discharge status: Home / Self Care | Payer: Medicare PPO | Source: Ambulatory Visit | Attending: Nephrology | Admitting: Nephrology

## 2023-02-12 VITALS — BP 164/73 | HR 67 | Temp 97.3°F | Resp 17

## 2023-02-12 DIAGNOSIS — N185 Chronic kidney disease, stage 5: Secondary | ICD-10-CM | POA: Diagnosis not present

## 2023-02-12 LAB — POCT HEMOGLOBIN-HEMACUE: Hemoglobin: 9.8 g/dL — ABNORMAL LOW (ref 12.0–15.0)

## 2023-02-12 MED ORDER — EPOETIN ALFA-EPBX 10000 UNIT/ML IJ SOLN
INTRAMUSCULAR | Status: AC
  Administered 2023-02-12: 20000 [IU] via SUBCUTANEOUS
  Filled 2023-02-12: qty 2

## 2023-02-12 MED ORDER — EPOETIN ALFA-EPBX 10000 UNIT/ML IJ SOLN: 20000.0000 [IU] | INTRAMUSCULAR | Status: DC

## 2023-02-15 ENCOUNTER — Other Ambulatory Visit: Payer: Self-pay

## 2023-02-15 DIAGNOSIS — N185 Chronic kidney disease, stage 5: Secondary | ICD-10-CM | POA: Diagnosis not present

## 2023-02-15 DIAGNOSIS — R809 Proteinuria, unspecified: Secondary | ICD-10-CM | POA: Diagnosis not present

## 2023-02-15 DIAGNOSIS — I10 Essential (primary) hypertension: Secondary | ICD-10-CM

## 2023-02-15 DIAGNOSIS — N2581 Secondary hyperparathyroidism of renal origin: Secondary | ICD-10-CM | POA: Diagnosis not present

## 2023-02-15 DIAGNOSIS — E1122 Type 2 diabetes mellitus with diabetic chronic kidney disease: Secondary | ICD-10-CM | POA: Diagnosis not present

## 2023-02-15 DIAGNOSIS — E876 Hypokalemia: Secondary | ICD-10-CM | POA: Diagnosis not present

## 2023-02-15 DIAGNOSIS — Q6102 Congenital multiple renal cysts: Secondary | ICD-10-CM | POA: Diagnosis not present

## 2023-02-15 DIAGNOSIS — I120 Hypertensive chronic kidney disease with stage 5 chronic kidney disease or end stage renal disease: Secondary | ICD-10-CM | POA: Diagnosis not present

## 2023-02-15 NOTE — Telephone Encounter (Signed)
Patients son [name] called he is requesting a rx refill for carvedilol. Patient son stated the patient has been out of medication since last Thursday.

## 2023-02-16 MED ORDER — CARVEDILOL 6.25 MG PO TABS
6.2500 mg | ORAL_TABLET | Freq: Two times a day (BID) | ORAL | 3 refills | Status: AC
Start: 2023-02-16 — End: ?

## 2023-02-26 ENCOUNTER — Ambulatory Visit (HOSPITAL_COMMUNITY)
Admission: RE | Admit: 2023-02-26 | Discharge: 2023-02-26 | Disposition: A | Discharge status: Home / Self Care | Payer: Medicare PPO | Source: Ambulatory Visit | Attending: Nephrology | Admitting: Nephrology

## 2023-02-26 VITALS — BP 145/62 | HR 63 | Temp 97.3°F | Resp 17

## 2023-02-26 DIAGNOSIS — N185 Chronic kidney disease, stage 5: Secondary | ICD-10-CM | POA: Diagnosis not present

## 2023-02-26 LAB — POCT HEMOGLOBIN-HEMACUE: Hemoglobin: 10.3 g/dL — ABNORMAL LOW (ref 12.0–15.0)

## 2023-02-26 LAB — IRON AND TIBC
Iron: 73 ug/dL (ref 28–170)
Saturation Ratios: 31 % (ref 10.4–31.8)
TIBC: 234 ug/dL — ABNORMAL LOW (ref 250–450)
UIBC: 161 ug/dL

## 2023-02-26 LAB — FERRITIN: Ferritin: 486 ng/mL — ABNORMAL HIGH (ref 11–307)

## 2023-02-26 MED ORDER — EPOETIN ALFA-EPBX 10000 UNIT/ML IJ SOLN
20000.0000 [IU] | INTRAMUSCULAR | Status: DC
  Administered 2023-02-26: 20000 [IU] via SUBCUTANEOUS

## 2023-02-26 MED ORDER — EPOETIN ALFA-EPBX 10000 UNIT/ML IJ SOLN
INTRAMUSCULAR | Status: AC
  Filled 2023-02-26: qty 2

## 2023-03-06 DIAGNOSIS — I639 Cerebral infarction, unspecified: Secondary | ICD-10-CM | POA: Diagnosis not present

## 2023-03-12 ENCOUNTER — Encounter (HOSPITAL_COMMUNITY)
Admission: RE | Admit: 2023-03-12 | Discharge: 2023-03-12 | Disposition: A | Discharge status: Home / Self Care | Payer: Medicare PPO | Source: Ambulatory Visit | Attending: Nephrology | Admitting: Nephrology

## 2023-03-12 VITALS — BP 153/71 | HR 66 | Temp 97.0°F | Resp 17

## 2023-03-12 DIAGNOSIS — N185 Chronic kidney disease, stage 5: Secondary | ICD-10-CM | POA: Diagnosis not present

## 2023-03-12 LAB — POCT HEMOGLOBIN-HEMACUE: Hemoglobin: 11.2 g/dL — ABNORMAL LOW (ref 12.0–15.0)

## 2023-03-12 MED ORDER — EPOETIN ALFA-EPBX 10000 UNIT/ML IJ SOLN: 20000.0000 [IU] | INTRAMUSCULAR | Status: DC

## 2023-03-12 MED ORDER — EPOETIN ALFA-EPBX 10000 UNIT/ML IJ SOLN
INTRAMUSCULAR | Status: AC
  Administered 2023-03-12: 20000 [IU] via SUBCUTANEOUS
  Filled 2023-03-12: qty 2

## 2023-03-26 ENCOUNTER — Encounter (HOSPITAL_COMMUNITY)
Admission: RE | Admit: 2023-03-26 | Discharge: 2023-03-26 | Disposition: A | Discharge status: Home / Self Care | Payer: Medicare PPO | Source: Ambulatory Visit | Attending: Nephrology | Admitting: Nephrology

## 2023-03-26 VITALS — BP 132/58 | HR 60 | Temp 97.3°F | Resp 17

## 2023-03-26 DIAGNOSIS — N185 Chronic kidney disease, stage 5: Secondary | ICD-10-CM | POA: Insufficient documentation

## 2023-03-26 LAB — IRON AND TIBC
Iron: 62 ug/dL (ref 28–170)
Saturation Ratios: 28 % (ref 10.4–31.8)
TIBC: 225 ug/dL — ABNORMAL LOW (ref 250–450)
UIBC: 163 ug/dL

## 2023-03-26 LAB — POCT HEMOGLOBIN-HEMACUE: Hemoglobin: 11.1 g/dL — ABNORMAL LOW (ref 12.0–15.0)

## 2023-03-26 LAB — FERRITIN: Ferritin: 232 ng/mL (ref 11–307)

## 2023-03-26 MED ORDER — EPOETIN ALFA-EPBX 10000 UNIT/ML IJ SOLN
INTRAMUSCULAR | Status: AC
  Filled 2023-03-26: qty 1

## 2023-03-26 MED ORDER — EPOETIN ALFA-EPBX 10000 UNIT/ML IJ SOLN
10000.0000 [IU] | Freq: Once | INTRAMUSCULAR | Status: AC
  Administered 2023-03-26: 10000 [IU] via SUBCUTANEOUS

## 2023-03-26 MED ORDER — EPOETIN ALFA-EPBX 10000 UNIT/ML IJ SOLN
20000.0000 [IU] | INTRAMUSCULAR | Status: DC
Start: 2023-03-26 — End: 2023-03-26

## 2023-03-29 ENCOUNTER — Ambulatory Visit (HOSPITAL_COMMUNITY)
Admission: RE | Admit: 2023-03-29 | Discharge: 2023-03-29 | Disposition: A | Discharge status: Home / Self Care | Payer: PRIVATE HEALTH INSURANCE | Source: Ambulatory Visit | Attending: Cardiology | Admitting: Cardiology

## 2023-03-29 DIAGNOSIS — I6529 Occlusion and stenosis of unspecified carotid artery: Secondary | ICD-10-CM | POA: Insufficient documentation

## 2023-03-29 DIAGNOSIS — I6523 Occlusion and stenosis of bilateral carotid arteries: Secondary | ICD-10-CM | POA: Insufficient documentation

## 2023-03-31 ENCOUNTER — Encounter (HOSPITAL_COMMUNITY): Payer: Self-pay

## 2023-04-06 DIAGNOSIS — I639 Cerebral infarction, unspecified: Secondary | ICD-10-CM | POA: Diagnosis not present

## 2023-04-09 ENCOUNTER — Ambulatory Visit (HOSPITAL_COMMUNITY)
Admission: RE | Admit: 2023-04-09 | Discharge: 2023-04-09 | Disposition: A | Discharge status: Home / Self Care | Payer: Medicare PPO | Source: Ambulatory Visit | Attending: Nephrology

## 2023-04-09 ENCOUNTER — Encounter: Payer: Self-pay | Admitting: Cardiology

## 2023-04-09 ENCOUNTER — Telehealth: Payer: Self-pay | Admitting: Cardiology

## 2023-04-09 VITALS — BP 142/77 | HR 67 | Temp 97.5°F | Resp 18

## 2023-04-09 DIAGNOSIS — N185 Chronic kidney disease, stage 5: Secondary | ICD-10-CM | POA: Insufficient documentation

## 2023-04-09 LAB — POCT HEMOGLOBIN-HEMACUE: Hemoglobin: 10.8 g/dL — ABNORMAL LOW (ref 12.0–15.0)

## 2023-04-09 MED ORDER — EPOETIN ALFA-EPBX 10000 UNIT/ML IJ SOLN
INTRAMUSCULAR | Status: AC
  Administered 2023-04-09: 10000 [IU] via SUBCUTANEOUS
  Filled 2023-04-09: qty 1

## 2023-04-09 MED ORDER — EPOETIN ALFA-EPBX 10000 UNIT/ML IJ SOLN: 10000.0000 [IU] | INTRAMUSCULAR | Status: DC

## 2023-04-09 NOTE — Telephone Encounter (Signed)
Called and LVM with patient 3x to schedule her with a new provider since Dr. Shari Prows is no longer with [place]. Unable to reach patient or have patient call us back. Will send letter.

## 2023-04-09 NOTE — Telephone Encounter (Signed)
-----   Message from Nurse Corky Crafts sent at 03/29/2023  5:40 PM EDT ----- Regarding: FW: NEEDS NEW CARDIOLOGIST ASSIGNED  ----- Message ----- From: Loa Socks, LPN Sent: 03/24/6212  12:24 PM EDT To: Conni Elliot; # Subject: NEEDS NEW CARDIOLOGIST ASSIGNED                This pt is due for follow-up with our office in Nov and needs to be assigned to a new Cardiologist, in place of Dr. Shari Prows.  We have her carotid US results back in triage and we need to send this to her new Cardiologist and RN, to result this.  Can you please call her and get her an appt with a new [facility] and let us know in triage the date so that we can send her carotid US results to that MD to review?  Thanks [employer]

## 2023-04-19 DIAGNOSIS — N185 Chronic kidney disease, stage 5: Secondary | ICD-10-CM | POA: Diagnosis not present

## 2023-04-19 DIAGNOSIS — N2581 Secondary hyperparathyroidism of renal origin: Secondary | ICD-10-CM | POA: Diagnosis not present

## 2023-04-19 DIAGNOSIS — D631 Anemia in chronic kidney disease: Secondary | ICD-10-CM | POA: Diagnosis not present

## 2023-04-19 DIAGNOSIS — E1122 Type 2 diabetes mellitus with diabetic chronic kidney disease: Secondary | ICD-10-CM | POA: Diagnosis not present

## 2023-04-19 DIAGNOSIS — I120 Hypertensive chronic kidney disease with stage 5 chronic kidney disease or end stage renal disease: Secondary | ICD-10-CM | POA: Diagnosis not present

## 2023-04-23 ENCOUNTER — Encounter (HOSPITAL_COMMUNITY)
Admission: RE | Admit: 2023-04-23 | Discharge: 2023-04-23 | Disposition: A | Discharge status: Home / Self Care | Payer: Medicare PPO | Source: Ambulatory Visit | Attending: Nephrology | Admitting: Nephrology

## 2023-04-23 VITALS — BP 142/64 | HR 69 | Temp 97.8°F | Resp 17

## 2023-04-23 DIAGNOSIS — N185 Chronic kidney disease, stage 5: Secondary | ICD-10-CM | POA: Insufficient documentation

## 2023-04-23 LAB — RENAL FUNCTION PANEL
Albumin: 3.7 g/dL (ref 3.5–5.0)
Anion gap: 14 (ref 5–15)
BUN: 57 mg/dL — ABNORMAL HIGH (ref 8–23)
CO2: 22 mmol/L (ref 22–32)
Calcium: 7.9 mg/dL — ABNORMAL LOW (ref 8.9–10.3)
Chloride: 108 mmol/L (ref 98–111)
Creatinine, Ser: 7.25 mg/dL — ABNORMAL HIGH (ref 0.44–1.00)
GFR, Estimated: 5 mL/min — ABNORMAL LOW (ref >60)
Glucose, Bld: 95 mg/dL (ref 70–99)
Phosphorus: 7.8 mg/dL — ABNORMAL HIGH (ref 2.5–4.6)
Potassium: 3.6 mmol/L (ref 3.5–5.1)
Sodium: 144 mmol/L (ref 135–145)

## 2023-04-23 LAB — IRON AND TIBC
Iron: 80 ug/dL (ref 28–170)
Saturation Ratios: 31 % (ref 10.4–31.8)
TIBC: 255 ug/dL (ref 250–450)
UIBC: 175 ug/dL

## 2023-04-23 LAB — POCT HEMOGLOBIN-HEMACUE: Hemoglobin: 11.9 g/dL — ABNORMAL LOW (ref 12.0–15.0)

## 2023-04-23 LAB — FERRITIN: Ferritin: 298 ng/mL (ref 11–307)

## 2023-04-23 MED ORDER — EPOETIN ALFA-EPBX 10000 UNIT/ML IJ SOLN: 10000.0000 [IU] | INTRAMUSCULAR | Status: DC

## 2023-04-23 MED ORDER — EPOETIN ALFA-EPBX 10000 UNIT/ML IJ SOLN
INTRAMUSCULAR | Status: AC
  Administered 2023-04-23: 10000 [IU] via SUBCUTANEOUS
  Filled 2023-04-23: qty 1

## 2023-04-25 LAB — PTH, INTACT AND CALCIUM
Calcium, Total (PTH): 8 mg/dL — ABNORMAL LOW (ref 8.7–10.3)
PTH: 201 pg/mL — ABNORMAL HIGH (ref 15–65)

## 2023-05-06 DIAGNOSIS — I639 Cerebral infarction, unspecified: Secondary | ICD-10-CM | POA: Diagnosis not present

## 2023-05-07 ENCOUNTER — Encounter (HOSPITAL_COMMUNITY)
Admission: RE | Admit: 2023-05-07 | Discharge: 2023-05-07 | Disposition: A | Discharge status: Home / Self Care | Payer: Medicare PPO | Source: Ambulatory Visit | Attending: Nephrology | Admitting: Nephrology

## 2023-05-07 VITALS — BP 131/60 | HR 64 | Temp 97.5°F | Resp 17

## 2023-05-07 DIAGNOSIS — N185 Chronic kidney disease, stage 5: Secondary | ICD-10-CM | POA: Diagnosis not present

## 2023-05-07 LAB — POCT HEMOGLOBIN-HEMACUE: Hemoglobin: 11.8 g/dL — ABNORMAL LOW (ref 12.0–15.0)

## 2023-05-07 MED ORDER — EPOETIN ALFA-EPBX 10000 UNIT/ML IJ SOLN
INTRAMUSCULAR | Status: AC
  Filled 2023-05-07: qty 1

## 2023-05-07 MED ORDER — EPOETIN ALFA-EPBX 10000 UNIT/ML IJ SOLN
10000.0000 [IU] | INTRAMUSCULAR | Status: DC
  Administered 2023-05-07: 10000 [IU] via SUBCUTANEOUS

## 2023-05-21 ENCOUNTER — Encounter (HOSPITAL_COMMUNITY)
Admission: RE | Admit: 2023-05-21 | Discharge: 2023-05-21 | Disposition: A | Discharge status: Home / Self Care | Payer: Medicare PPO | Source: Ambulatory Visit | Attending: Nephrology | Admitting: Nephrology

## 2023-05-21 VITALS — BP 158/42 | HR 67 | Temp 97.5°F | Resp 16

## 2023-05-21 DIAGNOSIS — N185 Chronic kidney disease, stage 5: Secondary | ICD-10-CM | POA: Diagnosis not present

## 2023-05-21 LAB — POCT HEMOGLOBIN-HEMACUE: Hemoglobin: 11.5 g/dL — ABNORMAL LOW (ref 12.0–15.0)

## 2023-05-21 LAB — IRON AND TIBC
Iron: 90 ug/dL (ref 28–170)
Saturation Ratios: 34 % — ABNORMAL HIGH (ref 10.4–31.8)
TIBC: 266 ug/dL (ref 250–450)
UIBC: 176 ug/dL

## 2023-05-21 LAB — FERRITIN: Ferritin: 341 ng/mL — ABNORMAL HIGH (ref 11–307)

## 2023-05-21 MED ORDER — EPOETIN ALFA-EPBX 10000 UNIT/ML IJ SOLN
INTRAMUSCULAR | Status: AC
  Filled 2023-05-21: qty 1

## 2023-05-21 MED ORDER — EPOETIN ALFA-EPBX 10000 UNIT/ML IJ SOLN
10000.0000 [IU] | INTRAMUSCULAR | Status: DC
  Administered 2023-05-21: 10000 [IU] via SUBCUTANEOUS

## 2023-05-31 ENCOUNTER — Other Ambulatory Visit: Payer: Self-pay

## 2023-05-31 DIAGNOSIS — E1169 Type 2 diabetes mellitus with other specified complication: Secondary | ICD-10-CM

## 2023-05-31 MED ORDER — ROSUVASTATIN CALCIUM 20 MG PO TABS: 20.0000 mg | ORAL_TABLET | Freq: Every day | ORAL | 3 refills | Status: DC

## 2023-05-31 MED ORDER — ROSUVASTATIN CALCIUM 20 MG PO TABS: 20.0000 mg | ORAL_TABLET | Freq: Every day | ORAL | 1 refills | Status: AC

## 2023-06-04 ENCOUNTER — Encounter (HOSPITAL_COMMUNITY): Payer: Medicare PPO

## 2023-06-18 ENCOUNTER — Encounter (HOSPITAL_COMMUNITY)
Admission: RE | Admit: 2023-06-18 | Discharge: 2023-06-18 | Disposition: A | Discharge status: Home / Self Care | Payer: Medicare PPO | Source: Ambulatory Visit | Attending: Nephrology | Admitting: Nephrology

## 2023-06-18 VITALS — BP 133/56 | HR 65 | Temp 98.5°F | Resp 18

## 2023-06-18 DIAGNOSIS — N185 Chronic kidney disease, stage 5: Secondary | ICD-10-CM

## 2023-06-18 MED ORDER — EPOETIN ALFA-EPBX 10000 UNIT/ML IJ SOLN
10000.0000 [IU] | INTRAMUSCULAR | Status: DC
  Administered 2023-06-18: 10000 [IU] via SUBCUTANEOUS

## 2023-06-18 MED ORDER — EPOETIN ALFA-EPBX 10000 UNIT/ML IJ SOLN
INTRAMUSCULAR | Status: AC
  Filled 2023-06-18: qty 1

## 2023-06-21 LAB — POCT HEMOGLOBIN-HEMACUE: Hemoglobin: 9.8 g/dL — ABNORMAL LOW (ref 12.0–15.0)

## 2023-07-16 ENCOUNTER — Encounter (HOSPITAL_COMMUNITY)
Admission: RE | Admit: 2023-07-16 | Discharge: 2023-07-16 | Disposition: A | Discharge status: Home / Self Care | Payer: Medicare PPO | Source: Ambulatory Visit | Attending: Nephrology | Admitting: Nephrology

## 2023-07-16 VITALS — BP 139/71 | HR 66 | Temp 97.8°F | Resp 18

## 2023-07-16 DIAGNOSIS — N185 Chronic kidney disease, stage 5: Secondary | ICD-10-CM | POA: Diagnosis not present

## 2023-07-16 LAB — IRON AND TIBC
Iron: 91 ug/dL (ref 28–170)
Saturation Ratios: 31 % (ref 10.4–31.8)
TIBC: 298 ug/dL (ref 250–450)
UIBC: 207 ug/dL

## 2023-07-16 LAB — POCT HEMOGLOBIN-HEMACUE: Hemoglobin: 9.4 g/dL — ABNORMAL LOW (ref 12.0–15.0)

## 2023-07-16 LAB — FERRITIN: Ferritin: 473 ng/mL — ABNORMAL HIGH (ref 11–307)

## 2023-07-16 MED ORDER — EPOETIN ALFA-EPBX 10000 UNIT/ML IJ SOLN
10000.0000 [IU] | INTRAMUSCULAR | Status: DC
  Administered 2023-07-16: 10000 [IU] via SUBCUTANEOUS

## 2023-07-16 MED ORDER — EPOETIN ALFA-EPBX 10000 UNIT/ML IJ SOLN
INTRAMUSCULAR | Status: AC
  Filled 2023-07-16: qty 1

## 2023-07-26 ENCOUNTER — Other Ambulatory Visit: Payer: Self-pay

## 2023-07-26 DIAGNOSIS — N2581 Secondary hyperparathyroidism of renal origin: Secondary | ICD-10-CM

## 2023-07-26 MED ORDER — CALCITRIOL 0.25 MCG PO CAPS: 0.2500 ug | ORAL_CAPSULE | ORAL | 3 refills | Status: DC

## 2023-07-29 ENCOUNTER — Telehealth: Payer: Self-pay | Admitting: Student

## 2023-07-29 NOTE — Telephone Encounter (Signed)
 Gina Bailey had a nosebleed that has resolved. Her daughter in law asks about how to prevent another one. I recommended not using any nasal sprays tonight, but over the course of the next few days it would be okay to try saline nasal spray to combat dryness. I recommended oxymetazoline if she has some concomitant congestion but only a short course of a day or so.  Ozell Kung MD 07/29/2023, 7:03 PM

## 2023-07-30 ENCOUNTER — Encounter (HOSPITAL_COMMUNITY)
Admission: RE | Admit: 2023-07-30 | Discharge: 2023-07-30 | Disposition: A | Discharge status: Home / Self Care | Payer: Medicare PPO | Source: Ambulatory Visit | Attending: Nephrology | Admitting: Nephrology

## 2023-07-30 VITALS — BP 143/70 | HR 73 | Temp 97.3°F | Resp 17

## 2023-07-30 DIAGNOSIS — N185 Chronic kidney disease, stage 5: Secondary | ICD-10-CM | POA: Insufficient documentation

## 2023-07-30 LAB — POCT HEMOGLOBIN-HEMACUE: Hemoglobin: 9 g/dL — ABNORMAL LOW (ref 12.0–15.0)

## 2023-07-30 MED ORDER — EPOETIN ALFA-EPBX 10000 UNIT/ML IJ SOLN
INTRAMUSCULAR | Status: AC
  Filled 2023-07-30: qty 2

## 2023-07-30 MED ORDER — EPOETIN ALFA-EPBX 10000 UNIT/ML IJ SOLN
20000.0000 [IU] | INTRAMUSCULAR | Status: DC
  Administered 2023-07-30: 20000 [IU] via SUBCUTANEOUS

## 2023-08-10 DIAGNOSIS — N2581 Secondary hyperparathyroidism of renal origin: Secondary | ICD-10-CM | POA: Diagnosis not present

## 2023-08-10 DIAGNOSIS — N185 Chronic kidney disease, stage 5: Secondary | ICD-10-CM | POA: Diagnosis not present

## 2023-08-10 DIAGNOSIS — I120 Hypertensive chronic kidney disease with stage 5 chronic kidney disease or end stage renal disease: Secondary | ICD-10-CM | POA: Diagnosis not present

## 2023-08-10 DIAGNOSIS — D631 Anemia in chronic kidney disease: Secondary | ICD-10-CM | POA: Diagnosis not present

## 2023-08-10 DIAGNOSIS — E1122 Type 2 diabetes mellitus with diabetic chronic kidney disease: Secondary | ICD-10-CM | POA: Diagnosis not present

## 2023-08-13 ENCOUNTER — Encounter (HOSPITAL_COMMUNITY)
Admission: RE | Admit: 2023-08-13 | Discharge: 2023-08-13 | Disposition: A | Discharge status: Home / Self Care | Payer: Medicare PPO | Source: Ambulatory Visit | Attending: Nephrology

## 2023-08-13 VITALS — BP 134/67 | HR 65 | Temp 97.9°F | Resp 17

## 2023-08-13 DIAGNOSIS — N185 Chronic kidney disease, stage 5: Secondary | ICD-10-CM

## 2023-08-13 LAB — FERRITIN: Ferritin: 547 ng/mL — ABNORMAL HIGH (ref 11–307)

## 2023-08-13 LAB — POCT HEMOGLOBIN-HEMACUE: Hemoglobin: 10.1 g/dL — ABNORMAL LOW (ref 12.0–15.0)

## 2023-08-13 LAB — IRON AND TIBC
Iron: 94 ug/dL (ref 28–170)
Saturation Ratios: 34 % — ABNORMAL HIGH (ref 10.4–31.8)
TIBC: 273 ug/dL (ref 250–450)
UIBC: 179 ug/dL

## 2023-08-13 MED ORDER — EPOETIN ALFA-EPBX 10000 UNIT/ML IJ SOLN
20000.0000 [IU] | INTRAMUSCULAR | Status: DC
  Administered 2023-08-13: 20000 [IU] via SUBCUTANEOUS

## 2023-08-13 MED ORDER — EPOETIN ALFA-EPBX 10000 UNIT/ML IJ SOLN
INTRAMUSCULAR | Status: AC
  Filled 2023-08-13: qty 2

## 2023-08-27 ENCOUNTER — Encounter (HOSPITAL_COMMUNITY)
Admission: RE | Admit: 2023-08-27 | Discharge: 2023-08-27 | Disposition: A | Discharge status: Home / Self Care | Payer: Medicare PPO | Source: Ambulatory Visit | Attending: Nephrology | Admitting: Nephrology

## 2023-08-27 VITALS — BP 137/61 | HR 71 | Temp 97.5°F | Resp 17

## 2023-08-27 DIAGNOSIS — N185 Chronic kidney disease, stage 5: Secondary | ICD-10-CM | POA: Diagnosis not present

## 2023-08-27 LAB — POCT HEMOGLOBIN-HEMACUE: Hemoglobin: 10.4 g/dL — ABNORMAL LOW (ref 12.0–15.0)

## 2023-08-27 MED ORDER — EPOETIN ALFA-EPBX 10000 UNIT/ML IJ SOLN
INTRAMUSCULAR | Status: AC
  Filled 2023-08-27: qty 2

## 2023-08-27 MED ORDER — EPOETIN ALFA-EPBX 10000 UNIT/ML IJ SOLN
20000.0000 [IU] | INTRAMUSCULAR | Status: DC
  Administered 2023-08-27: 20000 [IU] via SUBCUTANEOUS

## 2023-09-02 ENCOUNTER — Encounter: Payer: Medicare PPO | Admitting: Internal Medicine

## 2023-09-10 ENCOUNTER — Encounter (HOSPITAL_COMMUNITY)
Admission: RE | Admit: 2023-09-10 | Discharge: 2023-09-10 | Disposition: A | Discharge status: Home / Self Care | Payer: Medicare PPO | Source: Ambulatory Visit | Attending: Nephrology | Admitting: Nephrology

## 2023-09-10 VITALS — BP 158/70 | HR 74 | Temp 97.1°F | Resp 17

## 2023-09-10 DIAGNOSIS — N185 Chronic kidney disease, stage 5: Secondary | ICD-10-CM

## 2023-09-10 LAB — POCT HEMOGLOBIN-HEMACUE: Hemoglobin: 10.5 g/dL — ABNORMAL LOW (ref 12.0–15.0)

## 2023-09-10 LAB — IRON AND TIBC
Iron: 82 ug/dL (ref 28–170)
Saturation Ratios: 29 % (ref 10.4–31.8)
TIBC: 279 ug/dL (ref 250–450)
UIBC: 197 ug/dL

## 2023-09-10 LAB — FERRITIN: Ferritin: 338 ng/mL — ABNORMAL HIGH (ref 11–307)

## 2023-09-10 MED ORDER — EPOETIN ALFA-EPBX 10000 UNIT/ML IJ SOLN
INTRAMUSCULAR | Status: AC
  Filled 2023-09-10: qty 2

## 2023-09-10 MED ORDER — EPOETIN ALFA-EPBX 10000 UNIT/ML IJ SOLN
20000.0000 [IU] | INTRAMUSCULAR | Status: DC
  Administered 2023-09-10: 20000 [IU] via SUBCUTANEOUS

## 2023-09-14 ENCOUNTER — Other Ambulatory Visit: Payer: Self-pay

## 2023-09-14 DIAGNOSIS — Z79899 Other long term (current) drug therapy: Secondary | ICD-10-CM

## 2023-09-14 DIAGNOSIS — I6529 Occlusion and stenosis of unspecified carotid artery: Secondary | ICD-10-CM

## 2023-09-14 DIAGNOSIS — E1169 Type 2 diabetes mellitus with other specified complication: Secondary | ICD-10-CM

## 2023-09-14 MED ORDER — AMLODIPINE BESYLATE 10 MG PO TABS: 10.0000 mg | ORAL_TABLET | Freq: Every day | ORAL | 0 refills | Status: AC

## 2023-09-24 ENCOUNTER — Encounter (HOSPITAL_COMMUNITY)
Admission: RE | Admit: 2023-09-24 | Discharge: 2023-09-24 | Disposition: A | Discharge status: Home / Self Care | Payer: Medicare PPO | Source: Ambulatory Visit | Attending: Nephrology | Admitting: Nephrology

## 2023-09-24 VITALS — BP 146/68 | HR 70 | Temp 97.0°F | Resp 17

## 2023-09-24 DIAGNOSIS — N185 Chronic kidney disease, stage 5: Secondary | ICD-10-CM | POA: Diagnosis not present

## 2023-09-24 LAB — POCT HEMOGLOBIN-HEMACUE: Hemoglobin: 10.4 g/dL — ABNORMAL LOW (ref 12.0–15.0)

## 2023-09-24 MED ORDER — EPOETIN ALFA-EPBX 10000 UNIT/ML IJ SOLN: 20000.0000 [IU] | INTRAMUSCULAR | Status: DC

## 2023-09-24 MED ORDER — EPOETIN ALFA-EPBX 10000 UNIT/ML IJ SOLN
INTRAMUSCULAR | Status: AC
  Administered 2023-09-24: 20000 [IU] via SUBCUTANEOUS
  Filled 2023-09-24: qty 2

## 2023-10-02 ENCOUNTER — Other Ambulatory Visit: Payer: Self-pay | Admitting: Physician Assistant

## 2023-10-02 DIAGNOSIS — Z79899 Other long term (current) drug therapy: Secondary | ICD-10-CM

## 2023-10-02 DIAGNOSIS — E1169 Type 2 diabetes mellitus with other specified complication: Secondary | ICD-10-CM

## 2023-10-02 DIAGNOSIS — I6529 Occlusion and stenosis of unspecified carotid artery: Secondary | ICD-10-CM

## 2023-10-08 ENCOUNTER — Encounter (HOSPITAL_COMMUNITY)
Admission: RE | Admit: 2023-10-08 | Discharge: 2023-10-08 | Disposition: A | Discharge status: Home / Self Care | Source: Ambulatory Visit | Attending: Nephrology | Admitting: Nephrology

## 2023-10-08 VITALS — BP 167/93 | HR 81 | Temp 97.1°F | Resp 17

## 2023-10-08 DIAGNOSIS — N185 Chronic kidney disease, stage 5: Secondary | ICD-10-CM

## 2023-10-08 LAB — IRON AND TIBC
Iron: 68 ug/dL (ref 28–170)
Saturation Ratios: 27 % (ref 10.4–31.8)
TIBC: 248 ug/dL — ABNORMAL LOW (ref 250–450)
UIBC: 180 ug/dL

## 2023-10-08 LAB — FERRITIN: Ferritin: 276 ng/mL (ref 11–307)

## 2023-10-08 LAB — POCT HEMOGLOBIN-HEMACUE: Hemoglobin: 10.8 g/dL — ABNORMAL LOW (ref 12.0–15.0)

## 2023-10-08 MED ORDER — EPOETIN ALFA-EPBX 10000 UNIT/ML IJ SOLN
INTRAMUSCULAR | Status: AC
  Filled 2023-10-08: qty 2

## 2023-10-08 MED ORDER — EPOETIN ALFA-EPBX 10000 UNIT/ML IJ SOLN
20000.0000 [IU] | INTRAMUSCULAR | Status: DC
  Administered 2023-10-08: 20000 [IU] via SUBCUTANEOUS

## 2023-10-19 ENCOUNTER — Ambulatory Visit: Payer: Self-pay

## 2023-10-19 NOTE — Telephone Encounter (Signed)
 Chief Complaint: Bilateral leg swelling Symptoms: Wound on left leg, red, swollen, painful to touch 10/10, warm to touch  Frequency: Constant onset a few weeks ago  Pertinent Negatives: Patient denies fever, nausea, vomiting, chest pain, calf pain  Disposition: [] ED /[x] Urgent Care (no appt availability in office) / [] Appointment(In office/virtual)/ []  [facility]/ [] Home Care/ [] Refused Recommended Disposition /[] [facility]/ []  Follow-up with PCP Additional Notes: Patient's son states the patient has bilateral leg swelling  due to CKD but what is concerning to him is that the left leg has a wound on it near the chin. Patient states it is warm to touch, painful to touch 10/10 and red. No appointments are available in PCP office this week. Care advice was given and patient was advised to seek care at urgent care. Son stated he would take her to [facility]. Advised to callback if symptoms get worse. Patient verbalized understanding.  Copied from CRM [id number]. Topic: Clinical - Red Word Triage >> Oct 19, 2023 11:45 AM Everette Rank wrote: Red Word that prompted transfer to Nurse Triage: Patient Spot on LT leg/Both legs swollen she has had a stroke also Reason for Disposition  SEVERE leg swelling (e.g., swelling extends above knee, entire leg is swollen, weeping fluid)  Answer Assessment - Initial Assessment Questions 1. ONSET: "When did the swelling start?" (e.g., minutes, hours, days)     A few weeks ago  2. LOCATION: "What part of the leg is swollen?"  "Are both legs swollen or just one leg?"     Left leg  3. SEVERITY: "How bad is the swelling?" (e.g., localized; mild, moderate, severe)   - Localized: Small area of swelling localized to one leg.   - MILD pedal edema: Swelling limited to foot and ankle, pitting edema < 1/4 inch (6 mm) deep, rest and elevation eliminate most or all swelling.   - MODERATE edema: Swelling of lower leg to knee, pitting edema >  1/4 inch (6 mm) deep, rest and elevation only partially reduce swelling.   - SEVERE edema: Swelling extends above knee, facial or hand swelling present.      Severe  4. REDNESS: "Does the swelling look red or infected?"     Yes  5. PAIN: "Is the swelling painful to touch?" If Yes, ask: "How painful is it?"   (Scale 1-10; mild, moderate or severe)     10/10 6. FEVER: "Do you have a fever?" If Yes, ask: "What is it, how was it measured, and when did it start?"      No  7. CAUSE: "What do you think is causing the leg swelling?"     Kidney failure  8. MEDICAL HISTORY: "Do you have a history of blood clots (e.g., DVT), cancer, heart failure, kidney disease, or liver failure?"     Kidney disease  9. RECURRENT SYMPTOM: "Have you had leg swelling before?" If Yes, ask: "When was the last time?" "What happened that time?"     Yes  10. OTHER SYMPTOMS: "Do you have any other symptoms?" (e.g., chest pain, difficulty breathing)       Left leg wound, red, warm to touch  Protocols used: Leg Swelling and Edema-A-AH

## 2023-10-22 ENCOUNTER — Encounter (HOSPITAL_COMMUNITY)
Admission: RE | Admit: 2023-10-22 | Discharge: 2023-10-22 | Disposition: A | Discharge status: Home / Self Care | Source: Ambulatory Visit | Attending: Nephrology | Admitting: Nephrology

## 2023-10-22 VITALS — BP 158/76 | HR 78 | Temp 97.3°F | Resp 17

## 2023-10-22 DIAGNOSIS — N185 Chronic kidney disease, stage 5: Secondary | ICD-10-CM | POA: Insufficient documentation

## 2023-10-22 LAB — POCT HEMOGLOBIN-HEMACUE: Hemoglobin: 9.7 g/dL — ABNORMAL LOW (ref 12.0–15.0)

## 2023-10-22 MED ORDER — EPOETIN ALFA-EPBX 10000 UNIT/ML IJ SOLN
INTRAMUSCULAR | Status: AC
  Filled 2023-10-22: qty 2

## 2023-10-22 MED ORDER — EPOETIN ALFA-EPBX 10000 UNIT/ML IJ SOLN
20000.0000 [IU] | INTRAMUSCULAR | Status: DC
  Administered 2023-10-22: 20000 [IU] via SUBCUTANEOUS

## 2023-11-05 ENCOUNTER — Encounter (HOSPITAL_COMMUNITY)
Admission: RE | Admit: 2023-11-05 | Discharge: 2023-11-05 | Disposition: A | Discharge status: Home / Self Care | Source: Ambulatory Visit | Attending: Nephrology | Admitting: Nephrology

## 2023-11-05 VITALS — BP 141/68 | HR 76 | Temp 97.2°F | Resp 17

## 2023-11-05 DIAGNOSIS — N185 Chronic kidney disease, stage 5: Secondary | ICD-10-CM

## 2023-11-05 LAB — IRON AND TIBC
Iron: 87 ug/dL (ref 28–170)
Saturation Ratios: 40 % — ABNORMAL HIGH (ref 10.4–31.8)
TIBC: 218 ug/dL — ABNORMAL LOW (ref 250–450)
UIBC: 131 ug/dL

## 2023-11-05 LAB — FERRITIN: Ferritin: 499 ng/mL — ABNORMAL HIGH (ref 11–307)

## 2023-11-05 LAB — POCT HEMOGLOBIN-HEMACUE: Hemoglobin: 10 g/dL — ABNORMAL LOW (ref 12.0–15.0)

## 2023-11-05 MED ORDER — EPOETIN ALFA 20000 UNIT/ML IJ SOLN
INTRAMUSCULAR | Status: AC
Start: 2023-11-05 — End: 2023-11-05
  Filled 2023-11-05: qty 1

## 2023-11-05 MED ORDER — EPOETIN ALFA 20000 UNIT/ML IJ SOLN
20000.0000 [IU] | INTRAMUSCULAR | Status: DC
  Administered 2023-11-05: 20000 [IU] via SUBCUTANEOUS

## 2023-11-10 ENCOUNTER — Telehealth: Payer: Self-pay

## 2023-11-10 ENCOUNTER — Ambulatory Visit: Payer: Medicare PPO

## 2023-11-10 VITALS — Ht 66.0 in | Wt 159.0 lb

## 2023-11-10 DIAGNOSIS — Z0000 Encounter for general adult medical examination without abnormal findings: Secondary | ICD-10-CM

## 2023-11-10 NOTE — Patient Instructions (Addendum)
 Gina Bailey , Thank you for taking time to come for your Medicare Wellness Visit. I appreciate your ongoing commitment to your health goals. Please review the following plan we discussed and let me know if I can assist you in the future.   Referrals/Orders/Follow-Ups/Clinician Recommendations: Yes, keep maintaining your health by keeping your appointments with Dr.  Sherol Dixie and any specialists that you may see.  Call us  if you need anything.  Have a great year!!!!  This is a list of the screening recommended for you and due dates:  Health Maintenance  Topic Date Due   Zoster (Shingles) Vaccine (1 of 2) Never done   DTaP/Tdap/Td vaccine (2 - Tdap) 06/03/2020   Hemoglobin A1C  07/09/2022   Complete foot exam   03/07/2023   Yearly kidney health urinalysis for diabetes  08/28/2027*   Eye exam for diabetics  11/12/2023   Flu Shot  02/18/2024   Yearly kidney function blood test for diabetes  04/22/2024   Medicare Annual Wellness Visit  11/09/2024   Pneumonia Vaccine  Completed   DEXA scan (bone density measurement)  Completed   Hepatitis C Screening  Completed   HPV Vaccine  Aged Out   Meningitis B Vaccine  Aged Out   Colon Cancer Screening  Discontinued   COVID-19 Vaccine  Discontinued  *Topic was postponed. The date shown is not the original due date.    Advanced directives: (In Chart) A copy of your advanced directives are scanned into your chart should your provider ever need it.  Next Medicare Annual Wellness Visit scheduled for next year: Yes, It was nice speaking with you today! Your next Annual Wellness Visit is scheduled for 11/15/2024 at 11:10 a.m. via VIDEO VISIT. If you need to reschedule or cancel, please call [phone].

## 2023-11-10 NOTE — Telephone Encounter (Signed)
 Spoke with patient's son [name] during [place] today and he stated that there are several issues going on with his mother that he is very concerned about.  Son stated that there is a possible wound starting to form on her left leg, its sore to the touch, no color change, no oozing and has ben there for 2+ weeks.  Also the patient's toenails are very long and her feet are swollen and dark in color.  Son stated when he touches her feet the patient will jump or say oh!  Patient is also not urinating a lot but is having bouts of diarrhea. She is totally incontinent and son is not able to afford Depends.  Out of all his siblings, [name] is the sole provider for his mother.  No other siblings assist him with their mother.  An appoint ment was made in June (which was the first available).  Is it possible that patient can be seen by another provider sooner or video visit.  Please advise.  [name], LPN [facility] Annual Wellness Team Direct Dial: [phone]

## 2023-11-10 NOTE — Progress Notes (Signed)
 Because this visit was a virtual/telehealth visit,  certain criteria was not obtained, such a blood pressure, CBG if applicable, and timed get up and go. Any medications not marked as "taking" were not mentioned during the medication reconciliation part of the visit. Any vitals not documented were not able to be obtained due to this being a telehealth visit or patient was unable to self-report a recent blood pressure reading due to a lack of equipment at home via telehealth. Vitals that have been documented are verbally provided by the patient.   Subjective:   Gina Bailey is a 78 y.o. who presents for a Medicare Wellness preventive visit.  Visit Complete: Virtual I connected with  Gina Bailey on 11/10/23 by a video and audio enabled telemedicine application and verified that I am speaking with the correct person using two identifiers.  Patient Location: Home  Provider Location: Office/Clinic  I discussed the limitations of evaluation and management by telemedicine. The patient expressed understanding and agreed to proceed.  Vital Signs: Because this visit was a virtual/telehealth visit, some criteria may be missing or patient reported. Any vitals not documented were not able to be obtained and vitals that have been documented are patient reported.  Persons Participating in Visit:  [name] (son) and patient was present during visit.  AWV Questionnaire: No: Patient Medicare AWV questionnaire was not completed prior to this visit.  Cardiac Risk Factors include: advanced age (>26men, >91 women);dyslipidemia;hypertension;obesity (BMI >30kg/m2);sedentary lifestyle     Objective:    Today's Vitals   11/10/23 1112  Weight: 159 lb (72.1 kg)  Height: 5\' 6"  (1.676 m)  PainLoc: Leg   Body mass index is 25.66 kg/m.     11/10/2023   11:16 AM 12/10/2022   11:27 AM 10/08/2022   11:20 AM 08/27/2022   11:43 AM 08/17/2022   11:03 AM 07/30/2022   10:20 AM 07/30/2022    9:38 AM  Advanced  Directives  Does Patient Have a Medical Advance Directive? Yes Yes Yes Yes Yes Yes Yes  Type of Estate agent of [place];Living will Healthcare Power of [place];Living will Healthcare Power of [place];Living will Healthcare Power of [employer] Power of [employer] Power of [employer] Power of Attorney  Does patient want to make changes to medical advance directive? No - Patient declined No - Patient declined No - Patient declined No - Patient declined     Copy of Healthcare Power of Attorney in Chart? Yes - validated most recent copy scanned in chart (See row information) Yes - validated most recent copy scanned in chart (See row information) Yes - validated most recent copy scanned in chart (See row information) Yes - validated most recent copy scanned in chart (See row information)       Current Medications (verified) Outpatient Encounter Medications as of 11/10/2023  Medication Sig   acetaminophen  (TYLENOL ) 325 MG tablet Take 1-2 tablets (325-650 mg total) by mouth every 4 (four) hours as needed for mild pain.   amLODipine  (NORVASC ) 10 MG tablet Take 1 tablet (10 mg total) by mouth daily.   apixaban  (ELIQUIS ) 5 MG TABS tablet TAKE 1 TABLET(5 MG) BY MOUTH TWICE DAILY   calcitRIOL  (ROCALTROL ) 0.25 MCG capsule Take 1 capsule (0.25 mcg total) by mouth 3 (three) times a week.   carvedilol  (COREG ) 6.25 MG tablet Take 1 tablet (6.25 mg total) by mouth 2 (two) times daily with a meal.   docusate sodium  (COLACE) 100 MG capsule Take 100 mg by  mouth daily.   furosemide  (LASIX ) 80 MG tablet Take 1 tablet (80 mg total) by mouth 3 (three) times a week. M/W/F as needed for swellling per Dr. Yvonnie Heritage   potassium chloride  (MICRO-K ) 10 MEQ CR capsule Take 10 mEq by mouth daily.   rosuvastatin  (CRESTOR ) 20 MG tablet Take 1 tablet (20 mg total) by mouth daily.   No facility-administered encounter medications on file as of 11/10/2023.    Allergies  (verified) Metformin  and related, Pineapple, and Zestril  [lisinopril ]   History: Past Medical History:  Diagnosis Date   Chronic kidney disease    Diabetes mellitus    Diet-controlled. Not on antiglycemic medications.   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  Based on the size, patient's age, status as a former smoker, and its discovery in a community setting, her probability of malignancy is 3% (low probability).  Based on this, I recommend follow-up with a CT scan around 05/2014, and if lesion is stable or smaller, no further follow-up is needed.  CT Chest 06/2015: Stable 3mm pulmonary nodule. No   Ventral hernia    Incisional ventral hernia. S/P repair by Dr. 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 Mammie Sears. (08/2006)   Past Surgical History:  Procedure Laterality Date   APPENDECTOMY     AV FISTULA PLACEMENT Left 03/10/2022   Procedure: LEFT ARM ARTERIOVENOUS (AV) FISTULA CREATION VERSUS GRAFT;  Surgeon: Adine Hoof, MD;  Location: [facility];  Service: Vascular;  Laterality: Left;   HEMORRHOID SURGERY     hemorrhoidectomy   RIGHT COLECTOMY Right 08/20/2006   Partial right colectomy and anastomosis with resection of villous adenoma.   TONSILLECTOMY     TUBAL LIGATION     VENTRAL HERNIA REPAIR  08/20/2008   By Dr. Mammie Sears.   Family History  Problem Relation Age of Onset   Diabetes Other        1st degree reative in 3 of her siblings   Breast cancer Mother        not sure of age   Social History   Socioeconomic History   Marital status: Widowed     Spouse name: Not on file   Number of children: Not on file   Years of education: Not on file   Highest education level: Not on file  Occupational History   Occupation: [employer]    Employer: [employer]    Comment: cafeteria   Occupation: Volunteer    Comment: [employer]  Tobacco Use   Smoking status: Former    Current packs/day: 0.00    Average packs/day: 0.1 packs/day for 6.0 years (0.3 ttl pk-yrs)    Types: Cigarettes    Start date: 09/04/1993    Quit date: 09/05/1999    Years since quitting: 24.1   Smokeless tobacco: Never  Substance and Sexual Activity   Alcohol use: No    Alcohol/week: 0.0 standard drinks of alcohol   Drug use: No   Sexual activity: Not Currently  Other Topics  Concern   Not on file  Social History Narrative   Retired from working as a [employer]   Widowed. (since 1997). Not sexually active since.   Volunteers at [employer].   Social Drivers of Corporate investment banker Strain: Low Risk  (11/10/2023)   Overall Financial Resource Strain (CARDIA)    Difficulty of Paying Living Expenses: Not hard at all  Food Insecurity: No Food Insecurity (11/10/2023)   Hunger Vital Sign    Worried About Running Out of Food in the Last Year: Never true    Ran Out of Food in the Last Year: Never true  Transportation Needs: No Transportation Needs (11/10/2023)   PRAPARE - Administrator, Civil Service (Medical): No    Lack of Transportation (Non-Medical): No  Physical Activity: Inactive (11/10/2023)   Exercise Vital Sign    Days of Exercise per Week: 0 days    Minutes of Exercise per Session: 0 min  Stress: No Stress Concern Present (11/10/2023)   [employer] of Occupational Health - Occupational Stress Questionnaire    Feeling of Stress : Not at all  Social Connections: Socially Isolated (11/10/2023)   Social Connection and Isolation Panel [NHANES]    Frequency of Communication with Friends and Family:  Once a week    Frequency of Social Gatherings with Friends and Family: Once a week    Attends Religious Services: Never    Database administrator or Organizations: No    Attends Banker Meetings: Never    Marital Status: Widowed    Tobacco Counseling Counseling given: Not Answered    Clinical Intake:  Pre-visit preparation completed: Yes  Pain : 0-10 Pain Type: Chronic pain Pain Location: Leg Pain Orientation: Left, Right Pain Descriptors / Indicators: Other (Comment) (swollen since last stroke) Pain Onset: More than a month ago Pain Frequency: Constant     BMI - recorded: 25.66 Nutritional Status: BMI 25 -29 Overweight Nutritional Risks: None Diabetes: Yes (diet controlled, no meds) CBG done?: No Did pt. bring in CBG monitor from home?: No  Lab Results  Component Value Date   HGBA1C 5.4 04/09/2022   HGBA1C 6.5 (A) 07/03/2021   HGBA1C 6.8 (A) 03/26/2021     How often do you need to have someone help you when you read instructions, pamphlets, or other written materials from your doctor or pharmacy?: 5 - Always  Interpreter Needed?: No  Information entered by :: Druscilla Gerhard, LPN.   Activities of Daily Living     11/10/2023   11:18 AM 12/10/2022   11:28 AM  In your present state of health, do you have any difficulty performing the following activities:  Hearing? 1 0  Vision? 0 1  Difficulty concentrating or making decisions? 1 1  Walking or climbing stairs? 1 1  Dressing or bathing? 1 1  Doing errands, shopping? 1 1  Preparing Food and eating ? Y   Using the Toilet? Y   In the past six months, have you accidently leaked urine? Y   Do you have problems with loss of bowel control? Y   Managing your Medications? Y   Managing your Finances? Y   Housekeeping or managing your Housekeeping? Y     Patient Care Team: Sherol Dixie, MD as PCP - General (Internal Medicine) Maris Sickle, MD as Consulting Physician  (Ophthalmology)  Indicate any recent Medical Services you may have received from other than [facility] providers in the past year (date  may be approximate).     Assessment:   This is a routine wellness examination for Janiaya.  Hearing/Vision screen Hearing Screening - Comments:: Patient has hearing difficulties and refuses to wear hearing aids.  Vision Screening - Comments:: Wears reading  glasses - up to date with routine eye exams with [facility]    Goals Addressed             This Visit's Progress    Client understands the importance of follow-up with providers by attending scheduled visits         Depression Screen     11/10/2023   11:21 AM 12/10/2022   11:29 AM 10/08/2022    1:45 PM 08/27/2022   11:44 AM 05/22/2022   11:38 AM 05/22/2022   11:25 AM 05/12/2022    2:45 PM  PHQ 2/9 Scores  PHQ - 2 Score  0 0 0 0 0 0  PHQ- 9 Score     0  2  Exception Documentation Other- indicate reason in comment box        Not completed due to memory          Fall Risk     11/10/2023   11:16 AM 12/10/2022   11:28 AM 10/08/2022   11:19 AM 08/27/2022   11:42 AM 08/20/2022    2:51 PM  Fall Risk   Falls in the past year? 0 0 0 0 0  Number falls in past yr: 0 0 0 0   Injury with Fall? 0 0 0 0   Risk for fall due to : No Fall Risks      Follow up Falls prevention discussed;Falls evaluation completed Falls evaluation completed Falls evaluation completed Falls evaluation completed     MEDICARE RISK AT HOME:  Medicare Risk at Home Any stairs in or around the home?: No If so, are there any without handrails?: No Home free of loose throw rugs in walkways, pet beds, electrical cords, etc?: Yes Adequate lighting in your home to reduce risk of falls?: Yes Life alert?: Yes (does not wear it) Use of a cane, walker or w/c?: Yes Grab bars in the bathroom?: Yes Shower chair or bench in shower?: Yes Elevated toilet seat or a handicapped toilet?: Yes  TIMED UP AND GO:  Was the test performed?   No  Cognitive Function: Declined: Patient declined cognitive screening, but was able to answer questions in an accurate and timely manner. No cognitive impairments observed.    11/10/2023   11:41 AM  MMSE - Mini Mental State Exam  Not completed: Unable to complete        Immunizations Immunization History  Administered Date(s) Administered   Fluad Quad(high Dose 65+) 04/07/2019, 03/26/2021, 04/09/2022   Influenza Split 04/19/2012, 04/17/2020   Influenza Whole 06/07/2008, 04/10/2009, 05/01/2010, 03/30/2011   Influenza,inj,Quad PF,6+ Mos 04/24/2013, 05/22/2014, 06/04/2015, 03/26/2016, 05/11/2017, 04/19/2018   Influenza-Unspecified 04/23/2020   PFIZER(Purple Top)SARS-COV-2 Vaccination 08/09/2019, 08/30/2019, 06/11/2020   Pneumococcal Conjugate-13 06/04/2015   Pneumococcal Polysaccharide-23 06/07/2008, 06/06/2013   Td 06/03/2010    Screening Tests Health Maintenance  Topic Date Due   Zoster Vaccines- Shingrix (1 of 2) Never done   DTaP/Tdap/Td (2 - Tdap) 06/03/2020   HEMOGLOBIN A1C  07/09/2022   FOOT EXAM  03/07/2023   Diabetic kidney evaluation - Urine ACR  08/28/2027 (Originally 05/08/2021)   OPHTHALMOLOGY EXAM  11/12/2023   INFLUENZA VACCINE  02/18/2024   Diabetic kidney evaluation - eGFR measurement  04/22/2024   Medicare Annual Wellness (AWV)  11/09/2024   Pneumonia Vaccine 42+ Years old  Completed   DEXA SCAN  Completed   Hepatitis C Screening  Completed   HPV VACCINES  Aged Out   Meningococcal B Vaccine  Aged Out   Colonoscopy  Discontinued   COVID-19 Vaccine  Discontinued    Health Maintenance  Health Maintenance Due  Topic Date Due   Zoster Vaccines- Shingrix (1 of 2) Never done   DTaP/Tdap/Td (2 - Tdap) 06/03/2020   HEMOGLOBIN A1C  07/09/2022   FOOT EXAM  03/07/2023   Health Maintenance Items Addressed: Yes, Patient is due for the following: Dtap, HgA1C, Shingrix, Flu and Foot Exam.  Additional Screening:  Vision Screening: Recommended annual  ophthalmology exams for early detection of glaucoma and other disorders of the eye.  Dental Screening: Recommended annual dental exams for proper oral hygiene  Community Resource Referral / Chronic Care Management: CRR required this visit?  No   CCM required this visit?  No     Plan:     I have personally reviewed and noted the following in the patient's chart:   Medical and social history Use of alcohol, tobacco or illicit drugs  Current medications and supplements including opioid prescriptions. Patient is not currently taking opioid prescriptions. Functional ability and status Nutritional status Physical activity Advanced directives List of other physicians Hospitalizations, surgeries, and ER visits in previous 12 months Vitals Screenings to include cognitive, depression, and falls Referrals and appointments  In addition, I have reviewed and discussed with patient certain preventive protocols, quality metrics, and best practice recommendations. A written personalized care plan for preventive services as well as general preventive health recommendations were provided to patient.     Margette Sheldon, LPN   10/26/8117   After Visit Summary: (MyChart) Due to this being a telephonic visit, the after visit summary with patients personalized plan was offered to patient via MyChart   Notes: Please refer to Routing Comments.

## 2023-11-11 NOTE — Progress Notes (Signed)
 Awv reviewed.  Gina Bailey is being treated conservatively for end stage renal disease having elected to forego HD. Her life expectancy is limited most likely < 1 year; she is Hospice eligible.  Many of the common screening interventions we would recommend will not have an indication as her management is symptomatic and not life-prolonging.  I will have our office reach out to schedule her an appt with me or another physician.

## 2023-11-16 ENCOUNTER — Emergency Department (HOSPITAL_COMMUNITY)

## 2023-11-16 ENCOUNTER — Inpatient Hospital Stay (HOSPITAL_COMMUNITY)
Admission: EM | Admit: 2023-11-16 | Discharge: 2023-11-19 | DRG: 640 | Disposition: A | Discharge status: Hospice | Attending: Internal Medicine | Admitting: Internal Medicine

## 2023-11-16 DIAGNOSIS — Z7901 Long term (current) use of anticoagulants: Secondary | ICD-10-CM

## 2023-11-16 DIAGNOSIS — N189 Chronic kidney disease, unspecified: Secondary | ICD-10-CM | POA: Diagnosis not present

## 2023-11-16 DIAGNOSIS — R011 Cardiac murmur, unspecified: Secondary | ICD-10-CM | POA: Diagnosis present

## 2023-11-16 DIAGNOSIS — Z803 Family history of malignant neoplasm of breast: Secondary | ICD-10-CM

## 2023-11-16 DIAGNOSIS — F0390 Unspecified dementia without behavioral disturbance: Secondary | ICD-10-CM | POA: Diagnosis present

## 2023-11-16 DIAGNOSIS — I480 Paroxysmal atrial fibrillation: Secondary | ICD-10-CM | POA: Diagnosis present

## 2023-11-16 DIAGNOSIS — W19XXXA Unspecified fall, initial encounter: Principal | ICD-10-CM

## 2023-11-16 DIAGNOSIS — E1122 Type 2 diabetes mellitus with diabetic chronic kidney disease: Secondary | ICD-10-CM | POA: Diagnosis present

## 2023-11-16 DIAGNOSIS — Z888 Allergy status to other drugs, medicaments and biological substances status: Secondary | ICD-10-CM

## 2023-11-16 DIAGNOSIS — Z833 Family history of diabetes mellitus: Secondary | ICD-10-CM | POA: Diagnosis not present

## 2023-11-16 DIAGNOSIS — I672 Cerebral atherosclerosis: Secondary | ICD-10-CM | POA: Diagnosis not present

## 2023-11-16 DIAGNOSIS — I6782 Cerebral ischemia: Secondary | ICD-10-CM | POA: Diagnosis not present

## 2023-11-16 DIAGNOSIS — N186 End stage renal disease: Secondary | ICD-10-CM | POA: Diagnosis present

## 2023-11-16 DIAGNOSIS — M1030 Gout due to renal impairment, unspecified site: Secondary | ICD-10-CM | POA: Diagnosis present

## 2023-11-16 DIAGNOSIS — S199XXA Unspecified injury of neck, initial encounter: Secondary | ICD-10-CM | POA: Diagnosis not present

## 2023-11-16 DIAGNOSIS — E785 Hyperlipidemia, unspecified: Secondary | ICD-10-CM | POA: Diagnosis present

## 2023-11-16 DIAGNOSIS — Z860101 Personal history of adenomatous and serrated colon polyps: Secondary | ICD-10-CM

## 2023-11-16 DIAGNOSIS — Z79899 Other long term (current) drug therapy: Secondary | ICD-10-CM

## 2023-11-16 DIAGNOSIS — I129 Hypertensive chronic kidney disease with stage 1 through stage 4 chronic kidney disease, or unspecified chronic kidney disease: Secondary | ICD-10-CM | POA: Diagnosis not present

## 2023-11-16 DIAGNOSIS — R404 Transient alteration of awareness: Secondary | ICD-10-CM | POA: Diagnosis not present

## 2023-11-16 DIAGNOSIS — Z043 Encounter for examination and observation following other accident: Secondary | ICD-10-CM | POA: Diagnosis not present

## 2023-11-16 DIAGNOSIS — D631 Anemia in chronic kidney disease: Secondary | ICD-10-CM | POA: Diagnosis present

## 2023-11-16 DIAGNOSIS — Z91018 Allergy to other foods: Secondary | ICD-10-CM | POA: Diagnosis not present

## 2023-11-16 DIAGNOSIS — Z992 Dependence on renal dialysis: Secondary | ICD-10-CM | POA: Diagnosis not present

## 2023-11-16 DIAGNOSIS — S0990XA Unspecified injury of head, initial encounter: Secondary | ICD-10-CM | POA: Diagnosis not present

## 2023-11-16 DIAGNOSIS — N390 Urinary tract infection, site not specified: Secondary | ICD-10-CM | POA: Diagnosis present

## 2023-11-16 DIAGNOSIS — S0083XA Contusion of other part of head, initial encounter: Secondary | ICD-10-CM | POA: Diagnosis present

## 2023-11-16 DIAGNOSIS — R000 Tachycardia, unspecified: Secondary | ICD-10-CM | POA: Diagnosis not present

## 2023-11-16 DIAGNOSIS — Y92002 Bathroom of unspecified non-institutional (private) residence single-family (private) house as the place of occurrence of the external cause: Secondary | ICD-10-CM

## 2023-11-16 DIAGNOSIS — S0993XA Unspecified injury of face, initial encounter: Secondary | ICD-10-CM | POA: Diagnosis not present

## 2023-11-16 DIAGNOSIS — R55 Syncope and collapse: Secondary | ICD-10-CM | POA: Diagnosis not present

## 2023-11-16 DIAGNOSIS — R296 Repeated falls: Secondary | ICD-10-CM | POA: Diagnosis present

## 2023-11-16 DIAGNOSIS — E872 Acidosis, unspecified: Secondary | ICD-10-CM | POA: Diagnosis present

## 2023-11-16 DIAGNOSIS — I120 Hypertensive chronic kidney disease with stage 5 chronic kidney disease or end stage renal disease: Secondary | ICD-10-CM | POA: Diagnosis present

## 2023-11-16 DIAGNOSIS — I10 Essential (primary) hypertension: Secondary | ICD-10-CM | POA: Diagnosis not present

## 2023-11-16 DIAGNOSIS — I517 Cardiomegaly: Secondary | ICD-10-CM | POA: Diagnosis not present

## 2023-11-16 DIAGNOSIS — Z66 Do not resuscitate: Secondary | ICD-10-CM | POA: Diagnosis present

## 2023-11-16 DIAGNOSIS — D638 Anemia in other chronic diseases classified elsewhere: Secondary | ICD-10-CM | POA: Diagnosis not present

## 2023-11-16 DIAGNOSIS — S0003XA Contusion of scalp, initial encounter: Secondary | ICD-10-CM | POA: Diagnosis not present

## 2023-11-16 DIAGNOSIS — M47812 Spondylosis without myelopathy or radiculopathy, cervical region: Secondary | ICD-10-CM | POA: Diagnosis not present

## 2023-11-16 LAB — COMPREHENSIVE METABOLIC PANEL WITH GFR
ALT: 13 U/L (ref 0–44)
AST: 14 U/L — ABNORMAL LOW (ref 15–41)
Albumin: 2.9 g/dL — ABNORMAL LOW (ref 3.5–5.0)
Alkaline Phosphatase: 44 U/L (ref 38–126)
Anion gap: 19 — ABNORMAL HIGH (ref 5–15)
BUN: 81 mg/dL — ABNORMAL HIGH (ref 8–23)
CO2: 14 mmol/L — ABNORMAL LOW (ref 22–32)
Calcium: 6 mg/dL — CL (ref 8.9–10.3)
Chloride: 108 mmol/L (ref 98–111)
Creatinine, Ser: 10.89 mg/dL — ABNORMAL HIGH (ref 0.44–1.00)
GFR, Estimated: 3 mL/min — ABNORMAL LOW (ref >60)
Glucose, Bld: 104 mg/dL — ABNORMAL HIGH (ref 70–99)
Potassium: 4.3 mmol/L (ref 3.5–5.1)
Sodium: 141 mmol/L (ref 135–145)
Total Bilirubin: 0.9 mg/dL (ref 0.0–1.2)
Total Protein: 6.2 g/dL — ABNORMAL LOW (ref 6.5–8.1)

## 2023-11-16 LAB — I-STAT CHEM 8, ED
BUN: 77 mg/dL — ABNORMAL HIGH (ref 8–23)
Calcium, Ion: 0.72 mmol/L — CL (ref 1.15–1.40)
Chloride: 112 mmol/L — ABNORMAL HIGH (ref 98–111)
Creatinine, Ser: 11.5 mg/dL — ABNORMAL HIGH (ref 0.44–1.00)
Glucose, Bld: 114 mg/dL — ABNORMAL HIGH (ref 70–99)
HCT: 31 % — ABNORMAL LOW (ref 36.0–46.0)
Hemoglobin: 10.5 g/dL — ABNORMAL LOW (ref 12.0–15.0)
Potassium: 4.2 mmol/L (ref 3.5–5.1)
Sodium: 142 mmol/L (ref 135–145)
TCO2: 15 mmol/L — ABNORMAL LOW (ref 22–32)

## 2023-11-16 LAB — I-STAT CG4 LACTIC ACID, ED: Lactic Acid, Venous: 1.4 mmol/L (ref 0.5–1.9)

## 2023-11-16 LAB — URINALYSIS, ROUTINE W REFLEX MICROSCOPIC
Bilirubin Urine: NEGATIVE
Glucose, UA: 50 mg/dL — AB
Ketones, ur: 5 mg/dL — AB
Nitrite: NEGATIVE
Protein, ur: >=300 mg/dL — AB
Specific Gravity, Urine: 1.01 (ref 1.005–1.030)
pH: 6 (ref 5.0–8.0)

## 2023-11-16 LAB — PROTIME-INR
INR: 1.7 — ABNORMAL HIGH (ref 0.8–1.2)
Prothrombin Time: 20.2 s — ABNORMAL HIGH (ref 11.4–15.2)

## 2023-11-16 LAB — CBC
HCT: 34.2 % — ABNORMAL LOW (ref 36.0–46.0)
Hemoglobin: 10.2 g/dL — ABNORMAL LOW (ref 12.0–15.0)
MCH: 26.2 pg (ref 26.0–34.0)
MCHC: 29.8 g/dL — ABNORMAL LOW (ref 30.0–36.0)
MCV: 87.9 fL (ref 80.0–100.0)
Platelets: 184 10*3/uL (ref 150–400)
RBC: 3.89 MIL/uL (ref 3.87–5.11)
RDW: 17.5 % — ABNORMAL HIGH (ref 11.5–15.5)
WBC: 7.7 10*3/uL (ref 4.0–10.5)
nRBC: 0 % (ref 0.0–0.2)

## 2023-11-16 LAB — CBG MONITORING, ED: Glucose-Capillary: 117 mg/dL — ABNORMAL HIGH (ref 70–99)

## 2023-11-16 LAB — ETHANOL: Alcohol, Ethyl (B): <15 mg/dL (ref <15)

## 2023-11-16 MED ORDER — CALCIUM GLUCONATE-NACL 1-0.675 GM/50ML-% IV SOLN
1.0000 g | Freq: Once | INTRAVENOUS | Status: AC
  Administered 2023-11-16: 1000 mg via INTRAVENOUS
  Filled 2023-11-16: qty 50

## 2023-11-16 MED ORDER — ROSUVASTATIN CALCIUM 20 MG PO TABS
20.0000 mg | ORAL_TABLET | Freq: Every day | ORAL | Status: DC
  Administered 2023-11-17 – 2023-11-19 (×3): 20 mg via ORAL
  Filled 2023-11-16 (×3): qty 1

## 2023-11-16 MED ORDER — CARVEDILOL 6.25 MG PO TABS
6.2500 mg | ORAL_TABLET | Freq: Two times a day (BID) | ORAL | Status: DC
  Administered 2023-11-17 – 2023-11-19 (×5): 6.25 mg via ORAL
  Filled 2023-11-16 (×5): qty 1

## 2023-11-16 MED ORDER — SODIUM BICARBONATE 650 MG PO TABS
650.0000 mg | ORAL_TABLET | Freq: Once | ORAL | Status: AC
  Administered 2023-11-16: 650 mg via ORAL
  Filled 2023-11-16: qty 1

## 2023-11-16 MED ORDER — SENNOSIDES-DOCUSATE SODIUM 8.6-50 MG PO TABS: 1.0000 | ORAL_TABLET | Freq: Every evening | ORAL | Status: DC | PRN

## 2023-11-16 MED ORDER — ACETAMINOPHEN 650 MG RE SUPP: 650.0000 mg | Freq: Four times a day (QID) | RECTAL | Status: DC | PRN

## 2023-11-16 MED ORDER — ACETAMINOPHEN 325 MG PO TABS: 650.0000 mg | ORAL_TABLET | Freq: Four times a day (QID) | ORAL | Status: DC | PRN

## 2023-11-16 MED ORDER — AMLODIPINE BESYLATE 10 MG PO TABS
10.0000 mg | ORAL_TABLET | Freq: Every day | ORAL | Status: DC
  Administered 2023-11-17 – 2023-11-19 (×3): 10 mg via ORAL
  Filled 2023-11-16 (×3): qty 1

## 2023-11-16 MED ORDER — SODIUM CHLORIDE 0.9 % IV SOLN: 1.0000 mg/kg/h | INTRAVENOUS | Status: DC

## 2023-11-16 MED ORDER — APIXABAN 2.5 MG PO TABS
2.5000 mg | ORAL_TABLET | Freq: Two times a day (BID) | ORAL | Status: DC
  Administered 2023-11-16 – 2023-11-19 (×6): 2.5 mg via ORAL
  Filled 2023-11-16 (×6): qty 1

## 2023-11-16 NOTE — Progress Notes (Signed)
 Orthopedic Tech Progress Note Patient Details:  Gina Bailey 11-14-1945 [id number] Level 2 trauma    Patient ID: [name], female   DOB: 05-Aug-1945, 78 y.o.   MRN: [id number]  Kermitt Pedlar 11/16/2023, 3:04 PM

## 2023-11-16 NOTE — ED Triage Notes (Signed)
 Pt bib [facility] coming from home. EMS reports patient was found in bathroom by family with her face to the wall, on her knees. EMS states that upon arrival, pt was snoring/sleeping. Notable hematoma to top of head. Pt is on eliquis  for a-fib. Pt does have hx of dementia and was put in bilateral restraints by EMS due to patient trying to hit herself and jump out the stressed. Pt left arm restricted due to newly placed fistula, EMS reports pt has not started dialysis yet. Pt in c-collar upon arrival, EMS reports [facility] 11.

## 2023-11-16 NOTE — ED Notes (Signed)
 Pt son (number listed in chart) would like update when pt is updated.

## 2023-11-16 NOTE — ED Notes (Addendum)
 Unsuccessful attempt at obtaining urine sample via in & out catheter. Pt voided as in and out catheter was being set-up. Pt encouraged to void again when able to. Family educated on delay at this time. EDP notified of delay.

## 2023-11-16 NOTE — Hospital Course (Addendum)
 Gina Bailey is a 78 y.o. female with a past medical history of ESRD, hypertension, type 2 diabetes mellitus who presents after having a fall at home.  Patient found to have worsening renal function, was kyperkalemic, hyperphosphatemic, and hypocalcemic. She received 3g of IV calcium  gluconate, TUMS, and sevelamer  during her hospitalization. Her Calcium  corrected is 8.2 at discharge. K WNL, Phosphorus decreased from >30 to 11. Being discharged with phoslo  667mg  TID with meals and Calcitriol  0.25mcg daily. She has been largely asymptomatic.   -Please consider following up with an RFP at FU    Goals of care  Discussed with given her general deconditioning and worsening kidney function, that hospice would be an appropriate option for him.  Son has been currently overwhelmed but not willing to fully let go of her care.  He is open to having hospice at home.  TOC was consulted to help with the transition. -Please follow-up and make sure that he has been set up with hospice care.   Deconditioning UTI Gina Bailey in her bathroom, on Eliquis  for her Afib. Trauma workup was negative. Unclear mechanism of fall, but likely due to poor mobility in the setting of advanced dementia and postural instability.  UTI had rare bacteria and leukocytosis.  She was tender to palpation in the suprapubic region at admission.  She was given 1 dose of ceftriaxone  and is discharged on cefuroxime .  - PT/OT    Anion gap metabolic acidosis Likely secondary to ESRD. Will check a lactic. Gave sodium bicarb 650 mg once.  Please continue to follow-up outpatient and consider bicarbonate supplementation if needed.   Normocytic anemia, likely secondary to chronic disease Hemoglobin 10.5, stable around baseline. Normocytic. Likely anemia of chronic disease in the setting of kidney disease.     Chronic conditions: Hypertension Blood pressure elevated at 164/82 on presentation.  Asymptomatic from a hypertension standpoint.  ESRD likely  contributory.  On amlodipine  10 mg, carvedilol  6.25 mg twice daily. Continued these medications.    Paroxysmal atrial fibrillation Sinus rhythm on presentation.  Takes Eliquis  5 mg twice daily at home for anticoagulation.  Hemodynamically stable.  - Eliquis  dose reduced to 2.5 mg BID given age and creatinine   Mixed vascular and neurodegenerative dementia Baseline poor memory, disoriented, cannot manage her ADLs alone. She has been living with her son/primary caregiver. No acute changes.    Type 2 diabetes mellitus Well-controlled. Hemoglobin A1c was 5.4 one year ago. Not on any antiglycemic agents. On crestor  20 mg daily, continued.

## 2023-11-16 NOTE — H&P (Cosign Needed Addendum)
 Date: 11/16/2023               Patient Name:  Gina Bailey MRN: [id number]  DOB: [birth year] Age / Sex: 78 y.o., female   PCP: Sherol Dixie, MD         Medical Service: Internal Medicine Teaching Service         Attending Physician: Dr. Kirt Pereyra, MD      First Contact: Dr. Jose Ngo, MD     Second Contact: Dr. Lorelle Roll, DO         After Hours (After 5p/  First Contact Pager: [phone]  weekends / holidays): Second Contact Pager: [phone]   SUBJECTIVE   Chief Complaint: Fall on thinners   History of Present Illness:  This is a 78 year old female with a past medical history of end-stage renal disease, hypertension, and type 2 diabetes mellitus who presents after having a fall at home.  She is unable to provide much history given her advanced dementia, but we spoke to her son on the phone who helped clarify the history.  Her son states that she went to the bathroom earlier and fell asleep while on the commode.  He does not think she lost consciousness.  She wedged herself in between the toilet and the wall. She does not remember this event. He says that she has not had any other major changes from baseline, but came to the ED to be evaluated for her fall. He says she has not been complaining of any urinary symptoms. She does complain of being cold often.   She thinks she is at home in bed and it is the year 1969. She is oriented to self. She says she has some suprapubic tenderness and feels more tired than usual, but other than that has no concerns.   ED Course: Patient presented as a level 2 trauma to the emergency department.  Initial vital signs showed patient having temperature of 98.7 F, pulse 84, blood pressure 141/96, satting at 100% on room air.  Imaging was widely unremarkable except for large hematoma to frontal scalp.  Labs showed evidence of worsening renal function and significant hypocalcemia.  With declining function and worsening kidney  function, IMTS was consulted for admission.  In the emergency department patient was given 1 g of calcium  gluconate.  Past Medical History: Past Medical History:  Diagnosis Date   Chronic kidney disease    Diabetes mellitus    Diet-controlled. Not on antiglycemic medications.   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  Based on the size, patient's age, status as a former smoker, and its discovery in a community setting, her probability of malignancy is 3% (low probability).  Based on this, I recommend follow-up with a CT scan around 05/2014, and if lesion is stable or smaller, no further follow-up is needed.  CT Chest 06/2015: Stable 3mm pulmonary nodule. No   Ventral hernia    Incisional ventral hernia. S/P repair by Dr. 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   Villous adenoma of colon    Hx of. S/P resection and partial colectomy with  anastomosis by Dr. Mammie Sears. (08/2006)     Meds:  Tylenol  325 mg every 4 hours as needed Amlodipine  10 mg daily Eliquis  5 mg twice daily Calcitriol  0.25 mcg 3 times daily Carvedilol  6.25 mg twice daily Docusate 100 mg daily Lasix  80 mg 3 times weekly as needed  (MWF) Potassium 10 mEq daily Crestor  20 mg daily  Past Surgical History: Past Surgical History:  Procedure Laterality Date   APPENDECTOMY     AV FISTULA PLACEMENT Left 03/10/2022   Procedure: LEFT ARM ARTERIOVENOUS (AV) FISTULA CREATION VERSUS GRAFT;  Surgeon: Adine Hoof, MD;  Location: [facility] OR;  Service: Vascular;  Laterality: Left;   HEMORRHOID SURGERY     hemorrhoidectomy   RIGHT COLECTOMY Right 08/20/2006   Partial right colectomy and anastomosis  with resection of villous adenoma.   TONSILLECTOMY     TUBAL LIGATION     VENTRAL HERNIA REPAIR  08/20/2008   By Dr. Mammie Sears.   Social:  Lives With: son Support: son and daughter in Social worker, who is a Engineer, civil (consulting) Level of Function: dependent in most ADLs and all iADLs PCP: Dr. Barkley Boot  Substances: No alcohol, tobacco, or other drug use.   Family History:  Family History  Problem Relation Age of Onset   Diabetes Other        1st degree reative in 3 of her siblings   Breast cancer Mother        not sure of age   Allergies: Allergies as of 11/16/2023 - Review Complete 11/16/2023  Allergen Reaction Noted   Metformin  and related Other (See Comments) 06/13/2019   Pineapple Itching and Swelling 09/04/2010   Zestril  [lisinopril ] Swelling 08/31/2016    Review of Systems: A complete ROS was negative except as per HPI.   OBJECTIVE:   Physical Exam: Blood pressure (!) 153/75, pulse 90, temperature 98.2 F (36.8 C), temperature source Oral, resp. rate 15, last menstrual period 10/22/1978, SpO2 99%.  Constitutional: well-appearing older woman, laying in bed, in no distress, appears comfortable HENT: normocephalic atraumatic, mucous membranes moist Cardiovascular: regular rate and rhythm, 2+ bilateral lower extremity edema, perfusing extremities well Pulmonary/Chest: normal work of breathing on room air, lungs clear to auscultation bilaterally Abdominal: soft, non-distended, suprapubic tenderness to palpation Neurological: alert & oriented x 1, baseline in the setting of dementia; not oriented to place (my bedroom) or time (1969). No new focal neurological deficits.  Skin: warm and dry Psych: normal mood, pleasant affect  Labs: CBC    Component Value Date/Time   WBC 7.7 11/16/2023 1439   RBC 3.89 11/16/2023 1439   HGB 10.5 (L) 11/16/2023 1507   HGB 13.0 11/27/2015 1400   HCT 31.0 (L) 11/16/2023 1507   HCT 40.4 11/27/2015 1400   PLT 184 11/16/2023 1439   PLT 254 11/27/2015 1400    MCV 87.9 11/16/2023 1439   MCV 85 11/27/2015 1400   MCH 26.2 11/16/2023 1439   MCHC 29.8 (L) 11/16/2023 1439   RDW 17.5 (H) 11/16/2023 1439   RDW 14.9 11/27/2015 1400   LYMPHSABS 0.7 07/15/2022 2142   MONOABS 0.9 07/15/2022 2142   EOSABS 0.1 07/15/2022 2142   BASOSABS 0.0 07/15/2022 2142     CMP     Component Value Date/Time   NA 141 11/16/2023 1618   NA 146 (H) 04/09/2022 0945   K 4.3 11/16/2023 1618   CL 108 11/16/2023 1618   CO2 14 (L) 11/16/2023 1618   GLUCOSE 104 (H) 11/16/2023 1618   BUN 81 (H) 11/16/2023 1618  BUN 58 (H) 04/09/2022 0945   CREATININE 10.89 (H) 11/16/2023 1618   CREATININE 1.09 11/20/2014 1636   CALCIUM  6.0 (LL) 11/16/2023 1618   CALCIUM  8.0 (L) 04/23/2023 1257   PROT 6.2 (L) 11/16/2023 1618   PROT 7.2 08/19/2020 1021   ALBUMIN 2.9 (L) 11/16/2023 1618   ALBUMIN 4.1 08/19/2020 1021   AST 14 (L) 11/16/2023 1618   ALT 13 11/16/2023 1618   ALKPHOS 44 11/16/2023 1618   BILITOT 0.9 11/16/2023 1618   BILITOT 0.3 08/19/2020 1021   GFRNONAA 3 (L) 11/16/2023 1618   GFRNONAA 52 (L) 11/20/2014 1636   GFRAA 32 (L) 08/19/2020 1021   GFRAA 59 (L) 11/20/2014 1636    Imaging:  Chest x-ray: No active disease  Pelvic x-ray: Negative for any acute fractures  CT head: Large left frontal scalp hematoma with no underlying fracture.  No acute intracranial process.  Stable chronic small vessel ischemic changes.  CT maxillofacial: No acute facial bone fracture  CT cervical spine: No acute cervical spine fracture.  Extensive multilevel cervical degenerative changes.  EKG: personally reviewed my interpretation is normal sinus rhythm.  No acute ST segment changes.  Normal axis.  When compared to previous EKG, QTc slightly longer on present EKG.  ASSESSMENT & PLAN:  Assessment & Plan by Problem: Principal Problem:   Hypocalcemia  ROBYNNE SPOHN is a 78 y.o. female with a past medical history of ESRD, hypertension, type 2 diabetes mellitus who presents after  having a fall at home.  Patient found to have worsening renal function, hypocalcemia, and deconditioning and is admitted for further evaluation and management.  ESRD Hypocalcemia Patient has a past medical history of CKD stage V which likely has now progressed to ESRD.  Her renal disease seems to be progressing and she now has hypocalcemia. Corrected calcium  is 6.9 on presentation. Received 1g in the ED. Patient has elected to forego hemodialysis, per son. We spoke with him on the phone about her condition and that if her renal function does not recover, we may recommend engaging palliative care.  - Repleting calcium  - Will recheck RFP overnight  Deconditioning Fell in her bathroom, on Eliquis  for her Afib. Trauma workup was negative. Unclear mechanism of fall, but likely due to poor mobility in the setting of advanced dementia and postural instability.  - PT/OT   Anion gap metabolic acidosis Likely secondary to ESRD. Will check a lactic. Gave sodium bicarb 650 mg once. Rechecking BMP overnight.    Normocytic anemia, likely secondary to chronic disease Hemoglobin 10.5, stable around baseline. Normocytic. Likely anemia of chronic disease in the setting of kidney disease.    Chronic conditions: Hypertension Blood pressure elevated at 164/82 on presentation.  Asymptomatic from a hypertension standpoint.  ESRD likely contributory.  On amlodipine  10 mg, carvedilol  6.25 mg twice daily. Continued these medications.   Paroxysmal atrial fibrillation Sinus rhythm on presentation.  Takes Eliquis  5 mg twice daily at home for anticoagulation.  Hemodynamically stable.  - Eliquis  dose reduced to 2.5 mg BID given age and creatinine  Mixed vascular and neurodegenerative dementia Baseline poor memory, disoriented, cannot manage her ADLs alone. She has been living with her son/primary caregiver. No acute changes.   Type 2 diabetes mellitus Well-controlled. Hemoglobin A1c was 5.4 one year ago. Not on any  antiglycemic agents. On crestor  20 mg daily, continued.   Diet: Renal VTE: Eliquis  IVF: None,None Code: DNR  Prior to Admission Living Arrangement: Home, with son Anticipated Discharge Location: Home vs SNF Barriers  to Discharge: Clinical improvement and PT/OT recs   Dispo: Admit patient to Observation with expected length of stay less than 2 midnights.  Signed: Dorthy Gavia, MD Internal Medicine Resident PGY-1 11/16/2023, 10:12 PM   Please page on call intern or resident: First contact: [phone] If no answer in 15 minutes, please contact senior pager at [phone]

## 2023-11-16 NOTE — ED Notes (Signed)
 Patient transported to CT

## 2023-11-16 NOTE — ED Notes (Signed)
CMP recollected and sent to lab.  

## 2023-11-16 NOTE — ED Provider Notes (Signed)
 [place] EMERGENCY DEPARTMENT AT [facility] Provider Note   CSN: [id number] Arrival date & time: 11/16/23  1434     History  No chief complaint on file.   Gina Bailey is a 78 y.o. female.  HPI Presents as a level 2 trauma.  She has dementia, level 5 caveat. Per EMS the patient was found in her bathroom, wedged into the wall after the son who is in the adjacent room heard a loud noise, fall. Patient was previously in her usual state of health.  She is noted to have ambulatory, verbal status, and was in her usual state of affairs prior to the audible fall.  Patient self cannot describe anything. EMS reports no hemodynamic instability.  She has a large left forehead hematoma, otherwise no overt physical exam abnormalities per EMS.    Home Medications Prior to Admission medications   Medication Sig Start Date End Date Taking? Authorizing Provider  acetaminophen  (TYLENOL ) 325 MG tablet Take 1-2 tablets (325-650 mg total) by mouth every 4 (four) hours as needed for mild pain. 05/01/22   Setzer, Sandra J, PA-C  amLODipine  (NORVASC ) 10 MG tablet Take 1 tablet (10 mg total) by mouth daily. 09/14/23   Marlyse Single T, PA-C  apixaban  (ELIQUIS ) 5 MG TABS tablet TAKE 1 TABLET(5 MG) BY MOUTH TWICE DAILY 01/20/23   Sherol Dixie, MD  calcitRIOL  (ROCALTROL ) 0.25 MCG capsule Take 1 capsule (0.25 mcg total) by mouth 3 (three) times a week. 07/26/23   Sherol Dixie, MD  carvedilol  (COREG ) 6.25 MG tablet Take 1 tablet (6.25 mg total) by mouth 2 (two) times daily with a meal. 02/16/23   Sherol Dixie, MD  docusate sodium  (COLACE) 100 MG capsule Take 100 mg by mouth daily.    [provider]  furosemide  (LASIX ) 80 MG tablet Take 1 tablet (80 mg total) by mouth 3 (three) times a week. M/W/F as needed for swellling per Dr. Yvonnie Heritage 05/22/22   Sherol Dixie, MD  potassium chloride  (MICRO-K ) 10 MEQ CR capsule Take 10 mEq by mouth daily. 06/04/22   [provider]  rosuvastatin  (CRESTOR ) 20 MG tablet Take 1 tablet (20 mg total) by mouth daily. 05/31/23   Sherol Dixie, MD      Allergies    Metformin  and related, Pineapple, and Zestril  [lisinopril ]    Review of Systems   Review of Systems  Physical Exam Updated Vital Signs LMP 10/22/1978  Physical Exam Vitals and nursing note reviewed.  Constitutional:      General: She is not in acute distress.    Appearance: She is well-developed.  HENT:     Head: Normocephalic.   Eyes:     Conjunctiva/sclera: Conjunctivae normal.  Neck:   Cardiovascular:     Rate and Rhythm: Normal rate and regular rhythm.  Pulmonary:     Effort: Pulmonary effort is normal. No respiratory distress.     Breath sounds: Normal breath sounds. No stridor.  Abdominal:     General: There is no distension.  Skin:    General: Skin is warm and dry.  Neurological:     Mental Status: She is alert.     Cranial Nerves: No cranial nerve deficit.     Comments: Moves all extremities to command, does not follow commands reliably, does answer some brief questions seemingly appropriately, including stating that she hurts all over.  Psychiatric:        Mood and Affect: Mood normal.  Cognition and Memory: Cognition is impaired. Memory is impaired.     ED Results / Procedures / Treatments   Labs (all labs ordered are listed, but only abnormal results are displayed) Labs Reviewed - No data to display  EKG None  Radiology No results found.  Procedures Procedures    Medications Ordered in ED Medications - No data to display  ED Course/ Medical Decision Making/ A&P                                 Medical Decision Making Elderly female with dementia on Eliquis  presents after multiple fall with head trauma.  Patient is awake and alert, concern for fracture, bleed.  Labs x-ray CT all ordered  Amount and/or Complexity of Data Reviewed Independent Historian: EMS External Data Reviewed:  notes. Labs: ordered. Decision-making details documented in ED Course. Radiology: ordered and independent interpretation performed. Decision-making details documented in ED Course. ECG/medicine tests: ordered and independent interpretation performed. Decision-making details documented in ED Course.  Risk Decision regarding hospitalization. Diagnosis or treatment significantly limited by social determinants of health.   Repeat exam patient accompanied by family members.  History discussed at length, patient is not going to start dialysis, they note worsening renal function, we discussed this in the context of age, other comorbidities.  With generally reassuring labs thus far patient awaiting radiology results, ambulatory trial, if these are both unremarkable patient will return home with family members.        Final Clinical Impression(s) / ED Diagnoses Final diagnoses:  Fall, initial encounter    Rx / DC Orders ED Discharge Orders     None         Dorenda Gandy, MD 11/16/23 1606

## 2023-11-16 NOTE — ED Provider Notes (Signed)
  Physical Exam  BP (!) 153/75   Pulse 90   Temp 98.2 F (36.8 C) (Oral)   Resp 15   LMP 10/22/1978   SpO2 99%   Physical Exam Vitals and nursing note reviewed.  HENT:     Head: Normocephalic and atraumatic.  Eyes:     Pupils: Pupils are equal, round, and reactive to light.  Cardiovascular:     Rate and Rhythm: Normal rate and regular rhythm.  Pulmonary:     Effort: Pulmonary effort is normal.     Breath sounds: Normal breath sounds.  Abdominal:     Palpations: Abdomen is soft.     Tenderness: There is no abdominal tenderness.  Skin:    General: Skin is warm and dry.  Neurological:     Mental Status: She is alert.  Psychiatric:        Mood and Affect: Mood normal.     Procedures  Procedures  ED Course / MDM   Clinical Course as of 11/16/23 2028  Tue Nov 16, 2023  2027 Laboratory workup notable for critical hypocalcemia.  No acute traumatic findings.  Patient remains significantly weak with unsteady gait even with walker.  Given acute drop in her calcium  most likely in the setting of advanced renal failure not on dialysis, will admit to medicine.  Discussed admitting hospitalist accepts patient for admission [MP]    Clinical Course User Index [MP] Sallyanne Creamer, DO   Medical Decision Making I, Rafael Bun DO, have assumed care of this patient from the previous provider pending CT results, ambulatory trial, reevaluation and disposition  Amount and/or Complexity of Data Reviewed Labs: ordered. Radiology: ordered.  Risk Prescription drug management. Decision regarding hospitalization.          Sallyanne Creamer, DO 11/16/23 2028

## 2023-11-17 ENCOUNTER — Encounter (HOSPITAL_COMMUNITY): Payer: Self-pay | Admitting: Internal Medicine

## 2023-11-17 ENCOUNTER — Other Ambulatory Visit: Payer: Self-pay

## 2023-11-17 DIAGNOSIS — S0083XA Contusion of other part of head, initial encounter: Secondary | ICD-10-CM | POA: Diagnosis present

## 2023-11-17 DIAGNOSIS — Z860101 Personal history of adenomatous and serrated colon polyps: Secondary | ICD-10-CM | POA: Diagnosis not present

## 2023-11-17 DIAGNOSIS — E785 Hyperlipidemia, unspecified: Secondary | ICD-10-CM | POA: Diagnosis present

## 2023-11-17 DIAGNOSIS — Z79899 Other long term (current) drug therapy: Secondary | ICD-10-CM | POA: Diagnosis not present

## 2023-11-17 DIAGNOSIS — E872 Acidosis, unspecified: Secondary | ICD-10-CM | POA: Diagnosis present

## 2023-11-17 DIAGNOSIS — W19XXXA Unspecified fall, initial encounter: Secondary | ICD-10-CM | POA: Diagnosis present

## 2023-11-17 DIAGNOSIS — Y92002 Bathroom of unspecified non-institutional (private) residence single-family (private) house as the place of occurrence of the external cause: Secondary | ICD-10-CM | POA: Diagnosis not present

## 2023-11-17 DIAGNOSIS — I120 Hypertensive chronic kidney disease with stage 5 chronic kidney disease or end stage renal disease: Secondary | ICD-10-CM | POA: Diagnosis present

## 2023-11-17 DIAGNOSIS — I480 Paroxysmal atrial fibrillation: Secondary | ICD-10-CM | POA: Diagnosis present

## 2023-11-17 DIAGNOSIS — Z833 Family history of diabetes mellitus: Secondary | ICD-10-CM | POA: Diagnosis not present

## 2023-11-17 DIAGNOSIS — D631 Anemia in chronic kidney disease: Secondary | ICD-10-CM | POA: Diagnosis present

## 2023-11-17 DIAGNOSIS — F0390 Unspecified dementia without behavioral disturbance: Secondary | ICD-10-CM | POA: Diagnosis present

## 2023-11-17 DIAGNOSIS — Z66 Do not resuscitate: Secondary | ICD-10-CM | POA: Diagnosis present

## 2023-11-17 DIAGNOSIS — Z803 Family history of malignant neoplasm of breast: Secondary | ICD-10-CM | POA: Diagnosis not present

## 2023-11-17 DIAGNOSIS — D638 Anemia in other chronic diseases classified elsewhere: Secondary | ICD-10-CM | POA: Diagnosis not present

## 2023-11-17 DIAGNOSIS — Z7901 Long term (current) use of anticoagulants: Secondary | ICD-10-CM | POA: Diagnosis not present

## 2023-11-17 DIAGNOSIS — M1030 Gout due to renal impairment, unspecified site: Secondary | ICD-10-CM | POA: Diagnosis present

## 2023-11-17 DIAGNOSIS — Z91018 Allergy to other foods: Secondary | ICD-10-CM | POA: Diagnosis not present

## 2023-11-17 DIAGNOSIS — Z992 Dependence on renal dialysis: Secondary | ICD-10-CM | POA: Diagnosis not present

## 2023-11-17 DIAGNOSIS — R296 Repeated falls: Secondary | ICD-10-CM | POA: Diagnosis present

## 2023-11-17 DIAGNOSIS — R011 Cardiac murmur, unspecified: Secondary | ICD-10-CM | POA: Diagnosis present

## 2023-11-17 DIAGNOSIS — N186 End stage renal disease: Secondary | ICD-10-CM | POA: Diagnosis present

## 2023-11-17 DIAGNOSIS — Z888 Allergy status to other drugs, medicaments and biological substances status: Secondary | ICD-10-CM | POA: Diagnosis not present

## 2023-11-17 DIAGNOSIS — E1122 Type 2 diabetes mellitus with diabetic chronic kidney disease: Secondary | ICD-10-CM | POA: Diagnosis present

## 2023-11-17 DIAGNOSIS — N390 Urinary tract infection, site not specified: Secondary | ICD-10-CM | POA: Diagnosis present

## 2023-11-17 LAB — RENAL FUNCTION PANEL
Albumin: 2.8 g/dL — ABNORMAL LOW (ref 3.5–5.0)
Albumin: 2.8 g/dL — ABNORMAL LOW (ref 3.5–5.0)
Anion gap: 20 — ABNORMAL HIGH (ref 5–15)
Anion gap: 19 — ABNORMAL HIGH (ref 5–15)
BUN: 81 mg/dL — ABNORMAL HIGH (ref 8–23)
BUN: 81 mg/dL — ABNORMAL HIGH (ref 8–23)
CO2: 14 mmol/L — ABNORMAL LOW (ref 22–32)
CO2: 15 mmol/L — ABNORMAL LOW (ref 22–32)
Calcium: 6.6 mg/dL — ABNORMAL LOW (ref 8.9–10.3)
Calcium: 6.7 mg/dL — ABNORMAL LOW (ref 8.9–10.3)
Chloride: 109 mmol/L (ref 98–111)
Chloride: 109 mmol/L (ref 98–111)
Creatinine, Ser: 10.89 mg/dL — ABNORMAL HIGH (ref 0.44–1.00)
Creatinine, Ser: 10.74 mg/dL — ABNORMAL HIGH (ref 0.44–1.00)
GFR, Estimated: 3 mL/min — ABNORMAL LOW (ref >60)
GFR, Estimated: 3 mL/min — ABNORMAL LOW (ref >60)
Glucose, Bld: 74 mg/dL (ref 70–99)
Glucose, Bld: 81 mg/dL (ref 70–99)
Phosphorus: >30 mg/dL — ABNORMAL HIGH (ref 2.5–4.6)
Phosphorus: >30 mg/dL — ABNORMAL HIGH (ref 2.5–4.6)
Potassium: 4.5 mmol/L (ref 3.5–5.1)
Potassium: 4.3 mmol/L (ref 3.5–5.1)
Sodium: 143 mmol/L (ref 135–145)
Sodium: 143 mmol/L (ref 135–145)

## 2023-11-17 LAB — BLOOD GAS, VENOUS
Acid-base deficit: 10.3 mmol/L — ABNORMAL HIGH (ref 0.0–2.0)
Bicarbonate: 15.6 mmol/L — ABNORMAL LOW (ref 20.0–28.0)
O2 Saturation: 70.2 %
Patient temperature: 36.9
pCO2, Ven: 34 mmHg — ABNORMAL LOW (ref 44–60)
pH, Ven: 7.27 (ref 7.25–7.43)
pO2, Ven: 46 mmHg — ABNORMAL HIGH (ref 32–45)

## 2023-11-17 LAB — CBC
HCT: 29.6 % — ABNORMAL LOW (ref 36.0–46.0)
Hemoglobin: 9.1 g/dL — ABNORMAL LOW (ref 12.0–15.0)
MCH: 26.9 pg (ref 26.0–34.0)
MCHC: 30.7 g/dL (ref 30.0–36.0)
MCV: 87.6 fL (ref 80.0–100.0)
Platelets: 149 10*3/uL — ABNORMAL LOW (ref 150–400)
RBC: 3.38 MIL/uL — ABNORMAL LOW (ref 3.87–5.11)
RDW: 17.4 % — ABNORMAL HIGH (ref 11.5–15.5)
WBC: 7.9 10*3/uL (ref 4.0–10.5)
nRBC: 0 % (ref 0.0–0.2)

## 2023-11-17 LAB — CK: Total CK: 373 U/L — ABNORMAL HIGH (ref 38–234)

## 2023-11-17 LAB — MAGNESIUM: Magnesium: 2.3 mg/dL (ref 1.7–2.4)

## 2023-11-17 MED ORDER — CALCIUM CARBONATE ANTACID 500 MG PO CHEW
1.0000 | CHEWABLE_TABLET | Freq: Two times a day (BID) | ORAL | Status: DC
  Administered 2023-11-17 – 2023-11-19 (×5): 200 mg via ORAL
  Filled 2023-11-17 (×5): qty 1

## 2023-11-17 MED ORDER — CALCIUM GLUCONATE-NACL 1-0.675 GM/50ML-% IV SOLN
1.0000 g | Freq: Once | INTRAVENOUS | Status: AC
  Administered 2023-11-17: 1000 mg via INTRAVENOUS
  Filled 2023-11-17: qty 50

## 2023-11-17 MED ORDER — CALCITRIOL 0.25 MCG PO CAPS
0.2500 ug | ORAL_CAPSULE | Freq: Every day | ORAL | Status: DC
  Administered 2023-11-17 – 2023-11-19 (×3): 0.25 ug via ORAL
  Filled 2023-11-17 (×3): qty 1

## 2023-11-17 MED ORDER — SODIUM BICARBONATE 650 MG PO TABS
650.0000 mg | ORAL_TABLET | Freq: Every day | ORAL | Status: DC
  Administered 2023-11-17 – 2023-11-19 (×3): 650 mg via ORAL
  Filled 2023-11-17 (×3): qty 1

## 2023-11-17 MED ORDER — SEVELAMER CARBONATE 800 MG PO TABS
800.0000 mg | ORAL_TABLET | Freq: Three times a day (TID) | ORAL | Status: DC
  Administered 2023-11-17 – 2023-11-19 (×6): 800 mg via ORAL
  Filled 2023-11-17 (×6): qty 1

## 2023-11-17 NOTE — Progress Notes (Signed)
 HD#0 SUBJECTIVE:  Patient Summary: Gina Bailey is a 78 y.o. with a pertinent PMH of ESRD not on HD, T2DM, HTN, who presented with a fall and admitted for ESRD and hypocalcemia.   Overnight Events: No acute events overnight  Interim History: The patient was seen at the bedside. She does not remember why she came to the hospital, but notes that she is hungry. No acute concerns other than wanting orange juice.   OBJECTIVE:  Vital Signs: Vitals:   11/16/23 2206 11/17/23 0038 11/17/23 0755 11/17/23 0819  BP:  (!) 154/71 (!) 159/77 (!) 170/84  Pulse:  85 83 83  Resp:  18 19 18   Temp: 99.7 F (37.6 C) 98.7 F (37.1 C) 98.7 F (37.1 C) 98.5 F (36.9 C)  TempSrc: Oral Oral Oral Oral  SpO2:  100% 97% 100%   Supplemental O2: Room Air SpO2: 100 %  There were no vitals filed for this visit.   Intake/Output Summary (Last 24 hours) at 11/17/2023 1019 Last data filed at 11/16/2023 1440 Gross per 24 hour  Intake 0 ml  Output 0 ml  Net 0 ml   Net IO Since Admission: 0 mL [11/17/23 1019]  Physical Exam: General: Pleasant, chronically ill appearing elderly female laying in bed, NAD CV: RRR. Systolic murmur appreciated Pulmonary: Normal effort on room air Skin: Warm and dry. No obvious rash or lesions. Neuro: Alert and oriented to self and place, not oriented to time or situation.  Psych: Normal mood and affect    ASSESSMENT/PLAN:  Assessment: Principal Problem:   Hypocalcemia   Plan: ESRD not on HD Hypocalcemia Hyperphosphatemia AGMA Patient has had CKD V dating back to at least October 2023 and ultimately decided not to pursue hemodialysis. It appears that her renal disease has progressed, most recently with a Cr of 10.89, GFR of 3. Calcium  is low at 6.6 and phos is high at >30, although this could be a lab error. QT is slightly prolonged at 492 ms, likely in the setting of hypocalcemia - s/p IV calcium  gluconate this morning - renvela 800 mg TID - TUMS 200 mg BID -  sodium bicarb 650 mg daily - calcitriol  0.25 mcg daily  - Renal diet (low phos diet)  Fall on thinners Fell in her bathroom, but trauma workup was negative. PT/OT consulted.   Goals of care Vascular and neurodegenerative dementia As noted above, the patient has opted to not pursue hemodialysis. Given her dementia, will discuss goals of care with her son today. Hospice is certainly reasonable given her advanced renal disease.   Paroxysmal Afib HTN In sinus rhythm now. Eliquis  was reduced from 5 mg BID to 2.5 mg BID given elevated HAS-BLED score and also since she is almost 78 yrs old with advanced kidney disease - Eliquis  2.5 mg bid - Coreg  6.25 mg bid - Amlodipine  10 mg daily   Normocytic anemia Likely anemia of chronic disease. Hb is 9.1 today from 10.2 yesterday, but no obvious source of bleeding seen.   Best Practice: Diet: Renal diet IVF: Fluids: none VTE: apixaban  (ELIQUIS ) tablet 2.5 mg Start: 11/16/23 2200 Code: DNR/DNI AB: None Therapy Recs: Pending Family Contact: Son, [name], to be notified. DISPO: Anticipated discharge to Home pending  medical stability and goals of care conversation .  Signature: Lillyonna Armstead, D.O.  Internal Medicine Resident, PGY-3 [facility]  Pager: [phone] 10:19 AM, 11/17/2023   Please contact the on call pager after 5 pm and on weekends at [phone].

## 2023-11-17 NOTE — Care Management Obs Status (Signed)
 MEDICARE OBSERVATION STATUS NOTIFICATION   Patient Details  Name: Gina Bailey MRN: [id number] Date of Birth: 1946/03/09   Medicare Observation Status Notification Given:  Yes  Moon/Obs letter Mailed certified to the Patients daughter [name] 11/17/2023, 4:09 PM

## 2023-11-17 NOTE — TOC CAGE-AID Note (Signed)
 Transition of Care [facility]) - CAGE-AID Screening   Patient Details  Name: Gina Bailey MRN: [id number] Date of Birth: 1945-08-06  [facility] of [facility]) CM/SW Contact:    Ezelle Surprenant E Sarie Stall, LCSW Phone Number: 11/17/2023, 11:12 AM   Clinical Narrative: Per chart review - patient has advanced Dementia.   CAGE-AID Screening: Substance Abuse Screening unable to be completed due to: : Patient unable to participate (patient has dementia)

## 2023-11-18 ENCOUNTER — Encounter (HOSPITAL_COMMUNITY): Payer: Self-pay

## 2023-11-18 ENCOUNTER — Other Ambulatory Visit (HOSPITAL_COMMUNITY): Payer: Self-pay

## 2023-11-18 DIAGNOSIS — N186 End stage renal disease: Secondary | ICD-10-CM | POA: Diagnosis not present

## 2023-11-18 DIAGNOSIS — D638 Anemia in other chronic diseases classified elsewhere: Secondary | ICD-10-CM | POA: Diagnosis not present

## 2023-11-18 DIAGNOSIS — Z992 Dependence on renal dialysis: Secondary | ICD-10-CM | POA: Diagnosis not present

## 2023-11-18 LAB — RENAL FUNCTION PANEL
Albumin: 2.5 g/dL — ABNORMAL LOW (ref 3.5–5.0)
Anion gap: 17 — ABNORMAL HIGH (ref 5–15)
BUN: 83 mg/dL — ABNORMAL HIGH (ref 8–23)
CO2: 15 mmol/L — ABNORMAL LOW (ref 22–32)
Calcium: 7 mg/dL — ABNORMAL LOW (ref 8.9–10.3)
Chloride: 110 mmol/L (ref 98–111)
Creatinine, Ser: 10.8 mg/dL — ABNORMAL HIGH (ref 0.44–1.00)
GFR, Estimated: 3 mL/min — ABNORMAL LOW (ref >60)
Glucose, Bld: 86 mg/dL (ref 70–99)
Phosphorus: 11.6 mg/dL — ABNORMAL HIGH (ref 2.5–4.6)
Potassium: 4.1 mmol/L (ref 3.5–5.1)
Sodium: 142 mmol/L (ref 135–145)

## 2023-11-18 LAB — CBC
HCT: 27.3 % — ABNORMAL LOW (ref 36.0–46.0)
Hemoglobin: 8.5 g/dL — ABNORMAL LOW (ref 12.0–15.0)
MCH: 26.9 pg (ref 26.0–34.0)
MCHC: 31.1 g/dL (ref 30.0–36.0)
MCV: 86.4 fL (ref 80.0–100.0)
Platelets: 146 10*3/uL — ABNORMAL LOW (ref 150–400)
RBC: 3.16 MIL/uL — ABNORMAL LOW (ref 3.87–5.11)
RDW: 17.2 % — ABNORMAL HIGH (ref 11.5–15.5)
WBC: 6.7 10*3/uL (ref 4.0–10.5)
nRBC: 0 % (ref 0.0–0.2)

## 2023-11-18 LAB — URINE CULTURE

## 2023-11-18 MED ORDER — APIXABAN 2.5 MG PO TABS
2.5000 mg | ORAL_TABLET | Freq: Two times a day (BID) | ORAL | 0 refills | Status: AC
  Filled 2023-11-18: qty 60, 30d supply, fill #0

## 2023-11-18 MED ORDER — CEFUROXIME AXETIL 250 MG PO TABS
250.0000 mg | ORAL_TABLET | Freq: Every day | ORAL | 0 refills | Status: AC
  Filled 2023-11-18: qty 4, 4d supply, fill #0

## 2023-11-18 MED ORDER — CALCIUM ACETATE (PHOS BINDER) 667 MG PO CAPS
667.0000 mg | ORAL_CAPSULE | Freq: Three times a day (TID) | ORAL | 0 refills | Status: AC
  Filled 2023-11-18: qty 90, 30d supply, fill #0

## 2023-11-18 MED ORDER — SODIUM CHLORIDE 0.9 % IV SOLN: 1.0000 g | INTRAVENOUS | Status: DC

## 2023-11-18 MED ORDER — CEFPODOXIME PROXETIL 100 MG PO TABS
100.0000 mg | ORAL_TABLET | Freq: Two times a day (BID) | ORAL | 0 refills | Status: DC
  Filled 2023-11-18: qty 8, 4d supply, fill #0

## 2023-11-18 MED ORDER — CALCITRIOL 0.25 MCG PO CAPS
0.2500 ug | ORAL_CAPSULE | Freq: Every day | ORAL | 0 refills | Status: AC
  Filled 2023-11-18: qty 30, 30d supply, fill #0

## 2023-11-18 MED ORDER — CALCIUM GLUCONATE-NACL 1-0.675 GM/50ML-% IV SOLN
1.0000 g | Freq: Once | INTRAVENOUS | Status: AC
  Administered 2023-11-18: 1000 mg via INTRAVENOUS
  Filled 2023-11-18: qty 50

## 2023-11-18 MED ORDER — SODIUM CHLORIDE 0.9 % IV SOLN
1.0000 g | INTRAVENOUS | Status: DC
  Administered 2023-11-18: 1 g via INTRAVENOUS
  Filled 2023-11-18: qty 10

## 2023-11-18 NOTE — Evaluation (Signed)
 Physical Therapy Evaluation Patient Details Name: Gina Bailey MRN: [id number] DOB: 1945-11-06 Today's Date: 11/18/2023  History of Present Illness  Pt is a 78 y.o. female presenting 4/29 after fall at home. Found to have worsening renal function, hypocalcemia. PMH significant for ESRD, HTN, DMII, dementia  Clinical Impression  Pt admitted with above diagnosis. Pt from home with family where she has good family support but has had multiple falls, esp when she forgets to use her RW. Pt able to come to EOB with increased time but no physical assist. Ambulated 39' with RW and min A. Balance deficits apparent with turns and obstacle navigation as well as slow gait speed. Recommend HHPT upon d/c, pt and family agreeable to this.  Pt currently with functional limitations due to the deficits listed below (see PT Problem List). Pt will benefit from acute skilled PT to increase their independence and safety with mobility to allow discharge.           If plan is discharge home, recommend the following: A little help with walking and/or transfers;A little help with bathing/dressing/bathroom;Assistance with cooking/housework;Supervision due to cognitive status;Assist for transportation   Can travel by private vehicle        Equipment Recommendations None recommended by PT  Recommendations for Other Services       Functional Status Assessment Patient has had a recent decline in their functional status and demonstrates the ability to make significant improvements in function in a reasonable and predictable amount of time.     Precautions / Restrictions Precautions Precautions: Fall Recall of Precautions/Restrictions: Impaired Precaution/Restrictions Comments: has had mutliple falls at home, often forgets to use RW Restrictions Weight Bearing Restrictions Per Provider Order: No      Mobility  Bed Mobility Overal bed mobility: Needs Assistance Bed Mobility: Supine to Sit, Sit to Supine      Supine to sit: Contact guard Sit to supine: Contact guard assist   General bed mobility comments: pt needed increased time to come to EOB and took great effort to get LE's back up into bed to return to supine but no physical assist needed. Did use rail    Transfers Overall transfer level: Needs assistance Equipment used: Rolling walker (2 wheels) Transfers: Sit to/from Stand, Bed to chair/wheelchair/BSC Sit to Stand: Contact guard assist           General transfer comment: CGA for safety    Ambulation/Gait Ambulation/Gait assistance: Min assist Gait Distance (Feet): 80 Feet Assistive device: Rolling walker (2 wheels) Gait Pattern/deviations: Step-through pattern, Drifts right/left Gait velocity: decreased Gait velocity interpretation: <1.8 ft/sec, indicate of risk for recurrent falls   General Gait Details: CGA when pt ambulating straight, no obstacles, min A with turning and when veering around obstacles.  Stairs            Wheelchair Mobility     Tilt Bed    Modified Rankin (Stroke Patients Only)       Balance Overall balance assessment: Needs assistance, History of Falls Sitting-balance support: No upper extremity supported, Feet supported Sitting balance-Leahy Scale: Fair     Standing balance support: Bilateral upper extremity supported, During functional activity Standing balance-Leahy Scale: Poor Standing balance comment: unsteady in standing without UE support, can maintain statically but unable to accept challenges of any sort                             Pertinent Vitals/Pain Pain Assessment Pain Assessment:  No/denies pain    Home Living Family/patient expects to be discharged to:: Private residence Living Arrangements: Children;Other relatives Available Help at Discharge: Family;Available 24 hours/day Type of Home: House Home Access: Level entry       Home Layout: One level Home Equipment: Agricultural consultant (2 wheels);Shower  seat Additional Comments: Pt lives at home with son who works during the day. Pt has extensive family who can assist nearly 24/7 if needed.    Prior Function Prior Level of Function : Needs assist             Mobility Comments: ambulates with RW ADLs Comments: needs help with IADLs, min A for dressing at times     Extremity/Trunk Assessment   Upper Extremity Assessment Upper Extremity Assessment: Defer to OT evaluation    Lower Extremity Assessment Lower Extremity Assessment: Generalized weakness    Cervical / Trunk Assessment Cervical / Trunk Assessment: Normal  Communication   Communication Communication: No apparent difficulties    Cognition Arousal: Alert Behavior During Therapy: WFL for tasks assessed/performed   PT - Cognitive impairments: History of cognitive impairments                       PT - Cognition Comments: pt with confusion, baseline per daughter Following commands: Intact       Cueing Cueing Techniques: Verbal cues     General Comments General comments (skin integrity, edema, etc.): VSS on RA. Daughter present    Exercises     Assessment/Plan    PT Assessment Patient needs continued PT services  PT Problem List Decreased strength;Decreased activity tolerance;Decreased balance;Decreased mobility;Decreased cognition;Decreased coordination;Decreased safety awareness       PT Treatment Interventions DME instruction;Gait training;Functional mobility training;Therapeutic activities;Therapeutic exercise;Balance training;Neuromuscular re-education;Cognitive remediation;Patient/family education    PT Goals (Current goals can be found in the Care Plan section)  Acute Rehab PT Goals Patient Stated Goal: return home PT Goal Formulation: With patient/family Time For Goal Achievement: 12/02/23 Potential to Achieve Goals: Good    Frequency Min 2X/week     Co-evaluation               AM-PAC PT "6 Clicks" Mobility  Outcome  Measure Help needed turning from your back to your side while in a flat bed without using bedrails?: None Help needed moving from lying on your back to sitting on the side of a flat bed without using bedrails?: A Little Help needed moving to and from a bed to a chair (including a wheelchair)?: A Little Help needed standing up from a chair using your arms (e.g., wheelchair or bedside chair)?: A Little Help needed to walk in hospital room?: A Little Help needed climbing 3-5 steps with a railing? : A Lot 6 Click Score: 18    End of Session Equipment Utilized During Treatment: Gait belt Activity Tolerance: Patient tolerated treatment well Patient left: in bed;with call bell/phone within reach;with bed alarm set;with family/visitor present Nurse Communication: Mobility status PT Visit Diagnosis: Unsteadiness on feet (R26.81);Repeated falls (R29.6);History of falling (Z91.81);Difficulty in walking, not elsewhere classified (R26.2);Muscle weakness (generalized) (M62.81)    Time: 1040-1105 PT Time Calculation (min) (ACUTE ONLY): 25 min   Charges:   PT Evaluation $PT Eval Moderate Complexity: 1 Mod PT Treatments $Gait Training: 8-22 mins PT General Charges $$ ACUTE PT VISIT: 1 Visit         Amey Ka, PT  Acute Rehab Services Secure chat preferred Office [phone]   Deloris Fetters Sully Manzi 11/18/2023,  2:14 PM

## 2023-11-18 NOTE — Discharge Summary (Deleted)
 Name: Gina Bailey MRN: [id number] DOB: [birth year] 78 y.o. PCP: Gina Dixie, MD  Date of Admission: 11/16/2023  2:34 PM Date of Discharge:  11/18/2023 Attending Physician: Dr.  Lelia Putnam  DISCHARGE DIAGNOSIS:  Primary Problem: Hypocalcemia   Hospital Problems: Principal Problem:   Hypocalcemia Active Problems:   Chronic kidney disease   Fall    DISCHARGE MEDICATIONS:   Allergies as of 11/18/2023       Reactions   Metformin  And Related Other (See Comments)   Could not tolerate metformin  due to GI side effects   Pineapple Itching, Swelling   Zestril  [lisinopril ] Swelling        Medication List     TAKE these medications    acetaminophen  325 MG tablet Commonly known as: TYLENOL  Take 1-2 tablets (325-650 mg total) by mouth every 4 (four) hours as needed for mild pain.   amLODipine  10 MG tablet Commonly known as: NORVASC  Take 1 tablet (10 mg total) by mouth daily.   apixaban  2.5 MG Tabs tablet Commonly known as: ELIQUIS  Take 1 tablet (2.5 mg total) by mouth 2 (two) times daily. What changed:  medication strength how much to take how to take this when to take this additional instructions   calcitRIOL  0.25 MCG capsule Commonly known as: ROCALTROL  Take 1 capsule (0.25 mcg total) by mouth daily. Start taking on: Nov 19, 2023 What changed: when to take this   calcium  acetate 667 MG capsule Commonly known as: PHOSLO  Take 1 capsule (667 mg total) by mouth 3 (three) times daily with meals.   carvedilol  6.25 MG tablet Commonly known as: COREG  Take 1 tablet (6.25 mg total) by mouth 2 (two) times daily with a meal.   cefpodoxime  100 MG tablet Commonly known as: VANTIN  Take 1 tablet (100 mg total) by mouth 2 (two) times daily for 4 days. Start taking on: Nov 19, 2023   docusate sodium  100 MG capsule Commonly known as: COLACE Take 100 mg by mouth daily.   furosemide  80 MG tablet Commonly known as: LASIX  Take 1 tablet (80 mg total) by mouth 3 (three)  times a week. M/W/F as needed for swellling per Dr. Yvonnie Heritage What changed:  when to take this additional instructions   rosuvastatin  20 MG tablet Commonly known as: CRESTOR  Take 1 tablet (20 mg total) by mouth daily.        DISPOSITION AND FOLLOW-UP:  [name] was discharged from [facility] in [place] condition. At the hospital follow up visit please address:  Follow-up Recommendations: Consults: Neprho, Hospice Labs: Kidney Profile Medications: Phoslo   Follow-up Appointments:   PCP 11/25/2023 2:45PM  HOSPITAL COURSE:  Patient Summary: Gina Bailey is a 78 y.o. female with a past medical history of ESRD, hypertension, type 2 diabetes mellitus who presents after having a fall at home.  Patient found to have worsening renal function, was kyperkalemic, hyperphosphatemic, and hypocalcemic. She received 3g of IV calcium  gluconate, TUMS, and sevelamer  during her hospitalization. Her Calcium  corrected is 8.2 at discharge. K WNL, Phosphorus decreased from >30 to 11. Being discharged with phoslo  667mg  TID with meals and Calcitriol  0.25mcg daily. She has been largely asymptomatic.   -Please consider following up with an RFP at FU    Goals of care  Discussed with given her general deconditioning and worsening kidney function, that hospice would be an appropriate option for him.  Son has been currently overwhelmed but not willing to fully let go of her care.  He is  open to having hospice at home.  TOC was consulted to help with the transition. -Please follow-up and make sure that he has been set up with hospice care.   Deconditioning Fell in her bathroom, on Eliquis  for her Afib. Trauma workup was negative. Unclear mechanism of fall, but likely due to poor mobility in the setting of advanced dementia and postural instability.  UTI had rare bacteria and leukocytosis.  She was tender to palpation in the suprapubic region at admission.  She was given 1 dose of  ceftriaxone  and is discharged on cefpodoxime  for 4 more days.  - PT/OT    Anion gap metabolic acidosis Likely secondary to ESRD. Will check a lactic. Gave sodium bicarb 650 mg once.  Please continue to follow-up outpatient and consider bicarbonate supplementation if needed.   Normocytic anemia, likely secondary to chronic disease Hemoglobin 10.5, stable around baseline. Normocytic. Likely anemia of chronic disease in the setting of kidney disease.     Chronic conditions: Hypertension Blood pressure elevated at 164/82 on presentation.  Asymptomatic from a hypertension standpoint.  ESRD likely contributory.  On amlodipine  10 mg, carvedilol  6.25 mg twice daily. Continued these medications.    Paroxysmal atrial fibrillation Sinus rhythm on presentation.  Takes Eliquis  5 mg twice daily at home for anticoagulation.  Hemodynamically stable.  - Eliquis  dose reduced to 2.5 mg BID given age and creatinine   Mixed vascular and neurodegenerative dementia Baseline poor memory, disoriented, cannot manage her ADLs alone. She has been living with her son/primary caregiver. No acute changes.    Type 2 diabetes mellitus Well-controlled. Hemoglobin A1c was 5.4 one year ago. Not on any antiglycemic agents. On crestor  20 mg daily, continued.     DISCHARGE INSTRUCTIONS:   Discharge Instructions     Call MD for:  difficulty breathing, headache or visual disturbances   Complete by: As directed    Call MD for:  extreme fatigue   Complete by: As directed    Call MD for:  hives   Complete by: As directed    Call MD for:  persistant dizziness or light-headedness   Complete by: As directed    Call MD for:  persistant nausea and vomiting   Complete by: As directed    Call MD for:  severe uncontrolled pain   Complete by: As directed    Call MD for:  temperature >100.4   Complete by: As directed    Increase activity slowly   Complete by: As directed        SUBJECTIVE:  She states she is feeling  well overall. She is not having any abdominal pain or difficulty urinating. Denies any cramping. Denies any tingling around her mouth  Discharge Vitals:   BP (!) 152/74 (BP Location: Right Arm)   Pulse 79   Temp 98.9 F (37.2 C) (Oral)   Resp 17   LMP 10/22/1978   SpO2 99%   OBJECTIVE:  General: Pleasant, chronically ill appearing elderly female laying in bed, NAD CV: RRR. Systolic murmur appreciated Pulmonary: Normal effort on room air Abdominal: No TTP, no CVA tenderness Skin: Warm and dry. No obvious rash or lesions. Neuro: Alert and oriented to self and place. CN 2-12 intact. Negative pronator drift. Psych: Normal mood and affect  Pertinent Labs, Studies, and Procedures:     Latest Ref Rng & Units 11/18/2023    5:21 AM 11/17/2023    4:00 AM 11/16/2023    3:07 PM  CBC  WBC 4.0 - 10.5 K/uL 6.7  7.9    Hemoglobin 12.0 - 15.0 g/dL 8.5  9.1  16.1   Hematocrit 36.0 - 46.0 % 27.3  29.6  31.0   Platelets 150 - 400 K/uL 146  149         Latest Ref Rng & Units 11/18/2023    5:21 AM 11/17/2023   12:21 PM 11/17/2023    4:00 AM  CMP  Glucose 70 - 99 mg/dL 86  81  74   BUN 8 - 23 mg/dL 83  81  81   Creatinine 0.44 - 1.00 mg/dL 09.60  45.40  98.11   Sodium 135 - 145 mmol/L 142  143  143   Potassium 3.5 - 5.1 mmol/L 4.1  4.3  4.5   Chloride 98 - 111 mmol/L 110  109  109   CO2 22 - 32 mmol/L 15  15  14    Calcium  8.9 - 10.3 mg/dL 7.0  6.7  6.6     CT CERVICAL SPINE WO CONTRAST Result Date: 11/16/2023 CLINICAL DATA:  Blunt trauma, fell EXAM: CT CERVICAL SPINE WITHOUT CONTRAST TECHNIQUE: Multidetector CT imaging of the cervical spine was performed without intravenous contrast. Multiplanar CT image reconstructions were also generated. RADIATION DOSE REDUCTION: This exam was performed according to the departmental dose-optimization program which includes automated exposure control, adjustment of the mA and/or kV according to patient size and/or use of iterative reconstruction technique.  COMPARISON:  None Available. FINDINGS: Alignment: Straightening of the cervical spine due to multilevel degenerative changes. Otherwise alignment is anatomic. Skull base and vertebrae: No acute fracture. No primary bone lesion or focal pathologic process. Soft tissues and spinal canal: No prevertebral fluid or swelling. No visible canal hematoma. Disc levels: There is moderate diffuse spondylosis throughout the cervical spine, with prominent anterior osteophytes noted from C4-5 through C7-T1. Diffuse facet hypertrophy, left predominant at C2-3, C3-4, and C4-5. Left predominant neural foraminal encroachment at C3-4 and C4-5. Upper chest: Airway is patent. Visualized portions of the lung apices are clear. Other: Reconstructed images demonstrate no additional findings. IMPRESSION: 1. No acute cervical spine fracture. 2. Extensive multilevel cervical degenerative changes. Electronically Signed   By: Bobbye Burrow M.D.   On: 11/16/2023 16:11   CT MAXILLOFACIAL WO CONTRAST Result Date: 11/16/2023 CLINICAL DATA:  Marvell Slider, facial trauma EXAM: CT MAXILLOFACIAL WITHOUT CONTRAST TECHNIQUE: Multidetector CT imaging of the maxillofacial structures was performed. Multiplanar CT image reconstructions were also generated. RADIATION DOSE REDUCTION: This exam was performed according to the departmental dose-optimization program which includes automated exposure control, adjustment of the mA and/or kV according to patient size and/or use of iterative reconstruction technique. COMPARISON:  None Available. FINDINGS: Osseous: No fracture or mandibular dislocation. No destructive process. Orbits: Negative. No traumatic or inflammatory finding. Sinuses: Small osteoma within the left ethmoid air cells. Remaining paranasal sinuses are clear. Soft tissues: Negative. Limited intracranial: No significant or unexpected finding. IMPRESSION: 1. No acute facial bone fracture. Electronically Signed   By: Bobbye Burrow M.D.   On: 11/16/2023 16:09    CT HEAD WO CONTRAST Result Date: 11/16/2023 CLINICAL DATA:  Marvell Slider, head trauma EXAM: CT HEAD WITHOUT CONTRAST TECHNIQUE: Contiguous axial images were obtained from the base of the skull through the vertex without intravenous contrast. RADIATION DOSE REDUCTION: This exam was performed according to the departmental dose-optimization program which includes automated exposure control, adjustment of the mA and/or kV according to patient size and/or use of iterative reconstruction technique. COMPARISON:  04/27/2022 FINDINGS: Brain: Chronic small vessel ischemic changes are seen within  the periventricular white matter, basal ganglia, and right thalamus. No evidence of acute infarct or hemorrhage. Lateral ventricles and remaining midline structures are unremarkable. No acute extra-axial fluid collections. No mass effect. Vascular: Stable atherosclerosis.  No hyperdense vessel. Skull: Large left frontal scalp hematoma. No underlying fracture. The remainder of the calvarium is unremarkable. Sinuses/Orbits: No acute finding. Other: None. IMPRESSION: 1. Large left frontal scalp hematoma.  No underlying fracture. 2. No acute intracranial process. 3. Stable chronic small vessel ischemic changes. Electronically Signed   By: Bobbye Burrow M.D.   On: 11/16/2023 16:07   DG Pelvis Portable Result Date: 11/16/2023 CLINICAL DATA:  Multiple falls. EXAM: PORTABLE PELVIS 1-2 VIEWS COMPARISON:  July 16, 2022. FINDINGS: There is no evidence of pelvic fracture or diastasis. No pelvic bone lesions are seen. IMPRESSION: Negative. Electronically Signed   By: Rosalene Colon M.D.   On: 11/16/2023 15:31   DG Chest Port 1 View Result Date: 11/16/2023 CLINICAL DATA:  Multiple falls. EXAM: PORTABLE CHEST 1 VIEW COMPARISON:  April 16, 2022. FINDINGS: Stable cardiomegaly. Both lungs are clear. The visualized skeletal structures are unremarkable. IMPRESSION: No active disease. Electronically Signed   By: Rosalene Colon M.D.   On:  11/16/2023 15:26     Signed: Jose Ngo, MD Internal Medicine Resident, PGY-1 [facility]  Pager: [phone] 3:41 PM, 11/18/2023

## 2023-11-18 NOTE — Discharge Instructions (Addendum)
 Ms. Louvella, Bentham you are admitted into the hospital because you had a fall.  He did not have an acute intracranial bleed based on imaging.  You have been more confused and weak lately which could have been because of your urinary tract infection.  We are giving you an antibiotic called cefuroxime  for you to take.  We have also placed a referral so that social work can help you establish hospice care at home.  She will be giving you instructions for to follow after this.   You have end-stage renal disease, and you came in with a high potassium, low calcium  and high phosphorus levels.  We are discharging you on a medication called PhosLo  which she will need to take 3 times a day with your meals.  You will need close follow-up with Dr. Broadus Canes in a week to follow-up on your blood levels.  You have an appointment on 8 May at 9:45 AM.  You have any questions please do not hesitate to call us  at [phone].  Thank you for allowing us  to take care of you.  Sincerely,  Your internal medicine team

## 2023-11-18 NOTE — TOC Transition Note (Signed)
 Transition of Care [facility]) - Discharge Note   Patient Details  Name: Gina Bailey MRN: [id number] Date of Birth: 12/17/1945  Transition of Care [facility]) CM/SW Contact:  Tom-Radu, Hermenegildo Clausen Daphne, RN Phone Number: 11/18/2023, 4:31 PM   Clinical Narrative:     Patient is scheduled fro discharge home today. Daughter at bedside and aware of discharge, awaiting son, [name] to transport home.  [facility] will f/u for Hospice services at home.  No further TOC needs oted.         Patient Goals and CMS Choice            Discharge Placement                       Discharge Plan and Services Additional resources added to the After Visit Summary for                                       Social Drivers of Health (SDOH) Interventions SDOH Screenings   Food Insecurity: No Food Insecurity (11/10/2023)  Housing: Low Risk  (11/10/2023)  Transportation Needs: No Transportation Needs (11/10/2023)  Utilities: Patient Declined (11/17/2023)  Alcohol Screen: Low Risk  (11/10/2023)  Depression (PHQ2-9): Low Risk  (12/10/2022)  Financial Resource Strain: Low Risk  (11/10/2023)  Physical Activity: Inactive (11/10/2023)  Social Connections: Socially Isolated (11/10/2023)  Stress: No Stress Concern Present (11/10/2023)  Tobacco Use: Medium Risk (11/17/2023)  Health Literacy: Inadequate Health Literacy (11/10/2023)     Readmission Risk Interventions     No data to display

## 2023-11-18 NOTE — TOC CM/SW Note (Signed)
 Transition of Care [facility]) - Inpatient Brief Assessment   Patient Details  Name: Gina Bailey MRN: [id number] Date of Birth: 30-Mar-1946  Transition of Care [facility]) CM/SW Contact:    Tom-Hingle, Arsenio Schnorr Daphne, RN Phone Number: 11/18/2023, 2:58 PM   Clinical Narrative:  Patient presented to the ED after being found in her bathroom, wedged into the wall. Patient's son is in the adjacent room and heard a loud noise, fall.  Labs showed worsening Renal function with significant Hypocalcemia. Per note, patient elected to forego hemodialysis.   CM spoke with patient and daughter, Gina Bailey at bedside. Gina Bailey verified patient not undergoing dialysis at this time, verbalized patient will be going home with Hospice services by [facility] at discharge.  CM called and left secure return call message for patient's son, Gina Bailey.   CM will continue to follow.        Transition of Care Asessment: Insurance and Status: Insurance coverage has been reviewed Patient has primary care physician: Yes Home environment has been reviewed: Yes Prior level of function:: Modified Independent Prior/Current Home Services: No current home services Social Drivers of Health Review: SDOH reviewed no interventions necessary Readmission risk has been reviewed: Yes Transition of care needs: transition of care needs identified, TOC will continue to follow

## 2023-11-18 NOTE — Evaluation (Signed)
 Occupational Therapy Evaluation Patient Details Name: Gina Bailey MRN: [id number] DOB: 1945-08-09 Today's Date: 11/18/2023   History of Present Illness   Pt is a 78 y.o. female presenting 4/29 after fall at home. Found to have worsening renal function, hypocalcemia. PMH significant for ESRD, HTN, DMII, dementia     Clinical Impressions Pt resting comfortably in bed, no complaints, son/grandchild present during session. Pt lives with son in 1 story home, level entry, son works during the day. PLOF mod I for basic ADLs, needs help with cooking/cleaning, sometimes min A for dressing or getting out of bed. Pt likely close to baseline at this time, demos mild decreased activity tolerance, currently at supervision/set up level using RW for ADLs/mobility. Pt ambulated down hall and back with 2 standing rest breaks. Pt has no further acute care needs but would definitely benefit from [facility] follow up to maximize safety and functional independence in home setting as they report they have not had home therapy before. Pt likely would benefit from shower chair/tub bench but they plan to order online if needed upon return home.      If plan is discharge home, recommend the following:   A little help with walking and/or transfers;A little help with bathing/dressing/bathroom;Assistance with cooking/housework;Assist for transportation     Functional Status Assessment   Patient has had a recent decline in their functional status and demonstrates the ability to make significant improvements in function in a reasonable and predictable amount of time.     Equipment Recommendations   None recommended by OT     Recommendations for Other Services         Precautions/Restrictions   Precautions Precautions: Fall Recall of Precautions/Restrictions: Impaired Restrictions Weight Bearing Restrictions Per Provider Order: No     Mobility Bed Mobility Overal bed mobility: Needs Assistance Bed  Mobility: Supine to Sit, Sit to Supine     Supine to sit: Min assist Sit to supine: Min assist   General bed mobility comments: at baseline, CGA to min A for in/out of bed    Transfers Overall transfer level: Needs assistance Equipment used: Rolling walker (2 wheels) Transfers: Sit to/from Stand, Bed to chair/wheelchair/BSC Sit to Stand: Supervision     Step pivot transfers: Supervision     General transfer comment: supervision for safety      Balance Overall balance assessment: Needs assistance Sitting-balance support: No upper extremity supported, Feet supported Sitting balance-Leahy Scale: Fair     Standing balance support: Bilateral upper extremity supported, During functional activity Standing balance-Leahy Scale: Fair Standing balance comment: able to stand with RW or bathroom sink for support, no LOB                           ADL either performed or assessed with clinical judgement   ADL Overall ADL's : Needs assistance/impaired;At baseline Eating/Feeding: Set up;Sitting   Grooming: Set up;Contact guard assist;Standing   Upper Body Bathing: Set up;Sitting;Standing   Lower Body Bathing: Set up;With adaptive equipment;Sitting/lateral leans;Sit to/from stand   Upper Body Dressing : Set up;Sitting   Lower Body Dressing: Set up;Supervision/safety;Minimal assistance;Sit to/from stand   Toilet Transfer: Supervision/safety;Rolling walker (2 wheels)   Toileting- Clothing Manipulation and Hygiene: Set up;Supervision/safety;Sit to/from stand       Functional mobility during ADLs: Supervision/safety;Rolling walker (2 wheels) General ADL Comments: Pt likely close to baseline, set up/supervision for most ADLs and mobility with RW, min A at times for LB dressing.  Vision         Perception         Praxis         Pertinent Vitals/Pain Pain Assessment Pain Assessment: No/denies pain     Extremity/Trunk Assessment Upper Extremity  Assessment Upper Extremity Assessment: Overall WFL for tasks assessed   Lower Extremity Assessment Lower Extremity Assessment: Defer to PT evaluation       Communication Communication Communication: No apparent difficulties   Cognition Arousal: Alert Behavior During Therapy: WFL for tasks assessed/performed Cognition: History of cognitive impairments             OT - Cognition Comments: history of dementia, not oriented to situation or time, but overall does well and able to fully participate, pleasant demeanor.                 Following commands: Intact       Cueing  General Comments   Cueing Techniques: Verbal cues      Exercises     Shoulder Instructions      Home Living Family/patient expects to be discharged to:: Private residence Living Arrangements: Children;Other relatives Available Help at Discharge: Family;Available 24 hours/day Type of Home: House Home Access: Level entry     Home Layout: One level     Bathroom Shower/Tub: Tub/shower unit         Home Equipment: Agricultural consultant (2 wheels)   Additional Comments: Pt lives at home with son who works during the day. Pt has extensive family who can assist nearly 24/7 if needed.      Prior Functioning/Environment Prior Level of Function : Needs assist             Mobility Comments: RW ADLs Comments: needs help with IADLs, min A for dressing at times    OT Problem List: Decreased strength;Decreased range of motion;Decreased activity tolerance;Impaired balance (sitting and/or standing);Decreased cognition   OT Treatment/Interventions:        OT Goals(Current goals can be found in the care plan section)   Acute Rehab OT Goals Patient Stated Goal: to return home OT Goal Formulation: With patient/family Time For Goal Achievement: 12/02/23 Potential to Achieve Goals: Good   OT Frequency:       Co-evaluation              AM-PAC OT "6 Clicks" Daily Activity     Outcome  Measure Help from another person eating meals?: A Little Help from another person taking care of personal grooming?: A Little Help from another person toileting, which includes using toliet, bedpan, or urinal?: A Little Help from another person bathing (including washing, rinsing, drying)?: A Little Help from another person to put on and taking off regular upper body clothing?: A Little Help from another person to put on and taking off regular lower body clothing?: A Little 6 Click Score: 18   End of Session Equipment Utilized During Treatment: Gait belt;Rolling walker (2 wheels) Nurse Communication: Mobility status  Activity Tolerance: Patient tolerated treatment well Patient left: in bed;with call bell/phone within reach;with bed alarm set;with family/visitor present  OT Visit Diagnosis: Unsteadiness on feet (R26.81);Other abnormalities of gait and mobility (R26.89);Muscle weakness (generalized) (M62.81);Other symptoms and signs involving cognitive function                Time: 1251-1315 OT Time Calculation (min): 24 min Charges:  OT General Charges $OT Visit: 1 Visit OT Evaluation $OT Eval Low Complexity: 1 Low OT Treatments $Self Care/Home Management : 8-22  mins  [place], OTR/L   Scherry Curtis 11/18/2023, 1:26 PM

## 2023-11-18 NOTE — Progress Notes (Signed)
   HD#1 SUBJECTIVE:  Patient Summary: Gina Bailey is a 78 y.o. with a pertinent PMH of ESRD not on HD, T2DM, HTN, who presented with a fall and admitted for ESRD and hypocalcemia.   Overnight Events: No acute events overnight  Interim History: She states she is feeling well overall.  She is not having any abdominal pain or difficulty urinating.  Denies any cramping.  Denies any tingling around her mouth.  OBJECTIVE:  Vital Signs: Vitals:   11/17/23 1712 11/17/23 2039 11/18/23 0500 11/18/23 0743  BP: (!) 151/74 (!) 153/85 (!) 150/70 (!) 152/74  Pulse: 80 79 79 79  Resp: 18 19 17    Temp: 98 F (36.7 C) 98.5 F (36.9 C) 98.1 F (36.7 C) 98.9 F (37.2 C)  TempSrc:  Oral Oral Oral  SpO2: 99% 99% 100% 99%   Supplemental O2: Room Air SpO2: 99 %  There were no vitals filed for this visit.   Intake/Output Summary (Last 24 hours) at 11/18/2023 1152 Last data filed at 11/17/2023 1800 Gross per 24 hour  Intake 480 ml  Output --  Net 480 ml   Net IO Since Admission: 480 mL [11/18/23 1152]  Physical Exam: General: Pleasant, chronically ill appearing elderly female laying in bed, NAD CV: RRR. Systolic murmur appreciated Pulmonary: Normal effort on room air Abdominal: No TTP, no CVA tenderness Skin: Warm and dry. No obvious rash or lesions. Neuro: Alert and oriented to self and place. CN 2-12 intact. Negative pronator drift. Psych: Normal mood and affect  ASSESSMENT/PLAN:  Assessment: Principal Problem:   Hypocalcemia Active Problems:   Chronic kidney disease   Fall   Plan: ESRD not on HD Hypocalcemia Hyperphosphatemia AGMA Hypocalcemic today corrected calcium  around 8.  Asymptomatic.  Phosphate is improving.  Will continue to monitor and give her IV gluconate today.  Family okay with moving to home hospice.  Appreciate TOC. - IV calcium  gluconate 1000mg  today - renvela  800 mg TID - TUMS 200 mg BID - sodium bicarb 650 mg daily - calcitriol  0.25 mcg daily  - Renal  diet (low phos diet)  Fall on thinners Fell in her bathroom, but trauma workup was negative. Some slurring of the speech noted by family.  But neurologically intact, pronator drift negative.  PT/OT consulted.   Goals of care Vascular and neurodegenerative dementia As noted above hospice at home elected by patient.  TOC working on this.  Paroxysmal Afib HTN In sinus rhythm now. Eliquis  was reduced from 5 mg BID to 2.5 mg BID given elevated HAS-BLED score and also since she is almost 78 yrs old with advanced kidney disease.  Can consider talking to primary care doctor outpatient to determine if they would like to continue using anticoagulation. - Eliquis  2.5 mg bid - Coreg  6.25 mg bid - Amlodipine  10 mg daily   Normocytic anemia Likely anemia of chronic disease. Hb is 9.1 today from 10.2 yesterday, but no obvious source of bleeding seen.   Best Practice: Diet: Renal diet IVF: Fluids: none VTE: apixaban  (ELIQUIS ) tablet 2.5 mg Start: 11/16/23 2200 Code: DNR/DNI Family Contact: Son, Colburn,  DISPO: Anticipated discharge to Home pending  Hospice set-up .  Signature: Jose Ngo, MD Internal Medicine Resident, PGY-1 [facility] 11:52 AM, 11/18/2023   Please contact the on call pager  at [phone].

## 2023-11-18 NOTE — Progress Notes (Signed)
 [facility] [phone] [facility] Liaison Note  Received request from [place], [place] for hospice services at home after discharge. Spoke with daughter, Gina Bailey, to initiate education related to hospice philosophy, services and team approach to care.  Per discussion, the patient is medically ready for dc today, however, the son is not answering phone and the daughter is not comfortable taking patient to his home without his consent.  DME needs discussed. Admission RN will evaluate for DME needs at admission visit.  Please send completed and signed DNR with patient at discharge.  Please provide prescriptions at discharge as needed to ensure ongoing symptom management.  Please call with any questions or concerns.  Thank you for the opportunity to participate in this patients care.  Lestine Rathke, BSN, [employer] [phone]

## 2023-11-19 ENCOUNTER — Inpatient Hospital Stay (HOSPITAL_COMMUNITY)
Admission: RE | Admit: 2023-11-19 | Discharge: 2023-11-19 | Disposition: A | Discharge status: Home / Self Care | Source: Ambulatory Visit | Attending: Nephrology | Admitting: Nephrology

## 2023-11-19 ENCOUNTER — Other Ambulatory Visit (HOSPITAL_COMMUNITY): Payer: Self-pay

## 2023-11-19 DIAGNOSIS — D638 Anemia in other chronic diseases classified elsewhere: Secondary | ICD-10-CM | POA: Diagnosis not present

## 2023-11-19 DIAGNOSIS — N186 End stage renal disease: Secondary | ICD-10-CM | POA: Diagnosis not present

## 2023-11-19 DIAGNOSIS — Z992 Dependence on renal dialysis: Secondary | ICD-10-CM | POA: Diagnosis not present

## 2023-11-19 NOTE — Progress Notes (Signed)
 Gina Bailey to be discharged Home per MD order. Discussed prescriptions and follow up appointments with the patient. Prescriptions ist explained in detail with patient's son, verbalized understanding.  Skin clean, dry and intact without evidence of skin break down, no evidence of skin tears noted. IV catheter discontinued intact. Site without signs and symptoms of complications. Dressing and pressure applied. Pt denies pain at the site currently. No complaints noted.  Patient free of lines, drains, and wounds.   An After Visit Summary (AVS) was printed and given to the patient. Patient escorted via wheelchair, and discharged home via private auto.  Deberah Falconer, RN

## 2023-11-19 NOTE — Discharge Summary (Addendum)
 Name: Gina Bailey MRN: [id number] DOB: [birth year] 78 y.o. PCP: Sherol Dixie, MD  Date of Admission: 11/16/2023  2:34 PM Date of Discharge:  5//2025 Attending Physician: Dr.  Lelia Putnam  DISCHARGE DIAGNOSIS:  Primary Problem: Hypocalcemia   Hospital Problems: Principal Problem:   Hypocalcemia Active Problems:   Chronic kidney disease   Fall    DISCHARGE MEDICATIONS:   Allergies as of 11/19/2023       Reactions   Metformin  And Related Other (See Comments)   Could not tolerate metformin  due to GI side effects   Pineapple Itching, Swelling   Zestril  [lisinopril ] Swelling        Medication List     TAKE these medications    acetaminophen  325 MG tablet Commonly known as: TYLENOL  Take 1-2 tablets (325-650 mg total) by mouth every 4 (four) hours as needed for mild pain.   amLODipine  10 MG tablet Commonly known as: NORVASC  Take 1 tablet (10 mg total) by mouth daily.   calcitRIOL  0.25 MCG capsule Commonly known as: ROCALTROL  Take 1 capsule (0.25 mcg total) by mouth daily. What changed: when to take this   calcium  acetate 667 MG capsule Commonly known as: PHOSLO  Take 1 capsule (667 mg total) by mouth 3 (three) times daily with meals.   carvedilol  6.25 MG tablet Commonly known as: COREG  Take 1 tablet (6.25 mg total) by mouth 2 (two) times daily with a meal.   cefUROXime  250 MG tablet Commonly known as: CEFTIN  Take 1 tablet (250 mg total) by mouth daily with supper.   docusate sodium  100 MG capsule Commonly known as: COLACE Take 100 mg by mouth daily.   Eliquis  2.5 MG Tabs tablet Generic drug: apixaban  Take 1 tablet (2.5 mg total) by mouth 2 (two) times daily. What changed:  medication strength how much to take how to take this when to take this additional instructions   furosemide  80 MG tablet Commonly known as: LASIX  Take 1 tablet (80 mg total) by mouth 3 (three) times a week. M/W/F as needed for swellling per Dr. Yvonnie Heritage What changed:  when  to take this additional instructions   rosuvastatin  20 MG tablet Commonly known as: CRESTOR  Take 1 tablet (20 mg total) by mouth daily.        DISPOSITION AND FOLLOW-UP:  [name] was discharged from [facility] in [place] condition. At the hospital follow up visit please address:  Follow-up Recommendations: Consults: Neprho, Hospice Labs: Kidney Profile Medications: Phoslo   Follow-up Appointments:   PCP 11/25/2023 2:45PM  HOSPITAL COURSE:  Patient Summary: Gina Bailey is a 78 y.o. female with a past medical history of ESRD, hypertension, type 2 diabetes mellitus who presents after having a fall at home.  Patient found to have worsening renal function, was kyperkalemic, hyperphosphatemic, and hypocalcemic. She received 3g of IV calcium  gluconate, TUMS, and sevelamer  during her hospitalization. Her Calcium  corrected is 8.2 at discharge. K WNL, Phosphorus decreased from >30 to 11. Being discharged with phoslo  667mg  TID with meals and Calcitriol  0.25mcg daily. She has been largely asymptomatic.   -Please consider following up with an RFP at FU    Goals of care  Discussed with given her general deconditioning and worsening kidney function, that hospice would be an appropriate option for him.  Son has been currently overwhelmed but not willing to fully let go of her care.  He is open to having hospice at home.  TOC was consulted to help with the transition. -Please follow-up  and make sure that he has been set up with hospice care.   Deconditioning UTI Gina Bailey in her bathroom, on Eliquis  for her Afib. Trauma workup was negative. Unclear mechanism of fall, but likely due to poor mobility in the setting of advanced dementia and postural instability.  UTI had rare bacteria and leukocytosis.  She was tender to palpation in the suprapubic region at admission.  She was given 1 dose of ceftriaxone  and is discharged on cefuroxime .  - PT/OT    Anion gap metabolic  acidosis Likely secondary to ESRD. Will check a lactic. Gave sodium bicarb 650 mg once.  Please continue to follow-up outpatient and consider bicarbonate supplementation if needed.   Normocytic anemia, likely secondary to chronic disease Hemoglobin 10.5, stable around baseline. Normocytic. Likely anemia of chronic disease in the setting of kidney disease.     Chronic conditions: Hypertension Blood pressure elevated at 164/82 on presentation.  Asymptomatic from a hypertension standpoint.  ESRD likely contributory.  On amlodipine  10 mg, carvedilol  6.25 mg twice daily. Continued these medications.    Paroxysmal atrial fibrillation Sinus rhythm on presentation.  Takes Eliquis  5 mg twice daily at home for anticoagulation.  Hemodynamically stable.  - Eliquis  dose reduced to 2.5 mg BID given age and creatinine   Mixed vascular and neurodegenerative dementia Baseline poor memory, disoriented, cannot manage her ADLs alone. She has been living with her son/primary caregiver. No acute changes.    Type 2 diabetes mellitus Well-controlled. Hemoglobin A1c was 5.4 one year ago. Not on any antiglycemic agents. On crestor  20 mg daily, continued.     DISCHARGE INSTRUCTIONS:   Discharge Instructions     Call MD for:  difficulty breathing, headache or visual disturbances   Complete by: As directed    Call MD for:  extreme fatigue   Complete by: As directed    Call MD for:  hives   Complete by: As directed    Call MD for:  persistant dizziness or light-headedness   Complete by: As directed    Call MD for:  persistant nausea and vomiting   Complete by: As directed    Call MD for:  severe uncontrolled pain   Complete by: As directed    Call MD for:  temperature >100.4   Complete by: As directed    Diet - low sodium heart healthy   Complete by: As directed    Increase activity slowly   Complete by: As directed        SUBJECTIVE:  She states she is feeling well overall. She is not having any  abdominal pain or difficulty urinating. Denies any cramping. Denies any tingling around her mouth  Discharge Vitals:   BP (!) 149/78 (BP Location: Right Arm)   Pulse 76   Temp (!) 97.3 F (36.3 C) (Oral)   Resp 18   LMP 10/22/1978   SpO2 100%   OBJECTIVE:  General: Pleasant, chronically ill appearing elderly female laying in bed, NAD CV: RRR. Systolic murmur appreciated Pulmonary: Normal effort on room air Abdominal: No TTP, no CVA tenderness Skin: Warm and dry. No obvious rash or lesions. Neuro: Alert and oriented to self and place.  Psych: Normal mood and affect  Pertinent Labs, Studies, and Procedures:     Latest Ref Rng & Units 11/18/2023    5:21 AM 11/17/2023    4:00 AM 11/16/2023    3:07 PM  CBC  WBC 4.0 - 10.5 K/uL 6.7  7.9    Hemoglobin 12.0 - 15.0  g/dL 8.5  9.1  47.8   Hematocrit 36.0 - 46.0 % 27.3  29.6  31.0   Platelets 150 - 400 K/uL 146  149         Latest Ref Rng & Units 11/18/2023    5:21 AM 11/17/2023   12:21 PM 11/17/2023    4:00 AM  CMP  Glucose 70 - 99 mg/dL 86  81  74   BUN 8 - 23 mg/dL 83  81  81   Creatinine 0.44 - 1.00 mg/dL 29.56  21.30  86.57   Sodium 135 - 145 mmol/L 142  143  143   Potassium 3.5 - 5.1 mmol/L 4.1  4.3  4.5   Chloride 98 - 111 mmol/L 110  109  109   CO2 22 - 32 mmol/L 15  15  14    Calcium  8.9 - 10.3 mg/dL 7.0  6.7  6.6     CT CERVICAL SPINE WO CONTRAST Result Date: 11/16/2023 CLINICAL DATA:  Blunt trauma, fell EXAM: CT CERVICAL SPINE WITHOUT CONTRAST TECHNIQUE: Multidetector CT imaging of the cervical spine was performed without intravenous contrast. Multiplanar CT image reconstructions were also generated. RADIATION DOSE REDUCTION: This exam was performed according to the departmental dose-optimization program which includes automated exposure control, adjustment of the mA and/or kV according to patient size and/or use of iterative reconstruction technique. COMPARISON:  None Available. FINDINGS: Alignment: Straightening of the  cervical spine due to multilevel degenerative changes. Otherwise alignment is anatomic. Skull base and vertebrae: No acute fracture. No primary bone lesion or focal pathologic process. Soft tissues and spinal canal: No prevertebral fluid or swelling. No visible canal hematoma. Disc levels: There is moderate diffuse spondylosis throughout the cervical spine, with prominent anterior osteophytes noted from C4-5 through C7-T1. Diffuse facet hypertrophy, left predominant at C2-3, C3-4, and C4-5. Left predominant neural foraminal encroachment at C3-4 and C4-5. Upper chest: Airway is patent. Visualized portions of the lung apices are clear. Other: Reconstructed images demonstrate no additional findings. IMPRESSION: 1. No acute cervical spine fracture. 2. Extensive multilevel cervical degenerative changes. Electronically Signed   By: Bobbye Burrow M.D.   On: 11/16/2023 16:11   CT MAXILLOFACIAL WO CONTRAST Result Date: 11/16/2023 CLINICAL DATA:  Gina Bailey, facial trauma EXAM: CT MAXILLOFACIAL WITHOUT CONTRAST TECHNIQUE: Multidetector CT imaging of the maxillofacial structures was performed. Multiplanar CT image reconstructions were also generated. RADIATION DOSE REDUCTION: This exam was performed according to the departmental dose-optimization program which includes automated exposure control, adjustment of the mA and/or kV according to patient size and/or use of iterative reconstruction technique. COMPARISON:  None Available. FINDINGS: Osseous: No fracture or mandibular dislocation. No destructive process. Orbits: Negative. No traumatic or inflammatory finding. Sinuses: Small osteoma within the left ethmoid air cells. Remaining paranasal sinuses are clear. Soft tissues: Negative. Limited intracranial: No significant or unexpected finding. IMPRESSION: 1. No acute facial bone fracture. Electronically Signed   By: Bobbye Burrow M.D.   On: 11/16/2023 16:09   CT HEAD WO CONTRAST Result Date: 11/16/2023 CLINICAL DATA:  Gina Bailey,  head trauma EXAM: CT HEAD WITHOUT CONTRAST TECHNIQUE: Contiguous axial images were obtained from the base of the skull through the vertex without intravenous contrast. RADIATION DOSE REDUCTION: This exam was performed according to the departmental dose-optimization program which includes automated exposure control, adjustment of the mA and/or kV according to patient size and/or use of iterative reconstruction technique. COMPARISON:  04/27/2022 FINDINGS: Brain: Chronic small vessel ischemic changes are seen within the periventricular white matter, basal ganglia, and right  thalamus. No evidence of acute infarct or hemorrhage. Lateral ventricles and remaining midline structures are unremarkable. No acute extra-axial fluid collections. No mass effect. Vascular: Stable atherosclerosis.  No hyperdense vessel. Skull: Large left frontal scalp hematoma. No underlying fracture. The remainder of the calvarium is unremarkable. Sinuses/Orbits: No acute finding. Other: None. IMPRESSION: 1. Large left frontal scalp hematoma.  No underlying fracture. 2. No acute intracranial process. 3. Stable chronic small vessel ischemic changes. Electronically Signed   By: Bobbye Burrow M.D.   On: 11/16/2023 16:07   DG Pelvis Portable Result Date: 11/16/2023 CLINICAL DATA:  Multiple falls. EXAM: PORTABLE PELVIS 1-2 VIEWS COMPARISON:  July 16, 2022. FINDINGS: There is no evidence of pelvic fracture or diastasis. No pelvic bone lesions are seen. IMPRESSION: Negative. Electronically Signed   By: Rosalene Colon M.D.   On: 11/16/2023 15:31   DG Chest Port 1 View Result Date: 11/16/2023 CLINICAL DATA:  Multiple falls. EXAM: PORTABLE CHEST 1 VIEW COMPARISON:  April 16, 2022. FINDINGS: Stable cardiomegaly. Both lungs are clear. The visualized skeletal structures are unremarkable. IMPRESSION: No active disease. Electronically Signed   By: Rosalene Colon M.D.   On: 11/16/2023 15:26     Signed: Jose Ngo, MD Internal  Medicine Resident, PGY-1 [facility]  Pager: [phone] 9:18 AM, 11/19/2023

## 2023-11-19 NOTE — TOC Transition Note (Signed)
 Transition of Care [facility]) - Discharge Note   Patient Details  Name: Gina Bailey MRN: [id number] Date of Birth: [birth year]  Transition of Care [facility]) CM/SW Contact:  Tom-Napierala, Angelique Ken, RN Phone Number: 11/19/2023, 9:22 AM   Clinical Narrative:     Patient scheduled for discharge today. Discharge order canceled yesterday. Patient's son, [name] at bedside and will transport patient at discharge.   No further TOC needs noted.       Patient Goals and CMS Choice            Discharge Placement                       Discharge Plan and Services Additional resources added to the After Visit Summary for                                       Social Drivers of Health (SDOH) Interventions SDOH Screenings   Food Insecurity: No Food Insecurity (11/10/2023)  Housing: Low Risk  (11/10/2023)  Transportation Needs: No Transportation Needs (11/10/2023)  Utilities: Patient Declined (11/17/2023)  Alcohol Screen: Low Risk  (11/10/2023)  Depression (PHQ2-9): Low Risk  (12/10/2022)  Financial Resource Strain: Low Risk  (11/10/2023)  Physical Activity: Inactive (11/10/2023)  Social Connections: Socially Isolated (11/10/2023)  Stress: No Stress Concern Present (11/10/2023)  Tobacco Use: Medium Risk (11/17/2023)  Health Literacy: Inadequate Health Literacy (11/10/2023)     Readmission Risk Interventions     No data to display

## 2023-11-22 ENCOUNTER — Telehealth: Payer: Self-pay

## 2023-11-22 NOTE — Transitions of Care (Post Inpatient/ED Visit) (Signed)
   11/22/2023  Name: Gina Bailey MRN: [id number] DOB: [birth year]  Today's TOC FU Call Status: Today's TOC FU Call Status:: Successful TOC FU Call Completed TOC FU Call Complete Date: 11/22/23 (Spoke with son, Gina Bailey - confirmed [facility] hospice services are in place - [facility] program not indicated) Patient's Name and Date of Birth confirmed.  Transition Care Management Follow-up Telephone Call Date of Discharge: 11/19/23 Discharge Facility: [facility]) Type of Discharge: Inpatient Admission Primary Inpatient Discharge Diagnosis:: Hypocalcemia/Fall     Medications Reviewed Today: Medications Reviewed Today   Medications were not reviewed in this encounter      Tonia Frankel RN, CCM [place]  VBCI-Population Health RN Care Manager [phone]

## 2023-11-25 ENCOUNTER — Encounter: Admitting: Internal Medicine

## 2023-12-01 ENCOUNTER — Other Ambulatory Visit: Payer: Self-pay

## 2023-12-03 ENCOUNTER — Inpatient Hospital Stay (HOSPITAL_COMMUNITY)
Admission: RE | Admit: 2023-12-03 | Discharge: 2023-12-03 | Disposition: A | Discharge status: Home / Self Care | Source: Ambulatory Visit | Attending: Nephrology | Admitting: Nephrology

## 2023-12-30 ENCOUNTER — Ambulatory Visit: Admitting: Internal Medicine

## 2024-03-08 ENCOUNTER — Encounter

## 2024-04-19 DEATH — deceased

## 2024-11-15 ENCOUNTER — Encounter
